# Patient Record
Sex: Female | Born: 1955 | Race: Black or African American | Hispanic: No | Smoking: Former smoker
Health system: Southern US, Community
[De-identification: ages and names within clinical notes are randomized; demographics above are authoritative.]

## PROBLEM LIST (undated history)

## (undated) ENCOUNTER — Emergency Department (HOSPITAL_COMMUNITY): Disposition: A | Discharge status: Home / Self Care | Payer: Medicare PPO

## (undated) DIAGNOSIS — K219 Gastro-esophageal reflux disease without esophagitis: Secondary | ICD-10-CM

## (undated) DIAGNOSIS — N189 Chronic kidney disease, unspecified: Secondary | ICD-10-CM

## (undated) DIAGNOSIS — N3010 Interstitial cystitis (chronic) without hematuria: Secondary | ICD-10-CM

## (undated) DIAGNOSIS — K3189 Other diseases of stomach and duodenum: Secondary | ICD-10-CM

## (undated) DIAGNOSIS — M7918 Myalgia, other site: Secondary | ICD-10-CM

## (undated) DIAGNOSIS — E669 Obesity, unspecified: Secondary | ICD-10-CM

## (undated) DIAGNOSIS — M722 Plantar fascial fibromatosis: Secondary | ICD-10-CM

## (undated) DIAGNOSIS — D509 Iron deficiency anemia, unspecified: Secondary | ICD-10-CM

## (undated) DIAGNOSIS — I2510 Atherosclerotic heart disease of native coronary artery without angina pectoris: Secondary | ICD-10-CM

## (undated) DIAGNOSIS — G5600 Carpal tunnel syndrome, unspecified upper limb: Secondary | ICD-10-CM

## (undated) DIAGNOSIS — M545 Low back pain, unspecified: Secondary | ICD-10-CM

## (undated) DIAGNOSIS — R109 Unspecified abdominal pain: Secondary | ICD-10-CM

## (undated) DIAGNOSIS — I10 Essential (primary) hypertension: Secondary | ICD-10-CM

## (undated) DIAGNOSIS — B3781 Candidal esophagitis: Secondary | ICD-10-CM

## (undated) DIAGNOSIS — E538 Deficiency of other specified B group vitamins: Secondary | ICD-10-CM

## (undated) DIAGNOSIS — B1920 Unspecified viral hepatitis C without hepatic coma: Secondary | ICD-10-CM

## (undated) DIAGNOSIS — H409 Unspecified glaucoma: Secondary | ICD-10-CM

## (undated) DIAGNOSIS — G8929 Other chronic pain: Secondary | ICD-10-CM

## (undated) DIAGNOSIS — K602 Anal fissure, unspecified: Secondary | ICD-10-CM

## (undated) DIAGNOSIS — E119 Type 2 diabetes mellitus without complications: Secondary | ICD-10-CM

## (undated) DIAGNOSIS — J45909 Unspecified asthma, uncomplicated: Secondary | ICD-10-CM

## (undated) DIAGNOSIS — E1169 Type 2 diabetes mellitus with other specified complication: Secondary | ICD-10-CM

## (undated) DIAGNOSIS — K922 Gastrointestinal hemorrhage, unspecified: Secondary | ICD-10-CM

## (undated) DIAGNOSIS — K319 Disease of stomach and duodenum, unspecified: Secondary | ICD-10-CM

## (undated) DIAGNOSIS — N390 Urinary tract infection, site not specified: Secondary | ICD-10-CM

## (undated) DIAGNOSIS — K589 Irritable bowel syndrome without diarrhea: Secondary | ICD-10-CM

## (undated) HISTORY — PX: BREAST SURGERY: SHX581

## (undated) HISTORY — PX: CHOLECYSTECTOMY: SHX55

## (undated) HISTORY — PX: REVISION UROSTOMY CUTANEOUS: SUR1282

## (undated) HISTORY — PX: ESOPHAGOGASTRODUODENOSCOPY: SHX1529

## (undated) HISTORY — PX: BILATERAL CARPAL TUNNEL RELEASE: SHX6508

## (undated) HISTORY — PX: COLONOSCOPY: SHX174

## (undated) HISTORY — PX: ABDOMINAL HYSTERECTOMY: SHX81

---

## 1997-10-27 ENCOUNTER — Encounter: Admission: RE | Admit: 1997-10-27 | Discharge: 1997-10-27 | Payer: Self-pay | Admitting: *Deleted

## 2007-05-02 ENCOUNTER — Emergency Department (HOSPITAL_COMMUNITY): Admission: EM | Admit: 2007-05-02 | Discharge: 2007-05-02 | Payer: Self-pay | Admitting: Certified Registered"

## 2007-05-02 IMAGING — CR DG HUMERUS 2V *R*
2 series · 2 of 2 positions shown · non-contrast
Comparison: none

CLINICAL DATA: Allergic reaction, pain in mid right humerus and swelling. 
RIGHT HUMERUS ? 2 VIEW:

[w humerus ap right *]
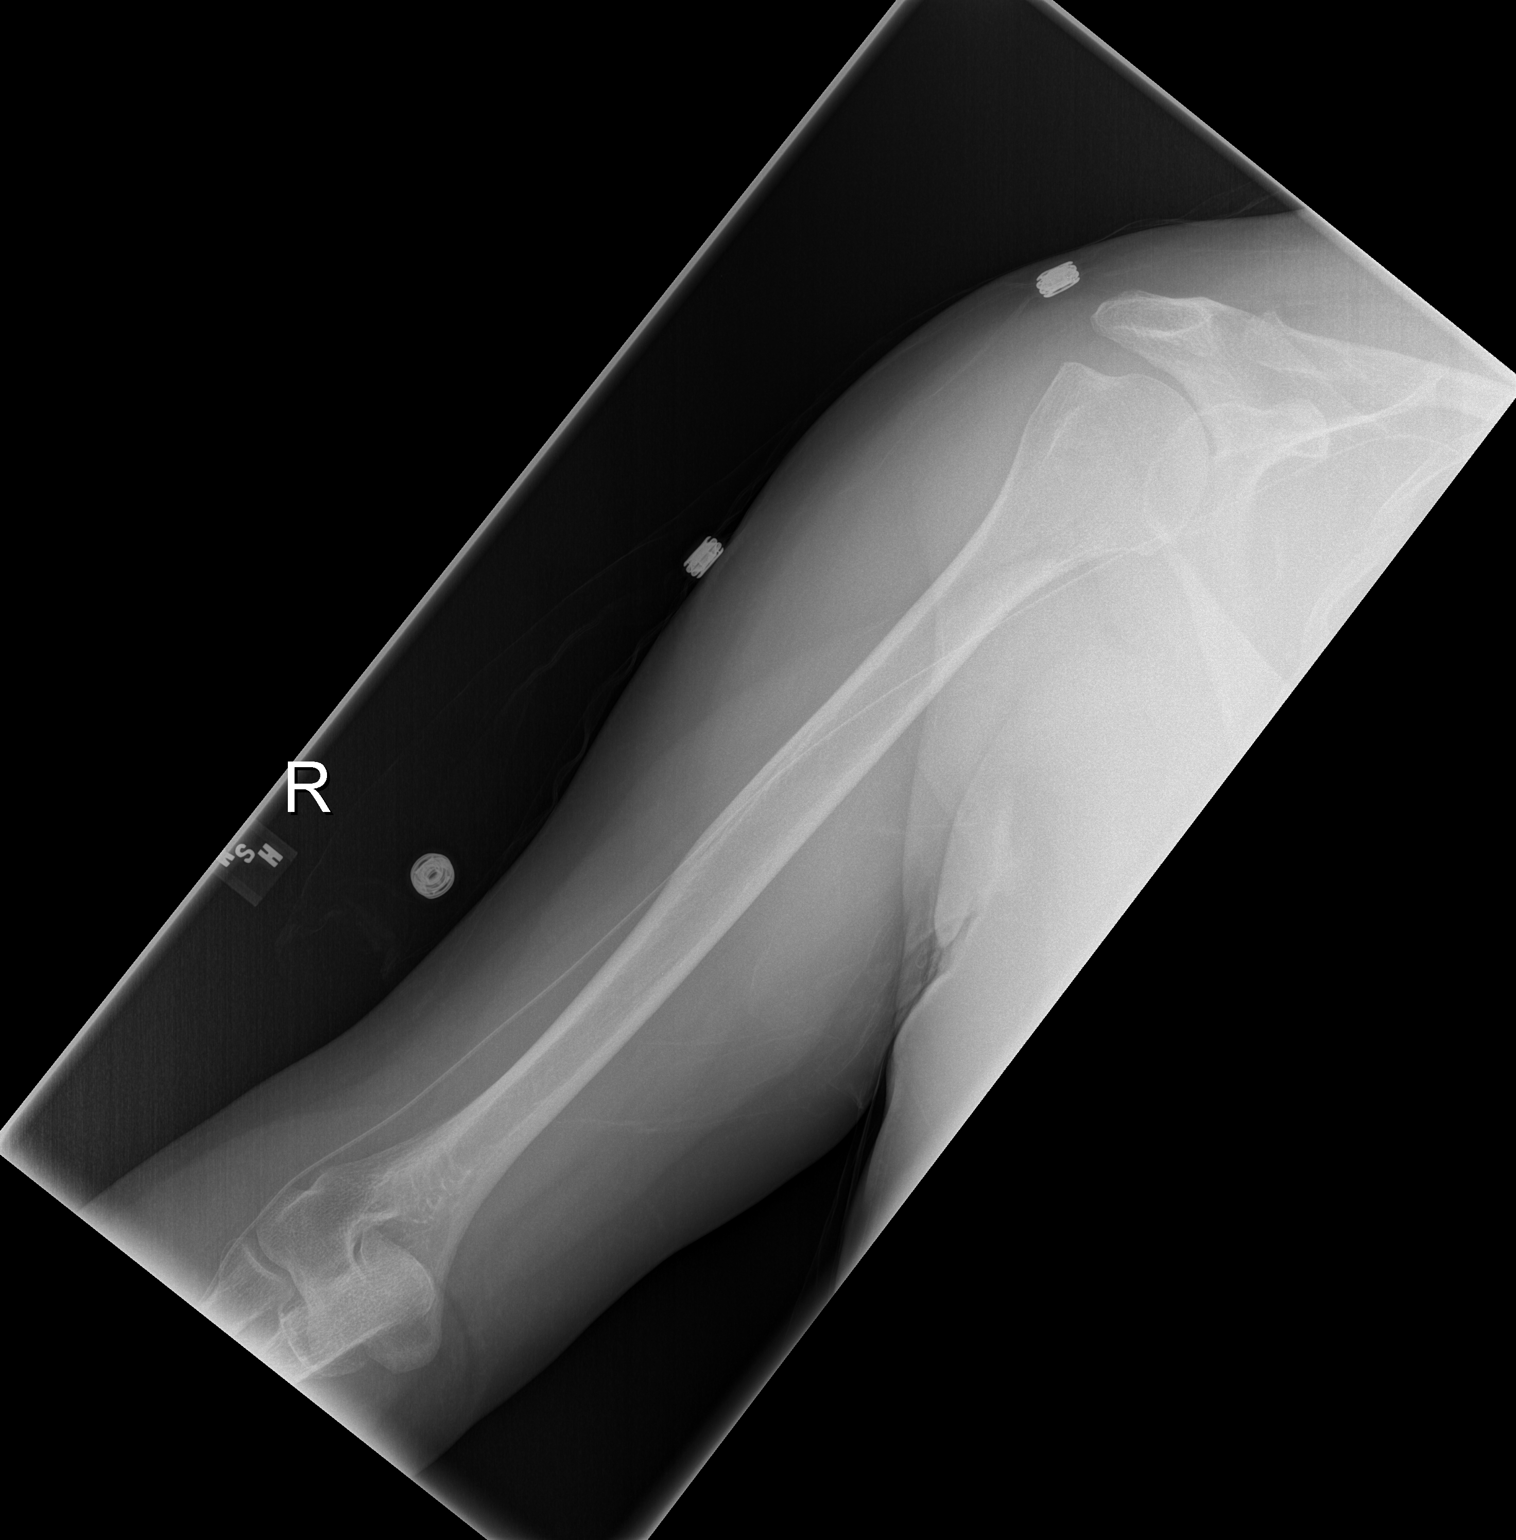

[w humerus lat right *]
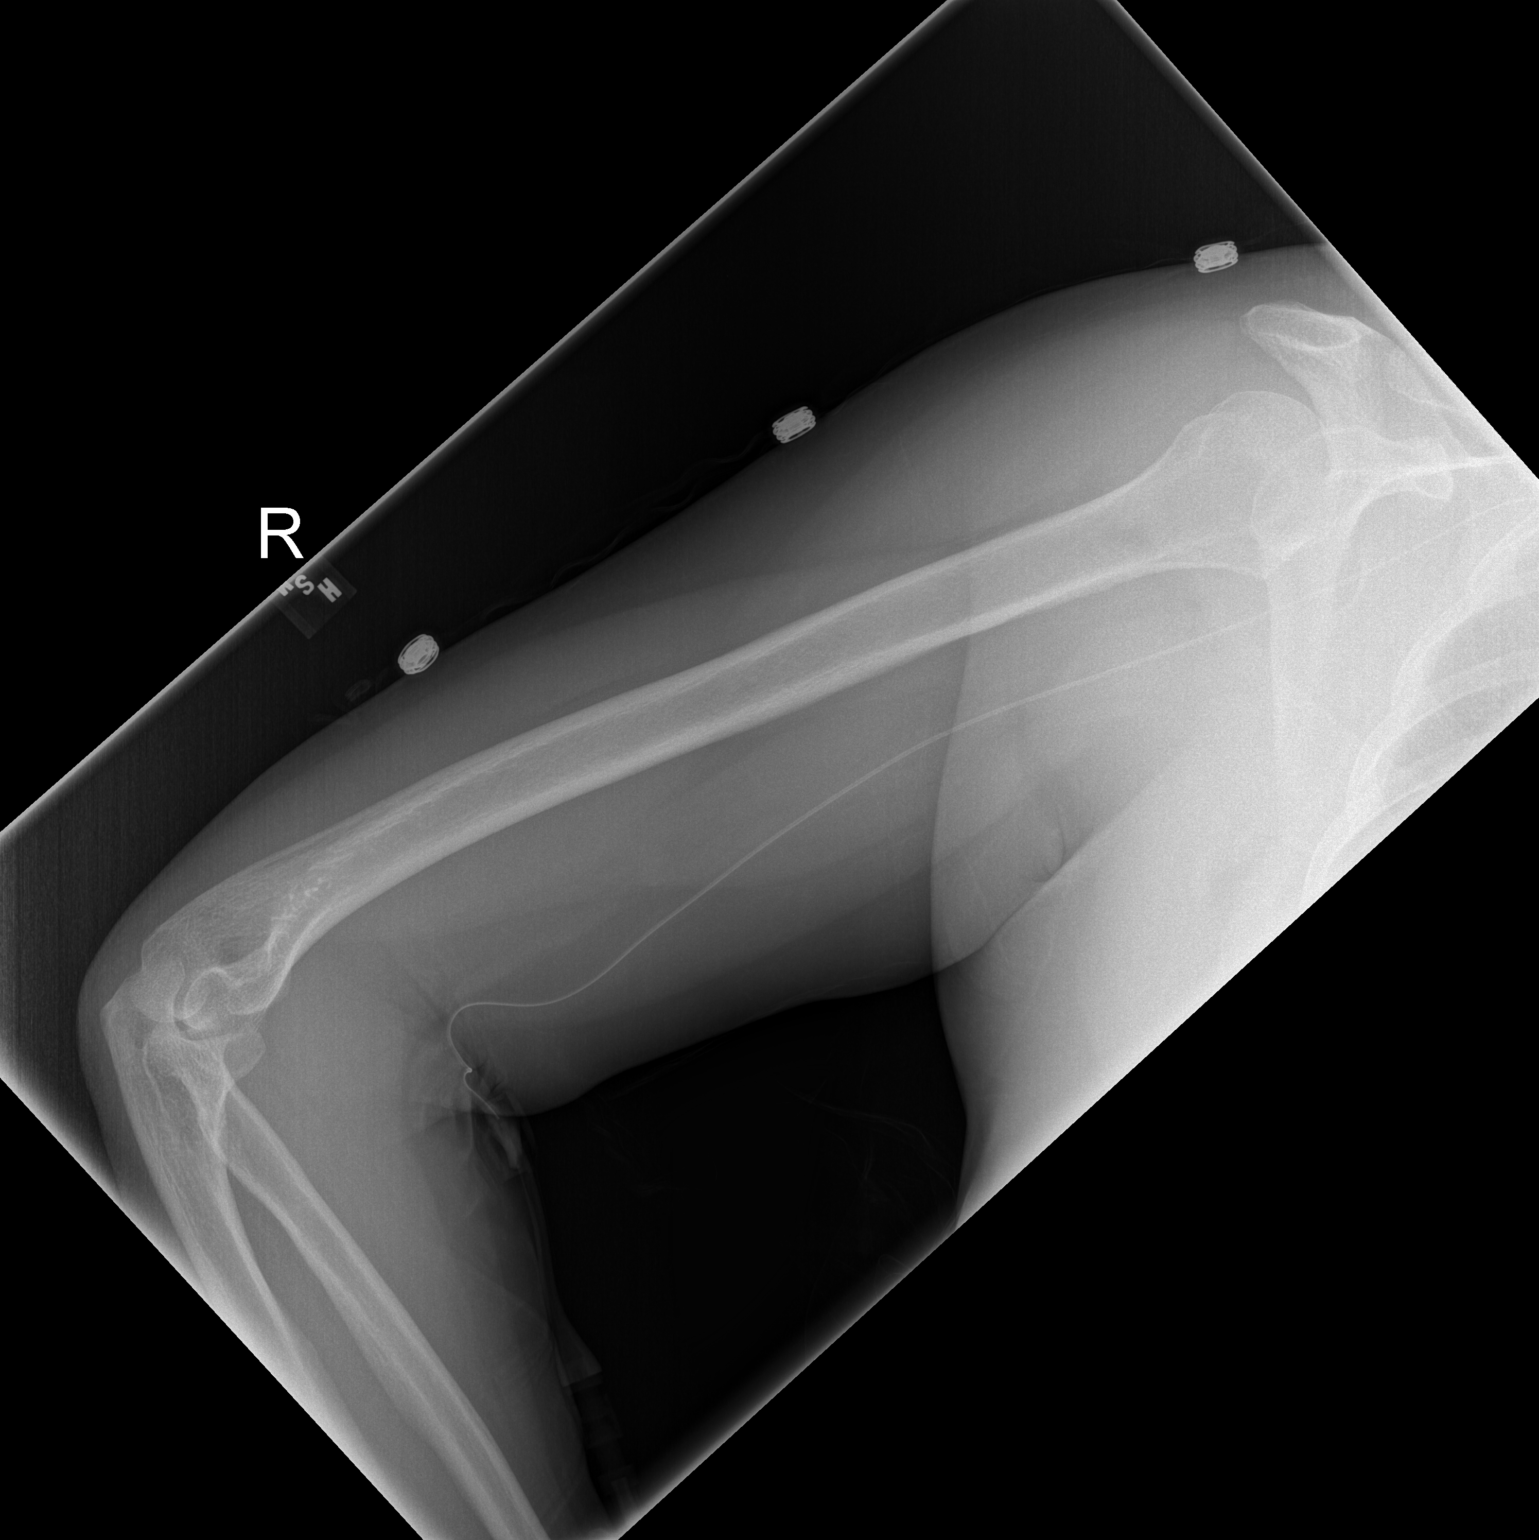

[2 of 2 positions shown; findings below may reference images not displayed]

FINDINGS: Two views of the right humerus show no acute fracture or dislocation.  A right subclavian PICC line is noted.
IMPRESSION: No right humerus acute fracture or subluxation.  Right arm / subclavian PICC line is noted.

## 2008-02-05 IMAGING — CR DG CHEST 2V
2 series · 2 of 2 positions shown · non-contrast
Comparison: There are no prior studies for comparison.

CLINICAL DATA: Allergic reaction, pain.  Shortness of breath 
 CHEST - 2 VIEW ? [DATE]:

[w chest pa]
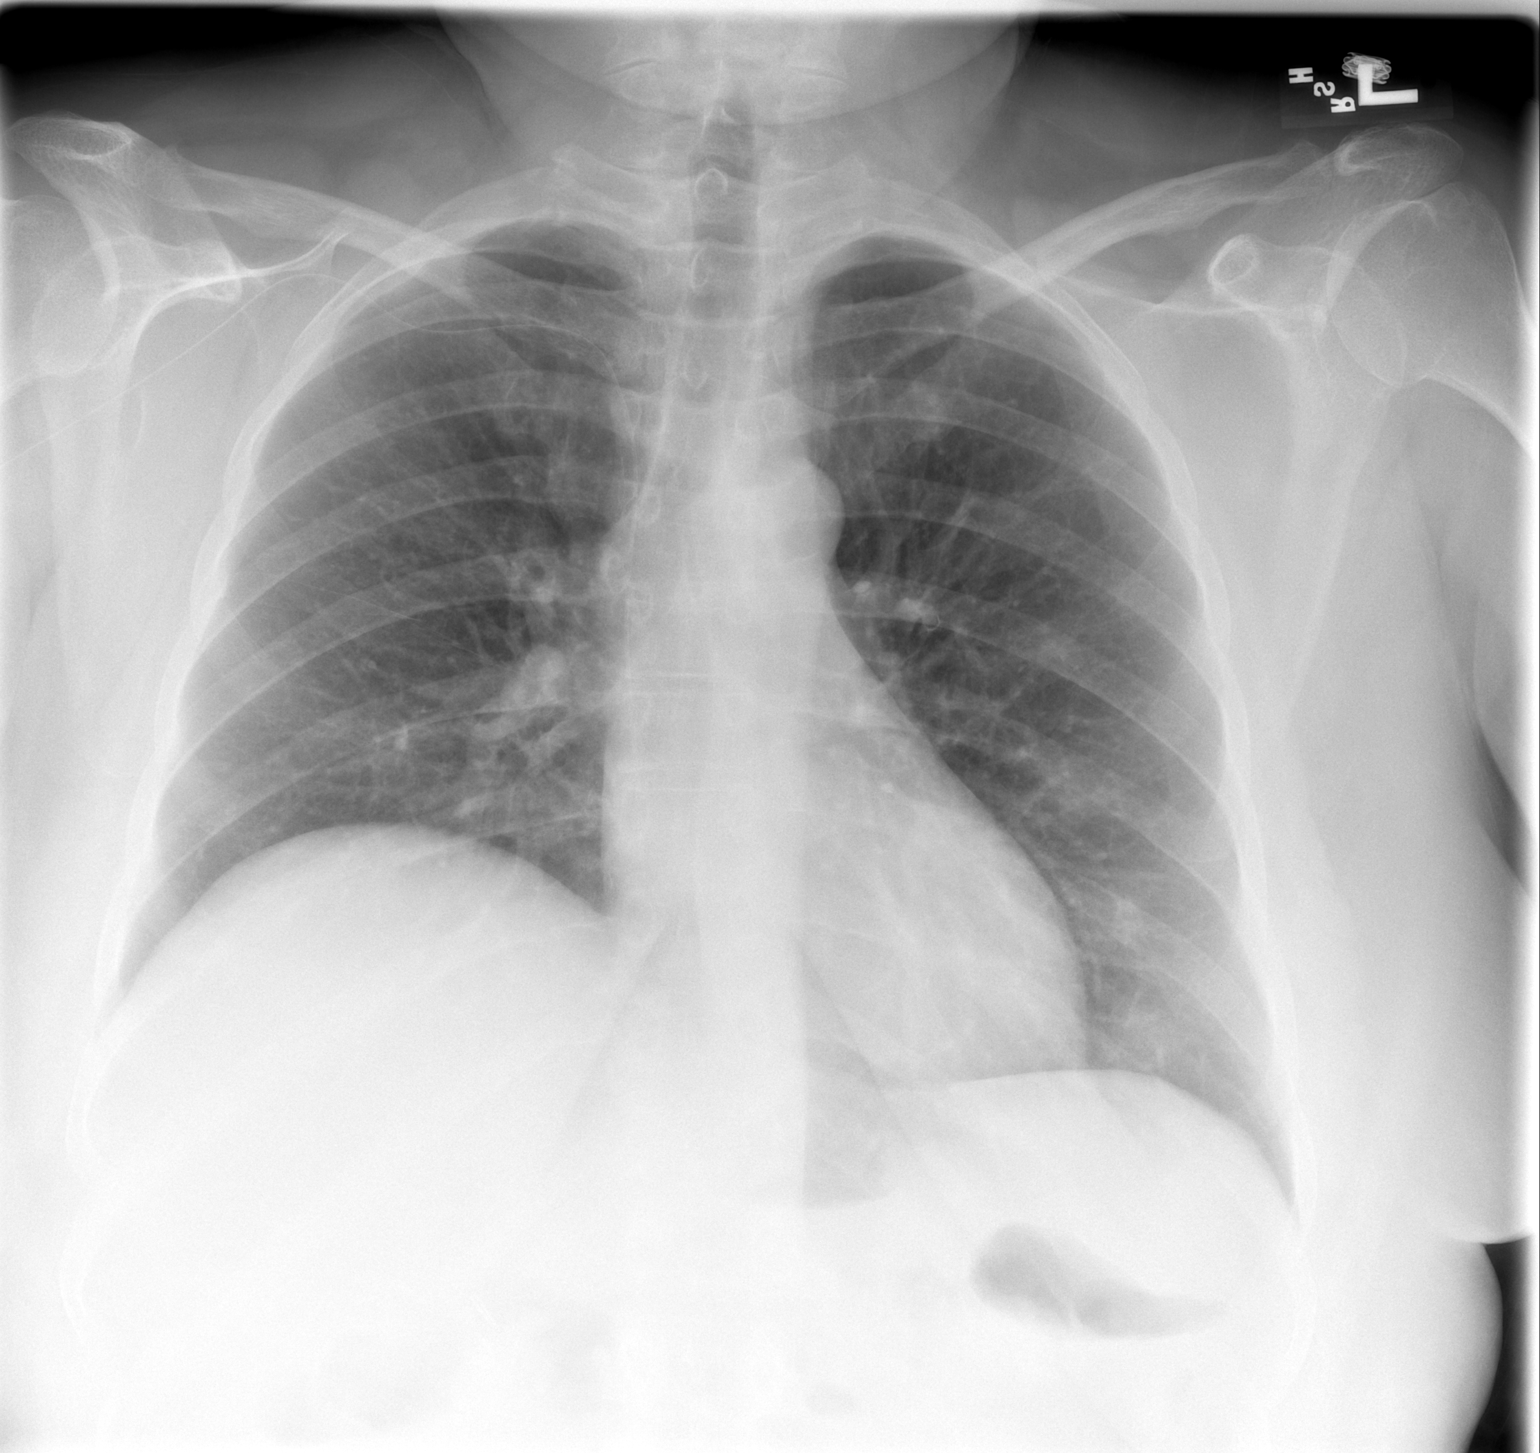

[w chest lat]
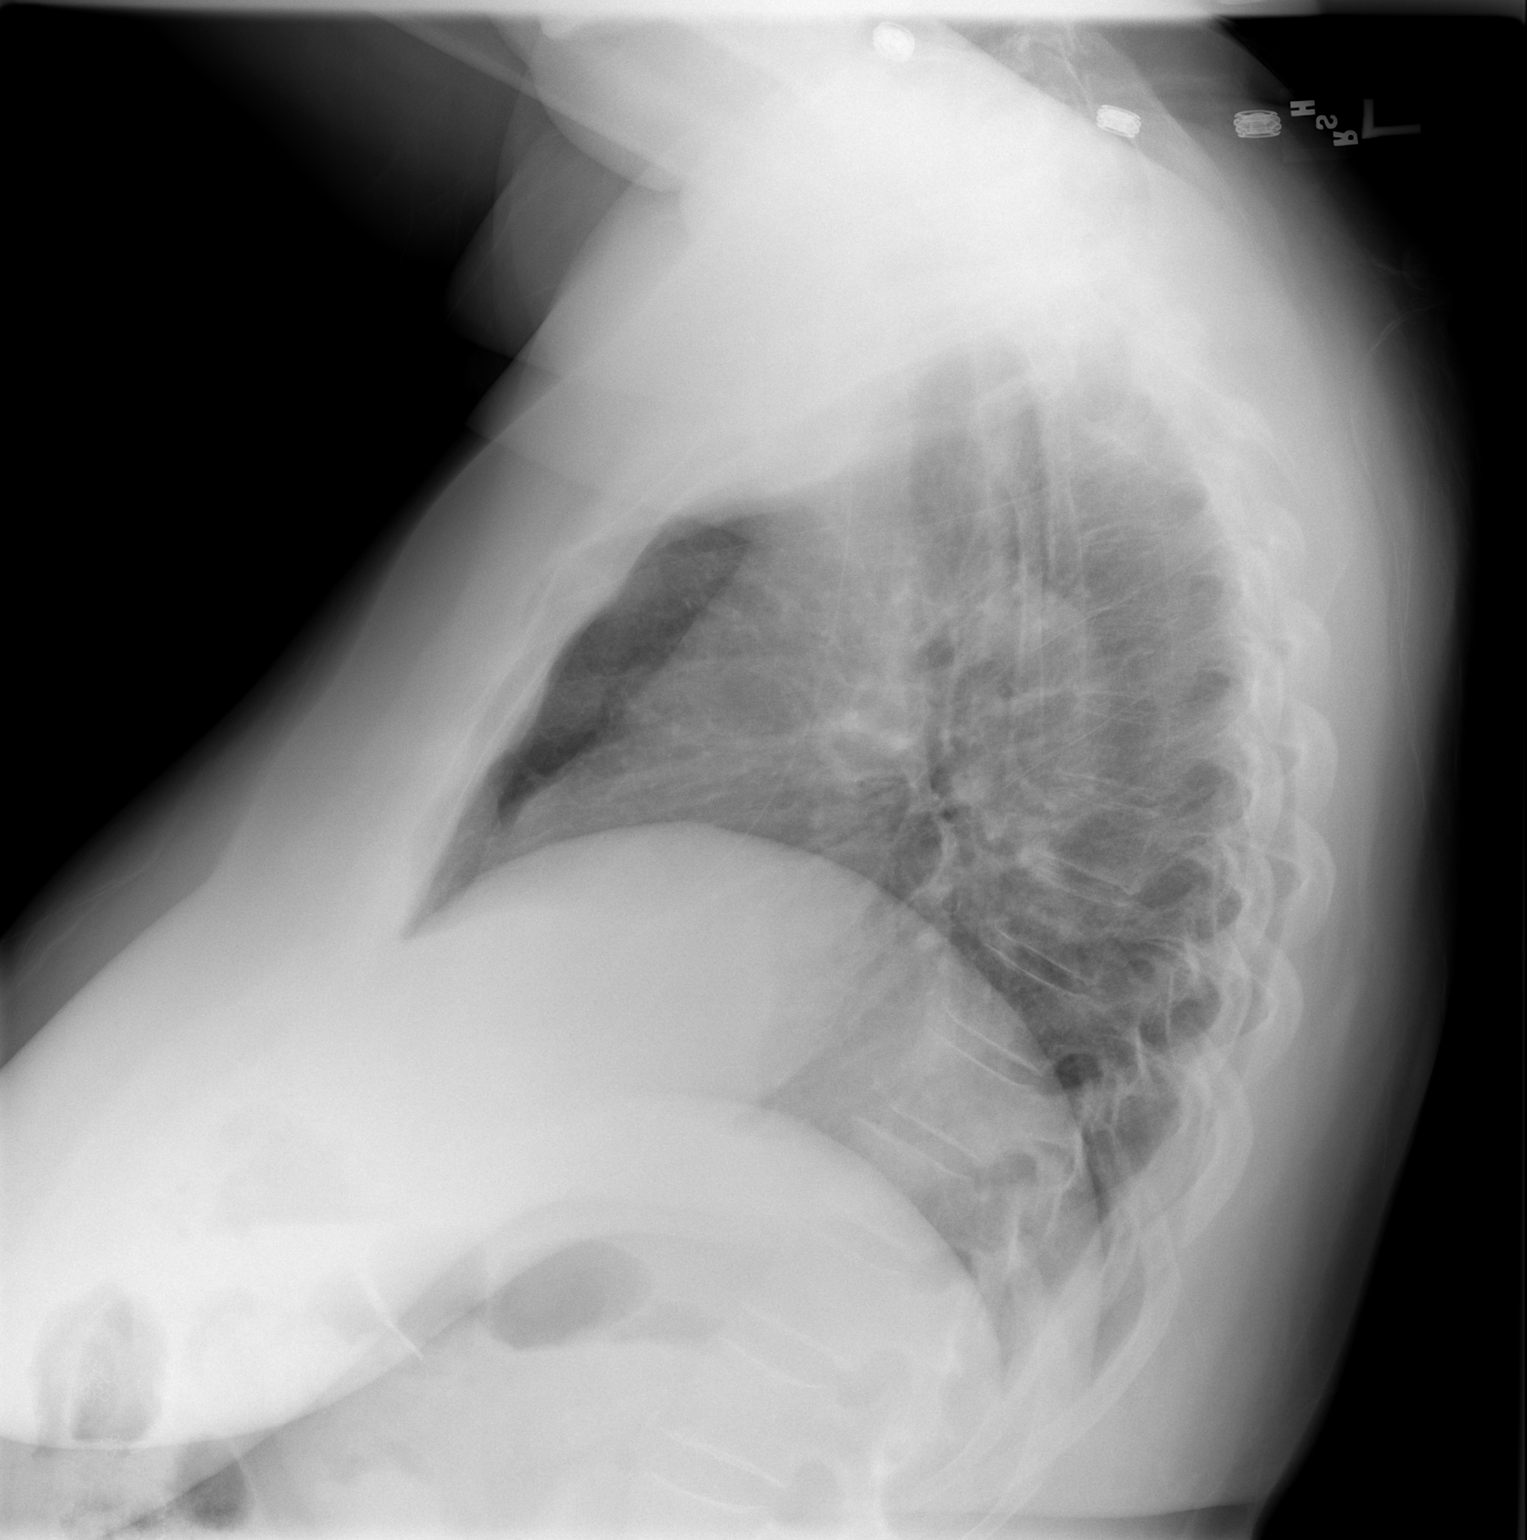

[2 of 2 positions shown; findings below may reference images not displayed]

FINDINGS: The cardiomediastinal silhouette is unremarkable.  There is elevation of the right hemidiaphragm.  There is no active infiltrate or pleural effusion.  The bony structures are unremarkable.
IMPRESSION: No active infiltrate or pleural effusion.  There is elevation of the right hemidiaphragm.

## 2014-12-28 ENCOUNTER — Encounter (HOSPITAL_COMMUNITY): Payer: Self-pay

## 2014-12-28 ENCOUNTER — Emergency Department (HOSPITAL_COMMUNITY)
Admission: EM | Admit: 2014-12-28 | Discharge: 2014-12-28 | Disposition: A | Discharge status: Home / Self Care | Payer: Medicare PPO | Attending: Emergency Medicine | Admitting: Emergency Medicine

## 2014-12-28 ENCOUNTER — Emergency Department (HOSPITAL_COMMUNITY): Payer: Medicare PPO

## 2014-12-28 DIAGNOSIS — Z794 Long term (current) use of insulin: Secondary | ICD-10-CM | POA: Diagnosis not present

## 2014-12-28 DIAGNOSIS — N189 Chronic kidney disease, unspecified: Secondary | ICD-10-CM | POA: Diagnosis not present

## 2014-12-28 DIAGNOSIS — N390 Urinary tract infection, site not specified: Secondary | ICD-10-CM | POA: Insufficient documentation

## 2014-12-28 DIAGNOSIS — K319 Disease of stomach and duodenum, unspecified: Secondary | ICD-10-CM | POA: Insufficient documentation

## 2014-12-28 DIAGNOSIS — Z8669 Personal history of other diseases of the nervous system and sense organs: Secondary | ICD-10-CM | POA: Insufficient documentation

## 2014-12-28 DIAGNOSIS — Z8619 Personal history of other infectious and parasitic diseases: Secondary | ICD-10-CM | POA: Diagnosis not present

## 2014-12-28 DIAGNOSIS — Z7982 Long term (current) use of aspirin: Secondary | ICD-10-CM | POA: Diagnosis not present

## 2014-12-28 DIAGNOSIS — G8929 Other chronic pain: Secondary | ICD-10-CM | POA: Insufficient documentation

## 2014-12-28 DIAGNOSIS — Z9049 Acquired absence of other specified parts of digestive tract: Secondary | ICD-10-CM | POA: Diagnosis not present

## 2014-12-28 DIAGNOSIS — Z90710 Acquired absence of both cervix and uterus: Secondary | ICD-10-CM | POA: Diagnosis not present

## 2014-12-28 DIAGNOSIS — J45909 Unspecified asthma, uncomplicated: Secondary | ICD-10-CM | POA: Diagnosis not present

## 2014-12-28 DIAGNOSIS — Z8739 Personal history of other diseases of the musculoskeletal system and connective tissue: Secondary | ICD-10-CM | POA: Insufficient documentation

## 2014-12-28 DIAGNOSIS — E669 Obesity, unspecified: Secondary | ICD-10-CM | POA: Diagnosis not present

## 2014-12-28 DIAGNOSIS — Z87891 Personal history of nicotine dependence: Secondary | ICD-10-CM | POA: Diagnosis not present

## 2014-12-28 DIAGNOSIS — Z862 Personal history of diseases of the blood and blood-forming organs and certain disorders involving the immune mechanism: Secondary | ICD-10-CM | POA: Insufficient documentation

## 2014-12-28 DIAGNOSIS — E1169 Type 2 diabetes mellitus with other specified complication: Secondary | ICD-10-CM | POA: Insufficient documentation

## 2014-12-28 DIAGNOSIS — Z79899 Other long term (current) drug therapy: Secondary | ICD-10-CM | POA: Insufficient documentation

## 2014-12-28 DIAGNOSIS — Z7951 Long term (current) use of inhaled steroids: Secondary | ICD-10-CM | POA: Diagnosis not present

## 2014-12-28 DIAGNOSIS — K5712 Diverticulitis of small intestine without perforation or abscess without bleeding: Secondary | ICD-10-CM | POA: Diagnosis not present

## 2014-12-28 DIAGNOSIS — I129 Hypertensive chronic kidney disease with stage 1 through stage 4 chronic kidney disease, or unspecified chronic kidney disease: Secondary | ICD-10-CM | POA: Diagnosis not present

## 2014-12-28 DIAGNOSIS — R109 Unspecified abdominal pain: Secondary | ICD-10-CM | POA: Diagnosis present

## 2014-12-28 DIAGNOSIS — E538 Deficiency of other specified B group vitamins: Secondary | ICD-10-CM | POA: Insufficient documentation

## 2014-12-28 DIAGNOSIS — K219 Gastro-esophageal reflux disease without esophagitis: Secondary | ICD-10-CM | POA: Insufficient documentation

## 2014-12-28 DIAGNOSIS — I2510 Atherosclerotic heart disease of native coronary artery without angina pectoris: Secondary | ICD-10-CM | POA: Diagnosis not present

## 2014-12-28 HISTORY — DX: Anal fissure, unspecified: K60.2

## 2014-12-28 HISTORY — DX: Unspecified viral hepatitis C without hepatic coma: B19.20

## 2014-12-28 HISTORY — DX: Gastrointestinal hemorrhage, unspecified: K92.2

## 2014-12-28 HISTORY — DX: Other diseases of stomach and duodenum: K31.89

## 2014-12-28 HISTORY — DX: Chronic kidney disease, unspecified: N18.9

## 2014-12-28 HISTORY — DX: Other chronic pain: G89.29

## 2014-12-28 HISTORY — DX: Type 2 diabetes mellitus without complications: E11.9

## 2014-12-28 HISTORY — DX: Urinary tract infection, site not specified: N39.0

## 2014-12-28 HISTORY — DX: Iron deficiency anemia, unspecified: D50.9

## 2014-12-28 HISTORY — DX: Unspecified glaucoma: H40.9

## 2014-12-28 HISTORY — DX: Deficiency of other specified B group vitamins: E53.8

## 2014-12-28 HISTORY — DX: Type 2 diabetes mellitus with other specified complication: E11.69

## 2014-12-28 HISTORY — DX: Disease of stomach and duodenum, unspecified: K31.9

## 2014-12-28 HISTORY — DX: Irritable bowel syndrome, unspecified: K58.9

## 2014-12-28 HISTORY — DX: Carpal tunnel syndrome, unspecified upper limb: G56.00

## 2014-12-28 HISTORY — DX: Unspecified asthma, uncomplicated: J45.909

## 2014-12-28 HISTORY — DX: Gastro-esophageal reflux disease without esophagitis: K21.9

## 2014-12-28 HISTORY — DX: Plantar fascial fibromatosis: M72.2

## 2014-12-28 HISTORY — DX: Myalgia, other site: M79.18

## 2014-12-28 HISTORY — DX: Essential (primary) hypertension: I10

## 2014-12-28 HISTORY — DX: Low back pain: M54.5

## 2014-12-28 HISTORY — DX: Unspecified abdominal pain: R10.9

## 2014-12-28 HISTORY — DX: Interstitial cystitis (chronic) without hematuria: N30.10

## 2014-12-28 HISTORY — DX: Obesity, unspecified: E66.9

## 2014-12-28 HISTORY — DX: Candidal esophagitis: B37.81

## 2014-12-28 HISTORY — DX: Low back pain, unspecified: M54.50

## 2014-12-28 HISTORY — DX: Atherosclerotic heart disease of native coronary artery without angina pectoris: I25.10

## 2014-12-28 LAB — URINALYSIS, ROUTINE W REFLEX MICROSCOPIC
BILIRUBIN URINE: NEGATIVE
Glucose, UA: NEGATIVE mg/dL
KETONES UR: NEGATIVE mg/dL
NITRITE: NEGATIVE
Protein, ur: 30 mg/dL — AB
Specific Gravity, Urine: 1.014 (ref 1.005–1.030)
UROBILINOGEN UA: 0.2 mg/dL (ref 0.0–1.0)
pH: 6.5 (ref 5.0–8.0)

## 2014-12-28 LAB — CBC
HCT: 37.9 % (ref 36.0–46.0)
HEMOGLOBIN: 12.5 g/dL (ref 12.0–15.0)
MCH: 32.3 pg (ref 26.0–34.0)
MCHC: 33 g/dL (ref 30.0–36.0)
MCV: 97.9 fL (ref 78.0–100.0)
PLATELETS: 406 10*3/uL — AB (ref 150–400)
RBC: 3.87 MIL/uL (ref 3.87–5.11)
RDW: 13.3 % (ref 11.5–15.5)
WBC: 10.2 10*3/uL (ref 4.0–10.5)

## 2014-12-28 LAB — COMPREHENSIVE METABOLIC PANEL
ALK PHOS: 67 U/L (ref 38–126)
ALT: 20 U/L (ref 14–54)
ANION GAP: 9 (ref 5–15)
AST: 22 U/L (ref 15–41)
Albumin: 4 g/dL (ref 3.5–5.0)
BUN: 13 mg/dL (ref 6–20)
CALCIUM: 9.4 mg/dL (ref 8.9–10.3)
CHLORIDE: 105 mmol/L (ref 101–111)
CO2: 27 mmol/L (ref 22–32)
Creatinine, Ser: 1.18 mg/dL — ABNORMAL HIGH (ref 0.44–1.00)
GFR, EST AFRICAN AMERICAN: 57 mL/min — AB (ref >60)
GFR, EST NON AFRICAN AMERICAN: 49 mL/min — AB (ref >60)
Glucose, Bld: 156 mg/dL — ABNORMAL HIGH (ref 65–99)
Potassium: 3.8 mmol/L (ref 3.5–5.1)
SODIUM: 141 mmol/L (ref 135–145)
Total Bilirubin: 0.5 mg/dL (ref 0.3–1.2)
Total Protein: 7.9 g/dL (ref 6.5–8.1)

## 2014-12-28 LAB — URINE MICROSCOPIC-ADD ON

## 2014-12-28 LAB — LIPASE, BLOOD: LIPASE: 28 U/L (ref 22–51)

## 2014-12-28 IMAGING — CT CT ABD-PELV W/O CM
2 of 3 series · 16 of 44 positions shown, 18 images · non-contrast
Comparison: None

CLINICAL DATA: RIGHT flank pain radiating to RIGHT abdomen with
nausea for 2 days, interstitial cystitis post cystectomy with
urinary diversion, diabetes mellitus, irritable bowel syndrome,
hypertension, hepatitis-C, asthma, former smoker

EXAM:
CT ABDOMEN AND PELVIS WITHOUT CONTRAST
TECHNIQUE: Multidetector CT imaging of the abdomen and pelvis was performed
following the standard protocol without IV contrast. Sagittal and
coronal MPR images reconstructed from axial data set. Oral contrast
not administered.

[Series 4: lung · axial · 0.78mm/px · z∈[+1256,+1380]mm · 13 of 30 slices shown, 15 images]
[im 3/30  soft-tissue]
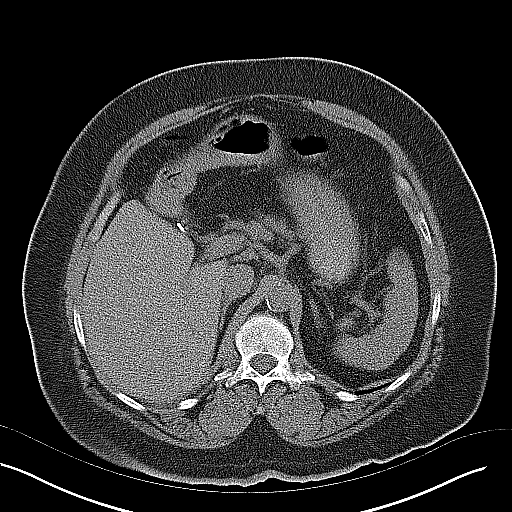
[im 3/30  bone]
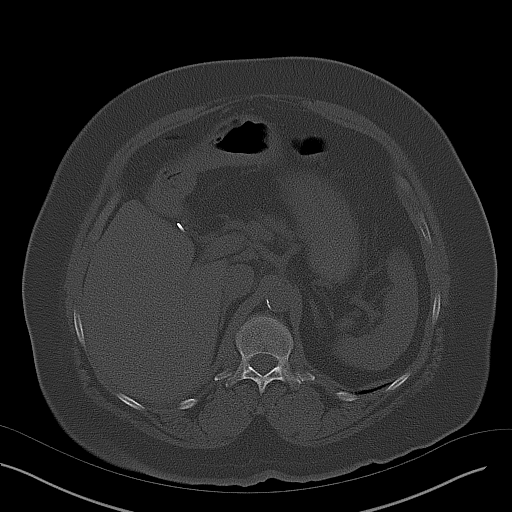
[im 5/30  soft-tissue]
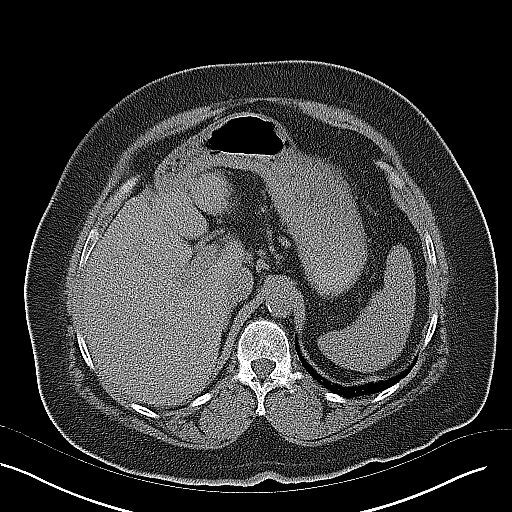
[im 7/30  soft-tissue]
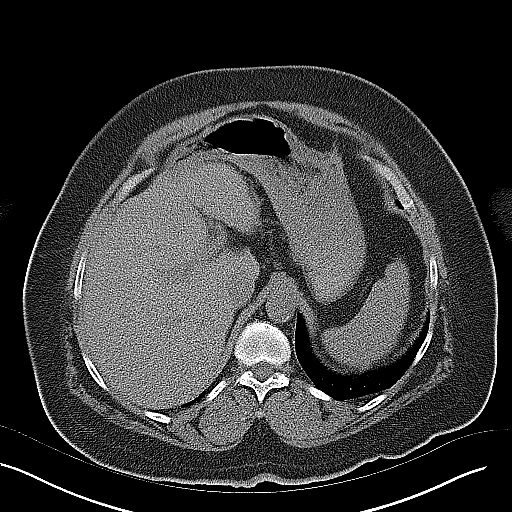
[im 9/30  soft-tissue]
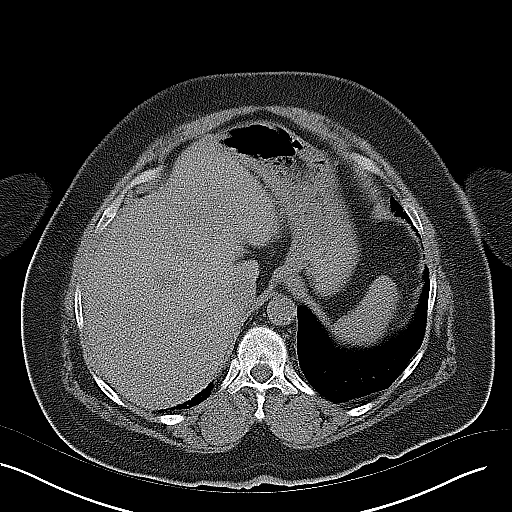
[im 11/30  soft-tissue]
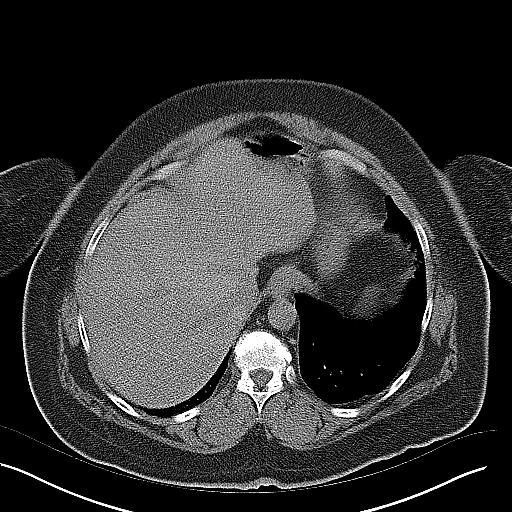
[im 13/30  soft-tissue]
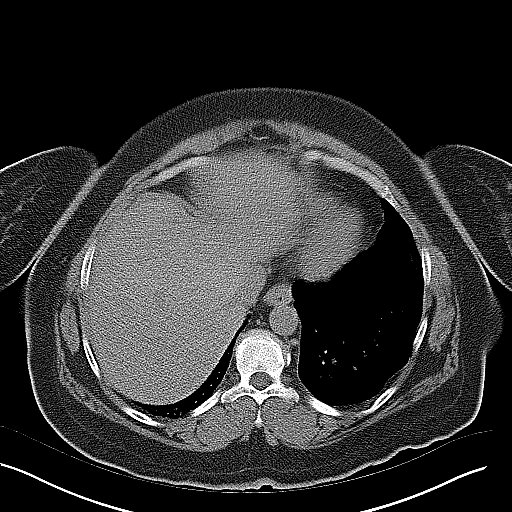
[im 16/30  soft-tissue]
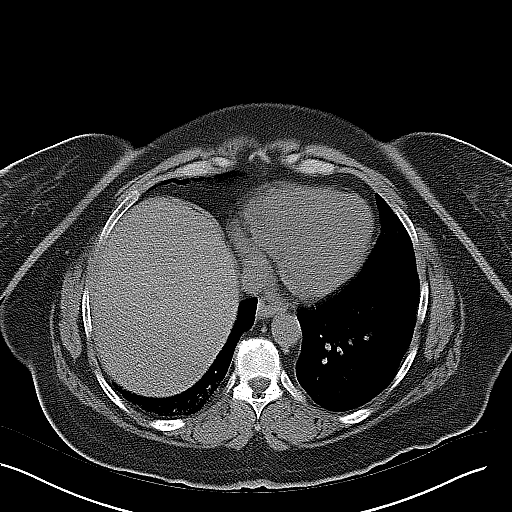
[im 18/30  soft-tissue]
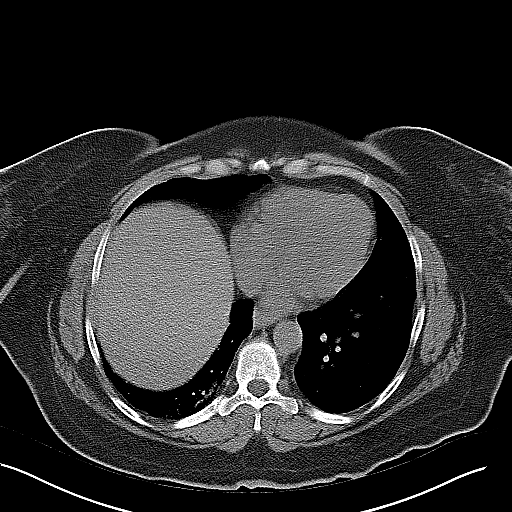
[im 20/30  soft-tissue]
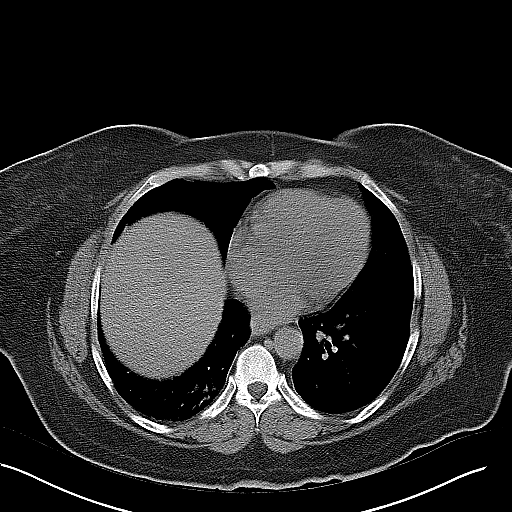
[im 20/30  bone]
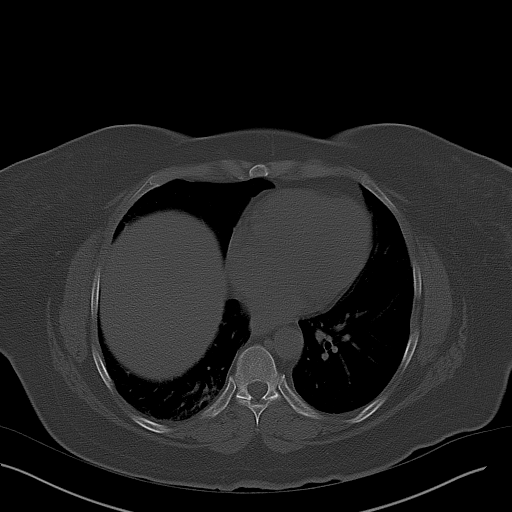
[im 22/30  soft-tissue]
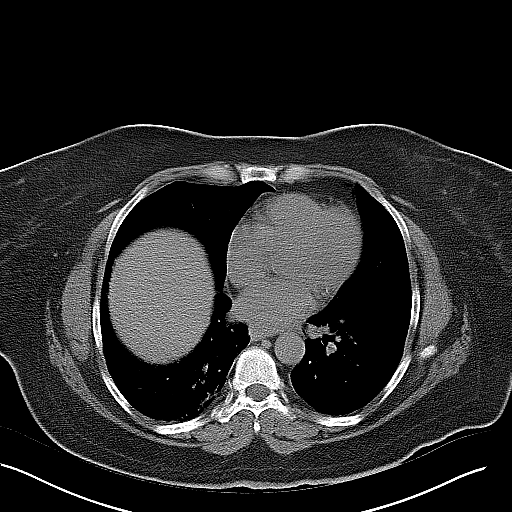
[im 24/30  soft-tissue]
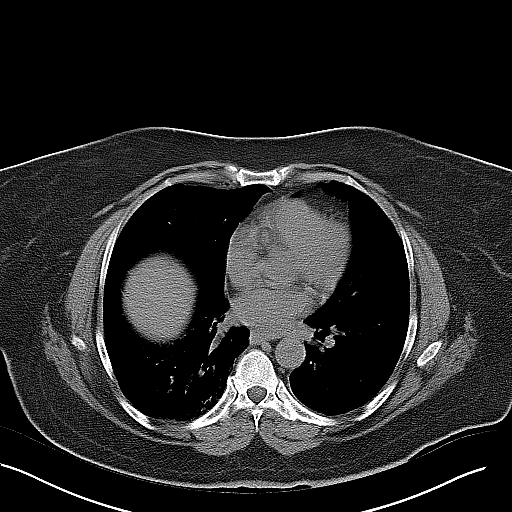
[im 26/30  soft-tissue]
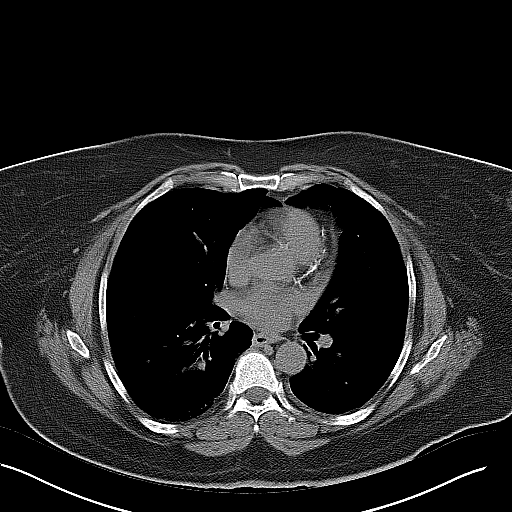
[im 28/30  soft-tissue]
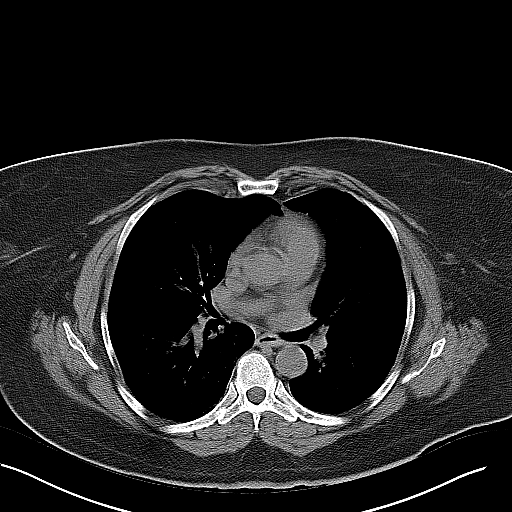

[Series 5: coronal · coronal · 0.78mm/px · 3 of 119 slices shown]
[im 40/119  soft-tissue]
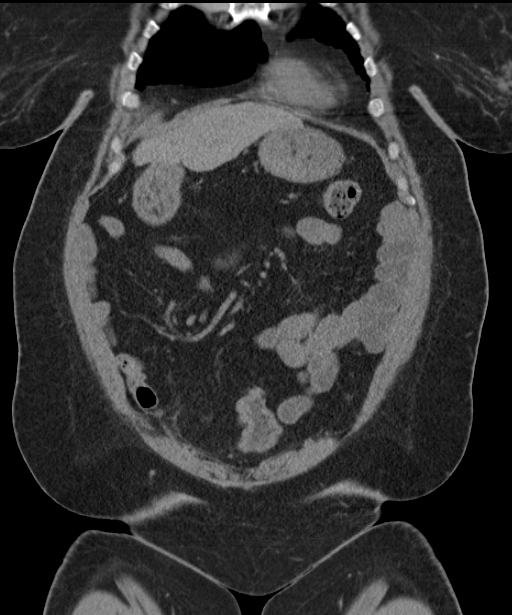
[im 53/119  soft-tissue]
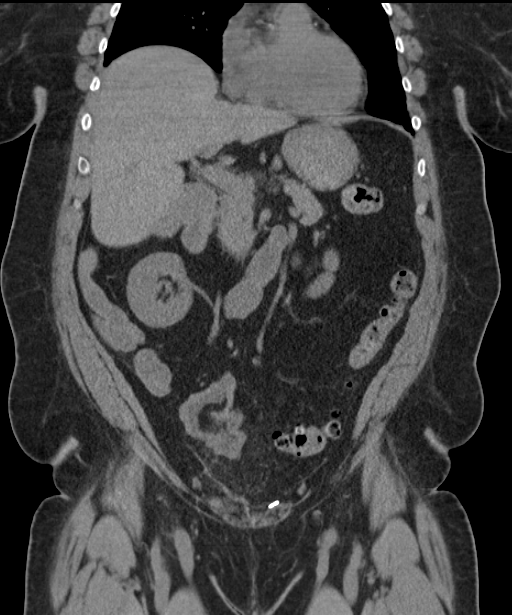
[im 66/119  soft-tissue]
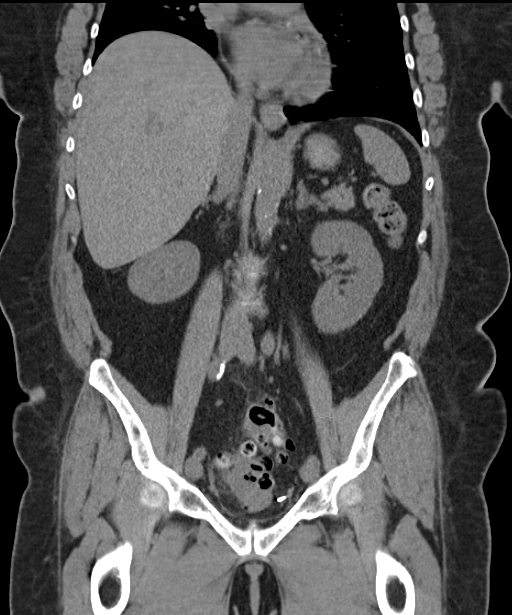

[16 of 44 positions shown; findings below may reference images not displayed]

FINDINGS: Subsegmental atelectasis dependently RIGHT lower lobe.

Within limits of a nonenhanced exam, liver, spleen, pancreas,
kidneys, and adrenal glands normal appearance.

No urinary tract calcification, hydronephrosis or ureteral
dilatation.

Gallbladder surgically absent.

Prior cystectomy with urinary diversion in pelvis, ostomy RIGHT
lower quadrant.

Calcifications are seen at the wall of the urostomy.

Stomach and small bowel loops grossly unremarkable.

Diverticulosis of descending and sigmoid colon with sigmoid wall
thickening and pericolic inflammatory changes consistent with distal
sigmoid diverticulitis.

No gross evidence of perforation or abscess.

Unable to exclude underlying colonic abnormalities in the setting of
acute diverticulitis with colonic wall thickening.

No mass, adenopathy, free air or ascites.

LEFT inguinal hernia containing fat.

Bones demineralized.

Scattered atherosclerotic calcifications.

Probable discogenic sclerosis at the superior anterior aspects of
the T10 and T11 vertebral bodies.
IMPRESSION: Acute sigmoid diverticulitis without evidence of perforation or
abscess.

Followup colonoscopy recommended following resolution of the
patient's acute inflammatory process to exclude underlying
abnormalities/tumor at the sigmoid colon.

Post cystectomy with urinary diversion/ostomy in RIGHT lower
quadrant.

LEFT inguinal hernia containing fat.

## 2014-12-28 MED ORDER — FENTANYL CITRATE (PF) 100 MCG/2ML IJ SOLN
100.0000 ug | Freq: Once | INTRAMUSCULAR | Status: AC
  Administered 2014-12-28: 100 ug via INTRAVENOUS
  Filled 2014-12-28: qty 2

## 2014-12-28 MED ORDER — METRONIDAZOLE 500 MG PO TABS: 500.0000 mg | ORAL_TABLET | Freq: Two times a day (BID) | ORAL | Status: DC

## 2014-12-28 MED ORDER — CEPHALEXIN 500 MG PO CAPS: 1000.0000 mg | ORAL_CAPSULE | Freq: Three times a day (TID) | ORAL | Status: DC

## 2014-12-28 MED ORDER — PIPERACILLIN-TAZOBACTAM 3.375 G IVPB 30 MIN
3.3750 g | Freq: Once | INTRAVENOUS | Status: AC
  Administered 2014-12-28: 3.375 g via INTRAVENOUS
  Filled 2014-12-28: qty 50

## 2014-12-28 MED ORDER — DIPHENHYDRAMINE HCL 25 MG PO CAPS
25.0000 mg | ORAL_CAPSULE | Freq: Once | ORAL | Status: AC
  Administered 2014-12-28: 25 mg via ORAL
  Filled 2014-12-28: qty 1

## 2014-12-28 MED ORDER — METRONIDAZOLE 500 MG PO TABS
500.0000 mg | ORAL_TABLET | Freq: Once | ORAL | Status: AC
  Administered 2014-12-28: 500 mg via ORAL
  Filled 2014-12-28: qty 1

## 2014-12-28 MED ORDER — OXYCODONE-ACETAMINOPHEN 5-325 MG PO TABS
2.0000 | ORAL_TABLET | Freq: Once | ORAL | Status: AC
  Administered 2014-12-28: 2 via ORAL
  Filled 2014-12-28: qty 2

## 2014-12-28 MED ORDER — OXYCODONE-ACETAMINOPHEN 5-325 MG PO TABS: 1.0000 | ORAL_TABLET | Freq: Four times a day (QID) | ORAL | Status: DC | PRN

## 2014-12-28 MED ORDER — SODIUM CHLORIDE 0.9 % IV BOLUS (SEPSIS)
1000.0000 mL | Freq: Once | INTRAVENOUS | Status: AC
Start: 2014-12-28 — End: 2014-12-28
  Administered 2014-12-28: 1000 mL via INTRAVENOUS

## 2014-12-28 MED ORDER — FENTANYL CITRATE (PF) 100 MCG/2ML IJ SOLN
50.0000 ug | Freq: Once | INTRAMUSCULAR | Status: AC
  Administered 2014-12-28: 50 ug via INTRAVENOUS
  Filled 2014-12-28: qty 2

## 2014-12-28 NOTE — ED Notes (Signed)
Pt reports right flank pain. Pt caths self through urostomy stoma. She reports PMH of interstitial cystitis.

## 2014-12-28 NOTE — Discharge Instructions (Signed)
Take keflex and flagyl as prescribed for 2 weeks.   Stay hydrated.   Take percocet for severe pain. You can take benadryl if you have itchiness.   See your doctor this week.   Return to ER if you have fever, vomiting, uncontrolled pain.

## 2014-12-28 NOTE — ED Provider Notes (Signed)
CSN: [id number]     Arrival date & time 12/28/14  1407 History   First MD Initiated Contact with Patient 12/28/14 1905     Chief Complaint  Patient presents with  . Flank Pain  . Abdominal Pain  . Nausea     (Consider location/radiation/quality/duration/timing/severity/associated sxs/prior Treatment) The history is provided by the patient.  Azlee Magie Ciampa is a 59 y.o. female history diabetes, CK D, hypertension, chronic UTI with a urostomy here presenting with abdominal pain. She has right flank pain radiated to the right lower quadrant for the last 2 days. States that the pain is constant. Denies any vomiting and feeling nauseated. Denies any constipation or diarrhea. Denies any fevers. She usually self cath through the urostomy stoma. States that the urine has been relatively clear and she doesn't notice any blood in urine.     Past Medical History  Diagnosis Date  . Diabetes mellitus without complication   . IBS (irritable bowel syndrome)   . Interstitial cystitis   . Myofascial pain syndrome   . Carpal tunnel syndrome   . GI bleed   . Coronary artery disease   . B12 deficiency   . CKD (chronic kidney disease)   . Hypertension   . Iron deficiency anemia   . Glaucoma   . Hepatitis C   . Anal fissure   . Candidal esophagitis   . Obesity (BMI 35.0-39.9 without comorbidity)   . Diabetic gastropathy   . Plantar fasciitis   . GERD (gastroesophageal reflux disease)   . Asthma   . Chronic UTI   . Chronic abdominal pain   . Episodic low back pain    Past Surgical History  Procedure Laterality Date  . Revision urostomy cutaneous    . Abdominal hysterectomy    . Cholecystectomy    . Bilateral carpal tunnel release    . Breast surgery     History reviewed. No pertinent family history. Social History  Substance Use Topics  . Smoking status: Former Games developer  . Smokeless tobacco: None  . Alcohol Use: No   OB History    No data available     Review of Systems   Gastrointestinal: Positive for abdominal pain.  Genitourinary: Positive for flank pain.  All other systems reviewed and are negative.     Allergies  Celebrex; Ciprofloxacin; Cymbalta; Dilaudid; Diuretic; Glucophage; Lyrica; Oxycodone; Morphine and related; Sulfa antibiotics; and Vicodin  Home Medications   Prior to Admission medications   Medication Sig Start Date End Date Taking? Authorizing Provider  albuterol (PROVENTIL HFA;VENTOLIN HFA) 108 (90 BASE) MCG/ACT inhaler Inhale 2 puffs into the lungs every 4 (four) hours as needed for wheezing or shortness of breath.   Yes Historical Provider, MD  amLODipine (NORVASC) 5 MG tablet Take 5 mg by mouth daily.   Yes Historical Provider, MD  aspirin EC 81 MG tablet Take 81 mg by mouth daily.   Yes Historical Provider, MD  budesonide-formoterol (SYMBICORT) 80-4.5 MCG/ACT inhaler Inhale 2 puffs into the lungs 2 (two) times daily.   Yes Historical Provider, MD  cholecalciferol (VITAMIN D) 1000 UNITS tablet Take 1,000 Units by mouth daily.   Yes Historical Provider, MD  colestipol (COLESTID) 1 G tablet Take 1 g by mouth daily.    Yes Historical Provider, MD  cyanocobalamin 1000 MCG tablet Take 1,000 mcg by mouth daily.    Yes Historical Provider, MD  FLUoxetine (PROZAC) 20 MG capsule Take 40 mg by mouth daily.   Yes Historical Provider, MD  fluticasone (FLONASE) 50 MCG/ACT nasal spray Place 2 sprays into both nostrils daily as needed for allergies or rhinitis.   Yes Historical Provider, MD  gabapentin (NEURONTIN) 300 MG capsule Take 600 mg by mouth 2 (two) times daily.   Yes Historical Provider, MD  glucagon (GLUCAGON EMERGENCY) 1 MG injection Inject 1 mg into the vein once as needed (severe insulin reaction).   Yes Historical Provider, MD  hydrocortisone (ANUSOL-HC) 2.5 % rectal cream Place 1 application rectally 3 (three) times daily as needed for hemorrhoids.    Yes Historical Provider, MD  insulin regular human CONCENTRATED (HUMULIN R) 500  UNIT/ML injection Inject 20-28 Units into the skin 3 (three) times daily. 26-28 marks (120 U) breakfast, 24-26 marks (110 U) lunch, 20 mark (90 U) dinner.   Yes Historical Provider, MD  isosorbide mononitrate (IMDUR) 60 MG 24 hr tablet Take 60 mg by mouth daily.   Yes Historical Provider, MD  lidocaine (XYLOCAINE) 5 % ointment Apply 1 application topically 4 (four) times daily as needed for moderate pain.   Yes Historical Provider, MD  LIRAGLUTIDE [place] Inject 1.8 mg into the skin daily.   Yes Historical Provider, MD  lisinopril (PRINIVIL,ZESTRIL) 2.5 MG tablet Take 2.5 mg by mouth daily.   Yes Historical Provider, MD  loperamide (IMODIUM A-D) 2 MG tablet Take 2 mg by mouth 4 (four) times daily as needed for diarrhea or loose stools.   Yes Historical Provider, MD  magnesium oxide (MAG-OX) 400 MG tablet Take 400 mg by mouth 2 (two) times daily.   Yes Historical Provider, MD  metFORMIN (GLUCOPHAGE-XR) 500 MG 24 hr tablet Take 1,000 mg by mouth daily with breakfast.   Yes Historical Provider, MD  metoprolol succinate (TOPROL-XL) 100 MG 24 hr tablet Take 100 mg by mouth daily. Take with or immediately following a meal.   Yes Historical Provider, MD  Multiple Vitamin (MULTIVITAMIN WITH MINERALS) TABS tablet Take 1 tablet by mouth daily.   Yes Historical Provider, MD  nitroGLYCERIN (NITROSTAT) 0.4 MG SL tablet Place 0.4 mg under the tongue every 5 (five) minutes as needed for chest pain.   Yes Historical Provider, MD  Nitroglycerin (RECTIV) 0.4 % OINT Place 1 application rectally 2 (two) times daily as needed (for chest pain.).    Yes Historical Provider, MD  pantoprazole (PROTONIX) 40 MG tablet Take 40 mg by mouth daily.   Yes Historical Provider, MD  pramipexole (MIRAPEX) 0.125 MG tablet Take 0.125 mg by mouth at bedtime. 2-3 hours before bed.   Yes Historical Provider, MD  pravastatin (PRAVACHOL) 20 MG tablet Take 20 mg by mouth at bedtime.   Yes Historical Provider, MD  promethazine (PHENERGAN) 25 MG  tablet Take 25 mg by mouth every 6 (six) hours as needed for nausea or vomiting.   Yes Historical Provider, MD  ranitidine (ZANTAC) 300 MG tablet Take 300 mg by mouth at bedtime.   Yes Historical Provider, MD  temazepam (RESTORIL) 7.5 MG capsule Take 7.5 mg by mouth at bedtime as needed for sleep.   Yes Historical Provider, MD  triamcinolone cream (KENALOG) 0.5 % Apply 1 application topically 2 (two) times daily as needed (for skin irritation).    Yes Historical Provider, MD   BP 156/79 mmHg  Pulse 81  Temp(Src) 98.6 F (37 C) (Oral)  Resp 16  SpO2 98% Physical Exam  Constitutional: She is oriented to person, place, and time.  Uncomfortable, chronically ill   HENT:  Head: Normocephalic.  Mouth/Throat: Oropharynx is clear and moist.  Eyes: Conjunctivae are normal. Pupils are equal, round, and reactive to light.  Neck: Normal range of motion. Neck supple.  Cardiovascular: Normal rate, regular rhythm and normal heart sounds.   Pulmonary/Chest: Effort normal and breath sounds normal. No respiratory distress. She has no wheezes. She has no rales.  Abdominal:  Obese, urostomy stoma with no signs of cellulitis. + RLQ tenderness. + R CVAT.   Musculoskeletal: Normal range of motion. She exhibits no edema or tenderness.  Neurological: She is alert and oriented to person, place, and time. No cranial nerve deficit. Coordination normal.  Skin: Skin is warm.  Psychiatric: She has a normal mood and affect. Her behavior is normal. Judgment and thought content normal.  Nursing note and vitals reviewed.   ED Course  Procedures (including critical care time) Labs Review Labs Reviewed  URINALYSIS, ROUTINE W REFLEX MICROSCOPIC (NOT AT [facility]) - Abnormal; Notable for the following:    APPearance TURBID (*)    Hgb urine dipstick MODERATE (*)    Protein, ur 30 (*)    Leukocytes, UA TRACE (*)    All other components within normal limits  COMPREHENSIVE METABOLIC PANEL - Abnormal; Notable for the  following:    Glucose, Bld 156 (*)    Creatinine, Ser 1.18 (*)    GFR calc non Af Amer 49 (*)    GFR calc Af Amer 57 (*)    All other components within normal limits  CBC - Abnormal; Notable for the following:    Platelets 406 (*)    All other components within normal limits  URINE MICROSCOPIC-ADD ON - Abnormal; Notable for the following:    Squamous Epithelial / LPF FEW (*)    Bacteria, UA MANY (*)    All other components within normal limits  URINE CULTURE  LIPASE, BLOOD    Imaging Review Ct Abdomen Pelvis Wo Contrast  12/28/2014   CLINICAL DATA:  RIGHT flank pain radiating to RIGHT abdomen with nausea for 2 days, interstitial cystitis post cystectomy with urinary diversion, diabetes mellitus, irritable bowel syndrome, hypertension, hepatitis-C, asthma, former smoker  EXAM: CT ABDOMEN AND PELVIS WITHOUT CONTRAST  TECHNIQUE: Multidetector CT imaging of the abdomen and pelvis was performed following the standard protocol without IV contrast. Sagittal and coronal MPR images reconstructed from axial data set. Oral contrast not administered.  COMPARISON:  None  FINDINGS: Subsegmental atelectasis dependently RIGHT lower lobe.  Within limits of a nonenhanced exam, liver, spleen, pancreas, kidneys, and adrenal glands normal appearance.  No urinary tract calcification, hydronephrosis or ureteral dilatation.  Gallbladder surgically absent.  Prior cystectomy with urinary diversion in pelvis, ostomy RIGHT lower quadrant.  Calcifications are seen at the wall of the urostomy.  Stomach and small bowel loops grossly unremarkable.  Diverticulosis of descending and sigmoid colon with sigmoid wall thickening and pericolic inflammatory changes consistent with distal sigmoid diverticulitis.  No gross evidence of perforation or abscess.  Unable to exclude underlying colonic abnormalities in the setting of acute diverticulitis with colonic wall thickening.  No mass, adenopathy, free air or ascites.  LEFT inguinal  hernia containing fat.  Bones demineralized.  Scattered atherosclerotic calcifications.  Probable discogenic sclerosis at the superior anterior aspects of the T10 and T11 vertebral bodies.  IMPRESSION: Acute sigmoid diverticulitis without evidence of perforation or abscess.  Followup colonoscopy recommended following resolution of the patient's acute inflammatory process to exclude underlying abnormalities/tumor at the sigmoid colon.  Post cystectomy with urinary diversion/ostomy in RIGHT lower quadrant.  LEFT inguinal hernia containing fat.  Electronically Signed   By: Ulyses Southward M.D.   On: 12/28/2014 20:45   I have personally reviewed and evaluated these images and lab results as part of my medical decision-making.   EKG Interpretation None      MDM   Final diagnoses:  None   Clotile Willy Vorce is a 59 y.o. female here with R flank pain, RLQ pain. Consider pyelo vs stone vs appy. Will get labs, UA, Ct ab/pel.  10:10 PM UA + UTI. WBC nl. Vitals stable. Cr 1.2, baseline. CT showed diverticulitis with no perforation. Patient allergic to cipro. Will give zosyn, flagyl in the ED and dc home with keflex, flagyl. Pain controlled in the ED. Has multiple allergies so will dc home with percocet.     Richardean Canal, MD 12/28/14 [phone]

## 2014-12-28 NOTE — ED Notes (Signed)
Pt alert, oriented and ambulatory upon. DC she was advised o take the full course of abx and follow up with PCP.

## 2014-12-28 NOTE — ED Notes (Signed)
MD Yao at bedside 

## 2014-12-28 NOTE — ED Notes (Signed)
Pt requesting percocet. She reports her only reaction is itching and is requesting to have benadryl with percocet. She denies shortness of breath, tongue or throat swelling with previous administered percocet. MD Silverio Lay made aware and verbal order placed.

## 2014-12-28 NOTE — ED Notes (Signed)
Pt c/o R flank pain radiating into R abdomen and nausea x 2 days.  Pain score 10/10.  Pt has not taken anything for symptoms.  Denies diarrhea.  Hx of urostomy.

## 2015-01-01 LAB — URINE CULTURE

## 2015-01-02 ENCOUNTER — Telehealth (HOSPITAL_COMMUNITY): Payer: Self-pay

## 2015-01-02 NOTE — Progress Notes (Signed)
ED Antimicrobial Stewardship Positive Culture Follow Up   Laurie [name] is an 59 y.o. female who presented to [facility] on 12/28/2014 with a chief complaint of  Chief Complaint  Patient presents with  . Flank Pain  . Abdominal Pain  . Nausea    Recent Results (from the past 720 hour(s))  Urine culture     Status: None   Collection Time: 12/28/14  8:13 PM  Result Value Ref Range Status   Specimen Description URINE, CATHETERIZED  Final   Special Requests NONE  Final   Culture   Final    >=100,000 COLONIES/mL SERRATIA MARCESCENS Performed at [facility]    Report Status 01/01/2015 FINAL  Final   Organism ID, Bacteria SERRATIA MARCESCENS  Final      Susceptibility   Serratia marcescens - MIC*    CEFAZOLIN >=64 RESISTANT Resistant     CEFTRIAXONE <=1 SENSITIVE Sensitive     CIPROFLOXACIN 0.5 SENSITIVE Sensitive     GENTAMICIN <=1 SENSITIVE Sensitive     NITROFURANTOIN 256 RESISTANT Resistant     TRIMETH/SULFA <=20 SENSITIVE Sensitive     * >=100,000 COLONIES/mL SERRATIA MARCESCENS     Treated with cephalexin, metronidazole, organism resistant to prescribed antimicrobial  Patient discharged originally without antimicrobial agent and treatment is now indicated  New antibiotic prescription: Bactrim DS BID x 1 week with recommendations for rash treatment  ED Provider: Vassie Moselle 01/02/2015, 9:13 AM Infectious Diseases Pharmacist Phone# [phone]

## 2015-01-02 NOTE — Telephone Encounter (Incomplete)
Post ED Visit - Positive Culture Follow-up: Successful Patient Follow-Up  Culture assessed and recommendations reviewed by:  Celedonio Miyamoto, Pharm.D., BCPS-AQ ID  Georgina Pillion, Pharm.D., BCPS  [place], [place].D., BCPS, AAHIVP  Estella Husk, Pharm.D., BCPS, AAHIVP  [place], [address].D.  Cassie Roseanne [place], [address].DJules Husbands Pharm D  Positive urine culture   Patient discharged without antimicrobial prescription and treatment is now indicated  Organism is resistant to prescribed ED discharge antimicrobial  Patient with positive blood cultures  Changes discussed with ED provider: Haynes Dage New antibiotic prescription stop ceph/metronidazole and start  Bactrim DS bid x 1 week. Use benadryl 25 mg every 6 hours or HC Cream 1% as needed.  Attempting to contact pt   Ashley Jacobs 01/02/2015, 10:09 AM

## 2015-06-24 ENCOUNTER — Emergency Department (HOSPITAL_COMMUNITY): Payer: Medicare Other

## 2015-06-24 ENCOUNTER — Inpatient Hospital Stay (HOSPITAL_COMMUNITY)
Admission: EM | Admit: 2015-06-24 | Discharge: 2015-07-01 | DRG: 602 | Disposition: A | Discharge status: Home / Self Care | Payer: Medicare Other | Attending: Internal Medicine | Admitting: Internal Medicine

## 2015-06-24 ENCOUNTER — Encounter (HOSPITAL_COMMUNITY): Payer: Self-pay | Admitting: *Deleted

## 2015-06-24 DIAGNOSIS — J45909 Unspecified asthma, uncomplicated: Secondary | ICD-10-CM | POA: Diagnosis present

## 2015-06-24 DIAGNOSIS — E1169 Type 2 diabetes mellitus with other specified complication: Secondary | ICD-10-CM | POA: Diagnosis present

## 2015-06-24 DIAGNOSIS — N170 Acute kidney failure with tubular necrosis: Secondary | ICD-10-CM | POA: Diagnosis not present

## 2015-06-24 DIAGNOSIS — I2510 Atherosclerotic heart disease of native coronary artery without angina pectoris: Secondary | ICD-10-CM | POA: Diagnosis present

## 2015-06-24 DIAGNOSIS — K610 Anal abscess: Secondary | ICD-10-CM | POA: Diagnosis present

## 2015-06-24 DIAGNOSIS — Z87891 Personal history of nicotine dependence: Secondary | ICD-10-CM | POA: Diagnosis not present

## 2015-06-24 DIAGNOSIS — IMO0001 Reserved for inherently not codable concepts without codable children: Secondary | ICD-10-CM

## 2015-06-24 DIAGNOSIS — K319 Disease of stomach and duodenum, unspecified: Secondary | ICD-10-CM | POA: Diagnosis present

## 2015-06-24 DIAGNOSIS — E119 Type 2 diabetes mellitus without complications: Secondary | ICD-10-CM | POA: Diagnosis present

## 2015-06-24 DIAGNOSIS — N182 Chronic kidney disease, stage 2 (mild): Secondary | ICD-10-CM | POA: Diagnosis present

## 2015-06-24 DIAGNOSIS — E1122 Type 2 diabetes mellitus with diabetic chronic kidney disease: Secondary | ICD-10-CM | POA: Diagnosis present

## 2015-06-24 DIAGNOSIS — I10 Essential (primary) hypertension: Secondary | ICD-10-CM | POA: Diagnosis not present

## 2015-06-24 DIAGNOSIS — N189 Chronic kidney disease, unspecified: Secondary | ICD-10-CM | POA: Diagnosis present

## 2015-06-24 DIAGNOSIS — I129 Hypertensive chronic kidney disease with stage 1 through stage 4 chronic kidney disease, or unspecified chronic kidney disease: Secondary | ICD-10-CM | POA: Diagnosis present

## 2015-06-24 DIAGNOSIS — E1121 Type 2 diabetes mellitus with diabetic nephropathy: Secondary | ICD-10-CM | POA: Diagnosis present

## 2015-06-24 DIAGNOSIS — B3781 Candidal esophagitis: Secondary | ICD-10-CM | POA: Diagnosis present

## 2015-06-24 DIAGNOSIS — N179 Acute kidney failure, unspecified: Secondary | ICD-10-CM

## 2015-06-24 DIAGNOSIS — Z936 Other artificial openings of urinary tract status: Secondary | ICD-10-CM | POA: Diagnosis not present

## 2015-06-24 DIAGNOSIS — B9620 Unspecified Escherichia coli [E. coli] as the cause of diseases classified elsewhere: Secondary | ICD-10-CM | POA: Diagnosis present

## 2015-06-24 DIAGNOSIS — B1920 Unspecified viral hepatitis C without hepatic coma: Secondary | ICD-10-CM | POA: Diagnosis present

## 2015-06-24 DIAGNOSIS — J4520 Mild intermittent asthma, uncomplicated: Secondary | ICD-10-CM

## 2015-06-24 DIAGNOSIS — E669 Obesity, unspecified: Secondary | ICD-10-CM | POA: Diagnosis present

## 2015-06-24 DIAGNOSIS — L0231 Cutaneous abscess of buttock: Secondary | ICD-10-CM | POA: Diagnosis present

## 2015-06-24 DIAGNOSIS — R05 Cough: Secondary | ICD-10-CM

## 2015-06-24 DIAGNOSIS — Z6835 Body mass index (BMI) 35.0-35.9, adult: Secondary | ICD-10-CM

## 2015-06-24 DIAGNOSIS — L03317 Cellulitis of buttock: Secondary | ICD-10-CM | POA: Diagnosis present

## 2015-06-24 DIAGNOSIS — R52 Pain, unspecified: Secondary | ICD-10-CM

## 2015-06-24 DIAGNOSIS — M791 Myalgia: Secondary | ICD-10-CM | POA: Diagnosis present

## 2015-06-24 DIAGNOSIS — Z794 Long term (current) use of insulin: Secondary | ICD-10-CM

## 2015-06-24 DIAGNOSIS — R059 Cough, unspecified: Secondary | ICD-10-CM

## 2015-06-24 DIAGNOSIS — N390 Urinary tract infection, site not specified: Secondary | ICD-10-CM | POA: Diagnosis present

## 2015-06-24 DIAGNOSIS — N990 Postprocedural (acute) (chronic) kidney failure: Secondary | ICD-10-CM | POA: Diagnosis not present

## 2015-06-24 DIAGNOSIS — K589 Irritable bowel syndrome without diarrhea: Secondary | ICD-10-CM | POA: Diagnosis present

## 2015-06-24 LAB — CBC WITH DIFFERENTIAL/PLATELET
Basophils Absolute: 0 10*3/uL (ref 0.0–0.1)
Basophils Relative: 0 %
EOS ABS: 0.4 10*3/uL (ref 0.0–0.7)
EOS PCT: 3 %
HCT: 35.5 % — ABNORMAL LOW (ref 36.0–46.0)
Hemoglobin: 12.4 g/dL (ref 12.0–15.0)
LYMPHS ABS: 3.1 10*3/uL (ref 0.7–4.0)
Lymphocytes Relative: 18 %
MCH: 32 pg (ref 26.0–34.0)
MCHC: 34.9 g/dL (ref 30.0–36.0)
MCV: 91.5 fL (ref 78.0–100.0)
MONO ABS: 1.5 10*3/uL — AB (ref 0.1–1.0)
MONOS PCT: 9 %
Neutro Abs: 11.8 10*3/uL — ABNORMAL HIGH (ref 1.7–7.7)
Neutrophils Relative %: 70 %
PLATELETS: 412 10*3/uL — AB (ref 150–400)
RBC: 3.88 MIL/uL (ref 3.87–5.11)
RDW: 13.5 % (ref 11.5–15.5)
WBC: 16.8 10*3/uL — ABNORMAL HIGH (ref 4.0–10.5)

## 2015-06-24 LAB — COMPREHENSIVE METABOLIC PANEL
ALK PHOS: 75 U/L (ref 38–126)
ALT: 18 U/L (ref 14–54)
ANION GAP: 11 (ref 5–15)
AST: 19 U/L (ref 15–41)
Albumin: 4 g/dL (ref 3.5–5.0)
BUN: 17 mg/dL (ref 6–20)
CALCIUM: 9.6 mg/dL (ref 8.9–10.3)
CHLORIDE: 103 mmol/L (ref 101–111)
CO2: 27 mmol/L (ref 22–32)
Creatinine, Ser: 1.14 mg/dL — ABNORMAL HIGH (ref 0.44–1.00)
GFR calc non Af Amer: 51 mL/min — ABNORMAL LOW (ref >60)
GFR, EST AFRICAN AMERICAN: 59 mL/min — AB (ref >60)
Glucose, Bld: 92 mg/dL (ref 65–99)
Potassium: 3.7 mmol/L (ref 3.5–5.1)
SODIUM: 141 mmol/L (ref 135–145)
Total Bilirubin: 0.7 mg/dL (ref 0.3–1.2)
Total Protein: 8.1 g/dL (ref 6.5–8.1)

## 2015-06-24 LAB — I-STAT CG4 LACTIC ACID, ED: Lactic Acid, Venous: 0.84 mmol/L (ref 0.5–2.0)

## 2015-06-24 IMAGING — CT CT PELVIS W/ CM
2 of 3 series · 15 of 46 positions shown, 17 images · IV contrast (omnipaque)
Comparison: None.

CLINICAL DATA: Weeping portal at the left lower buttocks, near the
rectum. Pain and swelling. Initial encounter.

EXAM:
CT PELVIS WITH CONTRAST
TECHNIQUE: Multidetector CT imaging of the pelvis was performed using the
standard protocol following the bolus administration of intravenous
contrast.
CONTRAST:  100mL OMNIPAQUE IOHEXOL 300 MG/ML  SOLN

[Series 2: coronal images · coronal · 0.56mm/px · 3 of 107 slices shown]
[im 36/107  soft-tissue]
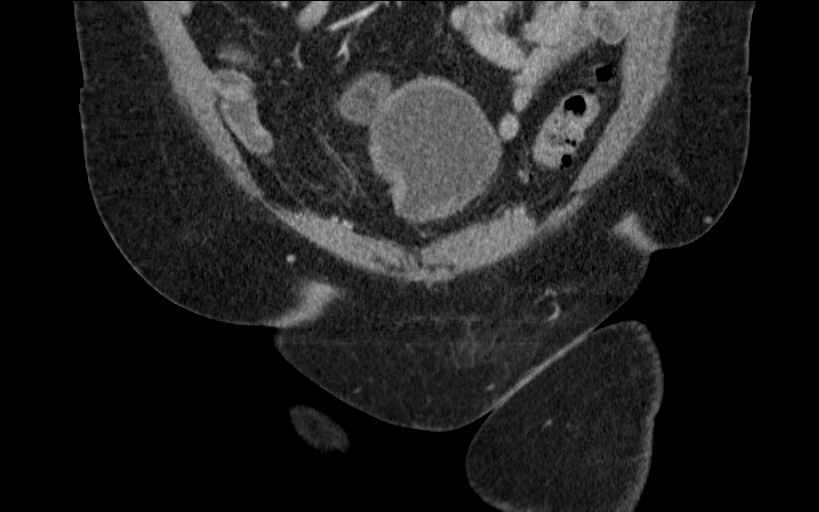
[im 48/107  soft-tissue]
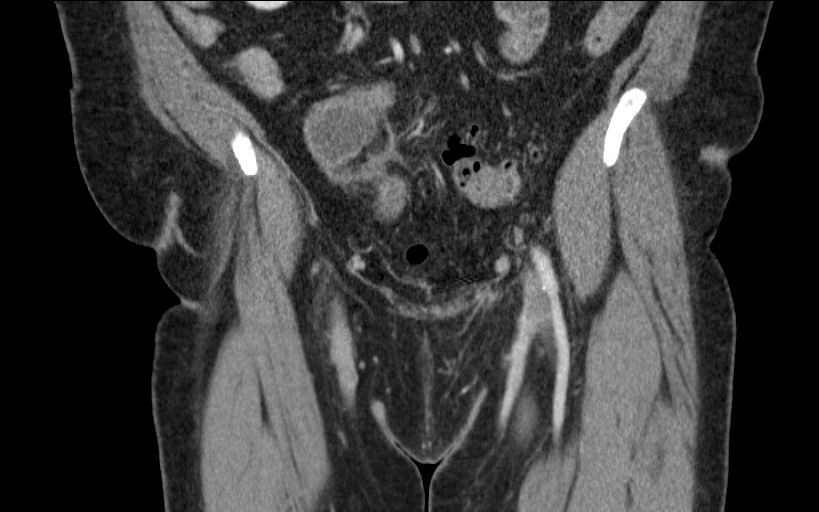
[im 59/107  soft-tissue]
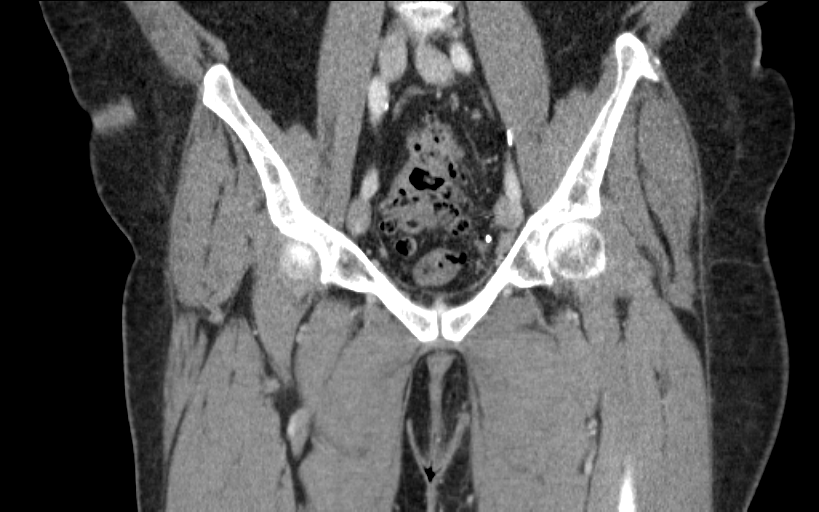

[Series 5: pelvis with · axial · 0.74mm/px · z∈[+784,+1034]mm · 12 of 58 slices shown, 14 images]
[im 4/58  soft-tissue]
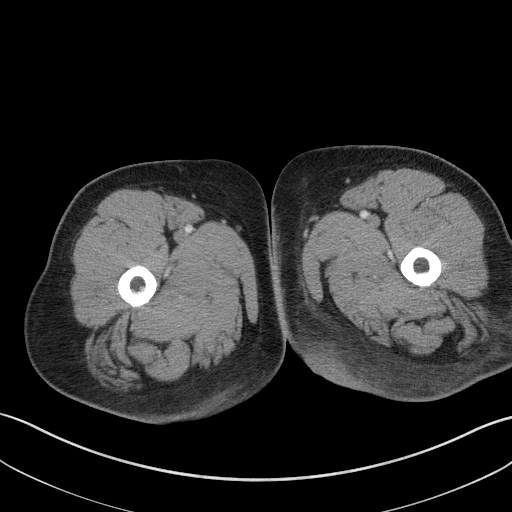
[im 4/58  bone]
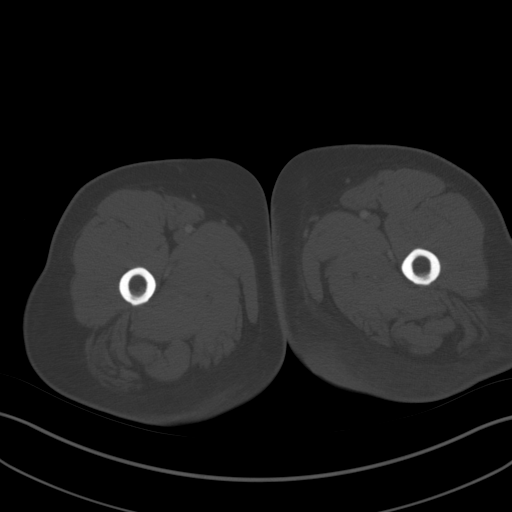
[im 8/58  soft-tissue]
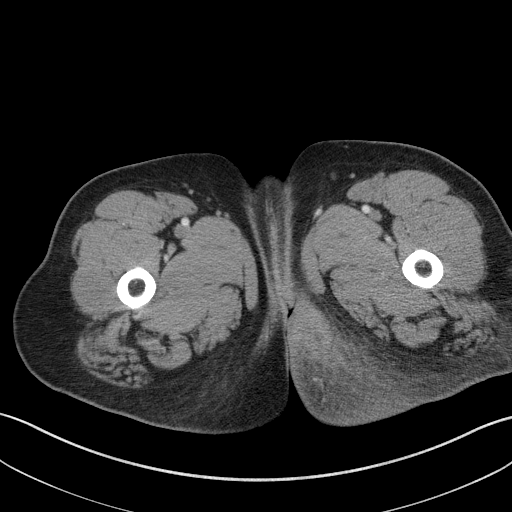
[im 13/58  soft-tissue]
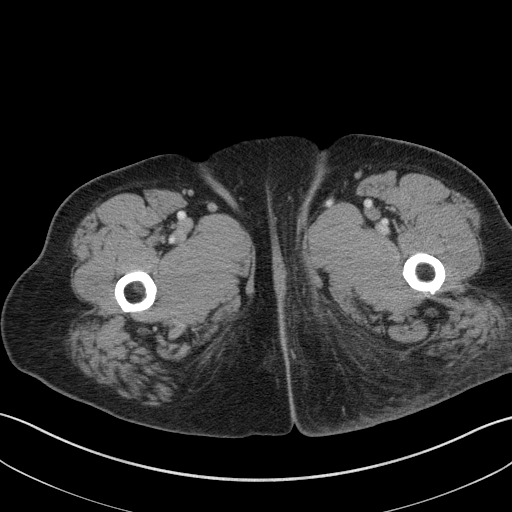
[im 17/58  soft-tissue]
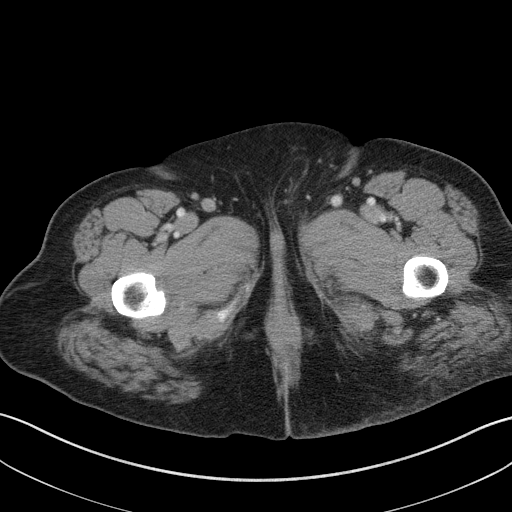
[im 23/58  soft-tissue]
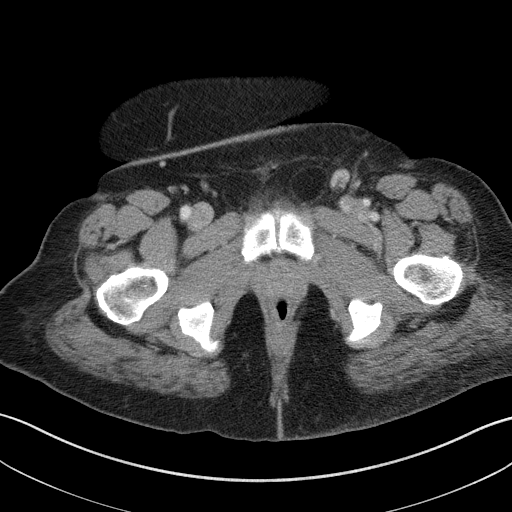
[im 26/58  soft-tissue]
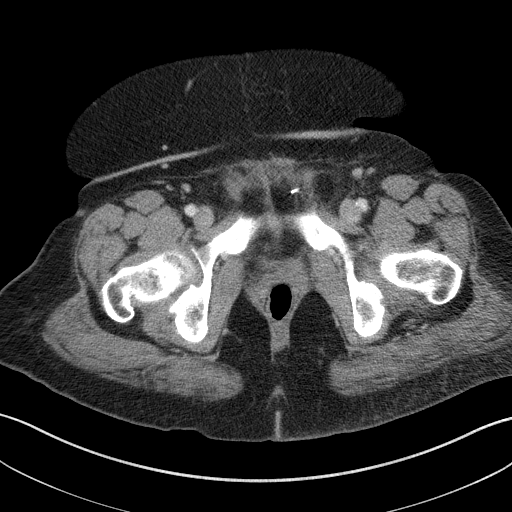
[im 32/58  soft-tissue]
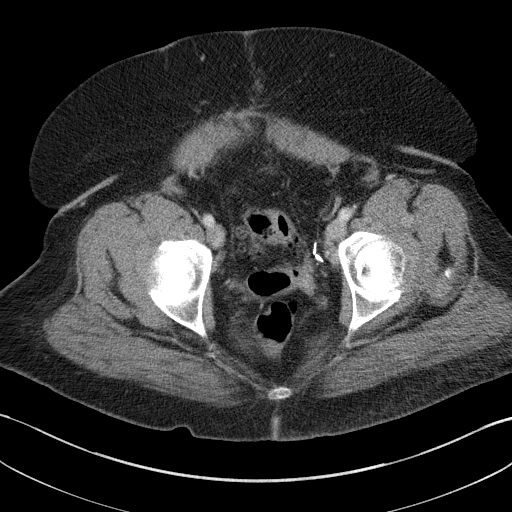
[im 35/58  soft-tissue]
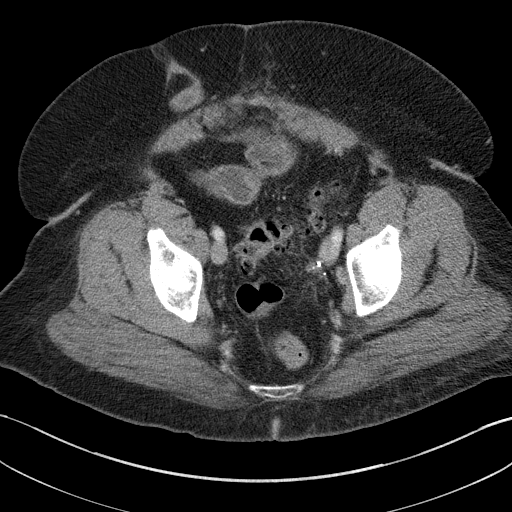
[im 41/58  soft-tissue]
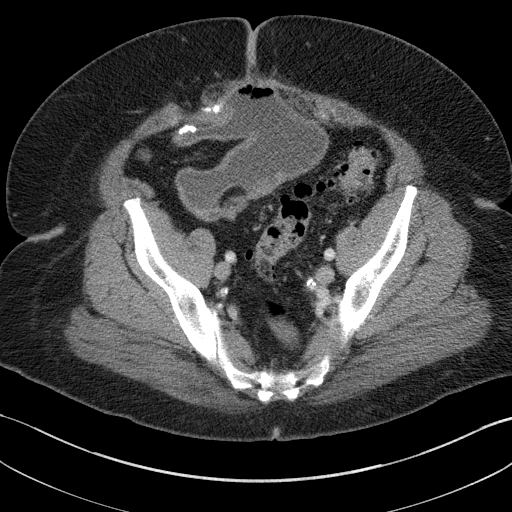
[im 41/58  bone]
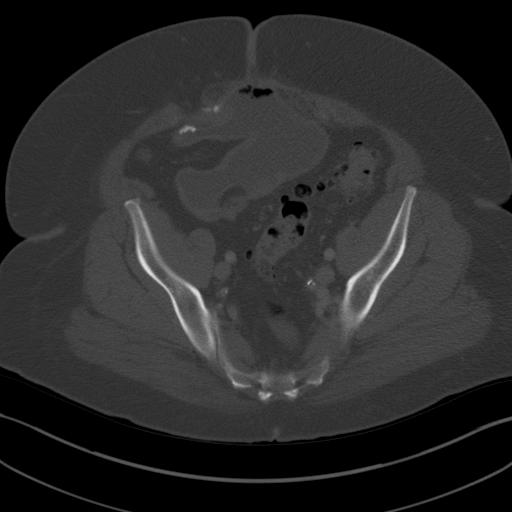
[im 45/58  soft-tissue]
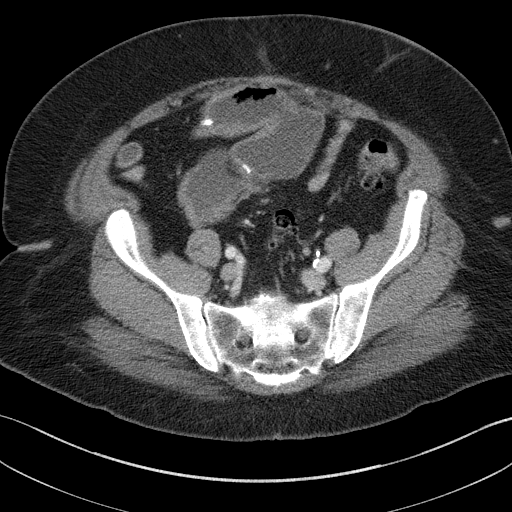
[im 50/58  soft-tissue]
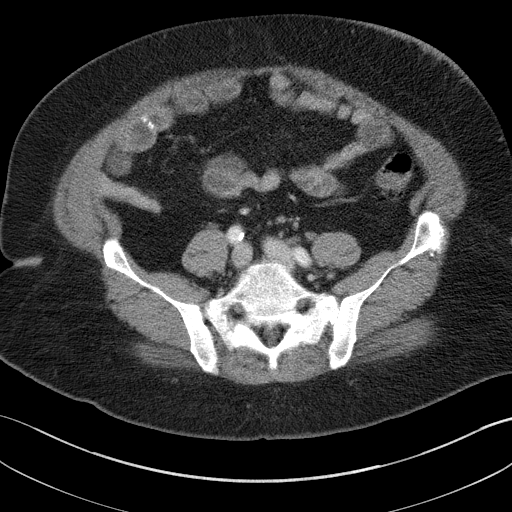
[im 54/58  soft-tissue]
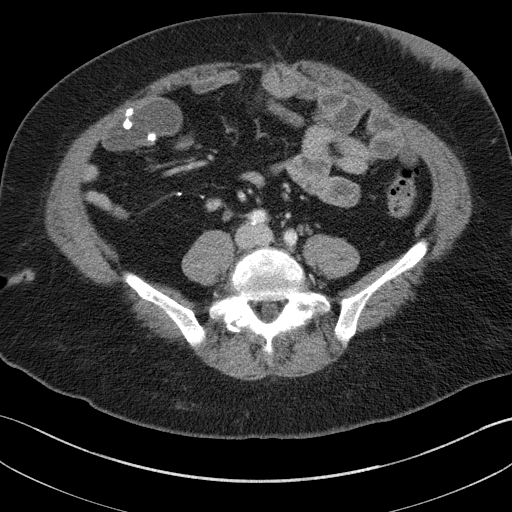

[15 of 46 positions shown; findings below may reference images not displayed]

FINDINGS: Focal soft tissue inflammation is noted along the left buttock,
tracking along the proximal left thigh. There is suggestion of an
associated small area of fluid measuring 2.3 x 1.4 cm, though no
well defined abscess is seen. This may simply reflect more focal
soft tissue edema.

The patient's urostomy is grossly unremarkable in appearance.
Visualized small and large bowel loops are grossly unremarkable,
aside from diverticulosis along the sigmoid colon. No acute osseous
abnormalities are identified. Scattered vascular calcifications are
seen.
IMPRESSION: 1. Focal soft tissue inflammation along the left buttock, tracking
along the proximal left thigh. Suggestion of associated small area
of fluid measuring 2.3 x 1.4 cm, though no well defined abscess is
seen. This may simply reflect more focal soft tissue edema.
2. Diverticulosis along the sigmoid colon.
3. Scattered vascular calcifications seen.

## 2015-06-24 MED ORDER — PRAVASTATIN SODIUM 20 MG PO TABS
20.0000 mg | ORAL_TABLET | Freq: Every day | ORAL | Status: DC
  Administered 2015-06-25 – 2015-06-30 (×7): 20 mg via ORAL
  Filled 2015-06-24 (×8): qty 1

## 2015-06-24 MED ORDER — ASPIRIN EC 81 MG PO TBEC
81.0000 mg | DELAYED_RELEASE_TABLET | Freq: Every day | ORAL | Status: DC
  Administered 2015-06-25 – 2015-07-01 (×7): 81 mg via ORAL
  Filled 2015-06-24 (×7): qty 1

## 2015-06-24 MED ORDER — FLUOXETINE HCL 20 MG PO CAPS
40.0000 mg | ORAL_CAPSULE | Freq: Every day | ORAL | Status: DC
  Administered 2015-06-25 – 2015-07-01 (×7): 40 mg via ORAL
  Filled 2015-06-24 (×7): qty 2

## 2015-06-24 MED ORDER — PIPERACILLIN-TAZOBACTAM 3.375 G IVPB
3.3750 g | Freq: Three times a day (TID) | INTRAVENOUS | Status: DC
  Administered 2015-06-25 – 2015-06-27 (×10): 3.375 g via INTRAVENOUS
  Filled 2015-06-24 (×13): qty 50

## 2015-06-24 MED ORDER — ONDANSETRON HCL 4 MG/2ML IJ SOLN
4.0000 mg | Freq: Four times a day (QID) | INTRAMUSCULAR | Status: DC | PRN
  Administered 2015-06-30: 4 mg via INTRAVENOUS
  Filled 2015-06-24: qty 2

## 2015-06-24 MED ORDER — DIPHENHYDRAMINE HCL 50 MG/ML IJ SOLN
12.5000 mg | Freq: Once | INTRAMUSCULAR | Status: AC
  Administered 2015-06-24: 12.5 mg via INTRAVENOUS
  Filled 2015-06-24: qty 1

## 2015-06-24 MED ORDER — MOMETASONE FURO-FORMOTEROL FUM 100-5 MCG/ACT IN AERO
2.0000 | INHALATION_SPRAY | Freq: Two times a day (BID) | RESPIRATORY_TRACT | Status: DC
  Administered 2015-06-25 – 2015-07-01 (×11): 2 via RESPIRATORY_TRACT
  Filled 2015-06-24 (×3): qty 8.8

## 2015-06-24 MED ORDER — FENTANYL CITRATE (PF) 100 MCG/2ML IJ SOLN
50.0000 ug | Freq: Once | INTRAMUSCULAR | Status: AC
  Administered 2015-06-24: 50 ug via INTRAVENOUS
  Filled 2015-06-24: qty 2

## 2015-06-24 MED ORDER — LISINOPRIL 2.5 MG PO TABS
2.5000 mg | ORAL_TABLET | Freq: Every day | ORAL | Status: DC
  Administered 2015-06-25: 2.5 mg via ORAL
  Filled 2015-06-24 (×2): qty 1

## 2015-06-24 MED ORDER — CEFAZOLIN SODIUM 1-5 GM-% IV SOLN
1.0000 g | Freq: Once | INTRAVENOUS | Status: AC
  Administered 2015-06-24: 1 g via INTRAVENOUS
  Filled 2015-06-24: qty 50

## 2015-06-24 MED ORDER — IOHEXOL 300 MG/ML  SOLN
100.0000 mL | Freq: Once | INTRAMUSCULAR | Status: AC | PRN
  Administered 2015-06-24: 100 mL via INTRAVENOUS

## 2015-06-24 MED ORDER — INSULIN ASPART 100 UNIT/ML ~~LOC~~ SOLN
0.0000 [IU] | Freq: Three times a day (TID) | SUBCUTANEOUS | Status: DC
  Administered 2015-06-25: 3 [IU] via SUBCUTANEOUS

## 2015-06-24 MED ORDER — ONDANSETRON HCL 4 MG PO TABS: 4.0000 mg | ORAL_TABLET | Freq: Four times a day (QID) | ORAL | Status: DC | PRN

## 2015-06-24 MED ORDER — ALBUTEROL SULFATE HFA 108 (90 BASE) MCG/ACT IN AERS: 2.0000 | INHALATION_SPRAY | RESPIRATORY_TRACT | Status: DC | PRN

## 2015-06-24 MED ORDER — INSULIN GLARGINE 100 UNIT/ML ~~LOC~~ SOLN
10.0000 [IU] | Freq: Every day | SUBCUTANEOUS | Status: DC
  Administered 2015-06-25: 10 [IU] via SUBCUTANEOUS
  Filled 2015-06-24 (×2): qty 0.1

## 2015-06-24 MED ORDER — ISOSORBIDE MONONITRATE ER 60 MG PO TB24
60.0000 mg | ORAL_TABLET | Freq: Every day | ORAL | Status: DC
  Administered 2015-06-25 – 2015-07-01 (×7): 60 mg via ORAL
  Filled 2015-06-24 (×7): qty 1

## 2015-06-24 MED ORDER — METOPROLOL SUCCINATE ER 100 MG PO TB24
100.0000 mg | ORAL_TABLET | Freq: Every day | ORAL | Status: DC
  Administered 2015-06-25 – 2015-07-01 (×7): 100 mg via ORAL
  Filled 2015-06-24 (×7): qty 1

## 2015-06-24 MED ORDER — ENOXAPARIN SODIUM 30 MG/0.3ML ~~LOC~~ SOLN
30.0000 mg | Freq: Every day | SUBCUTANEOUS | Status: DC
  Administered 2015-06-25: 30 mg via SUBCUTANEOUS
  Filled 2015-06-24 (×2): qty 0.3

## 2015-06-24 MED ORDER — ACETAMINOPHEN 325 MG PO TABS: 650.0000 mg | ORAL_TABLET | Freq: Four times a day (QID) | ORAL | Status: DC | PRN

## 2015-06-24 MED ORDER — AMLODIPINE BESYLATE 10 MG PO TABS
10.0000 mg | ORAL_TABLET | Freq: Every day | ORAL | Status: DC
  Administered 2015-06-25 – 2015-07-01 (×7): 10 mg via ORAL
  Filled 2015-06-24 (×7): qty 1

## 2015-06-24 MED ORDER — INSULIN ASPART 100 UNIT/ML ~~LOC~~ SOLN
0.0000 [IU] | Freq: Every day | SUBCUTANEOUS | Status: DC
  Administered 2015-06-25: 4 [IU] via SUBCUTANEOUS
  Administered 2015-06-27: 3 [IU] via SUBCUTANEOUS
  Administered 2015-06-29: 2 [IU] via SUBCUTANEOUS

## 2015-06-24 MED ORDER — NYSTATIN 100000 UNIT/ML MT SUSP
5.0000 mL | Freq: Four times a day (QID) | OROMUCOSAL | Status: DC
  Administered 2015-06-25 – 2015-07-01 (×22): 500000 [IU] via ORAL
  Filled 2015-06-24 (×29): qty 5

## 2015-06-24 MED ORDER — GABAPENTIN 300 MG PO CAPS
600.0000 mg | ORAL_CAPSULE | Freq: Two times a day (BID) | ORAL | Status: DC
  Administered 2015-06-25 – 2015-06-28 (×9): 600 mg via ORAL
  Filled 2015-06-24 (×11): qty 2

## 2015-06-24 MED ORDER — PANTOPRAZOLE SODIUM 40 MG PO TBEC
40.0000 mg | DELAYED_RELEASE_TABLET | Freq: Every day | ORAL | Status: DC
  Administered 2015-06-25 – 2015-07-01 (×7): 40 mg via ORAL
  Filled 2015-06-24 (×8): qty 1

## 2015-06-24 MED ORDER — SODIUM CHLORIDE 0.9 % IV SOLN
Freq: Once | INTRAVENOUS | Status: AC
  Administered 2015-06-24: 22:00:00 via INTRAVENOUS

## 2015-06-24 MED ORDER — TRAZODONE HCL 50 MG PO TABS
50.0000 mg | ORAL_TABLET | Freq: Every day | ORAL | Status: DC
  Administered 2015-06-25 – 2015-06-30 (×7): 50 mg via ORAL
  Filled 2015-06-24 (×9): qty 1

## 2015-06-24 MED ORDER — ACETAMINOPHEN 650 MG RE SUPP: 650.0000 mg | Freq: Four times a day (QID) | RECTAL | Status: DC | PRN

## 2015-06-24 MED ORDER — TEMAZEPAM 7.5 MG PO CAPS: 7.5000 mg | ORAL_CAPSULE | Freq: Every evening | ORAL | Status: DC | PRN

## 2015-06-24 NOTE — ED Notes (Signed)
Weeping boil type area on left lower buttocks near rectum, swollen and painful

## 2015-06-24 NOTE — ED Provider Notes (Signed)
CSN: [id number]     Arrival date & time 06/24/15  1705 History   First MD Initiated Contact with Patient 06/24/15 1734     Chief Complaint  Patient presents with  . Abscess     (Consider location/radiation/quality/duration/timing/severity/associated sxs/prior Treatment) The history is provided by the patient and medical records. No language interpreter was used.     Laurie [name] is a 60 y.o. female  with a hx of Insulin-dependent diabetes, interstitial cystitis, coronary artery disease, chronic kidney disease, hypertension, hepatitis C, GERD, low back pain presents to the Emergency Department complaining of gradual, persistent, progressively worsening abscess to the left buttock area.  Patient reports that it began 3 days ago as a "sore spot."  She reports using warm compresses however it did not come to a head or began draining. Patient denies history of recurrent abscesses. She reports that she was seen by an urgent care earlier today who recommended she come here to the emergency department as she would potentially need to see surgery.  She reports associated nausea but no measured fevers at home. Denies vomiting, diarrhea, abdominal pain.  No alleviating factors.     Past Medical History  Diagnosis Date  . Diabetes mellitus without complication (HCC)   . IBS (irritable bowel syndrome)   . Interstitial cystitis   . Myofascial pain syndrome   . Carpal tunnel syndrome   . GI bleed   . Coronary artery disease   . B12 deficiency   . CKD (chronic kidney disease)   . Hypertension   . Iron deficiency anemia   . Glaucoma   . Hepatitis C   . Anal fissure   . Candidal esophagitis (HCC)   . Obesity (BMI 35.0-39.9 without comorbidity) (HCC)   . Diabetic gastropathy (HCC)   . Plantar fasciitis   . GERD (gastroesophageal reflux disease)   . Asthma   . Chronic UTI   . Chronic abdominal pain   . Episodic low back pain    Past Surgical History  Procedure Laterality Date  .  Revision urostomy cutaneous    . Abdominal hysterectomy    . Cholecystectomy    . Bilateral carpal tunnel release    . Breast surgery    . Esophagogastroduodenoscopy    . Colonoscopy     Family History  Problem Relation Age of Onset  . CAD Mother   . Cancer Father   . Stroke Brother    Social History  Substance Use Topics  . Smoking status: Former Games developer  . Smokeless tobacco: None  . Alcohol Use: No   OB History    No data available     Review of Systems  Constitutional: Negative for fever, diaphoresis, appetite change, fatigue and unexpected weight change.  HENT: Negative for mouth sores.   Eyes: Negative for visual disturbance.  Respiratory: Negative for cough, chest tightness, shortness of breath and wheezing.   Cardiovascular: Negative for chest pain.  Gastrointestinal: Negative for nausea, vomiting, abdominal pain, diarrhea and constipation.  Endocrine: Negative for polydipsia, polyphagia and polyuria.  Genitourinary: Negative for dysuria, urgency, frequency and hematuria.  Musculoskeletal: Negative for back pain and neck stiffness.  Skin: Positive for wound. Negative for rash.  Allergic/Immunologic: Negative for immunocompromised state.  Neurological: Negative for syncope, light-headedness and headaches.  Hematological: Does not bruise/bleed easily.  Psychiatric/Behavioral: Negative for sleep disturbance. The patient is not nervous/anxious.       Allergies  Celebrex; Ciprofloxacin; Cymbalta; Dilaudid; Diuretic; Fentanyl and related; Glucophage; Lyrica; Oxycodone; Morphine and  related; Sulfa antibiotics; and Vicodin  Home Medications   Prior to Admission medications   Medication Sig Start Date End Date Taking? Authorizing Provider  albuterol (PROVENTIL HFA;VENTOLIN HFA) 108 (90 BASE) MCG/ACT inhaler Inhale 2 puffs into the lungs every 4 (four) hours as needed for wheezing or shortness of breath.   Yes Historical Provider, MD  amLODipine (NORVASC) 5 MG tablet  Take 10 mg by mouth daily.    Yes Historical Provider, MD  aspirin EC 81 MG tablet Take 81 mg by mouth daily.   Yes Historical Provider, MD  budesonide-formoterol (SYMBICORT) 80-4.5 MCG/ACT inhaler Inhale 2 puffs into the lungs 2 (two) times daily.   Yes Historical Provider, MD  cholecalciferol (VITAMIN D) 1000 UNITS tablet Take 1,000 Units by mouth daily.   Yes Historical Provider, MD  colestipol (COLESTID) 1 G tablet Take 1 g by mouth daily.    Yes Historical Provider, MD  cyanocobalamin 1000 MCG tablet Take 1,000 mcg by mouth daily.    Yes Historical Provider, MD  FLUoxetine (PROZAC) 20 MG capsule Take 40 mg by mouth daily.   Yes Historical Provider, MD  gabapentin (NEURONTIN) 300 MG capsule Take 600 mg by mouth 2 (two) times daily.   Yes Historical Provider, MD  hydrocortisone (ANUSOL-HC) 2.5 % rectal cream Place 1 application rectally 3 (three) times daily as needed for hemorrhoids.    Yes Historical Provider, MD  insulin regular human CONCENTRATED (HUMULIN R) 500 UNIT/ML injection Inject 8-15 Units into the skin 3 (three) times daily. Take 10 units at breakfast, 15 units at lunch and 8 units at dinner.   Yes Historical Provider, MD  isosorbide mononitrate (IMDUR) 60 MG 24 hr tablet Take 60 mg by mouth daily.   Yes Historical Provider, MD  LIRAGLUTIDE [place] Inject 1.8 mg into the skin daily.   Yes Historical Provider, MD  lisinopril (PRINIVIL,ZESTRIL) 2.5 MG tablet Take 2.5 mg by mouth daily.   Yes Historical Provider, MD  loperamide (IMODIUM A-D) 2 MG tablet Take 2 mg by mouth 4 (four) times daily as needed for diarrhea or loose stools.   Yes Historical Provider, MD  magnesium oxide (MAG-OX) 400 MG tablet Take 400 mg by mouth 2 (two) times daily.   Yes Historical Provider, MD  metFORMIN (GLUCOPHAGE-XR) 500 MG 24 hr tablet Take 1,000 mg by mouth daily with breakfast.   Yes Historical Provider, MD  metoprolol succinate (TOPROL-XL) 100 MG 24 hr tablet Take 100 mg by mouth daily. Take with or  immediately following a meal.   Yes Historical Provider, MD  Multiple Vitamin (MULTIVITAMIN WITH MINERALS) TABS tablet Take 1 tablet by mouth daily.   Yes Historical Provider, MD  nystatin (MYCOSTATIN) 100000 UNIT/ML suspension Take 5 mLs by mouth 4 (four) times daily.   Yes Historical Provider, MD  pantoprazole (PROTONIX) 40 MG tablet Take 40 mg by mouth daily.   Yes Historical Provider, MD  pravastatin (PRAVACHOL) 20 MG tablet Take 20 mg by mouth at bedtime.   Yes Historical Provider, MD  promethazine (PHENERGAN) 25 MG tablet Take 25 mg by mouth every 6 (six) hours as needed for nausea or vomiting.   Yes Historical Provider, MD  ranitidine (ZANTAC) 300 MG tablet Take 300 mg by mouth at bedtime.   Yes Historical Provider, MD  temazepam (RESTORIL) 7.5 MG capsule Take 7.5 mg by mouth at bedtime as needed for sleep.   Yes Historical Provider, MD  traZODone (DESYREL) 50 MG tablet Take 50 mg by mouth at bedtime.   Yes  Historical Provider, MD  triamcinolone cream (KENALOG) 0.5 % Apply 1 application topically 2 (two) times daily as needed (for skin irritation).    Yes Historical Provider, MD  cephALEXin (KEFLEX) 500 MG capsule Take 2 capsules (1,000 mg total) by mouth 3 (three) times daily. Patient not taking: Reported on 06/24/2015 12/28/14   Richardean Canalavid H Yao, MD  fluticasone [facility](FLONASE) 50 MCG/ACT nasal spray Place 2 sprays into both nostrils daily as needed for allergies or rhinitis.    Historical Provider, MD  glucagon (GLUCAGON EMERGENCY) 1 MG injection Inject 1 mg into the vein once as needed (severe insulin reaction).    Historical Provider, MD  lidocaine (XYLOCAINE) 5 % ointment Apply 1 application topically 4 (four) times daily as needed for moderate pain.    Historical Provider, MD  metroNIDAZOLE (FLAGYL) 500 MG tablet Take 1 tablet (500 mg total) by mouth 2 (two) times daily. Patient not taking: Reported on 06/24/2015 12/28/14   Richardean Canalavid H Yao, MD  nitroGLYCERIN (NITROSTAT) 0.4 MG SL tablet Place 0.4 mg  under the tongue every 5 (five) minutes as needed for chest pain.    Historical Provider, MD  Nitroglycerin (RECTIV) 0.4 % OINT Place 1 application rectally 2 (two) times daily as needed (for chest pain.).     Historical Provider, MD  oxyCODONE-acetaminophen (PERCOCET) 5-325 MG per tablet Take 1-2 tablets by mouth every 6 (six) hours as needed. Patient not taking: Reported on 06/24/2015 12/28/14   Richardean Canalavid H Yao, MD   BP 106/92 mmHg  Pulse 76  Temp(Src) 98.4 F (36.9 C) (Oral)  Resp 18  SpO2 97% Physical Exam  Constitutional: She appears well-developed and well-nourished. No distress.  Awake, alert, nontoxic appearance  HENT:  Head: Normocephalic and atraumatic.  Mouth/Throat: Oropharynx is clear and moist. No oropharyngeal exudate.  Eyes: Conjunctivae are normal. No scleral icterus.  Neck: Normal range of motion. Neck supple.  Cardiovascular: Normal rate, regular rhythm and intact distal pulses.   Pulmonary/Chest: Effort normal and breath sounds normal. No respiratory distress. She has no wheezes.  Equal chest expansion  Abdominal: Soft. Bowel sounds are normal. She exhibits no mass. There is no tenderness. There is no rebound and no guarding.  Genitourinary:     Large area of erythema and swelling and induration to the left buttock with flexion area approximately 4cm in diameter.  Significant tenderness extends to the perineum and left labia majora with swelling, tenderness and erythema No significant tenderness perirectally.  Musculoskeletal: Normal range of motion. She exhibits no edema.  Neurological: She is alert.  Speech is clear and goal oriented Moves extremities without ataxia  Skin: Skin is warm and dry. She is not diaphoretic.  Psychiatric: She has a normal mood and affect.  Nursing note and vitals reviewed.   ED Course  Procedures (including critical care time) Labs Review Labs Reviewed  CBC WITH DIFFERENTIAL/PLATELET - Abnormal; Notable for the following:    WBC  16.8 (*)    HCT 35.5 (*)    Platelets 412 (*)    Neutro Abs 11.8 (*)    Monocytes Absolute 1.5 (*)    All other components within normal limits  COMPREHENSIVE METABOLIC PANEL - Abnormal; Notable for the following:    Creatinine, Ser 1.14 (*)    GFR calc non Af Amer 51 (*)    GFR calc Af Amer 59 (*)    All other components within normal limits  URINE CULTURE  URINALYSIS, ROUTINE W REFLEX MICROSCOPIC (NOT [facility])  I-STAT CG4 LACTIC ACID,  ED    Imaging Review Ct Pelvis W Contrast  06/24/2015  CLINICAL DATA:  Weeping portal at the left lower buttocks, near the rectum. Pain and swelling. Initial encounter. EXAM: CT PELVIS WITH CONTRAST TECHNIQUE: Multidetector CT imaging of the pelvis was performed using the standard protocol following the bolus administration of intravenous contrast. CONTRAST:  OMNIPAQUE IOHEXOL 300 MG/ML  SOLN COMPARISON:  None. FINDINGS: Focal soft tissue inflammation is noted along the left buttock, tracking along the proximal left thigh. There is suggestion of an associated small area of fluid measuring 2.3 x 1.4 cm, though no well defined abscess is seen. This may simply reflect more focal soft tissue edema. The patient's urostomy is grossly unremarkable in appearance. Visualized small and large bowel loops are grossly unremarkable, aside from diverticulosis along the sigmoid colon. No acute osseous abnormalities are identified. Scattered vascular calcifications are seen. IMPRESSION: 1. Focal soft tissue inflammation along the left buttock, tracking along the proximal left thigh. Suggestion of associated small area of fluid measuring 2.3 x 1.4 cm, though no well defined abscess is seen. This may simply reflect more focal soft tissue edema. 2. Diverticulosis along the sigmoid colon. 3. Scattered vascular calcifications seen. Electronically Signed   By: Roanna Raider M.D.   On: 06/24/2015 21:30   I have personally reviewed and evaluated these images and lab results as part  of my medical decision-making.   MDM   Final diagnoses:  Cellulitis of left buttock  IDDM (insulin dependent diabetes mellitus) (HCC)    Laurie [name] presents with large abscess to the left buttocks extending into the perineum and left labia majora. Patient is an insulin-dependent diabetic. Afebrile without tachycardia or hypotension here however concern for abscess that tracks deeper into her pelvis. Will obtain CT scan and labs.  10:33 PM Leukocytosis noted. Patient remains afebrile without hypotension or tachycardia. CT scan shows large area of cellulitis without defined abscess.  No evidence of perianal/perirectal abscess.  As patient is an insulin-dependent diabetic she will need IV antibiotics for aggressive cellulitis and admission.  The patient was discussed with and seen by Dr. Deretha Emory who agrees with the treatment plan.  Discussed with Dr. Felipa Furnace who will admit.     Dahlia Client Presleigh Feldstein, PA-C 06/24/15 [phone]

## 2015-06-24 NOTE — ED Provider Notes (Signed)
Medical screening examination/treatment/procedure(s) were conducted as a shared visit with non-physician practitioner(s) and myself.  I personally evaluated the patient during the encounter.   EKG Interpretation None       Results for orders placed or performed during the hospital encounter of 06/24/15  CBC with Differential  Result Value Ref Range   WBC 16.8 (H) 4.0 - 10.5 K/uL   RBC 3.88 3.87 - 5.11 MIL/uL   Hemoglobin 12.4 12.0 - 15.0 g/dL   HCT 16.135.5 (L) 09.636.0 - 04.546.0 %   MCV 91.5 78.0 - 100.0 fL   MCH 32.0 26.0 - 34.0 pg   MCHC 34.9 30.0 - 36.0 g/dL   RDW 40.913.5 81.111.5 - 91.415.5 %   Platelets 412 (H) 150 - 400 K/uL   Neutrophils Relative % 70 %   Neutro Abs 11.8 (H) 1.7 - 7.7 K/uL   Lymphocytes Relative 18 %   Lymphs Abs 3.1 0.7 - 4.0 K/uL   Monocytes Relative 9 %   Monocytes Absolute 1.5 (H) 0.1 - 1.0 K/uL   Eosinophils Relative 3 %   Eosinophils Absolute 0.4 0.0 - 0.7 K/uL   Basophils Relative 0 %   Basophils Absolute 0.0 0.0 - 0.1 K/uL  Comprehensive metabolic panel  Result Value Ref Range   Sodium 141 135 - 145 mmol/L   Potassium 3.7 3.5 - 5.1 mmol/L   Chloride 103 101 - 111 mmol/L   CO2 27 22 - 32 mmol/L   Glucose, Bld 92 65 - 99 mg/dL   BUN 17 6 - 20 mg/dL   Creatinine, Ser 7.821.14 (H) 0.44 - 1.00 mg/dL   Calcium 9.6 8.9 - 95.610.3 mg/dL   Total Protein 8.1 6.5 - 8.1 g/dL   Albumin 4.0 3.5 - 5.0 g/dL   AST 19 15 - 41 U/L   ALT 18 14 - 54 U/L   Alkaline Phosphatase 75 38 - 126 U/L   Total Bilirubin 0.7 0.3 - 1.2 mg/dL   GFR calc non Af Amer 51 (L) >60 mL/min   GFR calc Af Amer 59 (L) >60 mL/min   Anion gap 11 5 - 15  I-Stat CG4 Lactic Acid, ED  Result Value Ref Range   Lactic Acid, Venous 0.84 0.5 - 2.0 mmol/L   Ct Pelvis W Contrast  06/24/2015  CLINICAL DATA:  Weeping portal at the left lower buttocks, near the rectum. Pain and swelling. Initial encounter. EXAM: CT PELVIS WITH CONTRAST TECHNIQUE: Multidetector CT imaging of the pelvis was performed using the standard  protocol following the bolus administration of intravenous contrast. CONTRAST:  100mL OMNIPAQUE IOHEXOL 300 MG/ML  SOLN COMPARISON:  None. FINDINGS: Focal soft tissue inflammation is noted along the left buttock, tracking along the proximal left thigh. There is suggestion of an associated small area of fluid measuring 2.3 x 1.4 cm, though no well defined abscess is seen. This may simply reflect more focal soft tissue edema. The patient's urostomy is grossly unremarkable in appearance. Visualized small and large bowel loops are grossly unremarkable, aside from diverticulosis along the sigmoid colon. No acute osseous abnormalities are identified. Scattered vascular calcifications are seen. IMPRESSION: 1. Focal soft tissue inflammation along the left buttock, tracking along the proximal left thigh. Suggestion of associated small area of fluid measuring 2.3 x 1.4 cm, though no well defined abscess is seen. This may simply reflect more focal soft tissue edema. 2. Diverticulosis along the sigmoid colon. 3. Scattered vascular calcifications seen. Electronically Signed   By: Roanna RaiderJeffery  Chang M.D.   On:  06/24/2015 21:30    Patient with a boil to the left lower buttocks area. Near the rectum. CT scan shows no evidence of any deep space infection. On examination indurated area measuring about 5 cm. With some ulceration and some small of several pus stools to the surface. With a little bit of drainage. No evidence of any distinct fluctuance. CT scan raise question of a small area of fluid measuring about 2 x 1.5 cm but no well defined abscess at this time. Nothing to I&D. We'll treat her with antibiotics blood sugar control blood sugars currently well controlled. And may develop into an abscess if so then it can be I&D. No evidence of any perianal perirectal abscess at this time.  Vanetta Mulders, MD 06/24/15 2235

## 2015-06-24 NOTE — H&P (Signed)
PCP:  Vanita Ingles Referring provider El Castillo PA   Chief Complaint:   Buttock pain  HPI: Laurie [name] is a 60 y.o. female   has a past medical history of Diabetes mellitus without complication (HCC); IBS (irritable bowel syndrome); Interstitial cystitis; Myofascial pain syndrome; Carpal tunnel syndrome; GI bleed; Coronary artery disease; B12 deficiency; CKD (chronic kidney disease); Hypertension; Iron deficiency anemia; Glaucoma; Hepatitis C; Anal fissure; Candidal esophagitis (HCC); Obesity (BMI 35.0-39.9 without comorbidity) (HCC); Diabetic gastropathy (HCC); Plantar fasciitis; GERD (gastroesophageal reflux disease); Asthma; Chronic UTI; Chronic abdominal pain; and Episodic low back pain.   Presented with left buttock swelling to primary care provider at [facility] she started to have pain and swelling and drainage from a boil of worsening of her past 4 days and was seen  by GI doctor she was prescribed Flagyl 500 mg twice a day but there was no improvement. Otherwise she reports mild   nausea no vomiting. At the office a diagnosed of recurrent UTI in January she had Serratia marcescens UTI with multiple resistance but sensitive to ceftriaxone resistant to cefazolin also sensitive to Cipro and Zosyn Patient endorses also some frequent loose BM 3 times a week which is chronic,. Sh is sp urostomy due to hx of chronic intertitial cystitis, noted that urine in the bag was foul smelling more than usual.   IN ER:  White blood cell count 16.8 hemoglobin 12.4. Lactic acid 0.84 patient afebrile heart rate 86 CT scan showing focal soft tissue inflammation alone left buttock tracking along proximal left thigh is suggestion of associated small area of fluid measuring 2.31.4 cm though note is well-defined abscess noted  Regarding pertinent past history: Patient receives majority of her care to count 14th of March she have undergone screening colonoscopy was some polyp removal. Endoscopy on  the same date showed [place]  patient has history of diabetes on Humulin U-500 concentrated  Hospitalist was called for admission for left buttock cellulitis and possible perianal abcess  Review of Systems:    Pertinent positives include:  Some nausea dyspnea on exertion, change in color of urine, foul smell  Constitutional:  No weight loss, night sweats, fatigue, weight loss  HEENT:  No headaches, Difficulty swallowing,Tooth/dental problems,Sore throat,  No sneezing, itching, ear ache, nasal congestion, post nasal drip,  Cardio-vascular:  No chest pain, Orthopnea, PND, anasarca, dizziness, palpitations.no Bilateral lower extremity swelling  GI:  No heartburn, indigestion, abdominal pain, nausea, vomiting, diarrhea, change in bowel habits, loss of appetite, melena, blood in stool, hematemesis Resp:  no shortness of breath at rest. No No excess mucus, no productive cough, No non-productive cough, No coughing up of blood.No change in color of mucus.No wheezing. Skin:  no rash or lesions. No jaundice GU:  no dysuria,  no urgency or frequency. No straining to urinate.  No flank pain.  Musculoskeletal:  No joint pain or no joint swelling. No decreased range of motion. No back pain.  Psych:  No change in mood or affect. No depression or anxiety. No memory loss.  Neuro: no localizing neurological complaints, no tingling, no weakness, no double vision, no gait abnormality, no slurred speech, no confusion  Otherwise ROS are negative except for above, 10 systems were reviewed  Past Medical History: Past Medical History  Diagnosis Date  . Diabetes mellitus without complication (HCC)   . IBS (irritable bowel syndrome)   . Interstitial cystitis   . Myofascial pain syndrome   . Carpal tunnel syndrome   .  GI bleed   . Coronary artery disease   . B12 deficiency   . CKD (chronic kidney disease)   . Hypertension   . Iron deficiency anemia   . Glaucoma   . Hepatitis C   . Anal fissure   .  Candidal esophagitis (HCC)   . Obesity (BMI 35.0-39.9 without comorbidity) (HCC)   . Diabetic gastropathy (HCC)   . Plantar fasciitis   . GERD (gastroesophageal reflux disease)   . Asthma   . Chronic UTI   . Chronic abdominal pain   . Episodic low back pain    Past Surgical History  Procedure Laterality Date  . Revision urostomy cutaneous    . Abdominal hysterectomy    . Cholecystectomy    . Bilateral carpal tunnel release    . Breast surgery    . Esophagogastroduodenoscopy    . Colonoscopy       Medications: Prior to Admission medications   Medication Sig Start Date End Date Taking? Authorizing Provider  albuterol (PROVENTIL HFA;VENTOLIN HFA) 108 (90 BASE) MCG/ACT inhaler Inhale 2 puffs into the lungs every 4 (four) hours as needed for wheezing or shortness of breath.   Yes Historical Provider, MD  amLODipine (NORVASC) 5 MG tablet Take 10 mg by mouth daily.    Yes Historical Provider, MD  aspirin EC 81 MG tablet Take 81 mg by mouth daily.   Yes Historical Provider, MD  budesonide-formoterol (SYMBICORT) 80-4.5 MCG/ACT inhaler Inhale 2 puffs into the lungs 2 (two) times daily.   Yes Historical Provider, MD  cholecalciferol (VITAMIN D) 1000 UNITS tablet Take 1,000 Units by mouth daily.   Yes Historical Provider, MD  colestipol (COLESTID) 1 G tablet Take 1 g by mouth daily.    Yes Historical Provider, MD  cyanocobalamin 1000 MCG tablet Take 1,000 mcg by mouth daily.    Yes Historical Provider, MD  FLUoxetine (PROZAC) 20 MG capsule Take 40 mg by mouth daily.   Yes Historical Provider, MD  gabapentin (NEURONTIN) 300 MG capsule Take 600 mg by mouth 2 (two) times daily.   Yes Historical Provider, MD  hydrocortisone (ANUSOL-HC) 2.5 % rectal cream Place 1 application rectally 3 (three) times daily as needed for hemorrhoids.    Yes Historical Provider, MD  insulin regular human CONCENTRATED (HUMULIN R) 500 UNIT/ML injection Inject 8-15 Units into the skin 3 (three) times daily. Take 10 units  at breakfast, 15 units at lunch and 8 units at dinner.   Yes Historical Provider, MD  isosorbide mononitrate (IMDUR) 60 MG 24 hr tablet Take 60 mg by mouth daily.   Yes Historical Provider, MD  LIRAGLUTIDE [place] Inject 1.8 mg into the skin daily.   Yes Historical Provider, MD  lisinopril (PRINIVIL,ZESTRIL) 2.5 MG tablet Take 2.5 mg by mouth daily.   Yes Historical Provider, MD  loperamide (IMODIUM A-D) 2 MG tablet Take 2 mg by mouth 4 (four) times daily as needed for diarrhea or loose stools.   Yes Historical Provider, MD  magnesium oxide (MAG-OX) 400 MG tablet Take 400 mg by mouth 2 (two) times daily.   Yes Historical Provider, MD  metFORMIN (GLUCOPHAGE-XR) 500 MG 24 hr tablet Take 1,000 mg by mouth daily with breakfast.   Yes Historical Provider, MD  metoprolol succinate (TOPROL-XL) 100 MG 24 hr tablet Take 100 mg by mouth daily. Take with or immediately following a meal.   Yes Historical Provider, MD  Multiple Vitamin (MULTIVITAMIN WITH MINERALS) TABS tablet Take 1 tablet by mouth daily.   Yes  Historical Provider, MD  pantoprazole (PROTONIX) 40 MG tablet Take 40 mg by mouth daily.   Yes Historical Provider, MD  pravastatin (PRAVACHOL) 20 MG tablet Take 20 mg by mouth at bedtime.   Yes Historical Provider, MD  promethazine (PHENERGAN) 25 MG tablet Take 25 mg by mouth every 6 (six) hours as needed for nausea or vomiting.   Yes Historical Provider, MD  ranitidine (ZANTAC) 300 MG tablet Take 300 mg by mouth at bedtime.   Yes Historical Provider, MD  temazepam (RESTORIL) 7.5 MG capsule Take 7.5 mg by mouth at bedtime as needed for sleep.   Yes Historical Provider, MD  traZODone (DESYREL) 50 MG tablet Take 50 mg by mouth at bedtime.   Yes Historical Provider, MD  triamcinolone cream (KENALOG) 0.5 % Apply 1 application topically 2 (two) times daily as needed (for skin irritation).    Yes Historical Provider, MD  cephALEXin (KEFLEX) 500 MG capsule Take 2 capsules (1,000 mg total) by mouth 3 (three) times  daily. Patient not taking: Reported on 06/24/2015 12/28/14   Richardean Canalavid H Yao, MD  fluticasone [facility](FLONASE) 50 MCG/ACT nasal spray Place 2 sprays into both nostrils daily as needed for allergies or rhinitis.    Historical Provider, MD  glucagon (GLUCAGON EMERGENCY) 1 MG injection Inject 1 mg into the vein once as needed (severe insulin reaction).    Historical Provider, MD  lidocaine (XYLOCAINE) 5 % ointment Apply 1 application topically 4 (four) times daily as needed for moderate pain.    Historical Provider, MD  metroNIDAZOLE (FLAGYL) 500 MG tablet Take 1 tablet (500 mg total) by mouth 2 (two) times daily. Patient not taking: Reported on 06/24/2015 12/28/14   Richardean Canalavid H Yao, MD  nitroGLYCERIN (NITROSTAT) 0.4 MG SL tablet Place 0.4 mg under the tongue every 5 (five) minutes as needed for chest pain.    Historical Provider, MD  Nitroglycerin (RECTIV) 0.4 % OINT Place 1 application rectally 2 (two) times daily as needed (for chest pain.).     Historical Provider, MD  oxyCODONE-acetaminophen (PERCOCET) 5-325 MG per tablet Take 1-2 tablets by mouth every 6 (six) hours as needed. Patient not taking: Reported on 06/24/2015 12/28/14   Richardean Canalavid H Yao, MD    Allergies:   Allergies  Allergen Reactions  . Celebrex [Celecoxib] Itching  . Ciprofloxacin Other (See Comments)    No history of rash or SOB. Had kidney problems when she took cipro but was not told she had interstitial nephritis.  Marland Kitchen. Cymbalta [Duloxetine Hcl]     GI upset / constipation.   . Dilaudid [Hydromorphone Hcl] Itching  . Diuretic [Buchu-[place]sil[place]-Hydran] Other (See Comments)    Dehydration. Elevated creatini  . Fentanyl And Related Itching  . Glucophage [Metformin Hcl] Other (See Comments)    Stopped due to increase creat  . Lyrica [Pregabalin] Other (See Comments)    CNS disorder. Headache  . Oxycodone Itching  . Morphine And Related Rash  . Sulfa Antibiotics Rash  . Vicodin [Hydrocodone-Acetaminophen] Rash    Social  History:  Ambulatory   independently  Lives at home   With family     reports that she has quit smoking. She does not have any smokeless tobacco history on file. She reports that she does not drink alcohol or use illicit drugs.     Family History: family history includes CAD in her mother; Cancer in her father; Stroke in her brother.    Physical Exam: Patient Vitals for the past 24 hrs:  BP Temp Temp  src Pulse Resp SpO2  06/24/15 2159 125/66 mmHg 98.4 F (36.9 C) Oral 86 18 96 %  06/24/15 1948 125/69 mmHg - - 72 18 95 %  06/24/15 1718 131/79 mmHg 98.1 F (36.7 C) Oral 91 18 96 %    1. General:  in No Acute distress 2. Psychological: Alert and   Oriented 3. Head/ENT:     Dry Mucous Membranes                          Head Non traumatic, neck supple                          Normal  Dentition 4. SKIN:  decreased Skin turgor,  Skin clean Dry  area of induration and drainage of left buttock perianally    5. Heart: Regular rate and rhythm no Murmur, Rub or gallop 6. Lungs:  Clear to auscultation bilaterally, no wheezes or crackles  distant 7. Abdomen: Soft, non-tender, Non distended obese  8. Lower extremities: no clubbing, cyanosis, or edema 9. Neurologically Grossly intact, moving all 4 extremities equally 10. MSK: Normal range of motion  body mass index is unknown because there is no height or weight on file.   Labs on Admission:   Results for orders placed or performed during the hospital encounter of 06/24/15 (from the past 24 hour(s))  CBC with Differential     Status: Abnormal   Collection Time: 06/24/15  6:39 PM  Result Value Ref Range   WBC 16.8 (H) 4.0 - 10.5 K/uL   RBC 3.88 3.87 - 5.11 MIL/uL   Hemoglobin 12.4 12.0 - 15.0 g/dL   HCT 16.1 (L) 09.6 - 04.5 %   MCV 91.5 78.0 - 100.0 fL   MCH 32.0 26.0 - 34.0 pg   MCHC 34.9 30.0 - 36.0 g/dL   RDW 40.9 81.1 - 91.4 %   Platelets 412 (H) 150 - 400 K/uL   Neutrophils Relative % 70 %   Neutro Abs 11.8 (H) 1.7 - 7.7  K/uL   Lymphocytes Relative 18 %   Lymphs Abs 3.1 0.7 - 4.0 K/uL   Monocytes Relative 9 %   Monocytes Absolute 1.5 (H) 0.1 - 1.0 K/uL   Eosinophils Relative 3 %   Eosinophils Absolute 0.4 0.0 - 0.7 K/uL   Basophils Relative 0 %   Basophils Absolute 0.0 0.0 - 0.1 K/uL  Comprehensive metabolic panel     Status: Abnormal   Collection Time: 06/24/15  6:39 PM  Result Value Ref Range   Sodium 141 135 - 145 mmol/L   Potassium 3.7 3.5 - 5.1 mmol/L   Chloride 103 101 - 111 mmol/L   CO2 27 22 - 32 mmol/L   Glucose, Bld 92 65 - 99 mg/dL   BUN 17 6 - 20 mg/dL   Creatinine, Ser 7.82 (H) 0.44 - 1.00 mg/dL   Calcium 9.6 8.9 - 95.6 mg/dL   Total Protein 8.1 6.5 - 8.1 g/dL   Albumin 4.0 3.5 - 5.0 g/dL   AST 19 15 - 41 U/L   ALT 18 14 - 54 U/L   Alkaline Phosphatase 75 38 - 126 U/L   Total Bilirubin 0.7 0.3 - 1.2 mg/dL   GFR calc non Af Amer 51 (L) >60 mL/min   GFR calc Af Amer 59 (L) >60 mL/min   Anion gap 11 5 - 15  I-Stat CG4 Lactic Acid, ED  Status: None   Collection Time: 06/24/15  6:48 PM  Result Value Ref Range   Lactic Acid, Venous 0.84 0.5 - 2.0 mmol/L    UA   evidence of UTI lab work from [employer]   No results found for: HGBA1C  CrCl cannot be calculated (Unknown ideal weight.).  BNP (last 3 results) No results for input(s): PROBNP in the last 8760 hours.  Other results:  I have pearsonaly reviewed this: ECG REPORT  not obtained  There were no vitals filed for this visit.   Cultures:    Component Value Date/Time   SDES URINE, CATHETERIZED 12/28/2014 2013   SPECREQUEST NONE 12/28/2014 2013   CULT  12/28/2014 2013    >=100,000 COLONIES/mL SERRATIA MARCESCENS Performed at [facility]    REPTSTATUS 01/01/2015 FINAL 12/28/2014 2013     Radiological Exams on Admission: Ct Pelvis W Contrast  06/24/2015  CLINICAL DATA:  Weeping portal at the left lower buttocks, near the rectum. Pain and swelling. Initial encounter. EXAM: CT PELVIS WITH CONTRAST TECHNIQUE:  Multidetector CT imaging of the pelvis was performed using the standard protocol following the bolus administration of intravenous contrast. CONTRAST:  OMNIPAQUE IOHEXOL 300 MG/ML  SOLN COMPARISON:  None. FINDINGS: Focal soft tissue inflammation is noted along the left buttock, tracking along the proximal left thigh. There is suggestion of an associated small area of fluid measuring 2.3 x 1.4 cm, though no well defined abscess is seen. This may simply reflect more focal soft tissue edema. The patient's urostomy is grossly unremarkable in appearance. Visualized small and large bowel loops are grossly unremarkable, aside from diverticulosis along the sigmoid colon. No acute osseous abnormalities are identified. Scattered vascular calcifications are seen. IMPRESSION: 1. Focal soft tissue inflammation along the left buttock, tracking along the proximal left thigh. Suggestion of associated small area of fluid measuring 2.3 x 1.4 cm, though no well defined abscess is seen. This may simply reflect more focal soft tissue edema. 2. Diverticulosis along the sigmoid colon. 3. Scattered vascular calcifications seen. Electronically Signed   By: Roanna Raider M.D.   On: 06/24/2015 21:30    Chart has been reviewed  Family  Not at  Bedside, Boyfriend at bedside only     Assessment/Plan  71 year old diabetic female with cellulitis and perianal early abscess being admitted for IV antibiotics  Present on Admission:  . Cellulitis of buttock and /or Perianal abscess - CT scan indeterminate. There is some drainage of PERRLA material noted. We will need surgical consult to see if her is any need for OR I&D at this point initiate broad spectrum antibiotics blood cultures pending  . 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 CAD (coronary artery disease) - stable continue aspirin, statin, beta blocker  . Essential  hypertension stable continue home medications  . Asthma mild stable continue home medications   . UTI (lower urinary tract infection) - versus conization given ileostomy should be covered by broad spectrum antibiotics will await results of urine culture  . Candidal esophagitis (HCC) continue nystatin swish and swallow   Prophylaxis:   Lovenox   CODE STATUS:  FULL CODE  as per patient    Disposition: To home once workup is complete and patient is stable  Other plan as per orders.  I have spent a total of 55 min on this admission    Hulbert Branscome 06/25/2015, 12:39 AM  [facility]  Pager [phone]   after 2 AM please page floor coverage PA If 7AM-7PM, please contact the day team taking care of the patient  [email or web address]  Password [email or web address]

## 2015-06-25 ENCOUNTER — Encounter (HOSPITAL_COMMUNITY)
Admission: EM | Disposition: A | Discharge status: Home / Self Care | Payer: Self-pay | Source: Home / Self Care | Attending: Internal Medicine

## 2015-06-25 ENCOUNTER — Inpatient Hospital Stay (HOSPITAL_COMMUNITY): Payer: Medicare Other | Admitting: Anesthesiology

## 2015-06-25 ENCOUNTER — Encounter (HOSPITAL_COMMUNITY): Payer: Self-pay | Admitting: Certified Registered Nurse Anesthetist

## 2015-06-25 HISTORY — PX: INCISION AND DRAINAGE ABSCESS: SHX5864

## 2015-06-25 LAB — CBC
HCT: 34.6 % — ABNORMAL LOW (ref 36.0–46.0)
Hemoglobin: 11.4 g/dL — ABNORMAL LOW (ref 12.0–15.0)
MCH: 31.6 pg (ref 26.0–34.0)
MCHC: 32.9 g/dL (ref 30.0–36.0)
MCV: 95.8 fL (ref 78.0–100.0)
PLATELETS: 381 10*3/uL (ref 150–400)
RBC: 3.61 MIL/uL — ABNORMAL LOW (ref 3.87–5.11)
RDW: 13.6 % (ref 11.5–15.5)
WBC: 12.1 10*3/uL — ABNORMAL HIGH (ref 4.0–10.5)

## 2015-06-25 LAB — URINALYSIS, ROUTINE W REFLEX MICROSCOPIC
Bilirubin Urine: NEGATIVE
GLUCOSE, UA: NEGATIVE mg/dL
KETONES UR: NEGATIVE mg/dL
Nitrite: POSITIVE — AB
PROTEIN: NEGATIVE mg/dL
Specific Gravity, Urine: 1.03 (ref 1.005–1.030)
pH: 6.5 (ref 5.0–8.0)

## 2015-06-25 LAB — URINE MICROSCOPIC-ADD ON: SQUAMOUS EPITHELIAL / LPF: NONE SEEN

## 2015-06-25 LAB — COMPREHENSIVE METABOLIC PANEL
ALK PHOS: 60 U/L (ref 38–126)
ALT: 15 U/L (ref 14–54)
AST: 17 U/L (ref 15–41)
Albumin: 3.1 g/dL — ABNORMAL LOW (ref 3.5–5.0)
Anion gap: 9 (ref 5–15)
BUN: 11 mg/dL (ref 6–20)
CALCIUM: 8.5 mg/dL — AB (ref 8.9–10.3)
CO2: 25 mmol/L (ref 22–32)
CREATININE: 0.84 mg/dL (ref 0.44–1.00)
Chloride: 105 mmol/L (ref 101–111)
GFR calc non Af Amer: >60 mL/min (ref >60)
GLUCOSE: 91 mg/dL (ref 65–99)
Potassium: 3.3 mmol/L — ABNORMAL LOW (ref 3.5–5.1)
SODIUM: 139 mmol/L (ref 135–145)
Total Bilirubin: 0.2 mg/dL — ABNORMAL LOW (ref 0.3–1.2)
Total Protein: 6.6 g/dL (ref 6.5–8.1)

## 2015-06-25 LAB — TSH: TSH: 1.6 u[IU]/mL (ref 0.350–4.500)

## 2015-06-25 LAB — GLUCOSE, CAPILLARY
GLUCOSE-CAPILLARY: 241 mg/dL — AB (ref 65–99)
Glucose-Capillary: 99 mg/dL (ref 65–99)
Glucose-Capillary: 92 mg/dL (ref 65–99)

## 2015-06-25 LAB — SURGICAL PCR SCREEN
MRSA, PCR: POSITIVE — AB
Staphylococcus aureus: POSITIVE — AB

## 2015-06-25 LAB — MAGNESIUM: Magnesium: 1.4 mg/dL — ABNORMAL LOW (ref 1.7–2.4)

## 2015-06-25 LAB — PHOSPHORUS: Phosphorus: 3.4 mg/dL (ref 2.5–4.6)

## 2015-06-25 SURGERY — INCISION AND DRAINAGE, ABSCESS
Anesthesia: General | Site: Buttocks | Laterality: Left

## 2015-06-25 MED ORDER — LACTATED RINGERS IV SOLN: INTRAVENOUS | Status: DC

## 2015-06-25 MED ORDER — MUPIROCIN 2 % EX OINT
1.0000 "application " | TOPICAL_OINTMENT | Freq: Two times a day (BID) | CUTANEOUS | Status: AC
  Administered 2015-06-25 – 2015-06-29 (×9): 1 via NASAL
  Filled 2015-06-25: qty 22

## 2015-06-25 MED ORDER — HYDROMORPHONE HCL 1 MG/ML IJ SOLN
0.5000 mg | INTRAMUSCULAR | Status: DC | PRN
  Administered 2015-06-25 – 2015-06-26 (×2): 1 mg via INTRAVENOUS
  Administered 2015-06-26: 0.5 mg via INTRAVENOUS
  Administered 2015-06-27 (×3): 1 mg via INTRAVENOUS
  Administered 2015-06-29: 0.5 mg via INTRAVENOUS
  Administered 2015-06-29: 1 mg via INTRAVENOUS
  Filled 2015-06-25 (×9): qty 1

## 2015-06-25 MED ORDER — DEXAMETHASONE SODIUM PHOSPHATE 10 MG/ML IJ SOLN
INTRAMUSCULAR | Status: DC | PRN
  Administered 2015-06-25: 5 mg via INTRAVENOUS

## 2015-06-25 MED ORDER — ENOXAPARIN SODIUM 40 MG/0.4ML ~~LOC~~ SOLN
40.0000 mg | Freq: Every day | SUBCUTANEOUS | Status: DC
Start: 2015-06-25 — End: 2015-06-27
  Administered 2015-06-25 – 2015-06-26 (×2): 40 mg via SUBCUTANEOUS
  Filled 2015-06-25 (×3): qty 0.4

## 2015-06-25 MED ORDER — LIDOCAINE HCL (CARDIAC) 20 MG/ML IV SOLN
INTRAVENOUS | Status: DC | PRN
  Administered 2015-06-25: 100 mg via INTRAVENOUS

## 2015-06-25 MED ORDER — HYDROMORPHONE HCL 1 MG/ML IJ SOLN
0.2500 mg | INTRAMUSCULAR | Status: DC | PRN
  Administered 2015-06-25 (×2): 0.5 mg via INTRAVENOUS

## 2015-06-25 MED ORDER — HYDROMORPHONE HCL 1 MG/ML IJ SOLN
INTRAMUSCULAR | Status: AC
  Filled 2015-06-25: qty 1

## 2015-06-25 MED ORDER — SODIUM CHLORIDE 0.9 % IV SOLN
1500.0000 mg | Freq: Once | INTRAVENOUS | Status: AC
  Administered 2015-06-25: 1500 mg via INTRAVENOUS
  Filled 2015-06-25: qty 1500

## 2015-06-25 MED ORDER — 0.9 % SODIUM CHLORIDE (POUR BTL) OPTIME
TOPICAL | Status: DC | PRN
  Administered 2015-06-25: 1000 mL

## 2015-06-25 MED ORDER — ALBUTEROL SULFATE HFA 108 (90 BASE) MCG/ACT IN AERS
INHALATION_SPRAY | RESPIRATORY_TRACT | Status: AC
  Filled 2015-06-25: qty 6.7

## 2015-06-25 MED ORDER — FENTANYL CITRATE (PF) 250 MCG/5ML IJ SOLN
INTRAMUSCULAR | Status: AC
  Filled 2015-06-25: qty 5

## 2015-06-25 MED ORDER — BUPIVACAINE-EPINEPHRINE (PF) 0.25% -1:200000 IJ SOLN
INTRAMUSCULAR | Status: AC
  Filled 2015-06-25: qty 30

## 2015-06-25 MED ORDER — SUCCINYLCHOLINE CHLORIDE 20 MG/ML IJ SOLN
INTRAMUSCULAR | Status: DC | PRN
  Administered 2015-06-25: 100 mg via INTRAVENOUS

## 2015-06-25 MED ORDER — ONDANSETRON HCL 4 MG/2ML IJ SOLN
INTRAMUSCULAR | Status: DC | PRN
  Administered 2015-06-25: 4 mg via INTRAVENOUS

## 2015-06-25 MED ORDER — PROPOFOL 10 MG/ML IV BOLUS
INTRAVENOUS | Status: AC
  Filled 2015-06-25: qty 20

## 2015-06-25 MED ORDER — PROMETHAZINE HCL 25 MG/ML IJ SOLN: 6.2500 mg | INTRAMUSCULAR | Status: DC | PRN

## 2015-06-25 MED ORDER — LACTATED RINGERS IV SOLN
INTRAVENOUS | Status: DC | PRN
  Administered 2015-06-25: 11:00:00 via INTRAVENOUS

## 2015-06-25 MED ORDER — FENTANYL CITRATE (PF) 100 MCG/2ML IJ SOLN
INTRAMUSCULAR | Status: DC | PRN
  Administered 2015-06-25: 50 ug via INTRAVENOUS

## 2015-06-25 MED ORDER — MIDAZOLAM HCL 2 MG/2ML IJ SOLN
INTRAMUSCULAR | Status: AC
  Filled 2015-06-25: qty 2

## 2015-06-25 MED ORDER — ALBUTEROL SULFATE HFA 108 (90 BASE) MCG/ACT IN AERS
INHALATION_SPRAY | RESPIRATORY_TRACT | Status: DC | PRN
  Administered 2015-06-25: 5 via RESPIRATORY_TRACT

## 2015-06-25 MED ORDER — DIPHENHYDRAMINE HCL 50 MG/ML IJ SOLN
12.5000 mg | Freq: Four times a day (QID) | INTRAMUSCULAR | Status: DC | PRN
  Administered 2015-06-25 – 2015-06-27 (×3): 12.5 mg via INTRAVENOUS
  Filled 2015-06-25 (×3): qty 1

## 2015-06-25 MED ORDER — ONDANSETRON HCL 4 MG/2ML IJ SOLN
INTRAMUSCULAR | Status: AC
  Filled 2015-06-25: qty 2

## 2015-06-25 MED ORDER — ALBUTEROL SULFATE (2.5 MG/3ML) 0.083% IN NEBU: 2.5000 mg | INHALATION_SOLUTION | RESPIRATORY_TRACT | Status: DC | PRN

## 2015-06-25 MED ORDER — MIDAZOLAM HCL 5 MG/5ML IJ SOLN
INTRAMUSCULAR | Status: DC | PRN
  Administered 2015-06-25: 2 mg via INTRAVENOUS

## 2015-06-25 MED ORDER — PROPOFOL 10 MG/ML IV BOLUS
INTRAVENOUS | Status: DC | PRN
  Administered 2015-06-25: 200 mg via INTRAVENOUS

## 2015-06-25 MED ORDER — CHLORHEXIDINE GLUCONATE CLOTH 2 % EX PADS
6.0000 | MEDICATED_PAD | Freq: Every day | CUTANEOUS | Status: DC
  Administered 2015-06-26 – 2015-06-29 (×4): 6 via TOPICAL

## 2015-06-25 MED ORDER — KETOROLAC TROMETHAMINE 15 MG/ML IJ SOLN
15.0000 mg | Freq: Four times a day (QID) | INTRAMUSCULAR | Status: DC
  Administered 2015-06-25 – 2015-06-26 (×3): 15 mg via INTRAVENOUS
  Filled 2015-06-25 (×7): qty 1

## 2015-06-25 MED ORDER — BUPIVACAINE-EPINEPHRINE 0.25% -1:200000 IJ SOLN
INTRAMUSCULAR | Status: DC | PRN
  Administered 2015-06-25: 10 mL

## 2015-06-25 MED ORDER — LIDOCAINE HCL (CARDIAC) 20 MG/ML IV SOLN
INTRAVENOUS | Status: AC
Start: 2015-06-25 — End: 2015-06-25
  Filled 2015-06-25: qty 5

## 2015-06-25 MED ORDER — SODIUM CHLORIDE 0.9 % IV SOLN
1500.0000 mg | INTRAVENOUS | Status: DC
  Administered 2015-06-25 – 2015-06-26 (×2): 1500 mg via INTRAVENOUS
  Filled 2015-06-25 (×2): qty 1500

## 2015-06-25 SURGICAL SUPPLY — 30 items
BNDG GAUZE ELAST 4 BULKY (GAUZE/BANDAGES/DRESSINGS) IMPLANT
COVER SURGICAL LIGHT HANDLE (MISCELLANEOUS) IMPLANT
DECANTER SPIKE VIAL GLASS SM (MISCELLANEOUS) IMPLANT
DRAPE LAPAROSCOPIC ABDOMINAL (DRAPES) IMPLANT
DRSG PAD ABDOMINAL 8X10 ST (GAUZE/BANDAGES/DRESSINGS) IMPLANT
ELECT REM PT RETURN 9FT ADLT (ELECTROSURGICAL) ×3
ELECTRODE REM PT RTRN 9FT ADLT (ELECTROSURGICAL) ×1 IMPLANT
GAUZE SPONGE 4X4 12PLY STRL (GAUZE/BANDAGES/DRESSINGS) ×3 IMPLANT
GLOVE BIO SURGEON STRL SZ7.5 (GLOVE) ×3 IMPLANT
GLOVE ECLIPSE 7.5 STRL STRAW (GLOVE) ×3 IMPLANT
GOWN STRL REUS W/TWL XL LVL3 (GOWN DISPOSABLE) ×6 IMPLANT
KIT BASIN OR (CUSTOM PROCEDURE TRAY) ×3 IMPLANT
LEGGING LITHOTOMY PAIR STRL (DRAPES) ×3 IMPLANT
NEEDLE HYPO 25X1 1.5 SAFETY (NEEDLE) IMPLANT
NS IRRIG 1000ML POUR BTL (IV SOLUTION) ×3 IMPLANT
PACK GENERAL/GYN (CUSTOM PROCEDURE TRAY) ×3 IMPLANT
PACK LITHOTOMY IV (CUSTOM PROCEDURE TRAY) ×3 IMPLANT
PAD ABD 8X10 STRL (GAUZE/BANDAGES/DRESSINGS) ×3 IMPLANT
SPONGE LAP 18X18 X RAY DECT (DISPOSABLE) IMPLANT
SUT MNCRL AB 4-0 PS2 18 (SUTURE) IMPLANT
SUT SILK 3 0 SH 30 (SUTURE) ×6 IMPLANT
SUT VIC AB 3-0 SH 27 (SUTURE)
SUT VIC AB 3-0 SH 27XBRD (SUTURE) IMPLANT
SWAB COLLECTION DEVICE MRSA (MISCELLANEOUS) IMPLANT
SWAB CULTURE ESWAB REG 1ML (MISCELLANEOUS) IMPLANT
SYR BULB IRRIGATION 50ML (SYRINGE) ×3 IMPLANT
SYR CONTROL 10ML LL (SYRINGE) IMPLANT
TOWEL OR 17X26 10 PK STRL BLUE (TOWEL DISPOSABLE) ×3 IMPLANT
TOWEL OR NON WOVEN STRL DISP B (DISPOSABLE) IMPLANT
YANKAUER SUCT BULB TIP NO VENT (SUCTIONS) ×3 IMPLANT

## 2015-06-25 NOTE — Transfer of Care (Signed)
Immediate Anesthesia Transfer of Care Note  Patient: Laurie [name]  Procedure(s) Performed: Procedure(s): INCISION AND DRAINAGE ABSCESS (Left)  Patient Location: PACU  Anesthesia Type:General  Level of Consciousness:  sedated, patient cooperative and responds to stimulation  Airway & Oxygen Therapy:Patient Spontanous Breathing and Patient connected to face mask oxgen  Post-op Assessment:  Report given to PACU RN and Post -op Vital signs reviewed and stable  Post vital signs:  Reviewed and stable  Last Vitals:  Filed Vitals:   06/25/15 0912 06/25/15 1145  BP: 127/83 137/79  Pulse: 88   Temp: 36.7 C 36.6 C  Resp: 18 28    Complications: No apparent anesthesia complications

## 2015-06-25 NOTE — Anesthesia Procedure Notes (Signed)
Procedure Name: Intubation Date/Time: 06/25/2015 11:06 AM Performed by: Epimenio SarinJARVELA, Cyndy Braver R Pre-anesthesia Checklist: Patient identified, Emergency Drugs available, Suction available, Patient being monitored and Timeout performed Patient Re-evaluated:Patient Re-evaluated prior to inductionOxygen Delivery Method: Circle system utilized Preoxygenation: Pre-oxygenation with 100% oxygen Intubation Type: IV induction, Rapid sequence and Cricoid Pressure applied Laryngoscope Size: Mac and 3 Grade View: Grade I Tube type: Oral Tube size: 7.5 mm Number of attempts: 1 Airway Equipment and Method: Stylet Placement Confirmation: ETT inserted through vocal cords under direct vision,  positive ETCO2 and breath sounds checked- equal and bilateral Secured at: 21 cm Tube secured with: Tape Dental Injury: Teeth and Oropharynx as per pre-operative assessment

## 2015-06-25 NOTE — Progress Notes (Signed)
Patient Demographics  Laurie [name], is a 60 y.o. female, DOB - 10-21-1955, ONG:[id number]  Admit date - 06/24/2015   Admitting Physician Therisa Doyne, MD  Outpatient Primary MD for the patient is No primary care provider on file.  LOS - 1   Chief Complaint  Patient presents with  . Abscess       Admission HPI/Brief narrative: 60 year old female with history of diabetes mellitus, coronary artery disease, hypertension presents with left buttock abscess and cellulitis, seen by surgery, status post IND on 3/18.  Subjective:   Laurie [name] today has, No headache, No chest pain, No abdominal pain - No Nausea, And planes of pain at the buttocks area.  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  Diabetes mellitus - Continue with insulin sliding scale, continue with Lantus  UTI - Continue with IV antibiotics, follow on urine cultures  Coronary artery disease - Denies any chest pain or shortness of breath, continue with home medication including aspirin, statin and beta blockers  Hypertension - Continue with home medication  Asthma - Appears to be stable, continue with home medication  Candidal esophagitis - Continue with nystatin swish and swallow    Code Status: Full  Family Communication: Discussed with patient  Disposition Plan: Home once stable   Procedures  Incision and drainage of left buttock abscess   Consults     Gen. surgery   Medications  Scheduled Meds: . amLODipine  10 mg Oral Daily  . aspirin EC  81 mg Oral Daily  . Chlorhexidine Gluconate Cloth  6 each Topical Q0600  . enoxaparin (LOVENOX) injection  30 mg Subcutaneous QHS  . FLUoxetine  40 mg Oral Daily  . gabapentin  600 mg Oral BID  . HYDROmorphone      . insulin aspart  0-5 Units Subcutaneous QHS  . insulin aspart  0-9 Units Subcutaneous TID WC  . insulin glargine  10 Units Subcutaneous QHS  . isosorbide mononitrate  60 mg Oral Daily  . lisinopril  2.5 mg Oral Daily  . metoprolol succinate  100 mg Oral Daily  . mometasone-formoterol  2 puff Inhalation BID  . mupirocin ointment  1 application Nasal BID  . nystatin  5 mL Oral QID  . pantoprazole  40 mg Oral Daily  . piperacillin-tazobactam (ZOSYN)  IV  3.375 g Intravenous 3 times per day  . pravastatin  20 mg Oral QHS  . traZODone  50 mg Oral QHS  . vancomycin  1,500 mg Intravenous Q24H   Continuous Infusions:  PRN Meds:.acetaminophen **OR** acetaminophen, albuterol, diphenhydrAMINE, HYDROmorphone (DILAUDID) injection, ondansetron **OR** ondansetron (ZOFRAN) IV, temazepam  DVT Prophylaxis  Lovenox   Lab Results  Component Value Date   PLT 381 06/25/2015    Antibiotics    Anti-infectives  Start     Dose/Rate Route Frequency Ordered Stop   06/25/15 2200  vancomycin (VANCOCIN) 1,500 mg in sodium chloride 0.9 % 500 mL IVPB     1,500 mg 250 mL/hr over 120 Minutes Intravenous Every 24 hours 06/25/15 0026     06/25/15 0030  vancomycin (VANCOCIN) 1,500 mg in sodium chloride 0.9 % 500 mL IVPB     1,500 mg 250 mL/hr over 120 Minutes Intravenous  Once 06/25/15 0025 06/25/15 0258   06/24/15 2345  piperacillin-tazobactam (ZOSYN) IVPB 3.375 g     3.375 g 12.5 mL/hr over 240 Minutes Intravenous 3 times per day 06/24/15 2332     06/24/15 2200  ceFAZolin (ANCEF) IVPB 1 g/50 mL premix     1 g 100 mL/hr over 30 Minutes Intravenous  Once 06/24/15 2151 06/24/15 2250           Objective:   Filed Vitals:   06/25/15 1215 06/25/15 1220 06/25/15 1223 06/25/15 1245  BP: 134/65 133/79 139/75 127/74  Pulse: 80 82 79 81  Temp: 97.8 F (36.6 C)   98.3 F (36.8 C)  TempSrc:      Resp: Height:      Weight:      SpO2: 91% 96% 97% 98%    Wt Readings from Last 3 Encounters:  06/25/15 94.666 kg (208 lb 11.2 oz)     Intake/Output Summary (Last 24 hours) at 06/25/15 1336 Last data filed at 06/25/15 1200  Gross per 24 hour  Intake    850 ml  Output   2160 ml  Net  -1310 ml     Physical Exam  Awake Alert, Oriented X 3, No new F.N deficits, Normal affect [place].AT,PERRAL Supple Neck,No JVD, No cervical lymphadenopathy appriciated.  Symmetrical Chest wall movement, Good air movement bilaterally, CTAB RRR,No Gallops,Rubs or new Murmurs, No Parasternal Heave +ve B.Sounds, Abd Soft, No tenderness, No organomegaly appriciated, No rebound - guarding or rigidity. No Cyanosis, Clubbing or edema, No new Rash or bruise  , patient had area of left buttock induration and erythema (exam was done prior  To surgery)   Data Review   Micro Results Recent Results (from the past 240 hour(s))  Surgical pcr screen     Status: Abnormal   Collection Time: 06/25/15  9:10 AM  Result Value Ref Range Status   MRSA, PCR POSITIVE (A) NEGATIVE Final    Comment: RESULT CALLED TO, READ BACK BY AND VERIFIED WITH: P RAMIREZ AT 1100 ON 03.18.2017 BY NBROOKS    Staphylococcus aureus POSITIVE (A) NEGATIVE Final    Comment:        The Xpert SA Assay (FDA approved for NASAL specimens in patients over 19 years of age), is one component of a comprehensive surveillance program.  Test performance has been validated by [facility] for patients greater than or equal to 1 year old. It is not intended to diagnose infection nor to guide or monitor treatment.     Radiology Reports Ct Pelvis W Contrast  06/24/2015  CLINICAL DATA:  Weeping portal at the left lower buttocks,  near the rectum. Pain and swelling. Initial encounter. EXAM: CT PELVIS WITH CONTRAST TECHNIQUE: Multidetector CT imaging of the pelvis was performed using the standard protocol following the bolus administration of intravenous contrast. CONTRAST:  OMNIPAQUE IOHEXOL 300 MG/ML  SOLN COMPARISON:  None. FINDINGS: Focal soft tissue inflammation is noted along the left buttock, tracking along the proximal left thigh. There is suggestion of an  associated small area of fluid measuring 2.3 x 1.4 cm, though no well defined abscess is seen. This may simply reflect more focal soft tissue edema. The patient's urostomy is grossly unremarkable in appearance. Visualized small and large bowel loops are grossly unremarkable, aside from diverticulosis along the sigmoid colon. No acute osseous abnormalities are identified. Scattered vascular calcifications are seen. IMPRESSION: 1. Focal soft tissue inflammation along the left buttock, tracking along the proximal left thigh. Suggestion of associated small area of fluid measuring 2.3 x 1.4 cm, though no well defined abscess is seen. This may simply reflect more focal soft tissue edema. 2. Diverticulosis along the sigmoid colon. 3. Scattered vascular calcifications seen. Electronically Signed   By: Roanna RaiderJeffery  Chang M.D.   On: 06/24/2015 21:30     CBC  Recent Labs Lab 06/24/15 1839 06/25/15 0506  WBC 16.8* 12.1*  HGB 12.4 11.4*  HCT 35.5* 34.6*  PLT 412* 381  MCV 91.5 95.8  MCH 32.0 31.6  MCHC 34.9 32.9  RDW 13.5 13.6  LYMPHSABS 3.1  --   MONOABS 1.5*  --   EOSABS 0.4  --   BASOSABS 0.0  --     Chemistries   Recent Labs Lab 06/24/15 1839 06/25/15 0506  NA 141 139  K 3.7 3.3*  CL 103 105  CO2 27 25  GLUCOSE 92 91  BUN 17 11  CREATININE 1.14* 0.84  CALCIUM 9.6 8.5*  MG  --  1.4*  AST 19 17  ALT 18 15  ALKPHOS 75 60  BILITOT 0.7 0.2*    ------------------------------------------------------------------------------------------------------------------ estimated creatinine clearance is 79.5 mL/min (by C-G formula based on Cr of 0.84). ------------------------------------------------------------------------------------------------------------------ No results for input(s): HGBA1C in the last 72 hours. ------------------------------------------------------------------------------------------------------------------ No results for input(s): CHOL, HDL, LDLCALC, TRIG, CHOLHDL, LDLDIRECT in the last 72 hours. ------------------------------------------------------------------------------------------------------------------  Recent Labs  06/25/15 0506  TSH 1.600   ------------------------------------------------------------------------------------------------------------------ No results for input(s): VITAMINB12, FOLATE, FERRITIN, TIBC, IRON, RETICCTPCT in the last 72 hours.  Coagulation profile No results for input(s): INR, PROTIME in the last 168 hours.  No results for input(s): DDIMER in the last 72 hours.  Cardiac Enzymes No results for input(s): CKMB, TROPONINI, MYOGLOBIN in the last 168 hours.  Invalid input(s): CK ------------------------------------------------------------------------------------------------------------------ Invalid input(s): POCBNP     Time Spent in minutes   30 minutes   Topaz Raglin M.D on 06/25/2015 at 1:36 PM  Between 7am to 7pm - Pager - [phone]  After 7pm go to [email or web address] - password [email or web address]  [facility]   Office  [phone]

## 2015-06-25 NOTE — Op Note (Addendum)
06/24/2015 - 06/25/2015  11:27 AM  PATIENT:  Laurie [name]  60 y.o. female  No care team member to display  PRE-OPERATIVE DIAGNOSIS:  left buttock abscess  POST-OPERATIVE DIAGNOSIS:  left buttock abscess  PROCEDURE:  INCISION AND DRAINAGE ABSCESS  SURGEON:  Surgeon(s): Romie LeveeAlicia Kipling Graser, MD  ASSISTANT: none   ANESTHESIA:   local and general  EBL:  Total I/O In: -  Out: 1500 [Urine:1500]  DRAINS: none   SPECIMEN:  Source of Specimen:  L buttock abscess  DISPOSITION OF SPECIMEN:  Microbiology  COUNTS:  YES  PLAN OF CARE: patient admitted  PATIENT DISPOSITION:  PACU - hemodynamically stable.  INDICATION: is a 60 year old female who presents to the emergency department with a left-sided gluteal abscess.   OR FINDINGS: Large infected carbuncle of the left perineum.  DESCRIPTION: the patient was identified in the preoperative holding area and taken to the OR where they were laid supine on the operating room table.  General anesthesia was induced without difficulty. SCDs were also noted to be in place prior to the initiation of anesthesia.  The patient was placed in lithotomy position.  The patient was then prepped and draped in the usual sterile fashion.   A surgical timeout was performed indicating the correct patient, procedure, positioning and need for preoperative antibiotics.    An incision was made around the carbuncle using a Bovie electrocautery. The entire area of induration was removed.  The specimen was sent to micro-biology for tissue culture. Hemostasis was achieved using Bovie electrocautery and a 3-0 silk suture for a bleeding vessel on the medial side of the wound.  A field block was performed using Marcaine with epinephrine. The area was then packed and dressed appropriately. The patient tolerated the procedure well and was sent to the post anesthesia care unit stable condition. All counts are correct operative staff.

## 2015-06-25 NOTE — Anesthesia Postprocedure Evaluation (Signed)
Anesthesia Post Note  Patient: Laurie [name]  Procedure(s) Performed: Procedure(s) (LRB): INCISION AND DRAINAGE ABSCESS (Left)  Patient location during evaluation: PACU Anesthesia Type: General Level of consciousness: awake and alert Pain management: pain level controlled Vital Signs Assessment: post-procedure vital signs reviewed and stable Respiratory status: spontaneous breathing, nonlabored ventilation, respiratory function stable and patient connected to nasal cannula oxygen Cardiovascular status: blood pressure returned to baseline and stable Postop Assessment: no signs of nausea or vomiting Anesthetic complications: no    Last Vitals:  Filed Vitals:   06/25/15 1200 06/25/15 1210  BP: 138/88 139/74  Pulse: 85 85  Temp:    Resp: 21 25    Last Pain:  Filed Vitals:   06/25/15 1212  PainSc: 9                  Shelton SilvasKevin D Kit Mollett

## 2015-06-25 NOTE — Consult Note (Signed)
Reason for Consult: Buttock abscess  Referring Physician: Adelfa Koh [name] Laurie is an 60 y.o. female.   HPI: I was asked to see this patient due to a left buttock abscess and cellulitis. She reports that she  Developed a tender lump on her left buttock about 4 days prior to admission.This got gradually larger and then 2 days ago began draining. The drainage increased yesterday  And she presented to the emergency department.  She denies fever or chills or constitutional symptoms. No similar problems in this area.  Past Medical History  Diagnosis Date  . Diabetes mellitus without complication ([place])   . IBS (irritable bowel syndrome)   . Interstitial cystitis   . Myofascial pain syndrome   . Carpal tunnel syndrome   . GI bleed   . Coronary artery disease   . B12 deficiency   . CKD (chronic kidney disease)   . Hypertension   . Iron deficiency anemia   . Glaucoma   . Hepatitis C   . Anal fissure   . Candidal esophagitis ([place])   . Obesity (BMI 35.0-39.9 without comorbidity) ([place])   . Diabetic gastropathy ([place])   . Plantar fasciitis   . GERD (gastroesophageal reflux disease)   . Asthma   . Chronic UTI   . Chronic abdominal pain   . Episodic low back pain     Past Surgical History  Procedure Laterality Date  . Revision urostomy cutaneous    . Abdominal hysterectomy    . Cholecystectomy    . Bilateral carpal tunnel release    . Breast surgery    . Esophagogastroduodenoscopy    . Colonoscopy      Family History  Problem Relation Age of Onset  . CAD Mother   . Cancer Father   . Stroke Brother     Social History:  reports that she has quit smoking. She does not have any smokeless tobacco history on file. She reports that she does not drink alcohol or use illicit drugs.  Allergies:  Allergies  Allergen Reactions  . Celebrex [Celecoxib] Itching  . Ciprofloxacin Other (See Comments)    No history of rash or SOB. Had kidney problems when she took cipro but was not  told she had interstitial nephritis.  Marland Kitchen Cymbalta [Duloxetine Hcl]     GI upset / constipation.   . Dilaudid [Hydromorphone Hcl] Itching  . Diuretic [B[place]-Hydran] Other (See Comments)    Dehydration. Elevated creatini  . Fentanyl And Related Itching  . Glucophage [Metformin Hcl] Other (See Comments)    Stopped due to increase creat  . Lyrica [Pregabalin] Other (See Comments)    CNS disorder. Headache  . Oxycodone Itching  . Morphine And Related Rash  . Sulfa Antibiotics Rash  . Vicodin [Hydrocodone-Acetaminophen] Rash    Current Facility-Administered Medications  Medication Dose Route Frequency Provider Last Rate Last Dose  . acetaminophen (TYLENOL) tablet 650 mg  650 mg Oral Q6H PRN Toy Baker, MD       Or  . acetaminophen (TYLENOL) suppository 650 mg  650 mg Rectal Q6H PRN Toy Baker, MD      . albuterol (PROVENTIL) (2.5 MG/3ML) 0.083% nebulizer solution 2.5 mg  2.5 mg Nebulization Q4H PRN Toy Baker, MD      . amLODipine (NORVASC) tablet 10 mg  10 mg Oral Daily Toy Baker, MD      . aspirin EC tablet 81 mg  81 mg Oral Daily Toy Baker, MD      .  enoxaparin (LOVENOX) injection 30 mg  30 mg Subcutaneous QHS Toy Baker, MD   30 mg at 06/25/15 0041  . FLUoxetine (PROZAC) capsule 40 mg  40 mg Oral Daily Toy Baker, MD      . gabapentin (NEURONTIN) capsule 600 mg  600 mg Oral BID Toy Baker, MD   600 mg at 06/25/15 0041  . insulin aspart (novoLOG) injection 0-5 Units  0-5 Units Subcutaneous QHS Toy Baker, MD   0 Units at 06/25/15 0015  . insulin aspart (novoLOG) injection 0-9 Units  0-9 Units Subcutaneous TID WC Anastassia Doutova, MD      . insulin glargine (LANTUS) injection 10 Units  10 Units Subcutaneous QHS Toy Baker, MD   10 Units at 06/25/15 0021  . isosorbide mononitrate (IMDUR) 24 hr tablet 60 mg  60 mg Oral Daily Toy Baker, MD      . lisinopril (PRINIVIL,ZESTRIL) tablet  2.5 mg  2.5 mg Oral Daily Toy Baker, MD      . metoprolol succinate (TOPROL-XL) 24 hr tablet 100 mg  100 mg Oral Daily Toy Baker, MD      . mometasone-formoterol (DULERA) 100-5 MCG/ACT inhaler 2 puff  2 puff Inhalation BID Toy Baker, MD   2 puff at 06/25/15 0020  . nystatin (MYCOSTATIN) 100000 UNIT/ML suspension 500,000 Units  5 mL Oral QID Toy Baker, MD   500,000 Units at 06/25/15 0041  . ondansetron (ZOFRAN) tablet 4 mg  4 mg Oral Q6H PRN Toy Baker, MD       Or  . ondansetron (ZOFRAN) injection 4 mg  4 mg Intravenous Q6H PRN Toy Baker, MD      . pantoprazole (PROTONIX) EC tablet 40 mg  40 mg Oral Daily Toy Baker, MD      . piperacillin-tazobactam (ZOSYN) IVPB 3.375 g  3.375 g Intravenous 3 times per day Toy Baker, MD   3.375 g at 06/25/15 2831  . pravastatin (PRAVACHOL) tablet 20 mg  20 mg Oral QHS Toy Baker, MD   20 mg at 06/25/15 0041  . temazepam (RESTORIL) capsule 7.5 mg  7.5 mg Oral QHS PRN Toy Baker, MD      . traZODone (DESYREL) tablet 50 mg  50 mg Oral QHS Toy Baker, MD   50 mg at 06/25/15 0041  . vancomycin (VANCOCIN) 1,500 mg in sodium chloride 0.9 % 500 mL IVPB  1,500 mg Intravenous Q24H Toy Baker, MD         Results for orders placed or performed during the hospital encounter of 06/24/15 (from the past 48 hour(s))  CBC with Differential     Status: Abnormal   Collection Time: 06/24/15  6:39 PM  Result Value Ref Range   WBC 16.8 (H) 4.0 - 10.5 K/uL   RBC 3.88 3.87 - 5.11 MIL/uL   Hemoglobin 12.4 12.0 - 15.0 g/dL   HCT 35.5 (L) 36.0 - 46.0 %   MCV 91.5 78.0 - 100.0 fL   MCH 32.0 26.0 - 34.0 pg   MCHC 34.9 30.0 - 36.0 g/dL   RDW 13.5 11.5 - 15.5 %   Platelets 412 (H) 150 - 400 K/uL   Neutrophils Relative % 70 %   Neutro Abs 11.8 (H) 1.7 - 7.7 K/uL   Lymphocytes Relative 18 %   Lymphs Abs 3.1 0.7 - 4.0 K/uL   Monocytes Relative 9 %   Monocytes Absolute 1.5 (H) 0.1 -  1.0 K/uL   Eosinophils Relative 3 %   Eosinophils Absolute 0.4 0.0 - 0.7  K/uL   Basophils Relative 0 %   Basophils Absolute 0.0 0.0 - 0.1 K/uL  Comprehensive metabolic panel     Status: Abnormal   Collection Time: 06/24/15  6:39 PM  Result Value Ref Range   Sodium 141 135 - 145 mmol/L   Potassium 3.7 3.5 - 5.1 mmol/L   Chloride 103 101 - 111 mmol/L   CO2 27 22 - 32 mmol/L   Glucose, Bld 92 65 - 99 mg/dL   BUN 17 6 - 20 mg/dL   Creatinine, Ser 1.14 (H) 0.44 - 1.00 mg/dL   Calcium 9.6 8.9 - 10.3 mg/dL   Total Protein 8.1 6.5 - 8.1 g/dL   Albumin 4.0 3.5 - 5.0 g/dL   AST 19 15 - 41 U/L   ALT 18 14 - 54 U/L   Alkaline Phosphatase 75 38 - 126 U/L   Total Bilirubin 0.7 0.3 - 1.2 mg/dL   GFR calc non Af Amer 51 (L) >60 mL/min   GFR calc Af Amer 59 (L) >60 mL/min    Comment: (NOTE) The eGFR has been calculated using the CKD EPI equation. This calculation has not been validated in all clinical situations. eGFR's persistently <60 mL/min signify possible Chronic Kidney Disease.    Anion gap 11 5 - 15  I-Stat CG4 Lactic Acid, ED     Status: None   Collection Time: 06/24/15  6:48 PM  Result Value Ref Range   Lactic Acid, Venous 0.84 0.5 - 2.0 mmol/L  Urinalysis, Routine w reflex microscopic (not at [facility])     Status: Abnormal   Collection Time: 06/25/15 12:20 AM  Result Value Ref Range   Color, Urine YELLOW YELLOW   APPearance CLOUDY (A) CLEAR   Specific Gravity, Urine 1.030 1.005 - 1.030   pH 6.5 5.0 - 8.0   Glucose, UA NEGATIVE NEGATIVE mg/dL   Hgb urine dipstick TRACE (A) NEGATIVE   Bilirubin Urine NEGATIVE NEGATIVE   Ketones, ur NEGATIVE NEGATIVE mg/dL   Protein, ur NEGATIVE NEGATIVE mg/dL   Nitrite POSITIVE (A) NEGATIVE   Leukocytes, UA SMALL (A) NEGATIVE  Urine microscopic-add on     Status: Abnormal   Collection Time: 06/25/15 12:20 AM  Result Value Ref Range   Squamous Epithelial / LPF NONE SEEN NONE SEEN   WBC, UA 6-30 0 - 5 WBC/hpf   RBC / HPF 0-5 0 - 5 RBC/hpf    Bacteria, UA FEW (A) NONE SEEN  Magnesium     Status: Abnormal   Collection Time: 06/25/15  5:06 AM  Result Value Ref Range   Magnesium 1.4 (L) 1.7 - 2.4 mg/dL  Phosphorus     Status: None   Collection Time: 06/25/15  5:06 AM  Result Value Ref Range   Phosphorus 3.4 2.5 - 4.6 mg/dL  TSH     Status: None   Collection Time: 06/25/15  5:06 AM  Result Value Ref Range   TSH 1.600 0.350 - 4.500 uIU/mL  Comprehensive metabolic panel     Status: Abnormal   Collection Time: 06/25/15  5:06 AM  Result Value Ref Range   Sodium 139 135 - 145 mmol/L   Potassium 3.3 (L) 3.5 - 5.1 mmol/L   Chloride 105 101 - 111 mmol/L   CO2 25 22 - 32 mmol/L   Glucose, Bld 91 65 - 99 mg/dL   BUN 11 6 - 20 mg/dL   Creatinine, Ser 0.84 0.44 - 1.00 mg/dL   Calcium 8.5 (L) 8.9 - 10.3 mg/dL  Total Protein 6.6 6.5 - 8.1 g/dL   Albumin 3.1 (L) 3.5 - 5.0 g/dL   AST 17 15 - 41 U/L   ALT 15 14 - 54 U/L   Alkaline Phosphatase 60 38 - 126 U/L   Total Bilirubin 0.2 (L) 0.3 - 1.2 mg/dL   GFR calc non Af Amer >60 >60 mL/min   GFR calc Af Amer >60 >60 mL/min    Comment: (NOTE) The eGFR has been calculated using the CKD EPI equation. This calculation has not been validated in all clinical situations. eGFR's persistently <60 mL/min signify possible Chronic Kidney Disease.    Anion gap 9 5 - 15  CBC     Status: Abnormal   Collection Time: 06/25/15  5:06 AM  Result Value Ref Range   WBC 12.1 (H) 4.0 - 10.5 K/uL   RBC 3.61 (L) 3.87 - 5.11 MIL/uL   Hemoglobin 11.4 (L) 12.0 - 15.0 g/dL   HCT 34.6 (L) 36.0 - 46.0 %   MCV 95.8 78.0 - 100.0 fL   MCH 31.6 26.0 - 34.0 pg   MCHC 32.9 30.0 - 36.0 g/dL   RDW 13.6 11.5 - 15.5 %   Platelets 381 150 - 400 K/uL    Ct Pelvis W Contrast  06/24/2015  CLINICAL DATA:  Weeping portal at the left lower buttocks, near the rectum. Pain and swelling. Initial encounter. EXAM: CT PELVIS WITH CONTRAST TECHNIQUE: Multidetector CT imaging of the pelvis was performed using the standard  protocol following the bolus administration of intravenous contrast. CONTRAST:  153m OMNIPAQUE IOHEXOL 300 MG/ML  SOLN COMPARISON:  None. FINDINGS: Focal soft tissue inflammation is noted along the left buttock, tracking along the proximal left thigh. There is suggestion of an associated small area of fluid measuring 2.3 x 1.4 cm, though no well defined abscess is seen. This may simply reflect more focal soft tissue edema. The patient's urostomy is grossly unremarkable in appearance. Visualized small and large bowel loops are grossly unremarkable, aside from diverticulosis along the sigmoid colon. No acute osseous abnormalities are identified. Scattered vascular calcifications are seen. IMPRESSION: 1. Focal soft tissue inflammation along the left buttock, tracking along the proximal left thigh. Suggestion of associated small area of fluid measuring 2.3 x 1.4 cm, though no well defined abscess is seen. This may simply reflect more focal soft tissue edema. 2. Diverticulosis along the sigmoid colon. 3. Scattered vascular calcifications seen. Electronically Signed   By: JGarald BaldingM.D.   On: 06/24/2015 21:30    Review of Systems  Constitutional: Negative for fever and chills.  Respiratory: Negative.   Cardiovascular: Negative.   Gastrointestinal: Negative for nausea, vomiting and abdominal pain.   Blood pressure 128/67, pulse 86, temperature 98.2 F (36.8 C), temperature source Oral, resp. rate 18, height '5\' 4"'$  (1.626 m), weight 94.666 kg (208 lb 11.2 oz), SpO2 96 %. Physical Exam  General: Alert, obese AA female, in no distress Skin: Warm and dry without rash or infection.  See extremities HEENT: No palpable masses or thyromegaly. Sclera nonicteric. Lungs: Breath sounds clear and equal without increased work of breathing Cardiovascular: Regular rate and rhythm without murmur. No JVD or edema.  Abdomen: Obese. Long healed midline incision.  Urostomy in the right lower quadrant.Nondistended. Very  mild low midline tenderness.. No masses palpable. . No palpable hernias.  Extremities: No edema or joint swelling or deformity. No chronic venous stasis changes. Overlying the left buttock inferiorly  But not in the perirectal space is a large, approximately  5 cm raised tender furuncle with multiple draining sinus tracts. No evidence of necrosis. Mild edema  Surrounding and extending into the posterior proximal thigh. Neurologic: Alert and fully oriented. Gait normal.  Assessment/Plan: Complex abscess/furuncle left buttock. This will require debridement and drainage in the operating room. I discussed this with the patient including the nature of the problem  And surgery and the indications. She is in agreement.  Agree with current antibiotic management.  Fatih Stalvey T 06/25/2015, 7:47 AM

## 2015-06-25 NOTE — Anesthesia Preprocedure Evaluation (Addendum)
Anesthesia Evaluation  Patient identified by MRN, date of birth, ID band Patient awake    Reviewed: Allergy & Precautions, NPO status , Patient's Chart, lab work & pertinent test results, reviewed documented beta blocker date and time   Airway Mallampati: III  TM Distance: >3 FB Neck ROM: Full    Dental  (+) Teeth Intact   Pulmonary asthma , former smoker,    breath sounds clear to auscultation       Cardiovascular hypertension, Pt. on medications and Pt. on home beta blockers + CAD   Rhythm:Regular Rate:Normal     Neuro/Psych  Neuromuscular disease negative psych ROS   GI/Hepatic GERD  Medicated,(+) Hepatitis -, C  Endo/Other  diabetes, Type 2, Oral Hypoglycemic Agents  Renal/GU CRFRenal disease  negative genitourinary   Musculoskeletal   Abdominal   Peds negative pediatric ROS (+)  Hematology   Anesthesia Other Findings - IBS - Interstitial Cystitis - B12 Def.  Reproductive/Obstetrics                            Lab Results  Component Value Date   WBC 12.1* 06/25/2015   HGB 11.4* 06/25/2015   HCT 34.6* 06/25/2015   MCV 95.8 06/25/2015   PLT 381 06/25/2015   Lab Results  Component Value Date   CREATININE 0.84 06/25/2015   BUN 11 06/25/2015   NA 139 06/25/2015   K 3.3* 06/25/2015   CL 105 06/25/2015   CO2 25 06/25/2015   No results found for: INR, PROTIME   Anesthesia Physical Anesthesia Plan  ASA: III  Anesthesia Plan: General   Post-op Pain Management:    Induction: Intravenous, Rapid sequence and Cricoid pressure planned  Airway Management Planned: Oral ETT  Additional Equipment:   Intra-op Plan:   Post-operative Plan: Extubation in OR  Informed Consent: I have reviewed the patients History and Physical, chart, labs and discussed the procedure including the risks, benefits and alternatives for the proposed anesthesia with the patient or authorized  representative who has indicated his/her understanding and acceptance.   Dental advisory given  Plan Discussed with: CRNA  Anesthesia Plan Comments:         Anesthesia Quick Evaluation

## 2015-06-25 NOTE — Progress Notes (Signed)
Pharmacy Antibiotic Note  Laurie Arlana HoveVincent [name] is a 60 y.o. female admitted on 06/24/2015 with Cellulitis, possible perianal abscess and UTI.  Pharmacy has been consulted for Vancomycin and Zosyn  dosing.  Plan: Vancomycin 1500mg  IV every 24 hours.  Goal trough 10-15 mcg/mL. Zosyn 3.375g IV q8h (4 hour infusion).  Height: 5\' 4"  (162.6 cm) Weight: 208 lb 11.2 oz (94.666 kg) IBW/kg (Calculated) : 54.7  Temp (24hrs), Avg:98.2 F (36.8 C), Min:98.1 F (36.7 C), Max:98.4 F (36.9 C)   Recent Labs Lab 06/24/15 1839 06/24/15 1848  WBC 16.8*  --   CREATININE 1.14*  --   LATICACIDVEN  --  0.84    Estimated Creatinine Clearance: 58.6 mL/min (by C-G formula based on Cr of 1.14).    Allergies  Allergen Reactions  . Celebrex [Celecoxib] Itching  . Ciprofloxacin Other (See Comments)    No history of rash or SOB. Had kidney problems when she took cipro but was not told she had interstitial nephritis.  Marland Kitchen. Cymbalta [Duloxetine Hcl]     GI upset / constipation.   . Dilaudid [Hydromorphone Hcl] Itching  . Diuretic [Buchu-Cornsilk-Ch Grass-Hydran] Other (See Comments)    Dehydration. Elevated creatini  . Fentanyl And Related Itching  . Glucophage [Metformin Hcl] Other (See Comments)    Stopped due to increase creat  . Lyrica [Pregabalin] Other (See Comments)    CNS disorder. Headache  . Oxycodone Itching  . Morphine And Related Rash  . Sulfa Antibiotics Rash  . Vicodin [Hydrocodone-Acetaminophen] Rash    Antimicrobials this admission: Vancomycin 3/18 >>  Zosyn 3/18  >>    Microbiology results: Pending  Thank you for allowing pharmacy to be a part of this patient's care.  Aleene DavidsonGrimsley Jr, Lindell Renfrew Crowford 06/25/2015 12:31 AM

## 2015-06-25 NOTE — Plan of Care (Signed)
Problem: Skin Integrity: Goal: Skin integrity will improve Outcome: Progressing Will encourage walking post procedure

## 2015-06-26 ENCOUNTER — Inpatient Hospital Stay (HOSPITAL_COMMUNITY): Payer: Medicare Other

## 2015-06-26 LAB — GLUCOSE, CAPILLARY
GLUCOSE-CAPILLARY: 307 mg/dL — AB (ref 65–99)
Glucose-Capillary: 182 mg/dL — ABNORMAL HIGH (ref 65–99)
Glucose-Capillary: 175 mg/dL — ABNORMAL HIGH (ref 65–99)
Glucose-Capillary: 191 mg/dL — ABNORMAL HIGH (ref 65–99)

## 2015-06-26 LAB — BASIC METABOLIC PANEL
ANION GAP: 7 (ref 5–15)
BUN: 20 mg/dL (ref 6–20)
CHLORIDE: 106 mmol/L (ref 101–111)
CO2: 24 mmol/L (ref 22–32)
Calcium: 8.8 mg/dL — ABNORMAL LOW (ref 8.9–10.3)
Creatinine, Ser: 1.61 mg/dL — ABNORMAL HIGH (ref 0.44–1.00)
GFR calc non Af Amer: 34 mL/min — ABNORMAL LOW (ref >60)
GFR, EST AFRICAN AMERICAN: 39 mL/min — AB (ref >60)
Glucose, Bld: 174 mg/dL — ABNORMAL HIGH (ref 65–99)
Potassium: 4.1 mmol/L (ref 3.5–5.1)
Sodium: 137 mmol/L (ref 135–145)

## 2015-06-26 LAB — CBC
HEMATOCRIT: 33.8 % — AB (ref 36.0–46.0)
HEMOGLOBIN: 11.1 g/dL — AB (ref 12.0–15.0)
MCH: 30.3 pg (ref 26.0–34.0)
MCHC: 32.8 g/dL (ref 30.0–36.0)
MCV: 92.3 fL (ref 78.0–100.0)
Platelets: 421 10*3/uL — ABNORMAL HIGH (ref 150–400)
RBC: 3.66 MIL/uL — ABNORMAL LOW (ref 3.87–5.11)
RDW: 13.2 % (ref 11.5–15.5)
WBC: 14.4 10*3/uL — AB (ref 4.0–10.5)

## 2015-06-26 IMAGING — DX DG ABD PORTABLE 1V
1 series · 2 of 2 positions shown · non-contrast
Comparison: None.

CLINICAL DATA: Abdominal cramping for 1 week

EXAM:
PORTABLE ABDOMEN - 1 VIEW

[Series 2: abdomen kub · 0.14mm/px · 2 of 2 slices shown]
[im 1/2]
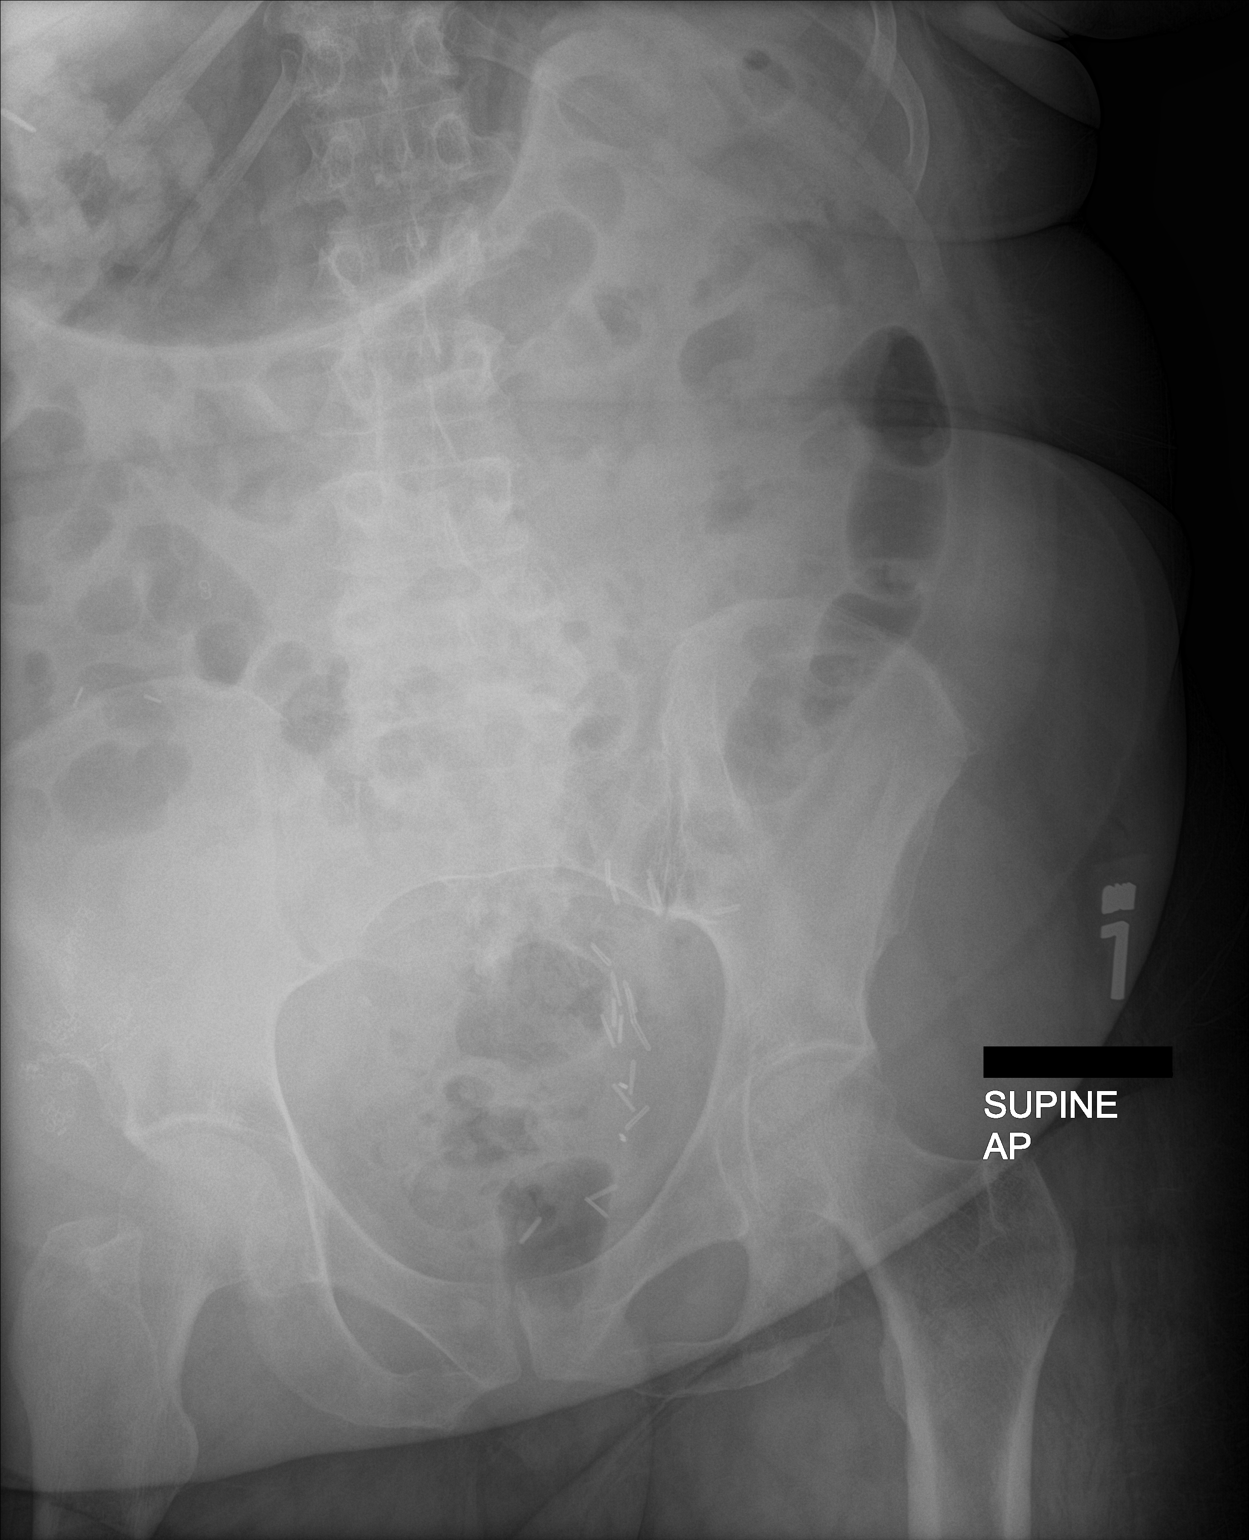
[im 2/2]
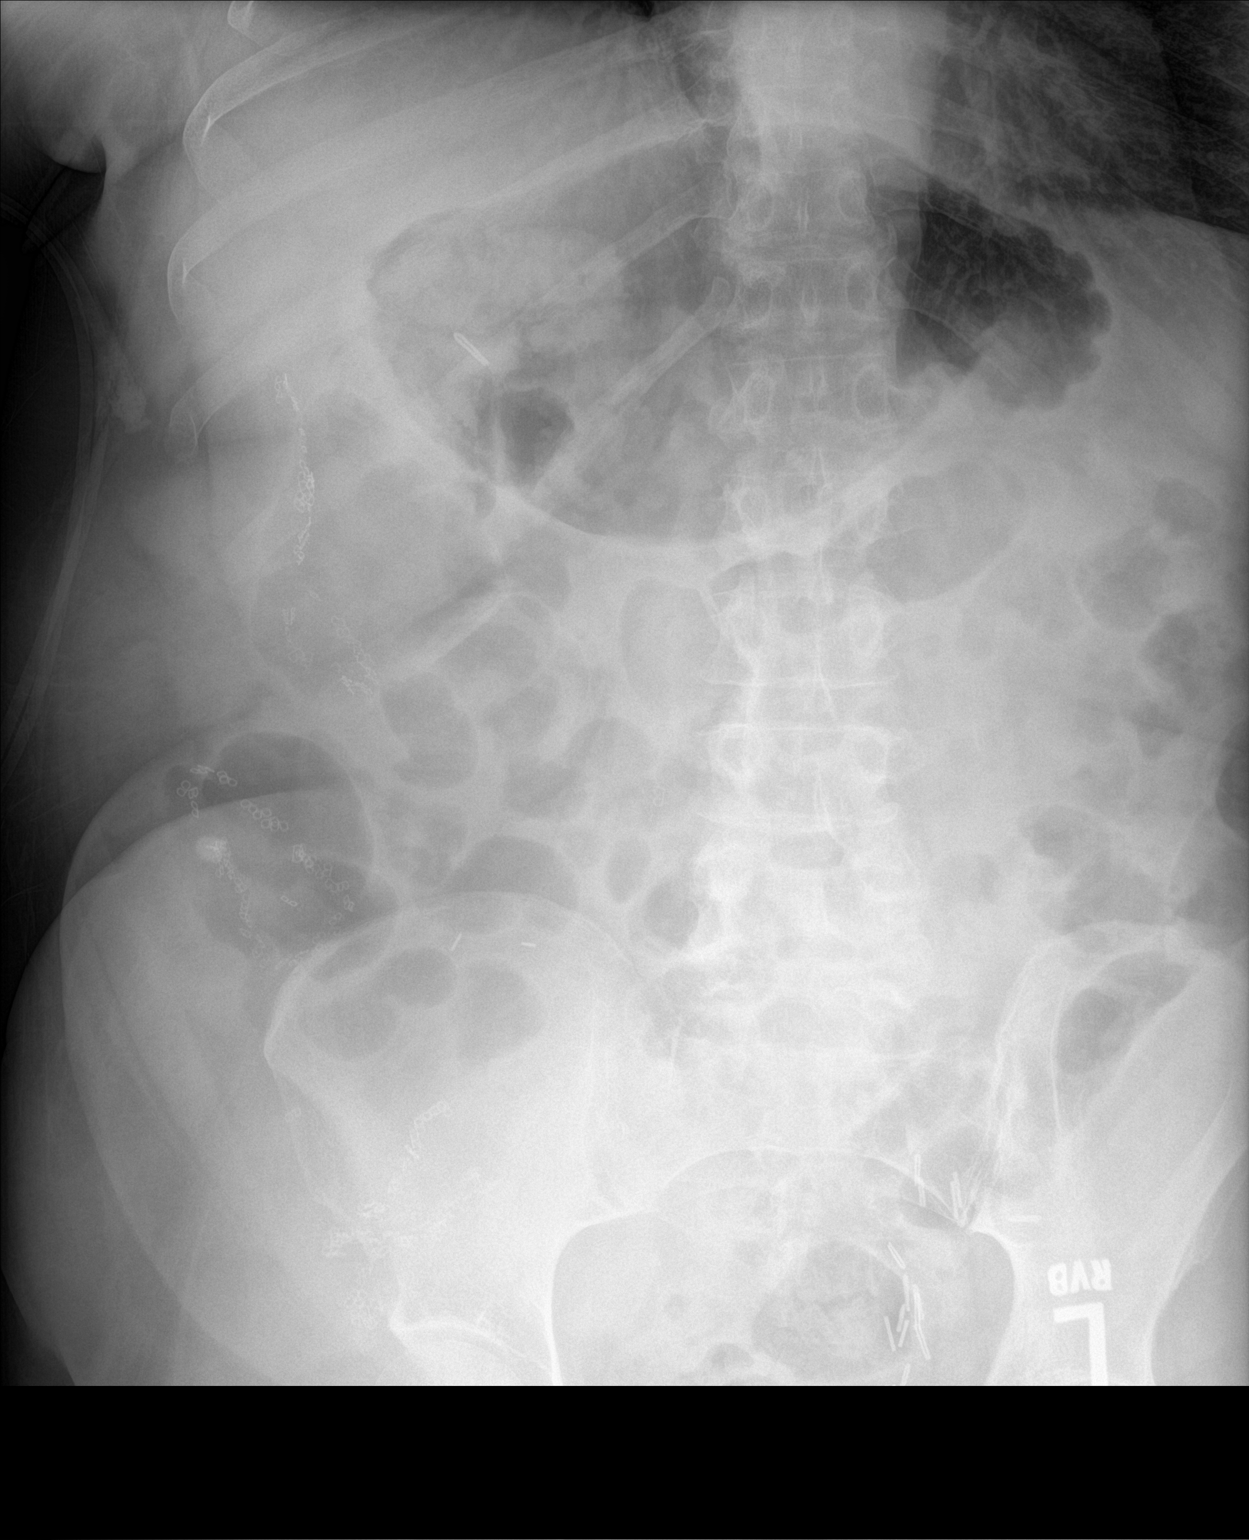

[2 of 2 positions shown; findings below may reference images not displayed]

FINDINGS: Scattered large and small bowel gas is noted. Postsurgical changes
are seen. No abnormal mass or abnormal calcifications are noted. No
free air is seen. No acute bony abnormality is noted.
IMPRESSION: No acute abnormality noted.

## 2015-06-26 MED ORDER — SODIUM CHLORIDE 0.9 % IV SOLN
INTRAVENOUS | Status: DC
Start: 2015-06-26 — End: 2015-07-01
  Administered 2015-06-26 – 2015-06-30 (×3): via INTRAVENOUS
  Administered 2015-07-01: 1000 mL via INTRAVENOUS

## 2015-06-26 MED ORDER — SODIUM CHLORIDE 0.9 % IV SOLN
Freq: Once | INTRAVENOUS | Status: AC
  Administered 2015-06-26: 09:00:00 via INTRAVENOUS

## 2015-06-26 MED ORDER — INSULIN GLARGINE 100 UNIT/ML ~~LOC~~ SOLN
15.0000 [IU] | Freq: Two times a day (BID) | SUBCUTANEOUS | Status: DC
  Filled 2015-06-26 (×2): qty 0.15

## 2015-06-26 MED ORDER — INSULIN GLARGINE 100 UNIT/ML ~~LOC~~ SOLN
15.0000 [IU] | Freq: Two times a day (BID) | SUBCUTANEOUS | Status: DC
  Administered 2015-06-26 (×2): 15 [IU] via SUBCUTANEOUS
  Filled 2015-06-26 (×3): qty 0.15

## 2015-06-26 MED ORDER — INSULIN ASPART 100 UNIT/ML ~~LOC~~ SOLN
0.0000 [IU] | Freq: Three times a day (TID) | SUBCUTANEOUS | Status: DC
  Administered 2015-06-26 (×2): 4 [IU] via SUBCUTANEOUS
  Administered 2015-06-27: 3 [IU] via SUBCUTANEOUS
  Administered 2015-06-27: 4 [IU] via SUBCUTANEOUS
  Administered 2015-06-27 – 2015-06-28 (×2): 7 [IU] via SUBCUTANEOUS

## 2015-06-26 NOTE — Progress Notes (Signed)
Patient Demographics  Laurie [name], is a 60 y.o. female, DOB - Jul 01, 1955, VWU:[id number]  Admit date - 06/24/2015   Admitting Physician Therisa Doyne, MD  Outpatient Primary MD for the patient is No primary care provider on file.  LOS - 2   Chief Complaint  Patient presents with  . Abscess       Admission HPI/Brief narrative: 60 year old female with history of diabetes mellitus, coronary artery disease, hypertension presents with left buttock abscess and cellulitis, seen by surgery, status post I&D on 3/18.  Subjective:   Laurie [name] today has, No headache, No chest pain, No abdominal pain - No Nausea, complanes of mild abdominal pain.  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 - Wound culture Gram stain showing  gram-positive cocci in pairs  Diabetes mellitus - CBG uncontrolled, will increase insulin sliding scale to  resistant scale, and will increase Lantus  Acute renal failure - Patient with significant bump in creatinine today, will discontinue Toradol, lisinopril, will start on IV fluids, recheck in a.m.  UTI - Continue with IV antibiotics,  - Urine culture growing Escherichia coli  Coronary artery disease - Denies any chest pain or shortness of breath, continue with home medication including aspirin, statin and beta blockers  Hypertension - Continue with home medication  Asthma - Appears to be stable, continue with  home medication  Candidal esophagitis - Continue with nystatin swish and swallow    Code Status: Full  Family Communication: Discussed with patient  Disposition Plan: Home once stable   Procedures  Incision and drainage of left buttock abscess   Consults   Gen. surgery   Medications  Scheduled Meds: . amLODipine  10 mg Oral Daily  . aspirin EC  81 mg Oral Daily  . Chlorhexidine Gluconate Cloth  6 each Topical Q0600  . enoxaparin (LOVENOX) injection  40 mg Subcutaneous QHS  . FLUoxetine  40 mg Oral Daily  . gabapentin  600 mg Oral BID  . insulin aspart  0-20 Units Subcutaneous TID WC  . insulin aspart  0-5 Units Subcutaneous QHS  . insulin glargine  15 Units Subcutaneous BID  . isosorbide mononitrate  60 mg Oral Daily  . metoprolol succinate  100 mg Oral Daily  . mometasone-formoterol  2 puff Inhalation BID  . mupirocin ointment  1 application Nasal BID  . nystatin  5 mL Oral QID  . pantoprazole  40 mg Oral Daily  . piperacillin-tazobactam (ZOSYN)  IV  3.375 g Intravenous 3 times per day  . pravastatin  20 mg Oral QHS  . traZODone  50 mg Oral QHS  . vancomycin  1,500 mg Intravenous Q24H   Continuous Infusions: . sodium chloride     PRN Meds:.acetaminophen **OR** acetaminophen, albuterol, diphenhydrAMINE, HYDROmorphone (DILAUDID) injection, ondansetron **OR** ondansetron (  ZOFRAN) IV, temazepam  DVT Prophylaxis  Lovenox   Lab Results  Component Value Date   PLT 421* 06/26/2015    Antibiotics    Anti-infectives    Start     Dose/Rate Route Frequency Ordered Stop   06/25/15 2200  vancomycin (VANCOCIN) 1,500 mg in sodium chloride 0.9 % 500 mL IVPB     1,500 mg 250 mL/hr over 120 Minutes Intravenous Every 24 hours 06/25/15 0026     06/25/15 0030  vancomycin (VANCOCIN) 1,500 mg in sodium chloride 0.9 % 500 mL IVPB     1,500 mg 250 mL/hr over 120 Minutes Intravenous  Once 06/25/15 0025 06/25/15 0258   06/24/15 2345  piperacillin-tazobactam (ZOSYN) IVPB 3.375  g     3.375 g 12.5 mL/hr over 240 Minutes Intravenous 3 times per day 06/24/15 2332     06/24/15 2200  ceFAZolin (ANCEF) IVPB 1 g/50 mL premix     1 g 100 mL/hr over 30 Minutes Intravenous  Once 06/24/15 2151 06/24/15 2250          Objective:   Filed Vitals:   06/26/15 0521 06/26/15 1000 06/26/15 1047 06/26/15 1443  BP: 128/69 106/45 110/64 109/60  Pulse: 75 89  78  Temp: 97.7 F (36.5 C) 97.9 F (36.6 C)  97.7 F (36.5 C)  TempSrc: Oral Oral  Oral  Resp: Height:      Weight:      SpO2: 98% 96%  99%    Wt Readings from Last 3 Encounters:  06/25/15 94.666 kg (208 lb 11.2 oz)     Intake/Output Summary (Last 24 hours) at 06/26/15 1523 Last data filed at 06/26/15 1443  Gross per 24 hour  Intake   1250 ml  Output   1300 ml  Net    -50 ml     Physical Exam  Awake Alert, Oriented X 3, No new F.N deficits, Normal affect [place].AT,PERRAL Supple Neck,No JVD, No cervical lymphadenopathy appriciated.  Symmetrical Chest wall movement, Good air movement bilaterally, CTAB RRR,No Gallops,Rubs or new Murmurs, No Parasternal Heave +ve B.Sounds, Abd Soft, No tenderness, No organomegaly appriciated, No rebound - guarding or rigidity. No Cyanosis, Clubbing or edema, No new Rash or bruise  ,  Data Review   Micro Results Recent Results (from the past 240 hour(s))  Urine culture     Status: None (Preliminary result)   Collection Time: 06/25/15 12:20 AM  Result Value Ref Range Status   Specimen Description URINE, CLEAN CATCH  Final   Special Requests NONE  Final   Culture   Final    >=100,000 COLONIES/mL ESCHERICHIA COLI Performed at [facility]    Report Status PENDING  Incomplete  Surgical pcr screen     Status: Abnormal   Collection Time: 06/25/15  9:10 AM  Result Value Ref Range Status   MRSA, PCR POSITIVE (A) NEGATIVE Final    Comment: RESULT CALLED TO, READ BACK BY AND VERIFIED WITH: P RAMIREZ AT 1100 ON 03.18.2017 BY NBROOKS    Staphylococcus  aureus POSITIVE (A) NEGATIVE Final    Comment:        The Xpert SA Assay (FDA approved for NASAL specimens in patients over 24 years of age), is one component of a comprehensive surveillance program.  Test performance has been validated by [facility] for patients greater than or equal to 7 year old. It is not intended to diagnose infection nor to guide or monitor treatment.   Anaerobic culture  Status: None (Preliminary result)   Collection Time: 06/25/15 11:25 AM  Result Value Ref Range Status   Specimen Description BUTTOCKS LEFT TISSUE  Final   Special Requests NONE  Final   Gram Stain   Final    ABUNDANT WBC PRESENT, PREDOMINANTLY PMN ABUNDANT GRAM POSITIVE COCCI IN PAIRS IN CLUSTERS Performed at [facility]    Culture PENDING  Incomplete   Report Status PENDING  Incomplete  Tissue culture     Status: None (Preliminary result)   Collection Time: 06/25/15 11:25 AM  Result Value Ref Range Status   Specimen Description BUTTOCKS LEFT TISSUE  Final   Special Requests NONE  Final   Gram Stain   Final    ABUNDANT WBC PRESENT, PREDOMINANTLY PMN ABUNDANT GRAM POSITIVE COCCI IN PAIRS IN CLUSTERS Performed at [facility]    Culture PENDING  Incomplete   Report Status PENDING  Incomplete    Radiology Reports Ct Pelvis W Contrast  06/24/2015  CLINICAL DATA:  Weeping portal at the left lower buttocks, near the rectum. Pain and swelling. Initial encounter. EXAM: CT PELVIS WITH CONTRAST TECHNIQUE: Multidetector CT imaging of the pelvis was performed using the standard protocol following the bolus administration of intravenous contrast. CONTRAST:  100mL OMNIPAQUE IOHEXOL 300 MG/ML  SOLN COMPARISON:  None. FINDINGS: Focal soft tissue inflammation is noted along the left buttock, tracking along the proximal left thigh. There is suggestion of an associated small area of fluid measuring 2.3 x 1.4 cm, though no well defined abscess is seen. This may simply reflect more  focal soft tissue edema. The patient's urostomy is grossly unremarkable in appearance. Visualized small and large bowel loops are grossly unremarkable, aside from diverticulosis along the sigmoid colon. No acute osseous abnormalities are identified. Scattered vascular calcifications are seen. IMPRESSION: 1. Focal soft tissue inflammation along the left buttock, tracking along the proximal left thigh. Suggestion of associated small area of fluid measuring 2.3 x 1.4 cm, though no well defined abscess is seen. This may simply reflect more focal soft tissue edema. 2. Diverticulosis along the sigmoid colon. 3. Scattered vascular calcifications seen. Electronically Signed   By: Roanna RaiderJeffery  Chang M.D.   On: 06/24/2015 21:30     CBC  Recent Labs Lab 06/24/15 1839 06/25/15 0506 06/26/15 0535  WBC 16.8* 12.1* 14.4*  HGB 12.4 11.4* 11.1*  HCT 35.5* 34.6* 33.8*  PLT 412* 381 421*  MCV 91.5 95.8 92.3  MCH 32.0 31.6 30.3  MCHC 34.9 32.9 32.8  RDW 13.5 13.6 13.2  LYMPHSABS 3.1  --   --   MONOABS 1.5*  --   --   EOSABS 0.4  --   --   BASOSABS 0.0  --   --     Chemistries   Recent Labs Lab 06/24/15 1839 06/25/15 0506 06/26/15 0535  NA 141 139 137  K 3.7 3.3* 4.1  CL 103 105 106  CO2 27 25 24   GLUCOSE 92 91 174*  BUN 17 11 20   CREATININE 1.14* 0.84 1.61*  CALCIUM 9.6 8.5* 8.8*  MG  --  1.4*  --   AST 19 17  --   ALT 18 15  --   ALKPHOS 75 60  --   BILITOT 0.7 0.2*  --    ------------------------------------------------------------------------------------------------------------------ estimated creatinine clearance is 41.5 mL/min (by C-G formula based on Cr of 1.61). ------------------------------------------------------------------------------------------------------------------ No results for input(s): HGBA1C in the last 72 hours. ------------------------------------------------------------------------------------------------------------------ No results for input(s): CHOL, HDL,  LDLCALC, TRIG, CHOLHDL, LDLDIRECT  in the last 72 hours. ------------------------------------------------------------------------------------------------------------------  Recent Labs  06/25/15 0506  TSH 1.600   ------------------------------------------------------------------------------------------------------------------ No results for input(s): VITAMINB12, FOLATE, FERRITIN, TIBC, IRON, RETICCTPCT in the last 72 hours.  Coagulation profile No results for input(s): INR, PROTIME in the last 168 hours.  No results for input(s): DDIMER in the last 72 hours.  Cardiac Enzymes No results for input(s): CKMB, TROPONINI, MYOGLOBIN in the last 168 hours.  Invalid input(s): CK ------------------------------------------------------------------------------------------------------------------ Invalid input(s): POCBNP     Time Spent in minutes   30 minutes   ELGERGAWY, DAWOOD M.D on 06/26/2015 at 3:23 PM  Between 7am to 7pm - Pager - [phone]  After 7pm go to [email or web address] - password [facility]  [facility]   Office  [phone]

## 2015-06-26 NOTE — Progress Notes (Signed)
Utilization Review Completed.Laurie [name] T3/19/2017  

## 2015-06-26 NOTE — Progress Notes (Signed)
Patient ID: [name] RecordsLavita Vincent [name], female   DOB: 05-26-1955, 60 y.o.   MRN: [id number] 1 Day Post-Op  Subjective: Feels "much better" today. Just sore.  Objective: Vital signs in last 24 hours: Temp:  [97.6 F (36.4 C)-98.4 F (36.9 C)] 97.7 F (36.5 C) (03/19 0521) Pulse Rate:  [75-94] 75 (03/19 0521) Resp:  [15-28] 16 (03/19 0521) BP: (112-141)/(54-88) 128/69 mmHg (03/19 0521) SpO2:  [91 %-99 %] 98 % (03/19 0521) Last BM Date: 06/24/15  Intake/Output from previous day: 03/18 0701 - 03/19 0700 In: 1930 [P.O.:480; I.V.:800; IV Piggyback:650] Out: 2510 [Urine:2500; Blood:10] Intake/Output this shift:    General appearance: alert, cooperative and no distress Incision/Wound: base of wound clean without drainage. Minimal surrounding erythema and induration.  Lab Results:   Recent Labs  06/25/15 0506 06/26/15 0535  WBC 12.1* 14.4*  HGB 11.4* 11.1*  HCT 34.6* 33.8*  PLT 381 421*   BMET  Recent Labs  06/25/15 0506 06/26/15 0535  NA 139 137  K 3.3* 4.1  CL 105 106  CO2 25 24  GLUCOSE 91 174*  BUN 11 20  CREATININE 0.84 1.61*  CALCIUM 8.5* 8.8*     Studies/Results: Ct Pelvis W Contrast  06/24/2015  CLINICAL DATA:  Weeping portal at the left lower buttocks, near the rectum. Pain and swelling. Initial encounter. EXAM: CT PELVIS WITH CONTRAST TECHNIQUE: Multidetector CT imaging of the pelvis was performed using the standard protocol following the bolus administration of intravenous contrast. CONTRAST:  100mL OMNIPAQUE IOHEXOL 300 MG/ML  SOLN COMPARISON:  None. FINDINGS: Focal soft tissue inflammation is noted along the left buttock, tracking along the proximal left thigh. There is suggestion of an associated small area of fluid measuring 2.3 x 1.4 cm, though no well defined abscess is seen. This may simply reflect more focal soft tissue edema. The patient's urostomy is grossly unremarkable in appearance. Visualized small and large bowel loops are grossly unremarkable, aside  from diverticulosis along the sigmoid colon. No acute osseous abnormalities are identified. Scattered vascular calcifications are seen. IMPRESSION: 1. Focal soft tissue inflammation along the left buttock, tracking along the proximal left thigh. Suggestion of associated small area of fluid measuring 2.3 x 1.4 cm, though no well defined abscess is seen. This may simply reflect more focal soft tissue edema. 2. Diverticulosis along the sigmoid colon. 3. Scattered vascular calcifications seen. Electronically Signed   By: Roanna RaiderJeffery  Chang M.D.   On: 06/24/2015 21:30    Anti-infectives: Anti-infectives    Start     Dose/Rate Route Frequency Ordered Stop   06/25/15 2200  vancomycin (VANCOCIN) 1,500 mg in sodium chloride 0.9 % 500 mL IVPB     1,500 mg 250 mL/hr over 120 Minutes Intravenous Every 24 hours 06/25/15 0026     06/25/15 0030  vancomycin (VANCOCIN) 1,500 mg in sodium chloride 0.9 % 500 mL IVPB     1,500 mg 250 mL/hr over 120 Minutes Intravenous  Once 06/25/15 0025 06/25/15 0258   06/24/15 2345  piperacillin-tazobactam (ZOSYN) IVPB 3.375 g     3.375 g 12.5 mL/hr over 240 Minutes Intravenous 3 times per day 06/24/15 2332     06/24/15 2200  ceFAZolin (ANCEF) IVPB 1 g/50 mL premix     1 g 100 mL/hr over 30 Minutes Intravenous  Once 06/24/15 2151 06/24/15 2250      Assessment/Plan: s/p Procedure(s): INCISION AND DRAINAGE ABSCESS Doing well. Continue antibiotics and wound care ordered. Check CBC a.m.   LOS: 2 days    Vira Chaplin T  06/26/2015  

## 2015-06-27 ENCOUNTER — Encounter (HOSPITAL_COMMUNITY): Payer: Self-pay | Admitting: General Surgery

## 2015-06-27 ENCOUNTER — Inpatient Hospital Stay (HOSPITAL_COMMUNITY): Payer: Medicare Other

## 2015-06-27 LAB — CBC
HCT: 34.1 % — ABNORMAL LOW (ref 36.0–46.0)
Hemoglobin: 10.9 g/dL — ABNORMAL LOW (ref 12.0–15.0)
MCH: 31.2 pg (ref 26.0–34.0)
MCHC: 32 g/dL (ref 30.0–36.0)
MCV: 97.7 fL (ref 78.0–100.0)
PLATELETS: 439 10*3/uL — AB (ref 150–400)
RBC: 3.49 MIL/uL — ABNORMAL LOW (ref 3.87–5.11)
RDW: 13.6 % (ref 11.5–15.5)
WBC: 16.4 10*3/uL — ABNORMAL HIGH (ref 4.0–10.5)

## 2015-06-27 LAB — URINE CULTURE: Culture: >=100000

## 2015-06-27 LAB — VANCOMYCIN, RANDOM: Vancomycin Rm: 19 ug/mL

## 2015-06-27 LAB — BASIC METABOLIC PANEL
Anion gap: 10 (ref 5–15)
BUN: 25 mg/dL — AB (ref 6–20)
CHLORIDE: 108 mmol/L (ref 101–111)
CO2: 24 mmol/L (ref 22–32)
CREATININE: 2.35 mg/dL — AB (ref 0.44–1.00)
Calcium: 8.7 mg/dL — ABNORMAL LOW (ref 8.9–10.3)
GFR calc Af Amer: 25 mL/min — ABNORMAL LOW (ref >60)
GFR, EST NON AFRICAN AMERICAN: 21 mL/min — AB (ref >60)
Glucose, Bld: 181 mg/dL — ABNORMAL HIGH (ref 65–99)
Potassium: 3.9 mmol/L (ref 3.5–5.1)
SODIUM: 142 mmol/L (ref 135–145)

## 2015-06-27 LAB — GLUCOSE, CAPILLARY
GLUCOSE-CAPILLARY: 188 mg/dL — AB (ref 65–99)
GLUCOSE-CAPILLARY: 145 mg/dL — AB (ref 65–99)
Glucose-Capillary: 207 mg/dL — ABNORMAL HIGH (ref 65–99)
Glucose-Capillary: 198 mg/dL — ABNORMAL HIGH (ref 65–99)
Glucose-Capillary: 255 mg/dL — ABNORMAL HIGH (ref 65–99)

## 2015-06-27 LAB — HEMOGLOBIN A1C
Hgb A1c MFr Bld: 6.2 % — ABNORMAL HIGH (ref 4.8–5.6)
MEAN PLASMA GLUCOSE: 131 mg/dL

## 2015-06-27 IMAGING — DX DG CHEST 2V
2 series · 2 of 2 positions shown · non-contrast
Comparison: [DATE]

CLINICAL DATA: Patient reports cough, medicated hypertension and
ex-smoker.

EXAM:
CHEST - 2 VIEW

[chest lat]
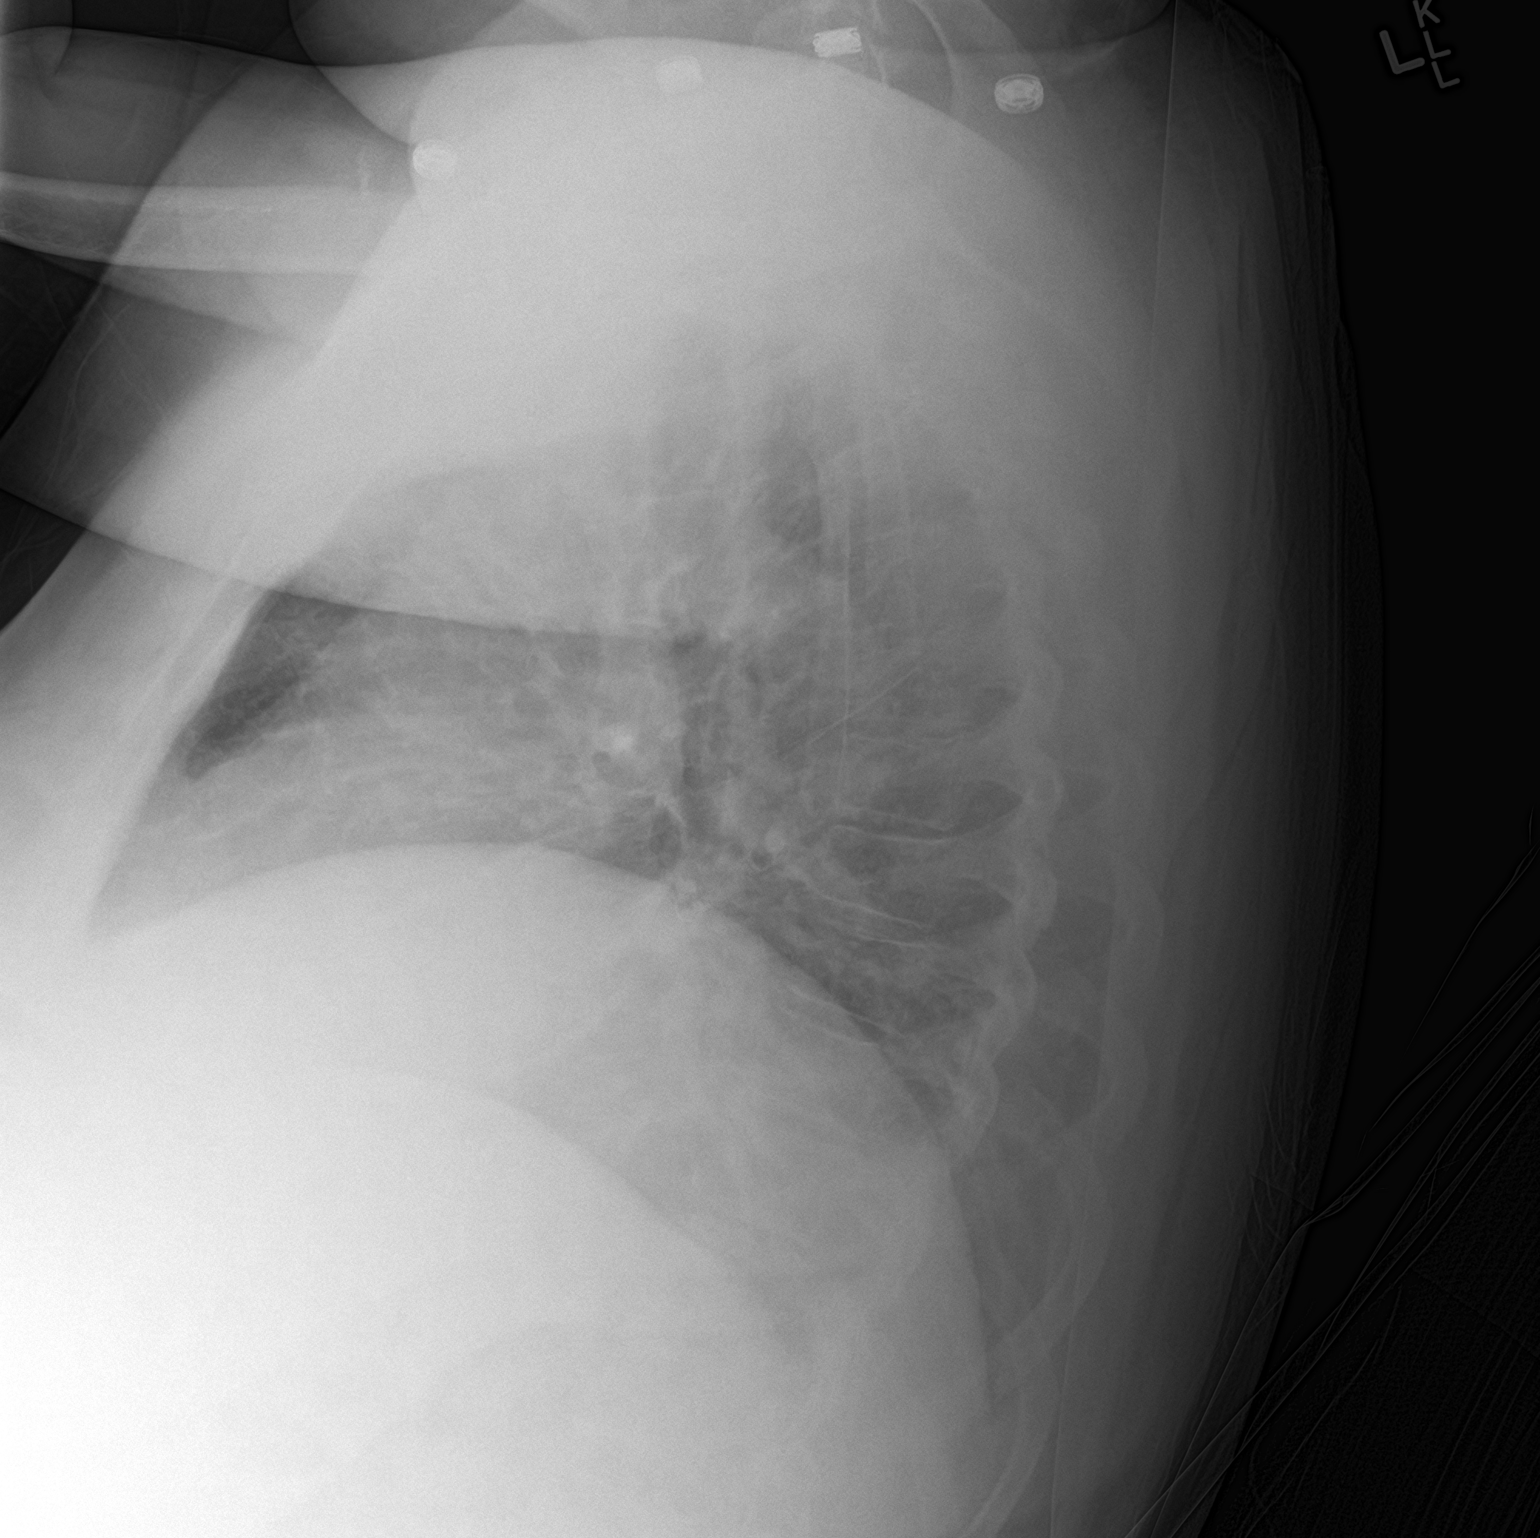

[chest ap]
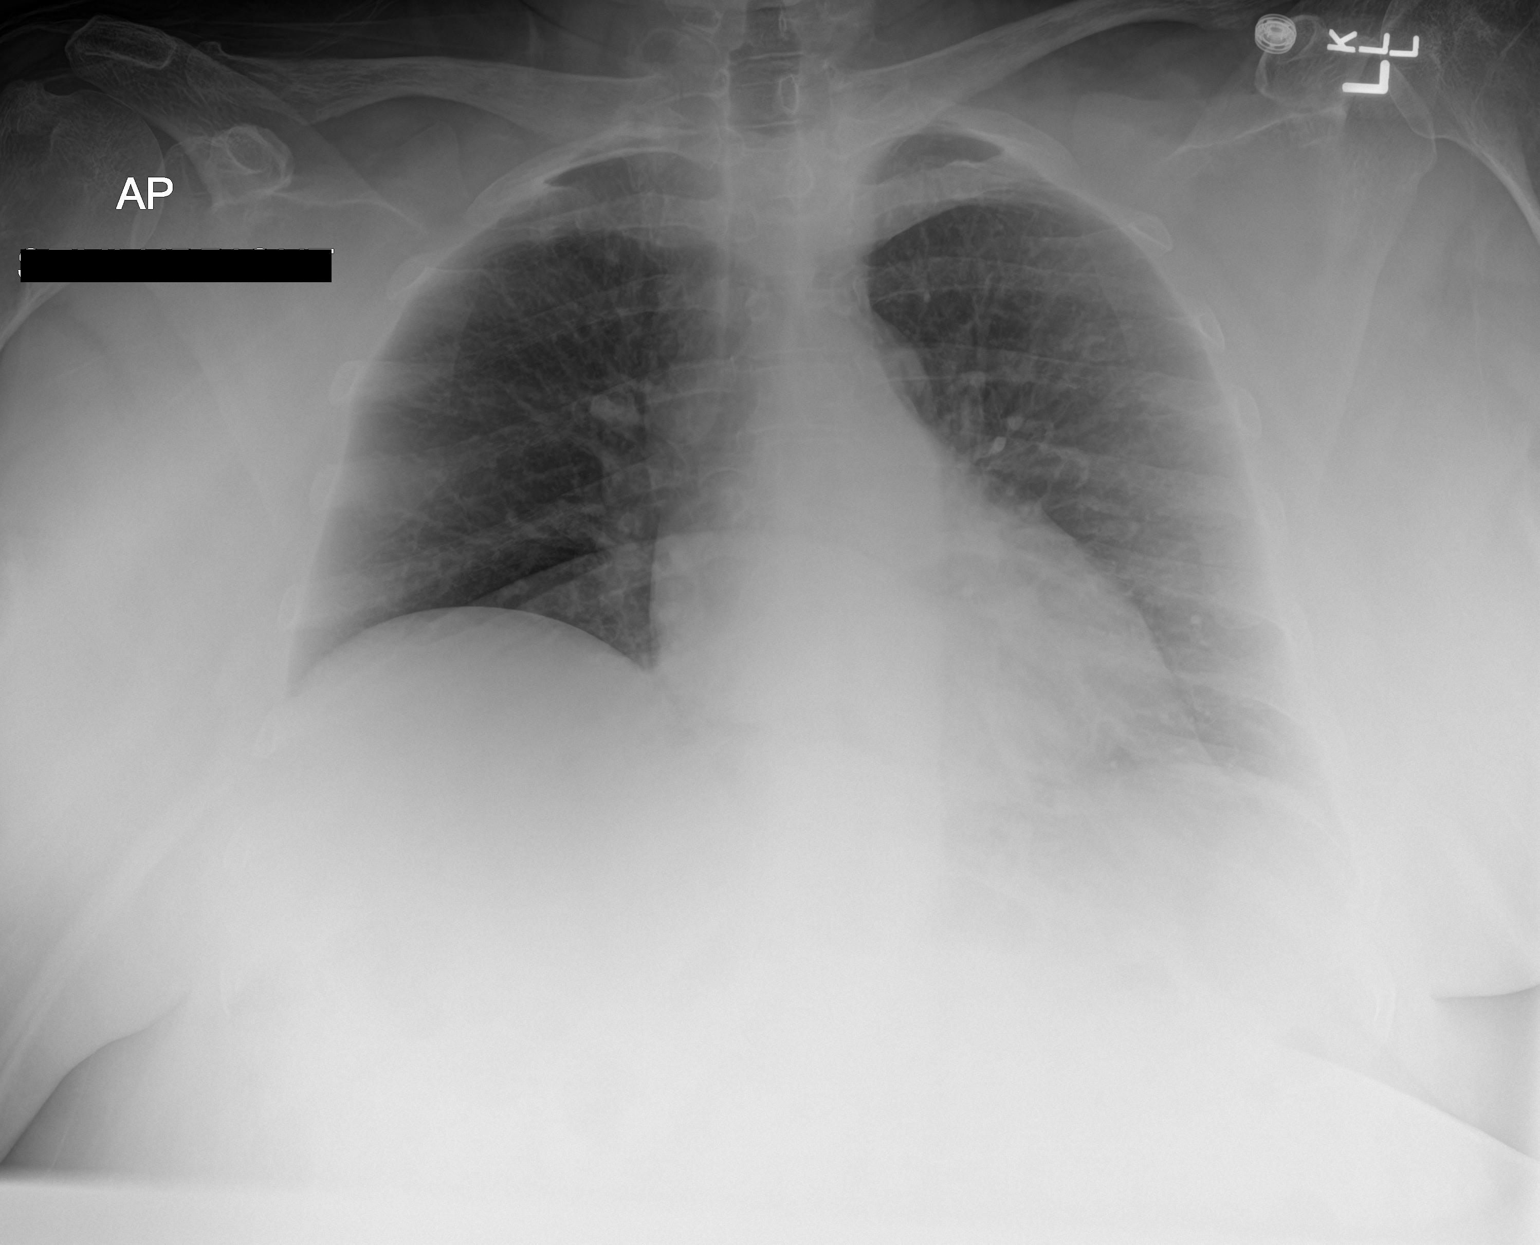

[2 of 2 positions shown; findings below may reference images not displayed]

FINDINGS: Lungs are clear. Heart size and mediastinal contours are within
normal limits.
No effusion.
Visualized skeletal structures are unremarkable.
IMPRESSION: No acute cardiopulmonary disease.

## 2015-06-27 MED ORDER — POLYETHYLENE GLYCOL 3350 17 G PO PACK
34.0000 g | PACK | Freq: Once | ORAL | Status: AC
  Administered 2015-06-27: 34 g via ORAL
  Filled 2015-06-27: qty 2

## 2015-06-27 MED ORDER — HYDROMORPHONE HCL 2 MG PO TABS
1.0000 mg | ORAL_TABLET | ORAL | Status: DC | PRN
  Administered 2015-06-27 – 2015-06-30 (×8): 1 mg via ORAL
  Filled 2015-06-27 (×8): qty 1

## 2015-06-27 MED ORDER — BISACODYL 5 MG PO TBEC
10.0000 mg | DELAYED_RELEASE_TABLET | Freq: Once | ORAL | Status: AC
  Administered 2015-06-27: 10 mg via ORAL
  Filled 2015-06-27: qty 2

## 2015-06-27 MED ORDER — MENTHOL 3 MG MT LOZG: 1.0000 | LOZENGE | OROMUCOSAL | Status: DC | PRN

## 2015-06-27 MED ORDER — BISACODYL 10 MG RE SUPP
10.0000 mg | Freq: Once | RECTAL | Status: AC
  Administered 2015-06-27: 10 mg via RECTAL
  Filled 2015-06-27: qty 1

## 2015-06-27 MED ORDER — ENOXAPARIN SODIUM 30 MG/0.3ML ~~LOC~~ SOLN
30.0000 mg | Freq: Every day | SUBCUTANEOUS | Status: DC
  Administered 2015-06-27 – 2015-06-28 (×2): 30 mg via SUBCUTANEOUS
  Filled 2015-06-27 (×3): qty 0.3

## 2015-06-27 MED ORDER — INSULIN GLARGINE 100 UNIT/ML ~~LOC~~ SOLN
17.0000 [IU] | Freq: Two times a day (BID) | SUBCUTANEOUS | Status: DC
  Administered 2015-06-27 – 2015-07-01 (×9): 17 [IU] via SUBCUTANEOUS
  Filled 2015-06-27 (×9): qty 0.17

## 2015-06-27 MED ORDER — VANCOMYCIN HCL IN DEXTROSE 1-5 GM/200ML-% IV SOLN
1000.0000 mg | INTRAVENOUS | Status: DC
  Administered 2015-06-27 – 2015-06-28 (×2): 1000 mg via INTRAVENOUS
  Filled 2015-06-27 (×2): qty 200

## 2015-06-27 MED ORDER — DIPHENHYDRAMINE HCL 12.5 MG/5ML PO ELIX
12.5000 mg | ORAL_SOLUTION | Freq: Four times a day (QID) | ORAL | Status: DC | PRN
  Administered 2015-06-27 (×2): 12.5 mg via ORAL
  Administered 2015-06-28 (×2): 25 mg via ORAL
  Administered 2015-06-29: 12.5 mg via ORAL
  Administered 2015-06-29 (×2): 25 mg via ORAL
  Administered 2015-06-30 – 2015-07-01 (×4): 12.5 mg via ORAL
  Filled 2015-06-27: qty 10
  Filled 2015-06-27 (×2): qty 5
  Filled 2015-06-27 (×2): qty 10
  Filled 2015-06-27 (×2): qty 5
  Filled 2015-06-27: qty 10
  Filled 2015-06-27 (×2): qty 5
  Filled 2015-06-27: qty 10

## 2015-06-27 NOTE — Progress Notes (Signed)
Dr Brendia SacksElgergaw notified patients IV infiltrated and we are unable to Restart it. Attempts by me 2 CRNAs  And 2 iV team members without success.Instructed to place 2 Heplocks at bedside. -done. And tubing changed.

## 2015-06-27 NOTE — Progress Notes (Signed)
Pharmacy Antibiotic Note  Laurie [name] is a 60 y.o. female admitted on 06/24/2015 with cellulitis with large infected carbuncle of left perineum (s/p I&D 3/18) and UTI.  Pharmacy has been consulted for Vancomycin and Zosyn dosing.  SCr significantly increasing.  Plan: Hold vancomycin due to increasing SCr.  Check vancomycin level tonight 24 hours after last 1500 mg q24h dose.  Pt has received 3 doses of this regimen so far.  Will resume vancomycin following evaluation of level.  VT goal 15-20 mcg/ml for cellulitis with abscess. Continue Zosyn 3.375g IV q8h (4 hour infusion time) for now since CrCl still >20 ml/min. F/u E.coli sensitivites in urine, tissue cx.   Height: 5\' 4"  (162.6 cm) Weight: 208 lb 11.2 oz (94.666 kg) IBW/kg (Calculated) : 54.7  Temp (24hrs), Avg:97.9 F (36.6 C), Min:97.7 F (36.5 C), Max:98.2 F (36.8 C)   Recent Labs Lab 06/24/15 1839 06/24/15 1848 06/25/15 0506 06/26/15 0535 06/27/15 0436  WBC 16.8*  --  12.1* 14.4* 16.4*  CREATININE 1.14*  --  0.84 1.61* 2.35*  LATICACIDVEN  --  0.84  --   --   --     Estimated Creatinine Clearance: 28.1 mL/min (by C-G formula based on Cr of 2.35).    Allergies  Allergen Reactions  . Celebrex [Celecoxib] Itching  . Ciprofloxacin Other (See Comments)    No history of rash or SOB. Had kidney problems when she took cipro but was not told she had interstitial nephritis.  Marland Kitchen. Cymbalta [Duloxetine Hcl]     GI upset / constipation.   . Dilaudid [Hydromorphone Hcl] Itching  . Diuretic [Buchu-Cornsilk-Ch Grass-Hydran] Other (See Comments)    Dehydration. Elevated creatini  . Fentanyl And Related Itching  . Glucophage [Metformin Hcl] Other (See Comments)    Stopped due to increase creat  . Lyrica [Pregabalin] Other (See Comments)    CNS disorder. Headache  . Oxycodone Itching  . Morphine And Related Rash  . Sulfa Antibiotics Rash  . Vicodin [Hydrocodone-Acetaminophen] Rash    Antimicrobials this admission: Vanc  3/18 >> Zosy 3/18 >>  Dose adjustments this admission: 3/20 2100 VT = ____ (~24 hours following last dose, SCr significantly increasing)  Microbiology results: 3/18 UCx: E.coli  3/18 MRSA PCR: positive 3/18 Anaerobic/Tissue Cx: GPC in pairs on gram stain, cx reincubating  Thank you for allowing pharmacy to be a part of this patient's care.  Clance BollRunyon, Sabel Hornbeck 06/27/2015 10:07 AM

## 2015-06-27 NOTE — Care Management Important Message (Signed)
Important Message  Patient Details IM Letter given to [name]/Case Manager to present to Patient Name: Laurie [name] MRN: [id number] Date of Birth: October 17, 1955   Medicare Important Message Given:  Yes    Haskell FlirtJamison, Render Marley 06/27/2015, 12:04 PMImportant Message  Patient Details  Name: Laurie RecordsLavita Vincent Mcelwee MRN: [id number] Date of Birth: October 17, 1955   Medicare Important Message Given:  Yes    Haskell FlirtJamison, Zebastian Carico 06/27/2015, 12:04 PM

## 2015-06-27 NOTE — Progress Notes (Signed)
Patient Demographics  Laurie [name], is a 60 y.o. female, DOB - [birth year], WUJ:[id number]  Admit date - 06/24/2015   Admitting Physician Therisa Doyne, MD  Outpatient Primary MD for the patient is No primary care provider on file.  LOS - 3   Chief Complaint  Patient presents with  . Abscess       Admission HPI/Brief narrative: 60 year old female with history of diabetes mellitus, coronary artery disease, hypertension presents with left buttock abscess and cellulitis, seen by surgery, status post I&D on 3/18, Empirically on IV antibiotics for abscess and UTI, patient with worsening renal function during hospital stay.  Subjective:   Laurie [name] today has, No headache, No chest pain,No Nausea, Still complaines of mild abdominal pain.  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 -  meanwhile continue on broad-spectrum IV antibiotic given patient is diabetic, and worsening leukocytosis , and patient MRSA positive , but for now will hold vancomycin pending vancomycin random results(1 he likely will be elevated given her renal failure). - Wound culture Gram stain showing  gram-positive cocci in pairs, should be covered with vancomycin. - Patient with worsening leukocytosis, but she is a febrile, does not look toxic, check 2 view chest x-ray.  Diabetes mellitus - CBG better controlled after starting on Lantus and on insulin resistant sliding scale, will increase Lantus dose today as well,  Acute renal failure - Patient with significant increase of her creatinine, 0.84 on admission, today is  2.35, unclear if related to contrast-induced nephropathy, will continue with IV fluids, will check random Vanco level.  UTI - Continue with IV Zosyn - Urine culture growing Escherichia coli, pansensitive.  Coronary artery disease - Denies any chest pain or shortness of breath, continue with home medication including aspirin, statin and beta blockers  Hypertension - Continue with home medication  Asthma - Appears to be stable, continue with home medication  Candidal esophagitis - Continue with nystatin swish and swallow    Code Status: Full  Family Communication: Discussed with patient and significant other at bedside  Disposition Plan: Home once stable   Procedures  Incision and drainage of left buttock abscess   Consults   Gen. surgery   Medications  Scheduled Meds: . amLODipine  10 mg Oral Daily  . aspirin EC  81 mg Oral Daily  . bisacodyl  10 mg Oral Once  . bisacodyl  10 mg Rectal Once  . Chlorhexidine Gluconate Cloth  6 each Topical Q0600  . enoxaparin (LOVENOX) injection  30 mg Subcutaneous QHS  . FLUoxetine  40 mg Oral Daily  . gabapentin  600 mg Oral BID  . insulin aspart  0-20 Units Subcutaneous TID WC  . insulin aspart  0-5 Units Subcutaneous QHS  . insulin glargine  17 Units Subcutaneous BID  . isosorbide mononitrate  60 mg Oral Daily  . metoprolol succinate  100 mg Oral Daily  .  mometasone-formoterol  2 puff Inhalation BID  . mupirocin ointment  1 application Nasal BID  . nystatin  5 mL Oral QID  . pantoprazole  40 mg Oral Daily  . piperacillin-tazobactam (ZOSYN)  IV  3.375 g Intravenous 3 times per day  . polyethylene glycol  34 g Oral Once  . pravastatin  20 mg Oral QHS  . traZODone  50 mg Oral QHS   Continuous Infusions: . sodium chloride 75 mL/hr at 06/26/15 2027   PRN Meds:.acetaminophen **OR** acetaminophen, albuterol, diphenhydrAMINE, HYDROmorphone (DILAUDID) injection, HYDROmorphone, menthol-cetylpyridinium, ondansetron **OR**  ondansetron (ZOFRAN) IV, temazepam  DVT Prophylaxis  Lovenox   Lab Results  Component Value Date   PLT 439* 06/27/2015    Antibiotics    Anti-infectives    Start     Dose/Rate Route Frequency Ordered Stop   06/25/15 2200  vancomycin (VANCOCIN) 1,500 mg in sodium chloride 0.9 % 500 mL IVPB  Status:  Discontinued     1,500 mg 250 mL/hr over 120 Minutes Intravenous Every 24 hours 06/25/15 0026 06/27/15 1030   06/25/15 0030  vancomycin (VANCOCIN) 1,500 mg in sodium chloride 0.9 % 500 mL IVPB     1,500 mg 250 mL/hr over 120 Minutes Intravenous  Once 06/25/15 0025 06/25/15 0258   06/24/15 2345  piperacillin-tazobactam (ZOSYN) IVPB 3.375 g     3.375 g 12.5 mL/hr over 240 Minutes Intravenous 3 times per day 06/24/15 2332     06/24/15 2200  ceFAZolin (ANCEF) IVPB 1 g/50 mL premix     1 g 100 mL/hr over 30 Minutes Intravenous  Once 06/24/15 2151 06/24/15 2250          Objective:   Filed Vitals:   06/26/15 1443 06/26/15 2230 06/27/15 0615 06/27/15 0956  BP: 109/60 114/64 127/70   Pulse: 78 75 65 76  Temp: 97.7 F (36.5 C) 98.2 F (36.8 C) 97.7 F (36.5 C)   TempSrc: Oral Oral Oral   Resp: 16 18 18 18   Height:      Weight:      SpO2: 99% 98% 97% 96%    Wt Readings from Last 3 Encounters:  06/25/15 94.666 kg (208 lb 11.2 oz)     Intake/Output Summary (Last 24 hours) at 06/27/15 1424 Last data filed at 06/27/15 1000  Gross per 24 hour  Intake 2807.5 ml  Output    525 ml  Net 2282.5 ml     Physical Exam  Awake Alert, Oriented X 3, No new F.N deficits, Normal affect [place].AT,PERRAL Supple Neck,No JVD, No cervical lymphadenopathy appriciated.  Symmetrical Chest wall movement, Good air movement bilaterally, CTAB RRR,No Gallops,Rubs or new Murmurs, No Parasternal Heave +ve B.Sounds, Abd Soft, No tenderness, No organomegaly appriciated, No rebound - guarding or rigidity. No Cyanosis, Clubbing or edema, No new Rash or bruise  ,  Data Review   Micro Results Recent  Results (from the past 240 hour(s))  Urine culture     Status: None   Collection Time: 06/25/15 12:20 AM  Result Value Ref Range Status   Specimen Description URINE, CLEAN CATCH  Final   Special Requests NONE  Final   Culture   Final    >=100,000 COLONIES/mL ESCHERICHIA COLI Performed [facility]    Report Status 06/27/2015 FINAL  Final   Organism ID, Bacteria ESCHERICHIA COLI  Final      Susceptibility   Escherichia coli - MIC*    AMPICILLIN <=2 SENSITIVE Sensitive     CEFAZOLIN <=4 SENSITIVE Sensitive  CEFTRIAXONE <=1 SENSITIVE Sensitive     CIPROFLOXACIN <=0.25 SENSITIVE Sensitive     GENTAMICIN <=1 SENSITIVE Sensitive     IMIPENEM <=0.25 SENSITIVE Sensitive     NITROFURANTOIN <=16 SENSITIVE Sensitive     TRIMETH/SULFA <=20 SENSITIVE Sensitive     AMPICILLIN/SULBACTAM <=2 SENSITIVE Sensitive     PIP/TAZO <=4 SENSITIVE Sensitive     * >=100,000 COLONIES/mL ESCHERICHIA COLI  Surgical pcr screen     Status: Abnormal   Collection Time: 06/25/15  9:10 AM  Result Value Ref Range Status   MRSA, PCR POSITIVE (A) NEGATIVE Final    Comment: RESULT CALLED TO, READ BACK BY AND VERIFIED WITH: P RAMIREZ AT 1100 ON 03.18.2017 BY NBROOKS    Staphylococcus aureus POSITIVE (A) NEGATIVE Final    Comment:        The Xpert SA Assay (FDA approved for NASAL specimens in patients over 10 years of age), is one component of a comprehensive surveillance program.  Test performance has been validated by [facility] for patients greater than or equal to 42 year old. It is not intended to diagnose infection nor to guide or monitor treatment.   Anaerobic culture     Status: None (Preliminary result)   Collection Time: 06/25/15 11:25 AM  Result Value Ref Range Status   Specimen Description BUTTOCKS LEFT TISSUE  Final   Special Requests NONE  Final   Gram Stain   Final    ABUNDANT WBC PRESENT, PREDOMINANTLY PMN ABUNDANT GRAM POSITIVE COCCI IN PAIRS IN CLUSTERS Performed at  [facility]    Culture PENDING  Incomplete   Report Status PENDING  Incomplete  Tissue culture     Status: None (Preliminary result)   Collection Time: 06/25/15 11:25 AM  Result Value Ref Range Status   Specimen Description BUTTOCKS LEFT TISSUE  Final   Special Requests NONE  Final   Gram Stain   Final    ABUNDANT WBC PRESENT, PREDOMINANTLY PMN ABUNDANT GRAM POSITIVE COCCI IN PAIRS IN CLUSTERS Performed at [facility]    Culture   Final    Culture reincubated for better growth Performed at [facility]    Report Status PENDING  Incomplete    Radiology Reports Ct Pelvis W Contrast  06/24/2015  CLINICAL DATA:  Weeping portal at the left lower buttocks, near the rectum. Pain and swelling. Initial encounter. EXAM: CT PELVIS WITH CONTRAST TECHNIQUE: Multidetector CT imaging of the pelvis was performed using the standard protocol following the bolus administration of intravenous contrast. CONTRAST:  OMNIPAQUE IOHEXOL 300 MG/ML  SOLN COMPARISON:  None. FINDINGS: Focal soft tissue inflammation is noted along the left buttock, tracking along the proximal left thigh. There is suggestion of an associated small area of fluid measuring 2.3 x 1.4 cm, though no well defined abscess is seen. This may simply reflect more focal soft tissue edema. The patient's urostomy is grossly unremarkable in appearance. Visualized small and large bowel loops are grossly unremarkable, aside from diverticulosis along the sigmoid colon. No acute osseous abnormalities are identified. Scattered vascular calcifications are seen. IMPRESSION: 1. Focal soft tissue inflammation along the left buttock, tracking along the proximal left thigh. Suggestion of associated small area of fluid measuring 2.3 x 1.4 cm, though no well defined abscess is seen. This may simply reflect more focal soft tissue edema. 2. Diverticulosis along the sigmoid colon. 3. Scattered vascular calcifications seen. Electronically  Signed   By: Roanna Raider M.D.   On: 06/24/2015 21:30  Dg Abd Portable 1v  06/26/2015  CLINICAL DATA:  Abdominal cramping for 1 week EXAM: PORTABLE ABDOMEN - 1 VIEW COMPARISON:  None. FINDINGS: Scattered large and small bowel gas is noted. Postsurgical changes are seen. No abnormal mass or abnormal calcifications are noted. No free air is seen. No acute bony abnormality is noted. IMPRESSION: No acute abnormality noted. Electronically Signed   By: Alcide Clever M.D.   On: 06/26/2015 18:06     CBC  Recent Labs Lab 06/24/15 1839 06/25/15 0506 06/26/15 0535 06/27/15 0436  WBC 16.8* 12.1* 14.4* 16.4*  HGB 12.4 11.4* 11.1* 10.9*  HCT 35.5* 34.6* 33.8* 34.1*  PLT 412* 381 421* 439*  MCV 91.5 95.8 92.3 97.7  MCH 32.0 31.6 30.3 31.2  MCHC 34.9 32.9 32.8 32.0  RDW 13.5 13.6 13.2 13.6  LYMPHSABS 3.1  --   --   --   MONOABS 1.5*  --   --   --   EOSABS 0.4  --   --   --   BASOSABS 0.0  --   --   --     Chemistries   Recent Labs Lab 06/24/15 1839 06/25/15 0506 06/26/15 0535 06/27/15 0436  NA 141 139 137 142  K 3.7 3.3* 4.1 3.9  CL 103 105 106 108  CO2 GLUCOSE 92 91 174* 181*  BUN 25*  CREATININE 1.14* 0.84 1.61* 2.35*  CALCIUM 9.6 8.5* 8.8* 8.7*  MG  --  1.4*  --   --   AST 19 17  --   --   ALT 18 15  --   --   ALKPHOS 75 60  --   --   BILITOT 0.7 0.2*  --   --    ------------------------------------------------------------------------------------------------------------------ estimated creatinine clearance is 28.1 mL/min (by C-G formula based on Cr of 2.35). ------------------------------------------------------------------------------------------------------------------  Recent Labs  06/25/15 0506  HGBA1C 6.2*   ------------------------------------------------------------------------------------------------------------------ No results for input(s): CHOL, HDL, LDLCALC, TRIG, CHOLHDL, LDLDIRECT in the last 72  hours. ------------------------------------------------------------------------------------------------------------------  Recent Labs  06/25/15 0506  TSH 1.600   ------------------------------------------------------------------------------------------------------------------ No results for input(s): VITAMINB12, FOLATE, FERRITIN, TIBC, IRON, RETICCTPCT in the last 72 hours.  Coagulation profile No results for input(s): INR, PROTIME in the last 168 hours.  No results for input(s): DDIMER in the last 72 hours.  Cardiac Enzymes No results for input(s): CKMB, TROPONINI, MYOGLOBIN in the last 168 hours.  Invalid input(s): CK ------------------------------------------------------------------------------------------------------------------ Invalid input(s): POCBNP     Time Spent in minutes   30 minutes   Zuha Dejonge M.D on 06/27/2015 at 2:24 PM  Between 7am to 7pm - Pager - [phone]  After 7pm go to [email or web address] - password [facility]  [facility]   Office  [phone]

## 2015-06-27 NOTE — Progress Notes (Signed)
PHARMACIST - PHYSICIAN COMMUNICATION  CONCERNING: IV to Oral Route Change Policy  RECOMMENDATION: This patient is receiving diphenhydramine by the intravenous route.  Based on criteria approved by the Pharmacy and Therapeutics Committee, intravenous diphenhydramine is being converted to the equivalent oral dose form(s).   DESCRIPTION: These criteria include:  Diphenhydramine is not prescribed to treat or prevent a severe allergic reaction  Diphenhydramine is not prescribed as premedication prior to receiving blood product, biologic medication, antimicrobial, or chemotherapy agent  The patient has tolerated at least one dose of an oral or enteral medication  The patient has no evidence of active gastrointestinal bleeding or impaired GI absorption (gastrectomy, short bowel, patient on TNA or NPO).  The patient is not undergoing procedural sedation   If you have questions about this conversion, please contact the Pharmacy Department  []   [phone]( [phone] )  [facility] []   [phone]( [phone] )  [facility] []   [phone]( [phone]  [facility] []   [phone]( [phone] )  [facility] [x]   [phone]( [phone] )  [facility]     -Grace IsaacYuhong Winston Sobczyk, PharmD candidate 06/27/2015, 8:16 AM

## 2015-06-27 NOTE — Progress Notes (Signed)
Pharmacy Re: Vancomycin  O: Random Vanc Level = 19 at 1430 (~17hrs after previous dose given)      A:  Patient on Vancomycin for cellulitis with abscess.  Goal trough level immediately prior to next dose =15-20.   Patient appears to be clearing Vancomycin despite increase in Scr.   Since random level ordered by MD already within desired range & short course anticipated, will re-dose Vancomycin tonight and cancel trough level also ordered for tonight.     P: 1.  Continue Vancomycin at 10pm tonight- Empirically decrease Vancomycin 1gm IV q24h due to declining renal function. 2.  F/U renal fxn, cx sensitvities- de-escalate therapy as soon as appropriate.   Junita PushMichelle Elma Shands, PharmD, BCPS Pager: [phone] 06/27/2015@8 :26 PM

## 2015-06-27 NOTE — Progress Notes (Signed)
2 Days Post-Op  Subjective: Complaining of her throat hurting.  Buttocks looks fine.  I would let her shower and switch to oral pain med and antibiotic for it.  She can shower with the open wound and allow soap and water into site.  She also complains of constipation.  Objective: Vital signs in last 24 hours: Temp:  [97.7 F (36.5 C)-98.2 F (36.8 C)] 97.7 F (36.5 C) (03/20 0615) Pulse Rate:  [65-89] 65 (03/20 0615) Resp:  [16-18] 18 (03/20 0615) BP: (106-127)/(45-70) 127/70 mmHg (03/20 0615) SpO2:  [96 %-99 %] 97 % (03/20 0615) Last BM Date: 06/26/15 600 PO  Carb Modified diet Afebrile, VSS Labs show rising creatinine WBC is also up Intake/Output from previous day: 03/19 0701 - 03/20 0700 In: 1887.5 [P.O.:600; I.V.:637.5; IV Piggyback:650] Out: 825 [Urine:825] Intake/Output this shift:    General appearance: alert, cooperative and no distress Skin: Skin color, texture, turgor normal. No rashes or lesions or open site left buttocks clean, still somewhat indurated around it.  No significant cellulitis  Lab Results:   Recent Labs  06/26/15 0535 06/27/15 0436  WBC 14.4* 16.4*  HGB 11.1* 10.9*  HCT 33.8* 34.1*  PLT 421* 439*    BMET  Recent Labs  06/26/15 0535 06/27/15 0436  NA 137 142  K 4.1 3.9  CL 106 108  CO2 24 24  GLUCOSE 174* 181*  BUN 20 25*  CREATININE 1.61* 2.35*  CALCIUM 8.8* 8.7*   PT/INR No results for input(s): LABPROT, INR in the last 72 hours.   Recent Labs Lab 06/24/15 1839 06/25/15 0506  AST 19 17  ALT 18 15  ALKPHOS 75 60  BILITOT 0.7 0.2*  PROT 8.1 6.6  ALBUMIN 4.0 3.1*     Lipase     Component Value Date/Time   LIPASE 28 12/28/2014 1529     Studies/Results: Dg Abd Portable 1v  06/26/2015  CLINICAL DATA:  Abdominal cramping for 1 week EXAM: PORTABLE ABDOMEN - 1 VIEW COMPARISON:  None. FINDINGS: Scattered large and small bowel gas is noted. Postsurgical changes are seen. No abnormal mass or abnormal calcifications are  noted. No free air is seen. No acute bony abnormality is noted. IMPRESSION: No acute abnormality noted. Electronically Signed   By: Alcide CleverMark  Lukens M.D.   On: 06/26/2015 18:06    Medications: . amLODipine  10 mg Oral Daily  . aspirin EC  81 mg Oral Daily  . Chlorhexidine Gluconate Cloth  6 each Topical Q0600  . enoxaparin (LOVENOX) injection  30 mg Subcutaneous QHS  . FLUoxetine  40 mg Oral Daily  . gabapentin  600 mg Oral BID  . insulin aspart  0-20 Units Subcutaneous TID WC  . insulin aspart  0-5 Units Subcutaneous QHS  . insulin glargine  17 Units Subcutaneous BID  . isosorbide mononitrate  60 mg Oral Daily  . metoprolol succinate  100 mg Oral Daily  . mometasone-formoterol  2 puff Inhalation BID  . mupirocin ointment  1 application Nasal BID  . nystatin  5 mL Oral QID  . pantoprazole  40 mg Oral Daily  . piperacillin-tazobactam (ZOSYN)  IV  3.375 g Intravenous 3 times per day  . pravastatin  20 mg Oral QHS  . traZODone  50 mg Oral QHS  . vancomycin  1,500 mg Intravenous Q24H      . sodium chloride 75 mL/hr at 06/26/15 2027   Prior to Admission medications   Medication Sig Start Date End Date Taking? Authorizing Provider  albuterol (PROVENTIL HFA;VENTOLIN HFA) 108 (90 BASE) MCG/ACT inhaler Inhale 2 puffs into the lungs every 4 (four) hours as needed for wheezing or shortness of breath.   Yes Historical Provider, MD  amLODipine (NORVASC) 5 MG tablet Take 10 mg by mouth daily.    Yes Historical Provider, MD  aspirin EC 81 MG tablet Take 81 mg by mouth daily.   Yes Historical Provider, MD  budesonide-formoterol (SYMBICORT) 80-4.5 MCG/ACT inhaler Inhale 2 puffs into the lungs 2 (two) times daily.   Yes Historical Provider, MD  cholecalciferol (VITAMIN D) 1000 UNITS tablet Take 1,000 Units by mouth daily.   Yes Historical Provider, MD  colestipol (COLESTID) 1 G tablet Take 1 g by mouth daily.    Yes Historical Provider, MD  cyanocobalamin 1000 MCG tablet Take 1,000 mcg by mouth daily.     Yes Historical Provider, MD  FLUoxetine (PROZAC) 20 MG capsule Take 40 mg by mouth daily.   Yes Historical Provider, MD  gabapentin (NEURONTIN) 300 MG capsule Take 600 mg by mouth 2 (two) times daily.   Yes Historical Provider, MD  hydrocortisone (ANUSOL-HC) 2.5 % rectal cream Place 1 application rectally 3 (three) times daily as needed for hemorrhoids.    Yes Historical Provider, MD  insulin regular human CONCENTRATED (HUMULIN R) 500 UNIT/ML injection Inject 8-15 Units into the skin 3 (three) times daily. Take 10 units at breakfast, 15 units at lunch and 8 units at dinner.   Yes Historical Provider, MD  isosorbide mononitrate (IMDUR) 60 MG 24 hr tablet Take 60 mg by mouth daily.   Yes Historical Provider, MD  LIRAGLUTIDE [place] Inject 1.8 mg into the skin daily.   Yes Historical Provider, MD  lisinopril (PRINIVIL,ZESTRIL) 2.5 MG tablet Take 2.5 mg by mouth daily.   Yes Historical Provider, MD  loperamide (IMODIUM A-D) 2 MG tablet Take 2 mg by mouth 4 (four) times daily as needed for diarrhea or loose stools.   Yes Historical Provider, MD  magnesium oxide (MAG-OX) 400 MG tablet Take 400 mg by mouth 2 (two) times daily.   Yes Historical Provider, MD  metFORMIN (GLUCOPHAGE-XR) 500 MG 24 hr tablet Take 1,000 mg by mouth daily with breakfast.   Yes Historical Provider, MD  metoprolol succinate (TOPROL-XL) 100 MG 24 hr tablet Take 100 mg by mouth daily. Take with or immediately following a meal.   Yes Historical Provider, MD  Multiple Vitamin (MULTIVITAMIN WITH MINERALS) TABS tablet Take 1 tablet by mouth daily.   Yes Historical Provider, MD  nystatin (MYCOSTATIN) 100000 UNIT/ML suspension Take 5 mLs by mouth 4 (four) times daily.   Yes Historical Provider, MD  pantoprazole (PROTONIX) 40 MG tablet Take 40 mg by mouth daily.   Yes Historical Provider, MD  pravastatin (PRAVACHOL) 20 MG tablet Take 20 mg by mouth at bedtime.   Yes Historical Provider, MD  promethazine (PHENERGAN) 25 MG tablet Take 25 mg by  mouth every 6 (six) hours as needed for nausea or vomiting.   Yes Historical Provider, MD  ranitidine (ZANTAC) 300 MG tablet Take 300 mg by mouth at bedtime.   Yes Historical Provider, MD  temazepam (RESTORIL) 7.5 MG capsule Take 7.5 mg by mouth at bedtime as needed for sleep.   Yes Historical Provider, MD  traZODone (DESYREL) 50 MG tablet Take 50 mg by mouth at bedtime.   Yes Historical Provider, MD  triamcinolone cream (KENALOG) 0.5 % Apply 1 application topically 2 (two) times daily as needed (for skin irritation).    Yes  Historical Provider, MD  cephALEXin (KEFLEX) 500 MG capsule Take 2 capsules (1,000 mg total) by mouth 3 (three) times daily. Patient not taking: Reported on 06/24/2015 12/28/14   Richardean Canal, MD  fluticasone [facility]) 50 MCG/ACT nasal spray Place 2 sprays into both nostrils daily as needed for allergies or rhinitis.    Historical Provider, MD  glucagon (GLUCAGON EMERGENCY) 1 MG injection Inject 1 mg into the vein once as needed (severe insulin reaction).    Historical Provider, MD  lidocaine (XYLOCAINE) 5 % ointment Apply 1 application topically 4 (four) times daily as needed for moderate pain.    Historical Provider, MD  metroNIDAZOLE (FLAGYL) 500 MG tablet Take 1 tablet (500 mg total) by mouth 2 (two) times daily. Patient not taking: Reported on 06/24/2015 12/28/14   Richardean Canal, MD  nitroGLYCERIN (NITROSTAT) 0.4 MG SL tablet Place 0.4 mg under the tongue every 5 (five) minutes as needed for chest pain.    Historical Provider, MD  Nitroglycerin (RECTIV) 0.4 % OINT Place 1 application rectally 2 (two) times daily as needed (for chest pain.).     Historical Provider, MD  oxyCODONE-acetaminophen (PERCOCET) 5-325 MG per tablet Take 1-2 tablets by mouth every 6 (six) hours as needed. Patient not taking: Reported on 06/24/2015 12/28/14   Richardean Canal, MD  Assessment/Plan Left buttocks abscess S/p I&D 06/25/15, Dr. Romie Levee AODM Acute renal failure UTI Hypertension   Asthma Candidal esophagitis Constipation Multiple medical allergies (11 recorded) Body mass index is 35.8 Antibiotics:  Day 4 vancomycin and Zosyn DVT:  Lovenox/SCD   Plan:  From our standpoint she can shower, and allow soap and water into the site.  She can transition to oral pain meds and antibiotics.  I will get her follow up in our clinic.  She can continue wet to dry dressing till it heals.  Defer other issues to Dr. Randol Kern.   LOS: 3 days    Jud Fanguy 06/27/2015

## 2015-06-27 NOTE — Discharge Instructions (Signed)
Perirectal Abscess An abscess is an infected area that contains a collection of pus. A perirectal abscess is an abscess that is near the opening of the anus or around the rectum. A perirectal abscess can cause a lot of pain, especially during bowel movements. CAUSES This condition is almost always caused by an infection that starts in an anal gland. RISK FACTORS This condition is more likely to develop in:  People with diabetes or inflammatory bowel disease.  People whose body defense system (immune system) is weak.  People who have anal sex.  People who have a sexually transmitted disease (STD).  People who have certain kinds of cancers, such as rectal carcinoma, leukemia, or lymphoma. SYMPTOMS The main symptom of this condition is pain. The pain may be a throbbing pain that gets worse during bowel movements. Other symptoms include:  Fever.  Swelling.  Redness.  Bleeding.  Constipation. DIAGNOSIS The condition is diagnosed with a physical exam. If the abscess is not visible, a health care provider may need to place a finger inside the rectum to find the abscess. Sometimes, imaging tests are done to determine the size and location of the abscess. These tests may include:  An ultrasound.  An MRI.  A CT scan. TREATMENT This condition is usually treated with incision and drainage surgery. Incision and drainage surgery involves making an incision over the abscess to drain the pus. Treatment may also involve antibiotic medicine, pain medicine, stool softeners, or laxatives. HOME CARE INSTRUCTIONS  Take medicines only as directed by your health care provider.  If you were prescribed an antibiotic, finish all of it even if you start to feel better.  To relieve pain, try sitting:  In a warm, shallow bath (sitz bath).  On a heating pad with the setting on low.  On an inflatable donut-shaped cushion.  Follow any diet instructions as directed by your health care  provider.  Keep all follow-up visits as directed by your health care provider. This is important. SEEK MEDICAL CARE IF:  Your abscess is bleeding.  You have pain, swelling, or redness that is getting worse.  You are constipated.  You feel ill.  You have muscle aches or chills.  You have a fever.  Your symptoms return after the abscess has healed.   This information is not intended to replace advice given to you by your health care provider. Make sure you discuss any questions you have with your health care provider.   Document Released: 03/23/2000 Document Revised: 12/15/2014 Document Reviewed: 02/03/2014 Elsevier Interactive Patient Education 2016 [employer]  

## 2015-06-28 ENCOUNTER — Inpatient Hospital Stay (HOSPITAL_COMMUNITY): Payer: Medicare Other

## 2015-06-28 LAB — BASIC METABOLIC PANEL
Anion gap: 9 (ref 5–15)
BUN: 28 mg/dL — AB (ref 6–20)
CHLORIDE: 108 mmol/L (ref 101–111)
CO2: 25 mmol/L (ref 22–32)
Calcium: 8.6 mg/dL — ABNORMAL LOW (ref 8.9–10.3)
Creatinine, Ser: 2.62 mg/dL — ABNORMAL HIGH (ref 0.44–1.00)
GFR calc Af Amer: 22 mL/min — ABNORMAL LOW (ref >60)
GFR, EST NON AFRICAN AMERICAN: 19 mL/min — AB (ref >60)
GLUCOSE: 168 mg/dL — AB (ref 65–99)
Potassium: 4.2 mmol/L (ref 3.5–5.1)
SODIUM: 142 mmol/L (ref 135–145)

## 2015-06-28 LAB — GLUCOSE, CAPILLARY
GLUCOSE-CAPILLARY: 118 mg/dL — AB (ref 65–99)
GLUCOSE-CAPILLARY: 216 mg/dL — AB (ref 65–99)
Glucose-Capillary: 161 mg/dL — ABNORMAL HIGH (ref 65–99)
Glucose-Capillary: 200 mg/dL — ABNORMAL HIGH (ref 65–99)

## 2015-06-28 LAB — URINALYSIS, ROUTINE W REFLEX MICROSCOPIC
Bilirubin Urine: NEGATIVE
Glucose, UA: NEGATIVE mg/dL
Hgb urine dipstick: NEGATIVE
KETONES UR: NEGATIVE mg/dL
LEUKOCYTES UA: NEGATIVE
NITRITE: NEGATIVE
PH: 7 (ref 5.0–8.0)
PROTEIN: NEGATIVE mg/dL
Specific Gravity, Urine: 1.012 (ref 1.005–1.030)

## 2015-06-28 LAB — CREATININE, URINE, RANDOM: CREATININE, URINE: 76.99 mg/dL

## 2015-06-28 LAB — CBC
HCT: 34.1 % — ABNORMAL LOW (ref 36.0–46.0)
HEMOGLOBIN: 10.9 g/dL — AB (ref 12.0–15.0)
MCH: 31.5 pg (ref 26.0–34.0)
MCHC: 32 g/dL (ref 30.0–36.0)
MCV: 98.6 fL (ref 78.0–100.0)
PLATELETS: 429 10*3/uL — AB (ref 150–400)
RBC: 3.46 MIL/uL — ABNORMAL LOW (ref 3.87–5.11)
RDW: 13.9 % (ref 11.5–15.5)
WBC: 12.5 10*3/uL — ABNORMAL HIGH (ref 4.0–10.5)

## 2015-06-28 LAB — SODIUM, URINE, RANDOM: SODIUM UR: 52 mmol/L

## 2015-06-28 LAB — VANCOMYCIN, TROUGH: VANCOMYCIN TR: 14 ug/mL (ref 10.0–20.0)

## 2015-06-28 MED ORDER — SODIUM CHLORIDE 0.9 % IV SOLN: 1250.0000 mg | INTRAVENOUS | Status: DC

## 2015-06-28 MED ORDER — CEFTRIAXONE SODIUM 1 G IJ SOLR
1.0000 g | INTRAMUSCULAR | Status: DC
Start: 2015-06-28 — End: 2015-06-30
  Administered 2015-06-28 – 2015-06-29 (×2): 1 g via INTRAVENOUS
  Filled 2015-06-28 (×2): qty 10

## 2015-06-28 MED ORDER — INSULIN ASPART 100 UNIT/ML ~~LOC~~ SOLN
4.0000 [IU] | Freq: Three times a day (TID) | SUBCUTANEOUS | Status: DC
  Administered 2015-06-28 – 2015-07-01 (×7): 4 [IU] via SUBCUTANEOUS

## 2015-06-28 MED ORDER — POLYETHYLENE GLYCOL 3350 17 G PO PACK
17.0000 g | PACK | Freq: Every day | ORAL | Status: DC
  Administered 2015-06-28 – 2015-07-01 (×4): 17 g via ORAL
  Filled 2015-06-28 (×4): qty 1

## 2015-06-28 MED ORDER — SODIUM CHLORIDE 0.9% FLUSH
10.0000 mL | INTRAVENOUS | Status: DC | PRN
  Administered 2015-06-28 – 2015-06-30 (×3): 10 mL
  Filled 2015-06-28 (×3): qty 40

## 2015-06-28 MED ORDER — SENNOSIDES-DOCUSATE SODIUM 8.6-50 MG PO TABS
1.0000 | ORAL_TABLET | Freq: Two times a day (BID) | ORAL | Status: DC
  Administered 2015-06-28 – 2015-07-01 (×6): 1 via ORAL
  Filled 2015-06-28 (×8): qty 1

## 2015-06-28 MED ORDER — INSULIN ASPART 100 UNIT/ML ~~LOC~~ SOLN
0.0000 [IU] | Freq: Three times a day (TID) | SUBCUTANEOUS | Status: DC
  Administered 2015-06-28: 3 [IU] via SUBCUTANEOUS
  Administered 2015-06-29: 5 [IU] via SUBCUTANEOUS
  Administered 2015-06-29 – 2015-06-30 (×2): 3 [IU] via SUBCUTANEOUS
  Administered 2015-06-30: 2 [IU] via SUBCUTANEOUS
  Administered 2015-06-30: 11 [IU] via SUBCUTANEOUS
  Administered 2015-07-01: 3 [IU] via SUBCUTANEOUS

## 2015-06-28 NOTE — Progress Notes (Signed)
Dr. Randol KernElgergawy attempted IV placement in Left external jugular area and right chest area. Unsuccessful. He will check creatinine and determine if PICC can be placed.

## 2015-06-28 NOTE — Progress Notes (Signed)
Peripherally Inserted Central Catheter/Midline Placement  The IV Nurse has discussed with the patient and/or persons authorized to consent for the patient, the purpose of this procedure and the potential benefits and risks involved with this procedure.  The benefits include less needle sticks, lab draws from the catheter and patient may be discharged home with the catheter.  Risks include, but not limited to, infection, bleeding, blood clot (thrombus formation), and puncture of an artery; nerve damage and irregular heat beat.  Alternatives to this procedure were also discussed.  PICC/Midline Placement Documentation        Laurie [name], Laurie [name] Laurie [name] 06/28/2015, 4:54 PM

## 2015-06-28 NOTE — Progress Notes (Signed)
Pharmacy Antibiotic Note  Laurie Arlana HoveVincent [name] is a 60 y.o. female admitted on 06/24/2015 with cellulitis with large infected carbuncle of left perineum (s/p I&D 3/18) and UTI.  Pharmacy has been consulted for Vancomycin and Zosyn dosing.  SCr significantly increasing.  Surgery documents that I&D site looks good.  Plan: With SCr continuing to increase, will check a level prior to next dose to make sure appropriate to continue current dosing of 1g IV q24h.  VT goal 15-20 mcg/ml for cellulitis with abscess. Continue Zosyn 3.375g IV q8h (4 hour infusion time) for now since CrCl still >20 ml/min.   F/u wound culture results showing GPC on gram stain for de-escalation of vancomycin.  Could narrow Zosyn today based on E.coli UTI sensitivities.  Height: 5\' 4"  (162.6 cm) Weight: 208 lb 11.2 oz (94.666 kg) IBW/kg (Calculated) : 54.7  Temp (24hrs), Avg:98.2 F (36.8 C), Min:97.5 F (36.4 C), Max:99.3 F (37.4 C)   Recent Labs Lab 06/24/15 1839 06/24/15 1848 06/25/15 0506 06/26/15 0535 06/27/15 0436 06/27/15 1427 06/28/15 0444  WBC 16.8*  --  12.1* 14.4* 16.4*  --  12.5*  CREATININE 1.14*  --  0.84 1.61* 2.35*  --  2.62*  LATICACIDVEN  --  0.84  --   --   --   --   --   VANCORANDOM  --   --   --   --   --  19  --     Estimated Creatinine Clearance: 25.2 mL/min (by C-G formula based on Cr of 2.62).    Allergies  Allergen Reactions  . Celebrex [Celecoxib] Itching  . Ciprofloxacin Other (See Comments)    No history of rash or SOB. Had kidney problems when she took cipro but was not told she had interstitial nephritis.  Marland Kitchen. Cymbalta [Duloxetine Hcl]     GI upset / constipation.   . Dilaudid [Hydromorphone Hcl] Itching  . Diuretic [Buchu-[place]sil[place]-Ch Grass-Hydran] Other (See Comments)    Dehydration. Elevated creatini  . Fentanyl And Related Itching  . Glucophage [Metformin Hcl] Other (See Comments)    Stopped due to increase creat  . Lyrica [Pregabalin] Other (See Comments)    CNS  disorder. Headache  . Oxycodone Itching  . Morphine And Related Rash  . Sulfa Antibiotics Rash  . Vicodin [Hydrocodone-Acetaminophen] Rash    Antimicrobials this admission: Vanc 3/18 >> Zosyn 3/18 >>  Dose adjustments this admission: 3/20 RVL@1427 = 19 (following 1500 mg q24h x 3 doses, level collected ~17 hours following last dose, SCr significantly increasing), Vanc resumed at 1g IV q24h 3/21 2100 VT = ____ (SCr continues to increase)  Microbiology results: 3/18 UCx: E.coli (pan-sensitive) 3/18 MRSA PCR: positive 3/18 Anaerobic/Tissue Cx: GPC in pairs/clusters on gram stain, cx reincubating. No anaerobes isolated.  Thank you for allowing pharmacy to be a part of this patient's care.  Clance BollRunyon, Riah Kehoe 06/28/2015 10:18 AM

## 2015-06-28 NOTE — Progress Notes (Signed)
Inpatient Diabetes Program Recommendations  AACE/ADA: New Consensus Statement on Inpatient Glycemic Control (2015)  Target Ranges:  Prepandial:   less than 140 mg/dL      Peak postprandial:   less than 180 mg/dL (1-2 hours)      Critically ill patients:  140 - 180 mg/dL   Results for [name] RecordsBASS, Dagny VINCENT (MRN [id number]) as of 06/28/2015 09:41  Ref. Range 06/27/2015 08:04 06/27/2015 12:16 06/27/2015 16:37 06/27/2015 22:09  Glucose-Capillary Latest Ref Range: 65-99 mg/dL 409145 (H) 811207 (H) 914198 (H) 255 (H)   Results for [name] RecordsBASS, Kirsty VINCENT (MRN [id number]) as of 06/28/2015 09:41  Ref. Range 06/28/2015 07:42  Glucose-Capillary Latest Ref Range: 65-99 mg/dL 086118 (H)   Results for [name] RecordsBASS, Areana VINCENT (MRN [id number]) as of 06/28/2015 09:41  Ref. Range 06/25/2015 05:06  Hemoglobin A1C Latest Ref Range: 4.8-5.6 % 6.2 (H)    Admit Cellulitis/Abscess of Buttocks.   History: DM, CKD  Home DM Meds: Metformin 1000 mg daily       Victoza 1.8 mg daily        U-500 insulin- 10 units Bfast/ 15 units Lunch/ 8 units Dinner  Current Insulin Orders: Lantus 17 units bid      Novolog Resistant Correction Scale/ SSI (0-20 units) TID AC + HS      -Glucose well controlled at home as evidenced by A1c of 6.2%.  -Having some elevated postprandial glucose readings here in hospital.  -Eating 100% of meals per documentation.     MD- Please consider the following in-hospital insulin adjustments while patient's U-500 insulin on hold:  1. Leave Lantus at current dose- Fasting glucose this AM 118 mg/dl  2. Decrease Novolog Correction Scale/ SSI to Moderate scale (0-15 units) TID AC + HS (Currently ordered as Resistant scale 0-20 units)  3. Start Novolog Meal Coverage- Novolog 4 units tidwc  4. Patient can resume home dose of U-500 insulin at time of discharge     --Will follow patient during hospitalization--  Ambrose FinlandJeannine Johnston Mariapaula Krist RN, MSN, CDE Diabetes Coordinator Inpatient Glycemic Control  Team Team Pager: [phone] (8a-5p)

## 2015-06-28 NOTE — Progress Notes (Signed)
3 Days Post-Op  Subjective: She is doing well, I examined the site well this AM and it looks fine.  This is not the source of the elevated WBC.    Objective: Vital signs in last 24 hours: Temp:  [97.5 F (36.4 C)-99.3 F (37.4 C)] 97.5 F (36.4 C) (03/21 0625) Pulse Rate:  [76-108] 78 (03/21 0625) Resp:  [18-20] 18 (03/21 0625) BP: (116-138)/(69-78) 130/76 mmHg (03/21 0625) SpO2:  [96 %-97 %] 97 % (03/21 0625) Last BM Date: 06/27/15 1080 PO  Carb mod diet Afebrile, VSS WBC is better, creatinine is up some CXR is negative yesterday  Intake/Output from previous day: 03/20 0701 - 03/21 0700 In: 1880 [P.O.:1080; I.V.:750; IV Piggyback:50] Out: 1400 [Urine:1400] Intake/Output this shift:    General appearance: alert, cooperative and no distress Skin: Skin color, texture, turgor normal. No rashes or lesions or cellulitis resolving, the open area is clean with minimal exudate.    Lab Results:   Recent Labs  06/27/15 0436 06/28/15 0444  WBC 16.4* 12.5*  HGB 10.9* 10.9*  HCT 34.1* 34.1*  PLT 439* 429*    BMET  Recent Labs  06/27/15 0436 06/28/15 0444  NA 142 142  K 3.9 4.2  CL 108 108  CO2 24 25  GLUCOSE 181* 168*  BUN 25* 28*  CREATININE 2.35* 2.62*  CALCIUM 8.7* 8.6*   PT/INR No results for input(s): LABPROT, INR in the last 72 hours.   Recent Labs Lab 06/24/15 1839 06/25/15 0506  AST 19 17  ALT 18 15  ALKPHOS 75 60  BILITOT 0.7 0.2*  PROT 8.1 6.6  ALBUMIN 4.0 3.1*     Lipase     Component Value Date/Time   LIPASE 28 12/28/2014 1529     Studies/Results: Dg Chest 2 View  06/27/2015  CLINICAL DATA:  Patient reports cough, medicated hypertension and ex-smoker. EXAM: CHEST - 2 VIEW COMPARISON:  05/02/2007 FINDINGS: Lungs are clear. Heart size and mediastinal contours are within normal limits. No effusion. Visualized skeletal structures are unremarkable. IMPRESSION: No acute cardiopulmonary disease. Electronically Signed   By: Corlis Leak  Hassell M.D.   On:  06/27/2015 16:05   Dg Abd Portable 1v  06/26/2015  CLINICAL DATA:  Abdominal cramping for 1 week EXAM: PORTABLE ABDOMEN - 1 VIEW COMPARISON:  None. FINDINGS: Scattered large and small bowel gas is noted. Postsurgical changes are seen. No abnormal mass or abnormal calcifications are noted. No free air is seen. No acute bony abnormality is noted. IMPRESSION: No acute abnormality noted. Electronically Signed   By: Alcide CleverMark  Lukens M.D.   On: 06/26/2015 18:06    Medications: . amLODipine  10 mg Oral Daily  . aspirin EC  81 mg Oral Daily  . Chlorhexidine Gluconate Cloth  6 each Topical Q0600  . enoxaparin (LOVENOX) injection  30 mg Subcutaneous QHS  . FLUoxetine  40 mg Oral Daily  . gabapentin  600 mg Oral BID  . insulin aspart  0-20 Units Subcutaneous TID WC  . insulin aspart  0-5 Units Subcutaneous QHS  . insulin glargine  17 Units Subcutaneous BID  . isosorbide mononitrate  60 mg Oral Daily  . metoprolol succinate  100 mg Oral Daily  . mometasone-formoterol  2 puff Inhalation BID  . mupirocin ointment  1 application Nasal BID  . nystatin  5 mL Oral QID  . pantoprazole  40 mg Oral Daily  . piperacillin-tazobactam (ZOSYN)  IV  3.375 g Intravenous 3 times per day  . pravastatin  20 mg  Oral QHS  . traZODone  50 mg Oral QHS  . vancomycin  1,000 mg Intravenous Q24H    Assessment/Plan Left buttocks abscess S/p I&D 06/25/15, Dr. Romie Levee AODM Acute renal failure UTI Hypertension  Asthma Candidal esophagitis Constipation Multiple medical allergies (11 recorded) Body mass index is 35.8 Antibiotics: Day 5 vancomycin and Zosyn DVT: Lovenox/SCD   Plan:  I would continue the wet to dry dressings, clean in shower BID.  We will see her back in the clinic in 2 weeks.  Follow up instruction, and appointment are in the AVS.    LOS: 4 days    Jake Fuhrmann 06/28/2015

## 2015-06-28 NOTE — Progress Notes (Signed)
Patient Demographics  Laurie [name], is a 60 y.o. female, DOB - 04-02-1956, ZOX:[id number]  Admit date - 06/24/2015   Admitting Physician Therisa Doyne, MD  Outpatient Primary MD for the patient is No primary care provider on file.  LOS - 4   Chief Complaint  Patient presents with  . Abscess       Admission HPI/Brief narrative: 60 year old female with history of diabetes mellitus, coronary artery disease, hypertension presents with left buttock abscess and cellulitis, seen by surgery, status post I&D on 3/18, Empirically on IV antibiotics for abscess and UTI, patient with worsening renal function during hospital stay, Thought to be secondary to contrast nephropathy versus ATN.  Subjective:   Laurie [name] today has, No headache, No chest pain,No Nausea, Still complaines of mild abdominal pain.  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 -  Agent started on IV vancomycin and Zosyn on admission and she is diabetic , and MRSA Green is positive  - Wound culture Gram stain showing  gram-positive cocci in pairs, should be covered with vancomycin. - Leukocytosis trending down  Acute renal failure - Patient with significant increase of her creatinine, was 0.84 on admission, today is 2.6, unclear if related to contrast-induced nephropathy, possible ATN given urine sodium of 56 secondary to infection,  will continue with IV fluids. - We'll obtain renal ultrasound hydronephrosis (patient has Laurie [name] that was built for interstitial cystitis in 1990 at [facility])  Diabetes mellitus - CBG better  controlled after starting on Lantus, will change sliding scale from resistant to moderate, will start on NovoLog 4 units 3 times a day before meals. - Patient can be resumed on her home insulin U5 100 on discharge.  UTI - Urine culture growing Escherichia coli, pansensitive., Change IV Zosyn to Rocephin.  Coronary artery disease - Denies any chest pain or shortness of breath, continue with home medication including aspirin, statin and beta blockers  Hypertension - Continue with home medication  Asthma - Appears to be stable, continue with home medication  Candidal esophagitis - Continue with nystatin swish and swallow  Complains of abdominal pain - KUB with no acute finding, patient reports this problem is chronic, recurrent - Post constipation, continue with laxatives   Code Status: Full  Family Communication: Discussed with patient and significant other at bedside  Disposition Plan: Home once stable, and renal function started to improve   Procedures  Incision and drainage of left buttock abscess PICC line 3/21  Consults   Gen. surgery   Medications  Scheduled Meds: . amLODipine  10 mg Oral Daily  . aspirin EC  81 mg Oral Daily  . cefTRIAXone (ROCEPHIN)  IV  1 g Intravenous Q24H  . Chlorhexidine Gluconate Cloth  6 each Topical Q0600  . enoxaparin (LOVENOX) injection  30 mg Subcutaneous QHS  . FLUoxetine  40 mg Oral Daily  . gabapentin  600 mg Oral  BID  . insulin aspart  0-20 Units Subcutaneous TID WC  . insulin aspart  0-5 Units Subcutaneous QHS  . insulin glargine  17 Units Subcutaneous BID  . isosorbide mononitrate  60 mg Oral Daily  . metoprolol succinate  100 mg Oral Daily  . mometasone-formoterol  2 puff Inhalation BID  . mupirocin ointment  1 application Nasal BID  . nystatin  5 mL Oral QID  . pantoprazole  40 mg Oral Daily  . pravastatin  20 mg Oral QHS  . traZODone  50 mg Oral QHS  . vancomycin  1,000 mg Intravenous Q24H   Continuous  Infusions: . sodium chloride 100 mL/hr (06/27/15 1432)   PRN Meds:.acetaminophen **OR** acetaminophen, albuterol, diphenhydrAMINE, HYDROmorphone (DILAUDID) injection, HYDROmorphone, menthol-cetylpyridinium, ondansetron **OR** ondansetron (ZOFRAN) IV, temazepam  DVT Prophylaxis  Lovenox   Lab Results  Component Value Date   PLT 429* 06/28/2015    Antibiotics    Anti-infectives    Start     Dose/Rate Route Frequency Ordered Stop   06/28/15 1645  cefTRIAXone (ROCEPHIN) 1 g in dextrose 5 % 50 mL IVPB     1 g 100 mL/hr over 30 Minutes Intravenous Every 24 hours 06/28/15 1643     06/27/15 2200  vancomycin (VANCOCIN) IVPB 1000 mg/200 mL premix     1,000 mg 200 mL/hr over 60 Minutes Intravenous Every 24 hours 06/27/15 2015     06/25/15 2200  vancomycin (VANCOCIN) 1,500 mg in sodium chloride 0.9 % 500 mL IVPB  Status:  Discontinued     1,500 mg 250 mL/hr over 120 Minutes Intravenous Every 24 hours 06/25/15 0026 06/27/15 1030   06/25/15 0030  vancomycin (VANCOCIN) 1,500 mg in sodium chloride 0.9 % 500 mL IVPB     1,500 mg 250 mL/hr over 120 Minutes Intravenous  Once 06/25/15 0025 06/25/15 0258   06/24/15 2345  piperacillin-tazobactam (ZOSYN) IVPB 3.375 g  Status:  Discontinued     3.375 g 12.5 mL/hr over 240 Minutes Intravenous 3 times per day 06/24/15 2332 06/28/15 1643   06/24/15 2200  ceFAZolin (ANCEF) IVPB 1 g/50 mL premix     1 g 100 mL/hr over 30 Minutes Intravenous  Once 06/24/15 2151 06/24/15 2250          Objective:   Filed Vitals:   06/28/15 0625 06/28/15 0910 06/28/15 0917 06/28/15 1400  BP: 130/76  136/80 126/74  Pulse: 78  66 74  Temp: 97.5 F (36.4 C)   97.9 F (36.6 C)  TempSrc: Oral   Oral  Resp: 18   18  Height:      Weight:      SpO2: 97% 100%  100%    Wt Readings from Last 3 Encounters:  06/25/15 94.666 kg (208 lb 11.2 oz)     Intake/Output Summary (Last 24 hours) at 06/28/15 1645 Last data filed at 06/28/15 1400  Gross per 24 hour  Intake     840 ml  Output    900 ml  Net    -60 ml     Physical Exam  Awake Alert, Oriented X 3, No new F.N deficits, Normal affect [place].AT,PERRAL Supple Neck,No JVD, No cervical lymphadenopathy appriciated.  Symmetrical Chest wall movement, Good air movement bilaterally, CTAB RRR,No Gallops,Rubs or new Murmurs, No Parasternal Heave +ve B.Sounds, Abd Soft, No tenderness, No organomegaly appriciated, No rebound - guarding or rigidity. No Cyanosis, Clubbing or edema, No new Rash or bruise  ,  Data Review   Micro Results Recent Results (from  the past 240 hour(s))  Urine culture     Status: None   Collection Time: 06/25/15 12:20 AM  Result Value Ref Range Status   Specimen Description URINE, CLEAN CATCH  Final   Special Requests NONE  Final   Culture   Final    >=100,000 COLONIES/mL ESCHERICHIA COLI Performed at [facility]    Report Status 06/27/2015 FINAL  Final   Organism ID, Bacteria ESCHERICHIA COLI  Final      Susceptibility   Escherichia coli - MIC*    AMPICILLIN <=2 SENSITIVE Sensitive     CEFAZOLIN <=4 SENSITIVE Sensitive     CEFTRIAXONE <=1 SENSITIVE Sensitive     CIPROFLOXACIN <=0.25 SENSITIVE Sensitive     GENTAMICIN <=1 SENSITIVE Sensitive     IMIPENEM <=0.25 SENSITIVE Sensitive     NITROFURANTOIN <=16 SENSITIVE Sensitive     TRIMETH/SULFA <=20 SENSITIVE Sensitive     AMPICILLIN/SULBACTAM <=2 SENSITIVE Sensitive     PIP/TAZO <=4 SENSITIVE Sensitive     * >=100,000 COLONIES/mL ESCHERICHIA COLI  Surgical pcr screen     Status: Abnormal   Collection Time: 06/25/15  9:10 AM  Result Value Ref Range Status   MRSA, PCR POSITIVE (A) NEGATIVE Final    Comment: RESULT CALLED TO, READ BACK BY AND VERIFIED WITH: P RAMIREZ AT 1100 ON 03.18.2017 BY NBROOKS    Staphylococcus aureus POSITIVE (A) NEGATIVE Final    Comment:        The Xpert SA Assay (FDA approved for NASAL specimens in patients over 51 years of age), is one component of a comprehensive  surveillance program.  Test performance has been validated by [facility] for patients greater than or equal to 61 year old. It is not intended to diagnose infection nor to guide or monitor treatment.   Anaerobic culture     Status: None (Preliminary result)   Collection Time: 06/25/15 11:25 AM  Result Value Ref Range Status   Specimen Description BUTTOCKS LEFT TISSUE  Final   Special Requests NONE  Final   Gram Stain   Final    ABUNDANT WBC PRESENT, PREDOMINANTLY PMN ABUNDANT GRAM POSITIVE COCCI IN PAIRS IN CLUSTERS Performed at [facility]    Culture   Final    NO ANAEROBES ISOLATED; CULTURE IN PROGRESS FOR 5 DAYS Performed at [facility]    Report Status PENDING  Incomplete  Tissue culture     Status: None (Preliminary result)   Collection Time: 06/25/15 11:25 AM  Result Value Ref Range Status   Specimen Description BUTTOCKS LEFT TISSUE  Final   Special Requests NONE  Final   Gram Stain   Final    ABUNDANT WBC PRESENT, PREDOMINANTLY PMN ABUNDANT GRAM POSITIVE COCCI IN PAIRS IN CLUSTERS Performed at [facility]    Culture   Final    Culture reincubated for better growth Performed at [facility]    Report Status PENDING  Incomplete    Radiology Reports Dg Chest 2 View  06/27/2015  CLINICAL DATA:  Patient reports cough, medicated hypertension and ex-smoker. EXAM: CHEST - 2 VIEW COMPARISON:  05/02/2007 FINDINGS: Lungs are clear. Heart size and mediastinal contours are within normal limits. No effusion. Visualized skeletal structures are unremarkable. IMPRESSION: No acute cardiopulmonary disease. Electronically Signed   By: Corlis Leak M.D.   On: 06/27/2015 16:05   Ct Pelvis W Contrast  06/24/2015  CLINICAL DATA:  Weeping portal at the left lower buttocks, near the rectum. Pain and swelling. Initial  encounter. EXAM: CT PELVIS WITH CONTRAST TECHNIQUE: Multidetector CT imaging of the pelvis was performed using the standard protocol  following the bolus administration of intravenous contrast. CONTRAST:  100mL OMNIPAQUE IOHEXOL 300 MG/ML  SOLN COMPARISON:  None. FINDINGS: Focal soft tissue inflammation is noted along the left buttock, tracking along the proximal left thigh. There is suggestion of an associated small area of fluid measuring 2.3 x 1.4 cm, though no well defined abscess is seen. This may simply reflect more focal soft tissue edema. The patient's urostomy is grossly unremarkable in appearance. Visualized small and large bowel loops are grossly unremarkable, aside from diverticulosis along the sigmoid colon. No acute osseous abnormalities are identified. Scattered vascular calcifications are seen. IMPRESSION: 1. Focal soft tissue inflammation along the left buttock, tracking along the proximal left thigh. Suggestion of associated small area of fluid measuring 2.3 x 1.4 cm, though no well defined abscess is seen. This may simply reflect more focal soft tissue edema. 2. Diverticulosis along the sigmoid colon. 3. Scattered vascular calcifications seen. Electronically Signed   By: Roanna RaiderJeffery  Chang M.D.   On: 06/24/2015 21:30   Dg Abd Portable 1v  06/26/2015  CLINICAL DATA:  Abdominal cramping for 1 week EXAM: PORTABLE ABDOMEN - 1 VIEW COMPARISON:  None. FINDINGS: Scattered large and small bowel gas is noted. Postsurgical changes are seen. No abnormal mass or abnormal calcifications are noted. No free air is seen. No acute bony abnormality is noted. IMPRESSION: No acute abnormality noted. Electronically Signed   By: Alcide CleverMark  Lukens M.D.   On: 06/26/2015 18:06     CBC  Recent Labs Lab 06/24/15 1839 06/25/15 0506 06/26/15 0535 06/27/15 0436 06/28/15 0444  WBC 16.8* 12.1* 14.4* 16.4* 12.5*  HGB 12.4 11.4* 11.1* 10.9* 10.9*  HCT 35.5* 34.6* 33.8* 34.1* 34.1*  PLT 412* 381 421* 439* 429*  MCV 91.5 95.8 92.3 97.7 98.6  MCH 32.0 31.6 30.3 31.2 31.5  MCHC 34.9 32.9 32.8 32.0 32.0  RDW 13.5 13.6 13.2 13.6 13.9  LYMPHSABS 3.1  --    --   --   --   MONOABS 1.5*  --   --   --   --   EOSABS 0.4  --   --   --   --   BASOSABS 0.0  --   --   --   --     Chemistries   Recent Labs Lab 06/24/15 1839 06/25/15 0506 06/26/15 0535 06/27/15 0436 06/28/15 0444  NA 141 139 137 142 142  K 3.7 3.3* 4.1 3.9 4.2  CL 103 105 106 108 108  CO2 27 25 24 24 25   GLUCOSE 92 91 174* 181* 168*  BUN 17 11 20  25* 28*  CREATININE 1.14* 0.84 1.61* 2.35* 2.62*  CALCIUM 9.6 8.5* 8.8* 8.7* 8.6*  MG  --  1.4*  --   --   --   AST 19 17  --   --   --   ALT 18 15  --   --   --   ALKPHOS 75 60  --   --   --   BILITOT 0.7 0.2*  --   --   --    ------------------------------------------------------------------------------------------------------------------ estimated creatinine clearance is 25.2 mL/min (by C-G formula based on Cr of 2.62). ------------------------------------------------------------------------------------------------------------------ No results for input(s): HGBA1C in the last 72 hours. ------------------------------------------------------------------------------------------------------------------ No results for input(s): CHOL, HDL, LDLCALC, TRIG, CHOLHDL, LDLDIRECT in the last 72 hours. ------------------------------------------------------------------------------------------------------------------ No results for input(s): TSH, T4TOTAL, T3FREE, THYROIDAB in  the last 72 hours.  Invalid input(s): FREET3 ------------------------------------------------------------------------------------------------------------------ No results for input(s): VITAMINB12, FOLATE, FERRITIN, TIBC, IRON, RETICCTPCT in the last 72 hours.  Coagulation profile No results for input(s): INR, PROTIME in the last 168 hours.  No results for input(s): DDIMER in the last 72 hours.  Cardiac Enzymes No results for input(s): CKMB, TROPONINI, MYOGLOBIN in the last 168 hours.  Invalid input(s):  CK ------------------------------------------------------------------------------------------------------------------ Invalid input(s): POCBNP     Time Spent in minutes   30 minutes   Kacy Conely M.D on 06/28/2015 at 4:45 PM  Between 7am to 7pm - Pager - [phone]  After 7pm go to [email or web address] - password [facility]  [facility]   Office  [phone]

## 2015-06-29 DIAGNOSIS — L03317 Cellulitis of buttock: Secondary | ICD-10-CM

## 2015-06-29 DIAGNOSIS — K610 Anal abscess: Secondary | ICD-10-CM

## 2015-06-29 DIAGNOSIS — E1122 Type 2 diabetes mellitus with diabetic chronic kidney disease: Secondary | ICD-10-CM

## 2015-06-29 DIAGNOSIS — Z794 Long term (current) use of insulin: Secondary | ICD-10-CM

## 2015-06-29 DIAGNOSIS — N390 Urinary tract infection, site not specified: Secondary | ICD-10-CM

## 2015-06-29 DIAGNOSIS — N182 Chronic kidney disease, stage 2 (mild): Secondary | ICD-10-CM

## 2015-06-29 LAB — CBC
HCT: 33.3 % — ABNORMAL LOW (ref 36.0–46.0)
Hemoglobin: 10.8 g/dL — ABNORMAL LOW (ref 12.0–15.0)
MCH: 31.5 pg (ref 26.0–34.0)
MCHC: 32.4 g/dL (ref 30.0–36.0)
MCV: 97.1 fL (ref 78.0–100.0)
PLATELETS: 423 10*3/uL — AB (ref 150–400)
RBC: 3.43 MIL/uL — ABNORMAL LOW (ref 3.87–5.11)
RDW: 13.8 % (ref 11.5–15.5)
WBC: 11 10*3/uL — AB (ref 4.0–10.5)

## 2015-06-29 LAB — GLUCOSE, CAPILLARY
GLUCOSE-CAPILLARY: 212 mg/dL — AB (ref 65–99)
Glucose-Capillary: 116 mg/dL — ABNORMAL HIGH (ref 65–99)
Glucose-Capillary: 195 mg/dL — ABNORMAL HIGH (ref 65–99)
Glucose-Capillary: 201 mg/dL — ABNORMAL HIGH (ref 65–99)

## 2015-06-29 LAB — BASIC METABOLIC PANEL
Anion gap: 7 (ref 5–15)
BUN: 26 mg/dL — AB (ref 6–20)
CHLORIDE: 109 mmol/L (ref 101–111)
CO2: 24 mmol/L (ref 22–32)
Calcium: 8.9 mg/dL (ref 8.9–10.3)
Creatinine, Ser: 2.14 mg/dL — ABNORMAL HIGH (ref 0.44–1.00)
GFR calc non Af Amer: 24 mL/min — ABNORMAL LOW (ref >60)
GFR, EST AFRICAN AMERICAN: 28 mL/min — AB (ref >60)
GLUCOSE: 154 mg/dL — AB (ref 65–99)
POTASSIUM: 4.2 mmol/L (ref 3.5–5.1)
Sodium: 140 mmol/L (ref 135–145)

## 2015-06-29 MED ORDER — GABAPENTIN 300 MG PO CAPS
300.0000 mg | ORAL_CAPSULE | Freq: Two times a day (BID) | ORAL | Status: DC
  Administered 2015-06-29 – 2015-07-01 (×5): 300 mg via ORAL
  Filled 2015-06-29 (×6): qty 1

## 2015-06-29 MED ORDER — ENOXAPARIN SODIUM 40 MG/0.4ML ~~LOC~~ SOLN
40.0000 mg | Freq: Every day | SUBCUTANEOUS | Status: DC
  Administered 2015-06-29 – 2015-06-30 (×2): 40 mg via SUBCUTANEOUS
  Filled 2015-06-29 (×3): qty 0.4

## 2015-06-29 NOTE — Progress Notes (Signed)
Pharmacy Antibiotic Note  Laurie [name] is a [age] y.o. female admitted on 06/24/2015 with cellulitis with large infected carbuncle of left perineum (s/p I&D 3/18) and UTI .  Pharmacy has been consulted for vancomycin dosing.  Vancomycin level 3/21 PM 14 (after 1 dose of 1gm 24hr after 1500mg  iv q24hr dosing.  SCr has increased 3/20 and 3/21, however vancomycin trough is below desired goal (15-20).  With dose and renal function changes, vancomycin level of 14 is not a steady state level.  Will increase the dose and slightly shorten dosing interval for next dose, but expect that more levels will be needed to monitor therapy.  Plan: Change vancomycin dosing to 1250mg  iv q24hr Will recheck level as needed.  Height: 5\' 4"  (162.6 cm) Weight: 208 lb 11.2 oz (94.666 kg) IBW/kg (Calculated) : 54.7  Temp (24hrs), Avg:97.7 F (36.5 C), Min:97.5 F (36.4 C), Max:97.9 F (36.6 C)   Recent Labs Lab 06/24/15 1839 06/24/15 1848 06/25/15 0506 06/26/15 0535 06/27/15 0436 06/27/15 1427 06/28/15 0444 06/28/15 2150  WBC 16.8*  --  12.1* 14.4* 16.4*  --  12.5*  --   CREATININE 1.14*  --  0.84 1.61* 2.35*  --  2.62*  --   LATICACIDVEN  --  0.84  --   --   --   --   --   --   VANCOTROUGH  --   --   --   --   --   --   --  14  VANCORANDOM  --   --   --   --   --  19  --   --     Estimated Creatinine Clearance: 25.2 mL/min (by C-G formula based on Cr of 2.62).    Allergies  Allergen Reactions  . Celebrex [Celecoxib] Itching  . Ciprofloxacin Other (See Comments)    No history of rash or SOB. Had kidney problems when she took cipro but was not told she had interstitial nephritis.  Marland Kitchen. Cymbalta [Duloxetine Hcl]     GI upset / constipation.   . Dilaudid [Hydromorphone Hcl] Itching  . Diuretic [Buchu-[place]sil[place]-Hydran] Other (See Comments)    Dehydration. Elevated creatini  . Fentanyl And Related Itching  . Glucophage [Metformin Hcl] Other (See Comments)    Stopped due to increase creat  .  Lyrica [Pregabalin] Other (See Comments)    CNS disorder. Headache  . Oxycodone Itching  . Morphine And Related Rash  . Sulfa Antibiotics Rash  . Vicodin [Hydrocodone-Acetaminophen] Rash    Antimicrobials this admission: Vanc 3/18 >> Zosyn 3/18 >>  Dose adjustments this admission: Vancomycin level 3/21 PM 14 (after 1 dose of 1gm 24hr after 1500mg  iv q24hr dosing.  Change vancomycin to 1250mg  iv q24hr  Microbiology results: 3/18 UCx: E.coli (pan-sensitive) 3/18 MRSA PCR: positive 3/18 Anaerobic/Tissue Cx: GPC in pairs/clusters on gram stain, cx reincubating. No anaerobes isolated.  Thank you for allowing pharmacy to be a part of this patient's care.  Aleene DavidsonGrimsley Jr, Sota Hetz Crowford 06/29/2015 12:30 AM

## 2015-06-29 NOTE — Care Management Note (Signed)
Case Management Note  Patient Details  Name: Laurie [name] MRN: [id number] Date of Birth: 04-10-1955  Subjective/Objective:    Admitted with Cellulitis of buttock                 Action/Plan: Discharge planning, spoke with patient at beside. [facility]. Patient states her brother lives with her and [facility], she is requesting the same nurse if possible. Patient also states her husband will be teachable caregiver to assist with wound care/dressing changes.    Expected Discharge Date:                  Expected Discharge Plan:  Home w Home Health Services  In-House Referral:  NA  Discharge planning Services  CM Consult  Post Acute Care Choice:  Home Health Choice offered to:  Patient  DME Arranged:  N/A DME Agency:  NA  HH Arranged:  RN HH Agency:  [facility]  Status of Service:  Completed, signed off  Medicare Important Message Given:  Yes Date Medicare IM Given:    Medicare IM give by:    Date Additional Medicare IM Given:    Additional Medicare Important Message give by:     If discussed at Long Length of Stay Meetings, dates discussed:    Additional Comments:  Alexis Goodelleele, Jovanni Rash K, RN 06/29/2015, 10:23 AM [phone]

## 2015-06-29 NOTE — Progress Notes (Signed)
Patient Demographics  Laurie [name], is a 60 y.o. female, DOB - 08-Sep-1955, UJW:[id number]  Admit date - 06/24/2015   Admitting Physician Therisa DoyneAnastassia Doutova, MD  Outpatient Primary MD for the patient is No primary care provider on file.  LOS - 5   Chief Complaint  Patient presents with  . Abscess       Admission HPI/Brief narrative: 60 year old female with history of diabetes mellitus, coronary artery disease, hypertension presents with left buttock abscess and cellulitis, seen by surgery, status post I&D on 3/18, Empirically on IV antibiotics for abscess and UTI, patient with worsening renal function during hospital stay, Thought to be secondary to contrast nephropathy versus ATN.  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 -  Treated with IV vanc-- d/c (spoke with surgery PA- wound clean) - Wound culture Gram stain showing  gram-positive cocci in pairs: monitor - Leukocytosis trending down  Acute renal failure - Patient with significant increase of her creatinine, was 0.84 on admission, peaked at 2.6, unclear if related to contrast-induced nephropathy, possible ATN given urine sodium of 56 secondary to infection,  will continue with IV fluids. - renal ultrasound negative for hydronephrosis (patient has [employer]le[employer] P[facility]) -repeat BMP in AM  Diabetes mellitus - CBG better controlled after starting on Lantus, will change sliding scale from resistant to moderate, will start on NovoLog 4  units 3 times a day before meals. - Patient can be resumed on her home insulin U5 100 on discharge.  UTI - Urine culture growing Escherichia coli, pansensitive., Change IV Zosyn to Rocephin- day #6  Coronary artery disease - Denies any chest pain or shortness of breath, continue with home medication including aspirin, statin and beta blockers  Hypertension - Continue with home medication  Asthma -albuterol neb  Candidal esophagitis - Continue with nystatin swish and swallow  Complains of abdominal pain - KUB with no acute finding, patient reports this problem is chronic, recurrent - Post constipation, continue with laxatives   Code Status: Full  Family Communication: Discussed with patient and significant other at bedside  Disposition Plan: Home once stable, and renal function started to improve   Procedures  Incision and drainage of left buttock abscess PICC line 3/21  Consults   Gen. surgery   Medications  Scheduled Meds: . amLODipine  10 mg Oral Daily  . aspirin EC  81 mg Oral Daily  . cefTRIAXone (ROCEPHIN)  IV  1 g Intravenous Q24H  . Chlorhexidine Gluconate Cloth  6 each Topical Q0600  . enoxaparin (LOVENOX) injection  30 mg Subcutaneous QHS  . FLUoxetine  40 mg Oral Daily  . gabapentin  300 mg Oral BID  . insulin aspart  0-15 Units Subcutaneous TID WC  . insulin aspart  0-5 Units Subcutaneous QHS  . insulin  aspart  4 Units Subcutaneous TID WC  . insulin glargine  17 Units Subcutaneous BID  . isosorbide mononitrate  60 mg Oral Daily  . metoprolol succinate  100 mg Oral Daily  . mometasone-formoterol  2 puff Inhalation BID  . mupirocin ointment  1 application Nasal BID  . nystatin  5 mL Oral QID  . pantoprazole  40 mg Oral Daily  . polyethylene glycol  17 g Oral Daily  . pravastatin  20 mg Oral QHS  . senna-docusate  1 tablet Oral BID  . traZODone  50 mg Oral QHS   Continuous Infusions: . sodium chloride 100 mL/hr at 06/29/15 0507   PRN  Meds:.acetaminophen **OR** acetaminophen, albuterol, diphenhydrAMINE, HYDROmorphone (DILAUDID) injection, HYDROmorphone, menthol-cetylpyridinium, ondansetron **OR** ondansetron (ZOFRAN) IV, sodium chloride flush, temazepam  DVT Prophylaxis  Lovenox   Lab Results  Component Value Date   PLT 423* 06/29/2015    Antibiotics    Anti-infectives    Start     Dose/Rate Route Frequency Ordered Stop   06/29/15 2000  vancomycin (VANCOCIN) 1,250 mg in sodium chloride 0.9 % 250 mL IVPB  Status:  Discontinued     1,250 mg 166.7 mL/hr over 90 Minutes Intravenous Every 24 hours 06/28/15 2322 06/29/15 1031   06/28/15 1700  cefTRIAXone (ROCEPHIN) 1 g in dextrose 5 % 50 mL IVPB     1 g 100 mL/hr over 30 Minutes Intravenous Every 24 hours 06/28/15 1643     06/27/15 2200  vancomycin (VANCOCIN) IVPB 1000 mg/200 mL premix  Status:  Discontinued     1,000 mg 200 mL/hr over 60 Minutes Intravenous Every 24 hours 06/27/15 2015 06/28/15 2322   06/25/15 2200  vancomycin (VANCOCIN) 1,500 mg in sodium chloride 0.9 % 500 mL IVPB  Status:  Discontinued     1,500 mg 250 mL/hr over 120 Minutes Intravenous Every 24 hours 06/25/15 0026 06/27/15 1030   06/25/15 0030  vancomycin (VANCOCIN) 1,500 mg in sodium chloride 0.9 % 500 mL IVPB     1,500 mg 250 mL/hr over 120 Minutes Intravenous  Once 06/25/15 0025 06/25/15 0258   06/24/15 2345  piperacillin-tazobactam (ZOSYN) IVPB 3.375 g  Status:  Discontinued     3.375 g 12.5 mL/hr over 240 Minutes Intravenous 3 times per day 06/24/15 2332 06/28/15 1643   06/24/15 2200  ceFAZolin (ANCEF) IVPB 1 g/50 mL premix     1 g 100 mL/hr over 30 Minutes Intravenous  Once 06/24/15 2151 06/24/15 2250          Objective:   Filed Vitals:   06/28/15 1838 06/28/15 2217 06/29/15 0625 06/29/15 0640  BP:  169/73 123/69   Pulse: 75 75 66   Temp:  97.8 F (36.6 C) 97.8 F (36.6 C)   TempSrc:  Oral Oral   Resp: Height:      Weight:      SpO2: 100% 100% 100% 98%    Wt  Readings from Last 3 Encounters:  06/25/15 94.666 kg (208 lb 11.2 oz)     Intake/Output Summary (Last 24 hours) at 06/29/15 1032 Last data filed at 06/29/15 0626  Gross per 24 hour  Intake    460 ml  Output   2600 ml  Net  -2140 ml     Physical Exam  Awake Alert, Oriented X 3, No new F.N deficits, Normal affect [place].AT,PERRAL Supple Neck,No JVD, No cervical lymphadenopathy appriciated.  Symmetrical Chest wall movement, Good air movement bilaterally, CTAB RRR,No Gallops,Rubs or new  Murmurs, No Parasternal Heave +ve B.Sounds, Abd Soft, No tenderness, No organomegaly appriciated, No rebound - guarding or rigidity. No Cyanosis, Clubbing or edema, No new Rash or bruise  ,  Data Review   Micro Results Recent Results (from the past 240 hour(s))  Urine culture     Status: None   Collection Time: 06/25/15 12:20 AM  Result Value Ref Range Status   Specimen Description URINE, CLEAN CATCH  Final   Special Requests NONE  Final   Culture   Final    >=100,000 COLONIES/mL ESCHERICHIA COLI Performed at [facility]    Report Status 06/27/2015 FINAL  Final   Organism ID, Bacteria ESCHERICHIA COLI  Final      Susceptibility   Escherichia coli - MIC*    AMPICILLIN <=2 SENSITIVE Sensitive     CEFAZOLIN <=4 SENSITIVE Sensitive     CEFTRIAXONE <=1 SENSITIVE Sensitive     CIPROFLOXACIN <=0.25 SENSITIVE Sensitive     GENTAMICIN <=1 SENSITIVE Sensitive     IMIPENEM <=0.25 SENSITIVE Sensitive     NITROFURANTOIN <=16 SENSITIVE Sensitive     TRIMETH/SULFA <=20 SENSITIVE Sensitive     AMPICILLIN/SULBACTAM <=2 SENSITIVE Sensitive     PIP/TAZO <=4 SENSITIVE Sensitive     * >=100,000 COLONIES/mL ESCHERICHIA COLI  Surgical pcr screen     Status: Abnormal   Collection Time: 06/25/15  9:10 AM  Result Value Ref Range Status   MRSA, PCR POSITIVE (A) NEGATIVE Final    Comment: RESULT CALLED TO, READ BACK BY AND VERIFIED WITH: P RAMIREZ AT 1100 ON 03.18.2017 BY NBROOKS    Staphylococcus  aureus POSITIVE (A) NEGATIVE Final    Comment:        The Xpert SA Assay (FDA approved for NASAL specimens in patients over 22 years of age), is one component of a comprehensive surveillance program.  Test performance has been validated by [facility] for patients greater than or equal to 30 year old. It is not intended to diagnose infection nor to guide or monitor treatment.   Anaerobic culture     Status: None (Preliminary result)   Collection Time: 06/25/15 11:25 AM  Result Value Ref Range Status   Specimen Description BUTTOCKS LEFT TISSUE  Final   Special Requests NONE  Final   Gram Stain   Final    ABUNDANT WBC PRESENT, PREDOMINANTLY PMN ABUNDANT GRAM POSITIVE COCCI IN PAIRS IN CLUSTERS Performed at [facility]    Culture   Final    NO ANAEROBES ISOLATED; CULTURE IN PROGRESS FOR 5 DAYS Performed at [facility]    Report Status PENDING  Incomplete  Tissue culture     Status: None (Preliminary result)   Collection Time: 06/25/15 11:25 AM  Result Value Ref Range Status   Specimen Description BUTTOCKS LEFT TISSUE  Final   Special Requests NONE  Final   Gram Stain   Final    ABUNDANT WBC PRESENT, PREDOMINANTLY PMN ABUNDANT GRAM POSITIVE COCCI IN PAIRS IN CLUSTERS Performed at [facility]    Culture   Final    Culture reincubated for better growth Performed at [facility]    Report Status PENDING  Incomplete    Radiology Reports Dg Chest 2 View  06/27/2015  CLINICAL DATA:  Patient reports cough, medicated hypertension and ex-smoker. EXAM: CHEST - 2 VIEW COMPARISON:  05/02/2007 FINDINGS: Lungs are clear. Heart size and mediastinal contours are within normal limits. No effusion. Visualized skeletal structures are unremarkable. IMPRESSION: No acute cardiopulmonary  disease. Electronically Signed   By: Corlis Leak M.D.   On: 06/27/2015 16:05   Ct Pelvis W Contrast  06/24/2015  CLINICAL DATA:  Weeping portal at the left lower buttocks,  near the rectum. Pain and swelling. Initial encounter. EXAM: CT PELVIS WITH CONTRAST TECHNIQUE: Multidetector CT imaging of the pelvis was performed using the standard protocol following the bolus administration of intravenous contrast. CONTRAST:  OMNIPAQUE IOHEXOL 300 MG/ML  SOLN COMPARISON:  None. FINDINGS: Focal soft tissue inflammation is noted along the left buttock, tracking along the proximal left thigh. There is suggestion of an associated small area of fluid measuring 2.3 x 1.4 cm, though no well defined abscess is seen. This may simply reflect more focal soft tissue edema. The patient's urostomy is grossly unremarkable in appearance. Visualized small and large bowel loops are grossly unremarkable, aside from diverticulosis along the sigmoid colon. No acute osseous abnormalities are identified. Scattered vascular calcifications are seen. IMPRESSION: 1. Focal soft tissue inflammation along the left buttock, tracking along the proximal left thigh. Suggestion of associated small area of fluid measuring 2.3 x 1.4 cm, though no well defined abscess is seen. This may simply reflect more focal soft tissue edema. 2. Diverticulosis along the sigmoid colon. 3. Scattered vascular calcifications seen. Electronically Signed   By: Roanna Raider M.D.   On: 06/24/2015 21:30   US Renal  06/28/2015  CLINICAL DATA:  Acute renal failure. Elevated creatinine. History of interstitial cystitis in the bladder and status post cystectomy. EXAM: RENAL / URINARY TRACT ULTRASOUND COMPLETE COMPARISON:  CT scan 12/28/2014 FINDINGS: Right Kidney: Length: 10.2 cm. Normal renal cortical thickness and echogenicity without focal lesions or hydronephrosis. Left Kidney: Length: 11.5 cm. Normal renal cortical thickness and echogenicity without focal lesions or hydronephrosis. Bladder: Surgically absent. IMPRESSION: Normal sonographic appearance of both kidneys.  No hydronephrosis. Status post cystectomy. Electronically Signed   By: Rudie Meyer M.D.   On: 06/28/2015 20:49   Dg Abd Portable 1v  06/26/2015  CLINICAL DATA:  Abdominal cramping for 1 week EXAM: PORTABLE ABDOMEN - 1 VIEW COMPARISON:  None. FINDINGS: Scattered large and small bowel gas is noted. Postsurgical changes are seen. No abnormal mass or abnormal calcifications are noted. No free air is seen. No acute bony abnormality is noted. IMPRESSION: No acute abnormality noted. Electronically Signed   By: Alcide Clever M.D.   On: 06/26/2015 18:06     CBC  Recent Labs Lab 06/24/15 1839 06/25/15 0506 06/26/15 0535 06/27/15 0436 06/28/15 0444 06/29/15 0518  WBC 16.8* 12.1* 14.4* 16.4* 12.5* 11.0*  HGB 12.4 11.4* 11.1* 10.9* 10.9* 10.8*  HCT 35.5* 34.6* 33.8* 34.1* 34.1* 33.3*  PLT 412* 381 421* 439* 429* 423*  MCV 91.5 95.8 92.3 97.7 98.6 97.1  MCH 32.0 31.6 30.3 31.2 31.5 31.5  MCHC 34.9 32.9 32.8 32.0 32.0 32.4  RDW 13.5 13.6 13.2 13.6 13.9 13.8  LYMPHSABS 3.1  --   --   --   --   --   MONOABS 1.5*  --   --   --   --   --   EOSABS 0.4  --   --   --   --   --   BASOSABS 0.0  --   --   --   --   --     Chemistries   Recent Labs Lab 06/24/15 1839 06/25/15 0506 06/26/15 0535 06/27/15 0436 06/28/15 0444 06/29/15 0518  NA 141 139 137 142 142 140  K 3.7  3.3* 4.1 3.9 4.2 4.2  CL 103 105 106 108 108 109  CO2 GLUCOSE 92 91 174* 181* 168* 154*  BUN 25* 28* 26*  CREATININE 1.14* 0.84 1.61* 2.35* 2.62* 2.14*  CALCIUM 9.6 8.5* 8.8* 8.7* 8.6* 8.9  MG  --  1.4*  --   --   --   --   AST 19 17  --   --   --   --   ALT 18 15  --   --   --   --   ALKPHOS 75 60  --   --   --   --   BILITOT 0.7 0.2*  --   --   --   --    ------------------------------------------------------------------------------------------------------------------ estimated creatinine clearance is 30.8 mL/min (by C-G formula based on Cr of  2.14). ------------------------------------------------------------------------------------------------------------------ No results for input(s): HGBA1C in the last 72 hours. ------------------------------------------------------------------------------------------------------------------ No results for input(s): CHOL, HDL, LDLCALC, TRIG, CHOLHDL, LDLDIRECT in the last 72 hours. ------------------------------------------------------------------------------------------------------------------ No results for input(s): TSH, T4TOTAL, T3FREE, THYROIDAB in the last 72 hours.  Invalid input(s): FREET3 ------------------------------------------------------------------------------------------------------------------ No results for input(s): VITAMINB12, FOLATE, FERRITIN, TIBC, IRON, RETICCTPCT in the last 72 hours.  Coagulation profile No results for input(s): INR, PROTIME in the last 168 hours.  No results for input(s): DDIMER in the last 72 hours.  Cardiac Enzymes No results for input(s): CKMB, TROPONINI, MYOGLOBIN in the last 168 hours.  Invalid input(s): CK ------------------------------------------------------------------------------------------------------------------ Invalid input(s): POCBNP     Time Spent in minutes   30 minutes   Annaleese Guier U Jashayla Glatfelter DO on 06/29/2015 at 10:32 AM  [phone]  After 7pm go to [email or web address] - password [email or web address]  [facility]   Office  [phone]

## 2015-06-30 DIAGNOSIS — B3781 Candidal esophagitis: Secondary | ICD-10-CM

## 2015-06-30 DIAGNOSIS — I10 Essential (primary) hypertension: Secondary | ICD-10-CM

## 2015-06-30 LAB — BASIC METABOLIC PANEL
Anion gap: 7 (ref 5–15)
BUN: 21 mg/dL — AB (ref 6–20)
CALCIUM: 9.2 mg/dL (ref 8.9–10.3)
CO2: 25 mmol/L (ref 22–32)
Chloride: 108 mmol/L (ref 101–111)
Creatinine, Ser: 1.94 mg/dL — ABNORMAL HIGH (ref 0.44–1.00)
GFR calc Af Amer: 31 mL/min — ABNORMAL LOW (ref >60)
GFR, EST NON AFRICAN AMERICAN: 27 mL/min — AB (ref >60)
GLUCOSE: 254 mg/dL — AB (ref 65–99)
POTASSIUM: 3.8 mmol/L (ref 3.5–5.1)
Sodium: 140 mmol/L (ref 135–145)

## 2015-06-30 LAB — TISSUE CULTURE

## 2015-06-30 LAB — GLUCOSE, CAPILLARY
GLUCOSE-CAPILLARY: 313 mg/dL — AB (ref 65–99)
GLUCOSE-CAPILLARY: 169 mg/dL — AB (ref 65–99)
GLUCOSE-CAPILLARY: 124 mg/dL — AB (ref 65–99)
Glucose-Capillary: 183 mg/dL — ABNORMAL HIGH (ref 65–99)
Glucose-Capillary: 320 mg/dL — ABNORMAL HIGH (ref 65–99)
Glucose-Capillary: 188 mg/dL — ABNORMAL HIGH (ref 65–99)

## 2015-06-30 LAB — CBC
HCT: 32.3 % — ABNORMAL LOW (ref 36.0–46.0)
Hemoglobin: 11 g/dL — ABNORMAL LOW (ref 12.0–15.0)
MCH: 31.3 pg (ref 26.0–34.0)
MCHC: 34.1 g/dL (ref 30.0–36.0)
MCV: 92 fL (ref 78.0–100.0)
PLATELETS: 422 10*3/uL — AB (ref 150–400)
RBC: 3.51 MIL/uL — ABNORMAL LOW (ref 3.87–5.11)
RDW: 13.6 % (ref 11.5–15.5)
WBC: 13.5 10*3/uL — ABNORMAL HIGH (ref 4.0–10.5)

## 2015-06-30 MED ORDER — OXYCODONE HCL 5 MG PO TABS
5.0000 mg | ORAL_TABLET | Freq: Four times a day (QID) | ORAL | Status: DC | PRN
  Administered 2015-06-30 – 2015-07-01 (×3): 5 mg via ORAL
  Filled 2015-06-30 (×3): qty 1

## 2015-06-30 MED ORDER — LACTULOSE 10 GM/15ML PO SOLN
20.0000 g | Freq: Two times a day (BID) | ORAL | Status: DC | PRN
  Filled 2015-06-30: qty 30

## 2015-06-30 MED ORDER — LEVOFLOXACIN 750 MG PO TABS
750.0000 mg | ORAL_TABLET | ORAL | Status: DC
  Administered 2015-06-30: 750 mg via ORAL
  Filled 2015-06-30: qty 1

## 2015-06-30 NOTE — Progress Notes (Signed)
Patient Demographics  Laurie [name], is a 60 y.o. female, DOB - 04-May-1955, QIO:[id number]  Admit date - 06/24/2015   Admitting Physician Therisa Doyne, MD  Outpatient Primary MD for the patient is No primary care provider on file.  LOS - 6   Chief Complaint  Patient presents with  . Abscess       Admission HPI/Brief narrative: 60 year old female with history of diabetes mellitus, coronary artery disease, hypertension presents with left buttock abscess and cellulitis, seen by surgery, status post I&D on 3/18, Empirically on IV antibiotics for abscess and UTI, patient with worsening renal function during hospital stay, Thought to be secondary to contrast nephropathy versus ATN.  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 -  Treated with IV vanc-- d/c (spoke with surgery PA- wound clean) - Wound culture Gram stain showing  gram-positive cocci in pairs: MRSA -change to PO levaquin for cellulitis and UTI  Acute renal failure - Patient with significant increase of her creatinine, was 0.84 on admission, peaked at 2.6, unclear if related to contrast-induced nephropathy, possible ATN given urine sodium of 56 secondary to infection,  will continue with IV fluids. - renal ultrasound negative for hydronephrosis (patient has [name] that was built for interstitial cystitis in 1990 at [facility]) -repeat BMP in AM  Diabetes mellitus - CBG better controlled after starting on Lantus, will change sliding scale from resistant to moderate, will  start on NovoLog 4 units 3 times a day before meals. - Patient can be resumed on her home insulin U5 100 on discharge.  UTI - Urine culture growing Escherichia coli, pansensitive., Change IV Zosyn to Rocephin- day #6 and now levaquin  Coronary artery disease - Denies any chest pain or shortness of breath, continue with home medication including aspirin, statin and beta blockers  Hypertension - Continue with home medication  Asthma -albuterol neb  Candidal esophagitis - Continue with nystatin swish and swallow  Complains of abdominal pain - KUB with no acute finding, patient reports this problem is chronic, recurrent - Post constipation, continue with laxatives   Code Status: Full  Family Communication: Discussed with patient and significant other at bedside  Disposition Plan: Home once stable, and renal function started to improve   Procedures  Incision and drainage of left buttock abscess PICC line 3/21  Consults   Gen. surgery   Medications  Scheduled Meds: . amLODipine  10 mg Oral Daily  . aspirin EC  81 mg Oral Daily  . enoxaparin (LOVENOX) injection  40 mg Subcutaneous QHS  . FLUoxetine  40 mg Oral Daily  . gabapentin  300 mg Oral BID  . insulin aspart  0-15 Units Subcutaneous TID WC  . insulin aspart  0-5 Units Subcutaneous QHS  . insulin aspart  4 Units Subcutaneous TID WC  . insulin glargine  17  Units Subcutaneous BID  . isosorbide mononitrate  60 mg Oral Daily  . levofloxacin  750 mg Oral Q48H  . metoprolol succinate  100 mg Oral Daily  . mometasone-formoterol  2 puff Inhalation BID  . nystatin  5 mL Oral QID  . pantoprazole  40 mg Oral Daily  . polyethylene glycol  17 g Oral Daily  . pravastatin  20 mg Oral QHS  . senna-docusate  1 tablet Oral BID  . traZODone  50 mg Oral QHS   Continuous Infusions: . sodium chloride 100 mL/hr at 06/29/15 0507   PRN Meds:.acetaminophen **OR** acetaminophen, albuterol, diphenhydrAMINE, lactulose,  menthol-cetylpyridinium, ondansetron **OR** ondansetron (ZOFRAN) IV, oxyCODONE, sodium chloride flush, temazepam  DVT Prophylaxis  Lovenox   Lab Results  Component Value Date   PLT 422* 06/30/2015    Antibiotics    Anti-infectives    Start     Dose/Rate Route Frequency Ordered Stop   06/30/15 1200  levofloxacin (LEVAQUIN) tablet 750 mg     750 mg Oral Every 48 hours 06/30/15 0956     06/29/15 2000  vancomycin (VANCOCIN) 1,250 mg in sodium chloride 0.9 % 250 mL IVPB  Status:  Discontinued     1,250 mg 166.7 mL/hr over 90 Minutes Intravenous Every 24 hours 06/28/15 2322 06/29/15 1031   06/28/15 1700  cefTRIAXone (ROCEPHIN) 1 g in dextrose 5 % 50 mL IVPB  Status:  Discontinued     1 g 100 mL/hr over 30 Minutes Intravenous Every 24 hours 06/28/15 1643 06/30/15 0956   06/27/15 2200  vancomycin (VANCOCIN) IVPB 1000 mg/200 mL premix  Status:  Discontinued     1,000 mg 200 mL/hr over 60 Minutes Intravenous Every 24 hours 06/27/15 2015 06/28/15 2322   06/25/15 2200  vancomycin (VANCOCIN) 1,500 mg in sodium chloride 0.9 % 500 mL IVPB  Status:  Discontinued     1,500 mg 250 mL/hr over 120 Minutes Intravenous Every 24 hours 06/25/15 0026 06/27/15 1030   06/25/15 0030  vancomycin (VANCOCIN) 1,500 mg in sodium chloride 0.9 % 500 mL IVPB     1,500 mg 250 mL/hr over 120 Minutes Intravenous  Once 06/25/15 0025 06/25/15 0258   06/24/15 2345  piperacillin-tazobactam (ZOSYN) IVPB 3.375 g  Status:  Discontinued     3.375 g 12.5 mL/hr over 240 Minutes Intravenous 3 times per day 06/24/15 2332 06/28/15 1643   06/24/15 2200  ceFAZolin (ANCEF) IVPB 1 g/50 mL premix     1 g 100 mL/hr over 30 Minutes Intravenous  Once 06/24/15 2151 06/24/15 2250          Objective:   Filed Vitals:   06/29/15 2102 06/29/15 2109 06/30/15 0550 06/30/15 1113  BP:  163/76 147/67 138/72  Pulse:  76 80 78  Temp:  97.8 F (36.6 C) 98.6 F (37 C)   TempSrc:  Oral Oral   Resp:  18 18   Height:      Weight:        SpO2: 100% 100% 100%     Wt Readings from Last 3 Encounters:  06/25/15 94.666 kg (208 lb 11.2 oz)     Intake/Output Summary (Last 24 hours) at 06/30/15 1158 Last data filed at 06/30/15 0954  Gross per 24 hour  Intake   1690 ml  Output   3575 ml  Net  -1885 ml     Physical Exam  Awake Alert, Oriented X 3, No new F.N deficits, Normal affect Symmetrical Chest wall movement, Good air movement bilaterally, CTAB RRR,No  Gallops,Rubs or new Murmurs, No Parasternal Heave +ve B.Sounds, Abd Soft, No tenderness, No organomegaly appriciated, No rebound - guarding or rigidity.   Data Review   Micro Results Recent Results (from the past 240 hour(s))  Urine culture     Status: None   Collection Time: 06/25/15 12:20 AM  Result Value Ref Range Status   Specimen Description URINE, CLEAN CATCH  Final   Special Requests NONE  Final   Culture   Final    >=100,000 COLONIES/mL ESCHERICHIA COLI Performed at [facility]    Report Status 06/27/2015 FINAL  Final   Organism ID, Bacteria ESCHERICHIA COLI  Final      Susceptibility   Escherichia coli - MIC*    AMPICILLIN <=2 SENSITIVE Sensitive     CEFAZOLIN <=4 SENSITIVE Sensitive     CEFTRIAXONE <=1 SENSITIVE Sensitive     CIPROFLOXACIN <=0.25 SENSITIVE Sensitive     GENTAMICIN <=1 SENSITIVE Sensitive     IMIPENEM <=0.25 SENSITIVE Sensitive     NITROFURANTOIN <=16 SENSITIVE Sensitive     TRIMETH/SULFA <=20 SENSITIVE Sensitive     AMPICILLIN/SULBACTAM <=2 SENSITIVE Sensitive     PIP/TAZO <=4 SENSITIVE Sensitive     * >=100,000 COLONIES/mL ESCHERICHIA COLI  Surgical pcr screen     Status: Abnormal   Collection Time: 06/25/15  9:10 AM  Result Value Ref Range Status   MRSA, PCR POSITIVE (A) NEGATIVE Final    Comment: RESULT CALLED TO, READ BACK BY AND VERIFIED WITH: P RAMIREZ AT 1100 ON 03.18.2017 BY NBROOKS    Staphylococcus aureus POSITIVE (A) NEGATIVE Final    Comment:        The Xpert SA Assay (FDA approved for NASAL  specimens in patients over 3 years of age), is one component of a comprehensive surveillance program.  Test performance has been validated by [facility] for patients greater than or equal to 68 year old. It is not intended to diagnose infection nor to guide or monitor treatment.   Anaerobic culture     Status: None (Preliminary result)   Collection Time: 06/25/15 11:25 AM  Result Value Ref Range Status   Specimen Description BUTTOCKS LEFT TISSUE  Final   Special Requests NONE  Final   Gram Stain   Final    ABUNDANT WBC PRESENT, PREDOMINANTLY PMN ABUNDANT GRAM POSITIVE COCCI IN PAIRS IN CLUSTERS Performed at [facility]    Culture   Final    NO ANAEROBES ISOLATED; CULTURE IN PROGRESS FOR 5 DAYS Performed at [facility]    Report Status PENDING  Incomplete  Tissue culture     Status: None   Collection Time: 06/25/15 11:25 AM  Result Value Ref Range Status   Specimen Description BUTTOCKS LEFT TISSUE  Final   Special Requests NONE  Final   Gram Stain   Final    ABUNDANT WBC PRESENT, PREDOMINANTLY PMN ABUNDANT GRAM POSITIVE COCCI IN PAIRS IN CLUSTERS Performed at [facility]    Culture   Final    ABUNDANT METHICILLIN RESISTANT STAPHYLOCOCCUS AUREUS Note: RIFAMPIN AND GENTAMICIN SHOULD NOT BE USED AS SINGLE DRUGS FOR TREATMENT OF STAPH INFECTIONS. This organism DOES NOT demonstrate inducible Clindamycin resistance in vitro. CRITICAL RESULT CALLED TO, READ BACK BY AND VERIFIED WITH: PAULA A. AT  9:10AM ON 06/30/2015 HAJAM Performed at [facility]    Report Status 06/30/2015 FINAL  Final   Organism ID, Bacteria METHICILLIN RESISTANT STAPHYLOCOCCUS AUREUS  Final      Susceptibility  Methicillin resistant staphylococcus aureus - MIC*    CLINDAMYCIN <=0.25 SENSITIVE Sensitive     ERYTHROMYCIN >=8 RESISTANT Resistant     GENTAMICIN <=0.5 SENSITIVE Sensitive     LEVOFLOXACIN 0.25 SENSITIVE Sensitive     OXACILLIN >=4 RESISTANT Resistant       RIFAMPIN <=0.5 SENSITIVE Sensitive     TRIMETH/SULFA <=10 SENSITIVE Sensitive     VANCOMYCIN <=0.5 SENSITIVE Sensitive     TETRACYCLINE <=1 SENSITIVE Sensitive     * ABUNDANT METHICILLIN RESISTANT STAPHYLOCOCCUS AUREUS    Radiology Reports Dg Chest 2 View  06/27/2015  CLINICAL DATA:  Patient reports cough, medicated hypertension and ex-smoker. EXAM: CHEST - 2 VIEW COMPARISON:  05/02/2007 FINDINGS: Lungs are clear. Heart size and mediastinal contours are within normal limits. No effusion. Visualized skeletal structures are unremarkable. IMPRESSION: No acute cardiopulmonary disease. Electronically Signed   By: Corlis Leak M.D.   On: 06/27/2015 16:05   Ct Pelvis W Contrast  06/24/2015  CLINICAL DATA:  Weeping portal at the left lower buttocks, near the rectum. Pain and swelling. Initial encounter. EXAM: CT PELVIS WITH CONTRAST TECHNIQUE: Multidetector CT imaging of the pelvis was performed using the standard protocol following the bolus administration of intravenous contrast. CONTRAST:  OMNIPAQUE IOHEXOL 300 MG/ML  SOLN COMPARISON:  None. FINDINGS: Focal soft tissue inflammation is noted along the left buttock, tracking along the proximal left thigh. There is suggestion of an associated small area of fluid measuring 2.3 x 1.4 cm, though no well defined abscess is seen. This may simply reflect more focal soft tissue edema. The patient's urostomy is grossly unremarkable in appearance. Visualized small and large bowel loops are grossly unremarkable, aside from diverticulosis along the sigmoid colon. No acute osseous abnormalities are identified. Scattered vascular calcifications are seen. IMPRESSION: 1. Focal soft tissue inflammation along the left buttock, tracking along the proximal left thigh. Suggestion of associated small area of fluid measuring 2.3 x 1.4 cm, though no well defined abscess is seen. This may simply reflect more focal soft tissue edema. 2. Diverticulosis along the sigmoid colon. 3.  Scattered vascular calcifications seen. Electronically Signed   By: Roanna Raider M.D.   On: 06/24/2015 21:30   US Renal  06/28/2015  CLINICAL DATA:  Acute renal failure. Elevated creatinine. History of interstitial cystitis in the bladder and status post cystectomy. EXAM: RENAL / URINARY TRACT ULTRASOUND COMPLETE COMPARISON:  CT scan 12/28/2014 FINDINGS: Right Kidney: Length: 10.2 cm. Normal renal cortical thickness and echogenicity without focal lesions or hydronephrosis. Left Kidney: Length: 11.5 cm. Normal renal cortical thickness and echogenicity without focal lesions or hydronephrosis. Bladder: Surgically absent. IMPRESSION: Normal sonographic appearance of both kidneys.  No hydronephrosis. Status post cystectomy. Electronically Signed   By: Rudie Meyer M.D.   On: 06/28/2015 20:49   Dg Abd Portable 1v  06/26/2015  CLINICAL DATA:  Abdominal cramping for 1 week EXAM: PORTABLE ABDOMEN - 1 VIEW COMPARISON:  None. FINDINGS: Scattered large and small bowel gas is noted. Postsurgical changes are seen. No abnormal mass or abnormal calcifications are noted. No free air is seen. No acute bony abnormality is noted. IMPRESSION: No acute abnormality noted. Electronically Signed   By: Alcide Clever M.D.   On: 06/26/2015 18:06     CBC  Recent Labs Lab 06/24/15 1839  06/26/15 0535 06/27/15 0436 06/28/15 0444 06/29/15 0518 06/30/15 0425  WBC 16.8*  < > 14.4* 16.4* 12.5* 11.0* 13.5*  HGB 12.4  < > 11.1* 10.9* 10.9* 10.8* 11.0*  HCT 35.5*  < >  33.8* 34.1* 34.1* 33.3* 32.3*  PLT 412*  < > 421* 439* 429* 423* 422*  MCV 91.5  < > 92.3 97.7 98.6 97.1 92.0  MCH 32.0  < > 30.3 31.2 31.5 31.5 31.3  MCHC 34.9  < > 32.8 32.0 32.0 32.4 34.1  RDW 13.5  < > 13.2 13.6 13.9 13.8 13.6  LYMPHSABS 3.1  --   --   --   --   --   --   MONOABS 1.5*  --   --   --   --   --   --   EOSABS 0.4  --   --   --   --   --   --   BASOSABS 0.0  --   --   --   --   --   --   < > = values in this interval not  displayed.  Chemistries   Recent Labs Lab 06/24/15 1839 06/25/15 0506 06/26/15 0535 06/27/15 0436 06/28/15 0444 06/29/15 0518 06/30/15 0425  NA 141 139 137 142 142 140 140  K 3.7 3.3* 4.1 3.9 4.2 4.2 3.8  CL 103 105 106 108 108 109 108  CO2 27 25 24 24 25 24 25   GLUCOSE 92 91 174* 181* 168* 154* 254*  BUN 17 11 20  25* 28* 26* 21*  CREATININE 1.14* 0.84 1.61* 2.35* 2.62* 2.14* 1.94*  CALCIUM 9.6 8.5* 8.8* 8.7* 8.6* 8.9 9.2  MG  --  1.4*  --   --   --   --   --   AST 19 17  --   --   --   --   --   ALT 18 15  --   --   --   --   --   ALKPHOS 75 60  --   --   --   --   --   BILITOT 0.7 0.2*  --   --   --   --   --    ------------------------------------------------------------------------------------------------------------------ estimated creatinine clearance is 34 mL/min (by C-G formula based on Cr of 1.94). ------------------------------------------------------------------------------------------------------------------ No results for input(s): HGBA1C in the last 72 hours. ------------------------------------------------------------------------------------------------------------------ No results for input(s): CHOL, HDL, LDLCALC, TRIG, CHOLHDL, LDLDIRECT in the last 72 hours. ------------------------------------------------------------------------------------------------------------------ No results for input(s): TSH, T4TOTAL, T3FREE, THYROIDAB in the last 72 hours.  Invalid input(s): FREET3 ------------------------------------------------------------------------------------------------------------------ No results for input(s): VITAMINB12, FOLATE, FERRITIN, TIBC, IRON, RETICCTPCT in the last 72 hours.  Coagulation profile No results for input(s): INR, PROTIME in the last 168 hours.  No results for input(s): DDIMER in the last 72 hours.  Cardiac Enzymes No results for input(s): CKMB, TROPONINI, MYOGLOBIN in the last 168 hours.  Invalid input(s):  CK ------------------------------------------------------------------------------------------------------------------ Invalid input(s): POCBNP     Time Spent in minutes   25 minutes   Tyshon Fanning U Jamyah Folk DO on 06/30/2015 at 11:58 AM  [phone]  After 7pm go to [email or web address] - password [email or web address]  [facility]   Office  [phone]

## 2015-06-30 NOTE — Progress Notes (Signed)
CRITICAL VALUE ALERT  Critical value received:  Wound culture MRSA positive-                       [facility]  Date of notification:  06/30/15  Time of notification:  0914  Critical value read back:Yes.    Nurse who received alert: Alda BertholdPaula Alera Quevedo, RN  MD notified (1st page):  Dr. Benjamine MolaVann  Time of first page:  [phone]  MD notified (2nd page):  Time of second page:  Responding MD:  Dr. Benjamine MolaVann  Time MD responded: 1155- Md made rounds to unit.

## 2015-07-01 DIAGNOSIS — N179 Acute kidney failure, unspecified: Secondary | ICD-10-CM

## 2015-07-01 LAB — ANAEROBIC CULTURE

## 2015-07-01 LAB — BASIC METABOLIC PANEL
Anion gap: 9 (ref 5–15)
BUN: 19 mg/dL (ref 6–20)
CALCIUM: 9 mg/dL (ref 8.9–10.3)
CHLORIDE: 109 mmol/L (ref 101–111)
CO2: 25 mmol/L (ref 22–32)
CREATININE: 1.84 mg/dL — AB (ref 0.44–1.00)
GFR calc Af Amer: 33 mL/min — ABNORMAL LOW (ref >60)
GFR calc non Af Amer: 28 mL/min — ABNORMAL LOW (ref >60)
GLUCOSE: 150 mg/dL — AB (ref 65–99)
Potassium: 3.4 mmol/L — ABNORMAL LOW (ref 3.5–5.1)
Sodium: 143 mmol/L (ref 135–145)

## 2015-07-01 LAB — GLUCOSE, CAPILLARY
Glucose-Capillary: 107 mg/dL — ABNORMAL HIGH (ref 65–99)
Glucose-Capillary: 194 mg/dL — ABNORMAL HIGH (ref 65–99)

## 2015-07-01 LAB — MAGNESIUM: Magnesium: 1.5 mg/dL — ABNORMAL LOW (ref 1.7–2.4)

## 2015-07-01 MED ORDER — SENNOSIDES-DOCUSATE SODIUM 8.6-50 MG PO TABS: 1.0000 | ORAL_TABLET | Freq: Two times a day (BID) | ORAL | Status: DC

## 2015-07-01 MED ORDER — OXYCODONE HCL 5 MG PO TABS: 5.0000 mg | ORAL_TABLET | Freq: Four times a day (QID) | ORAL | Status: DC | PRN

## 2015-07-01 MED ORDER — FLUCONAZOLE 100 MG PO TABS: 100.0000 mg | ORAL_TABLET | Freq: Every day | ORAL | Status: DC

## 2015-07-01 MED ORDER — POTASSIUM CHLORIDE CRYS ER 20 MEQ PO TBCR
40.0000 meq | EXTENDED_RELEASE_TABLET | Freq: Once | ORAL | Status: AC
  Administered 2015-07-01: 40 meq via ORAL
  Filled 2015-07-01: qty 2

## 2015-07-01 MED ORDER — LEVOFLOXACIN 750 MG PO TABS: 750.0000 mg | ORAL_TABLET | ORAL | Status: DC

## 2015-07-01 MED ORDER — POLYETHYLENE GLYCOL 3350 17 G PO PACK: 17.0000 g | PACK | Freq: Every day | ORAL | Status: DC

## 2015-07-01 NOTE — Progress Notes (Signed)
Assessment unchanged.  Scripts were given per MD order.  All questions pertaining to D/C we answered.  Pt was D/C'd via ambulation and accompanied by RN.

## 2015-07-01 NOTE — Discharge Summary (Addendum)
Physician Discharge Summary  Laurie [name] IHK:[id number] DOB: 1955/09/05 DOA: 06/24/2015  PCP: Margit Hanks, MD  Admit date: 06/24/2015 Discharge date: 07/01/2015  Time spent: 35 minutes  Recommendations for Outpatient Follow-up:  1. BMP 1-2 weeks Cr follow up 2. See below 3. 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 female   has a past medical history of Diabetes mellitus without complication (HCC); IBS (irritable bowel syndrome); Interstitial cystitis; Myofascial pain syndrome; Carpal tunnel syndrome; GI bleed; Coronary artery disease; B12 deficiency; CKD (chronic kidney disease); Hypertension; Iron deficiency anemia; Glaucoma; Hepatitis C; Anal fissure; Candidal esophagitis (HCC); Obesity (BMI 35.0-39.9 without comorbidity) (HCC); Diabetic gastropathy (HCC); Plantar fasciitis; GERD (gastroesophageal reflux disease); Asthma; Chronic UTI; Chronic abdominal pain; and Episodic low back pain.   Presented with left buttock swelling to primary care provider at [facility] she started to have pain and swelling and drainage from a boil of worsening of her past 4 days and was seen by GI doctor she was prescribed Flagyl 500 mg twice a day but there was no improvement. Otherwise she reports mild nausea no vomiting. At the office a diagnosed of recurrent UTI in January she had Serratia marcescens UTI with multiple resistance but sensitive to ceftriaxone resistant to cefazolin also sensitive to  Cipro and Zosyn Patient endorses also some frequent loose BM 3 times a week which is chronic,. Sh is sp urostomy due to hx of chronic intertitial cystitis, noted that urine in the bag was foul smelling more than usual.   IN ER: White blood cell count 16.8 hemoglobin 12.4. Lactic acid 0.84 patient afebrile heart rate 86 CT scan showing focal soft tissue inflammation alone left buttock tracking along proximal left thigh is suggestion of associated small area of fluid measuring 2.31.4 cm though note is well-defined abscess noted  Regarding pertinent past history: Patient receives majority of her care to count 14th of March she have undergone screening colonoscopy was some polyp removal. Endoscopy on the same date showed [place] patient has history of diabetes on Humulin U-500 concentrated  Hospitalist was called for admission for left buttock cellulitis and possible perianal abcess   Hospital Course:  Left Buttock abscess and cellulitis - Surgical consult is appreciated, status post I&D 3/18. - Treated with IV vanc-- d/c (spoke with surgery PA- wound clean) - Wound culture Gram stain showing gram-positive cocci in pairs: MRSA -change to PO levaquin for cellulitis and UTI after discussion with pharmacy  -c/o yeast- add few days of diflucan and reassessment by PCP  Acute renal failure - Patient with significant increase of her creatinine, was 0.84 on admission, peaked at 2.6, unclear if related to contrast-induced nephropathy, possible ATN given urine sodium of 56 secondary to infection - renal ultrasound negative for hydronephrosis (patient has [name] that was built for interstitial cystitis in [facility] at [facility]) -trending down -outpatient BMP to assess for resolution  Diabetes mellitus - CBG better controlled after starting on Lantus,  will change sliding scale from resistant to moderate, will start on NovoLog 4 units 3 times a day before meals. - Patient can be resumed on her home insulin  U5 100 on discharge.  UTI - Urine culture growing Escherichia coli, pansensitive., Change IV Zosyn to Rocephin and then levaquin  Coronary artery disease - Denies any chest pain or shortness of breath, continue with home medication including aspirin, statin and beta blockers  Hypertension - Continue with home medication  Asthma -albuterol neb  Candidal esophagitis - Continue with nystatin swish and swallow  Complains of abdominal pain - KUB with no acute finding, patient reports this problem is chronic, recurrent - Post constipation, continue with laxatives   Procedures:  I/D  Consultations:  surgery  Discharge Exam: Filed Vitals:   06/30/15 2149 07/01/15 0612  BP: 142/68 137/70  Pulse: 78 65  Temp: 97.8 F (36.6 C) 97.7 F (36.5 C)  Resp: 18 18    General: awake, NAD- had large BM earlier-- eating and drinking well   Discharge Instructions   Discharge Instructions    Diet - low sodium heart healthy    Complete by:  As directed      Diet Carb Modified    Complete by:  As directed      Discharge instructions    Complete by:  As directed   BMP 1-2 weeks with PCP re Cr Drink plenty of water     Discharge instructions    Complete by:  As directed   Keep area under breasts dry     Increase activity slowly    Complete by:  As directed           Discharge Medication List as of 07/01/2015 12:08 PM    START taking these medications   Details  levofloxacin (LEVAQUIN) 750 MG tablet Take 1 tablet (750 mg total) by mouth every other day., Starting 07/01/2015, Until Discontinued, Normal    oxyCODONE (OXY IR/ROXICODONE) 5 MG immediate release tablet Take 1 tablet (5 mg total) by mouth every 6 (six) hours as needed for severe pain., Starting 07/01/2015, Until Discontinued, Print    polyethylene glycol (MIRALAX / GLYCOLAX) packet Take 17 g by mouth daily., Starting 07/01/2015, Until Discontinued, OTC    senna-docusate (SENOKOT-S) 8.6-50 MG tablet Take 1 tablet by  mouth 2 (two) times daily., Starting 07/01/2015, Until Discontinued, OTC      CONTINUE these medications which have CHANGED   Details  fluconazole (DIFLUCAN) 100 MG tablet Take 1 tablet (100 mg total) by mouth daily., Starting 07/01/2015, Until Discontinued, Print      CONTINUE these medications which have NOT CHANGED   Details  albuterol (PROVENTIL HFA;VENTOLIN HFA) 108 (90 BASE) MCG/ACT inhaler Inhale 2 puffs into the lungs every 4 (four) hours as needed for wheezing or shortness of breath., Until Discontinued, Historical Med    amLODipine (NORVASC) 5 MG tablet Take 10 mg by mouth daily. , Until Discontinued, Historical Med    aspirin EC 81 MG tablet Take 81 mg by mouth daily., Until Discontinued, Historical Med    budesonide-formoterol (SYMBICORT) 80-4.5 MCG/ACT inhaler Inhale 2 puffs into the lungs 2 (two) times daily., Until Discontinued, Historical Med    cholecalciferol (VITAMIN D) 1000 UNITS tablet Take 1,000 Units by mouth daily., Until Discontinued, Historical Med    colestipol (COLESTID) 1 G tablet Take 1 g by mouth daily. , Until Discontinued, Historical Med    cyanocobalamin 1000 MCG tablet Take 1,000 mcg by mouth daily. , Until Discontinued,  Historical Med    FLUoxetine (PROZAC) 20 MG capsule Take 40 mg by mouth daily., Until Discontinued, Historical Med    gabapentin (NEURONTIN) 300 MG capsule Take 600 mg by mouth 2 (two) times daily., Until Discontinued, Historical Med    hydrocortisone (ANUSOL-HC) 2.5 % rectal cream Place 1 application rectally 3 (three) times daily as needed for hemorrhoids. , Until Discontinued, Historical Med    insulin regular human CONCENTRATED (HUMULIN R) 500 UNIT/ML injection Inject 8-15 Units into the skin 3 (three) times daily. Take 10 units at breakfast, 15 units at lunch and 8 units at dinner., Until Discontinued, Historical Med    isosorbide mononitrate (IMDUR) 60 MG 24 hr tablet Take 60 mg by mouth daily., Until Discontinued, Historical Med     LIRAGLUTIDE [place] Inject 1.8 mg into the skin daily., Until Discontinued, Historical Med    magnesium oxide (MAG-OX) 400 MG tablet Take 400 mg by mouth 2 (two) times daily., Until Discontinued, Historical Med    metoprolol succinate (TOPROL-XL) 100 MG 24 hr tablet Take 100 mg by mouth daily. Take with or immediately following a meal., Until Discontinued, Historical Med    Multiple Vitamin (MULTIVITAMIN WITH MINERALS) TABS tablet Take 1 tablet by mouth daily., Until Discontinued, Historical Med    nystatin (MYCOSTATIN) 100000 UNIT/ML suspension Take 5 mLs by mouth 4 (four) times daily., Until Discontinued, Historical Med    pantoprazole (PROTONIX) 40 MG tablet Take 40 mg by mouth daily., Until Discontinued, Historical Med    pravastatin (PRAVACHOL) 20 MG tablet Take 20 mg by mouth at bedtime., Until Discontinued, Historical Med    promethazine (PHENERGAN) 25 MG tablet Take 25 mg by mouth every 6 (six) hours as needed for nausea or vomiting., Until Discontinued, Historical Med    ranitidine (ZANTAC) 300 MG tablet Take 300 mg by mouth at bedtime., Until Discontinued, Historical Med    temazepam (RESTORIL) 7.5 MG capsule Take 7.5 mg by mouth at bedtime as needed for sleep., Until Discontinued, Historical Med    traZODone (DESYREL) 50 MG tablet Take 50 mg by mouth at bedtime., Until Discontinued, Historical Med    triamcinolone cream (KENALOG) 0.5 % Apply 1 application topically 2 (two) times daily as needed (for skin irritation). , Until Discontinued, Historical Med    fluticasone (FLONASE) 50 MCG/ACT nasal spray Place 2 sprays into both nostrils daily as needed for allergies or rhinitis., Until Discontinued, Historical Med    glucagon (GLUCAGON EMERGENCY) 1 MG injection Inject 1 mg into the vein once as needed (severe insulin reaction)., Until Discontinued, Historical Med    lidocaine (XYLOCAINE) 5 % ointment Apply 1 application topically 4 (four) times daily as needed for moderate pain.,  Until Discontinued, Historical Med    nitroGLYCERIN (NITROSTAT) 0.4 MG SL tablet Place 0.4 mg under the tongue every 5 (five) minutes as needed for chest pain., Until Discontinued, Historical Med    Nitroglycerin (RECTIV) 0.4 % OINT Place 1 application rectally 2 (two) times daily as needed (for chest pain.). , Until Discontinued, Historical Med      STOP taking these medications     lisinopril (PRINIVIL,ZESTRIL) 2.5 MG tablet      loperamide (IMODIUM A-D) 2 MG tablet      metFORMIN (GLUCOPHAGE-XR) 500 MG 24 hr tablet      cephALEXin (KEFLEX) 500 MG capsule      metroNIDAZOLE (FLAGYL) 500 MG tablet      oxyCODONE-acetaminophen (PERCOCET) 5-325 MG per tablet        Allergies  Allergen  Reactions  . Celebrex [Celecoxib] Itching  . Ciprofloxacin Other (See Comments)    No history of rash or SOB. Had kidney problems when she took cipro but was not told she had interstitial nephritis.  Marland Kitchen Cymbalta [Duloxetine Hcl]     GI upset / constipation.   . Dilaudid [Hydromorphone Hcl] Itching  . Diuretic [[place]-Hydran] Other (See Comments)    Dehydration. Elevated creatini  . Fentanyl And Related Itching  . Glucophage [Metformin Hcl] Other (See Comments)    Stopped due to increase creat  . Lyrica [Pregabalin] Other (See Comments)    CNS disorder. Headache  . Oxycodone Itching  . Morphine And Related Rash  . Sulfa Antibiotics Rash  . Vicodin [Hydrocodone-Acetaminophen] Rash   Follow-up Information    Follow up with [facility] On 07/13/2015.   Specialty:  General Surgery   Why:  You have an appointment at 11:30 AM, be at the office 30 minutes early for check in.   Contact information:   [address] [place] [address] [phone]       Follow up with [facility].   Why:  nurse for wound care/dressing changes   Contact information:   [address] [place] [address] [phone]       Follow up with  Margit Hanks, MD In 1 week.   Specialty:  Internal Medicine   Contact information:   [address] [place] [address] [phone]        The results of significant diagnostics from this hospitalization (including imaging, microbiology, ancillary and laboratory) are listed below for reference.    Significant Diagnostic Studies: Dg Chest 2 View  06/27/2015  CLINICAL DATA:  Patient reports cough, medicated hypertension and ex-smoker. EXAM: CHEST - 2 VIEW COMPARISON:  05/02/2007 FINDINGS: Lungs are clear. Heart size and mediastinal contours are within normal limits. No effusion. Visualized skeletal structures are unremarkable. IMPRESSION: No acute cardiopulmonary disease. Electronically Signed   By: Corlis Leak M.D.   On: 06/27/2015 16:05   Ct Pelvis W Contrast  06/24/2015  CLINICAL DATA:  Weeping portal at the left lower buttocks, near the rectum. Pain and swelling. Initial encounter. EXAM: CT PELVIS WITH CONTRAST TECHNIQUE: Multidetector CT imaging of the pelvis was performed using the standard protocol following the bolus administration of intravenous contrast. CONTRAST:  OMNIPAQUE IOHEXOL 300 MG/ML  SOLN COMPARISON:  None. FINDINGS: Focal soft tissue inflammation is noted along the left buttock, tracking along the proximal left thigh. There is suggestion of an associated small area of fluid measuring 2.3 x 1.4 cm, though no well defined abscess is seen. This may simply reflect more focal soft tissue edema. The patient's urostomy is grossly unremarkable in appearance. Visualized small and large bowel loops are grossly unremarkable, aside from diverticulosis along the sigmoid colon. No acute osseous abnormalities are identified. Scattered vascular calcifications are seen. IMPRESSION: 1. Focal soft tissue inflammation along the left buttock, tracking along the proximal left thigh. Suggestion of associated small area of fluid measuring 2.3 x 1.4 cm, though no well defined abscess is seen.  This may simply reflect more focal soft tissue edema. 2. Diverticulosis along the sigmoid colon. 3. Scattered vascular calcifications seen. Electronically Signed   By: Roanna Raider M.D.   On: 06/24/2015 21:30   US Renal  06/28/2015  CLINICAL DATA:  Acute renal failure. Elevated creatinine. History of interstitial cystitis in the bladder and status post cystectomy. EXAM: RENAL / URINARY TRACT ULTRASOUND COMPLETE  COMPARISON:  CT scan 12/28/2014 FINDINGS: Right Kidney: Length: 10.2 cm. Normal renal cortical thickness and echogenicity without focal lesions or hydronephrosis. Left Kidney: Length: 11.5 cm. Normal renal cortical thickness and echogenicity without focal lesions or hydronephrosis. Bladder: Surgically absent. IMPRESSION: Normal sonographic appearance of both kidneys.  No hydronephrosis. Status post cystectomy. Electronically Signed   By: Rudie Meyer M.D.   On: 06/28/2015 20:49   Dg Abd Portable 1v  06/26/2015  CLINICAL DATA:  Abdominal cramping for 1 week EXAM: PORTABLE ABDOMEN - 1 VIEW COMPARISON:  None. FINDINGS: Scattered large and small bowel gas is noted. Postsurgical changes are seen. No abnormal mass or abnormal calcifications are noted. No free air is seen. No acute bony abnormality is noted. IMPRESSION: No acute abnormality noted. Electronically Signed   By: Alcide Clever M.D.   On: 06/26/2015 18:06    Microbiology: Recent Results (from the past 240 hour(s))  Urine culture     Status: None   Collection Time: 06/25/15 12:20 AM  Result Value Ref Range Status   Specimen Description URINE, CLEAN CATCH  Final   Special Requests NONE  Final   Culture   Final    >=100,000 COLONIES/mL ESCHERICHIA COLI Performed at [facility]    Report Status 06/27/2015 FINAL  Final   Organism ID, Bacteria ESCHERICHIA COLI  Final      Susceptibility   Escherichia coli - MIC*    AMPICILLIN <=2 SENSITIVE Sensitive     CEFAZOLIN <=4 SENSITIVE Sensitive     CEFTRIAXONE <=1 SENSITIVE Sensitive      CIPROFLOXACIN <=0.25 SENSITIVE Sensitive     GENTAMICIN <=1 SENSITIVE Sensitive     IMIPENEM <=0.25 SENSITIVE Sensitive     NITROFURANTOIN <=16 SENSITIVE Sensitive     TRIMETH/SULFA <=20 SENSITIVE Sensitive     AMPICILLIN/SULBACTAM <=2 SENSITIVE Sensitive     PIP/TAZO <=4 SENSITIVE Sensitive     * >=100,000 COLONIES/mL ESCHERICHIA COLI  Surgical pcr screen     Status: Abnormal   Collection Time: 06/25/15  9:10 AM  Result Value Ref Range Status   MRSA, PCR POSITIVE (A) NEGATIVE Final    Comment: RESULT CALLED TO, READ BACK BY AND VERIFIED WITH: P RAMIREZ AT 1100 ON 03.18.2017 BY NBROOKS    Staphylococcus aureus POSITIVE (A) NEGATIVE Final    Comment:        The Xpert SA Assay (FDA approved for NASAL specimens in patients over 58 years of age), is one component of a comprehensive surveillance program.  Test performance has been validated by [facility] for patients greater than or equal to 78 year old. It is not intended to diagnose infection nor to guide or monitor treatment.   Anaerobic culture     Status: None   Collection Time: 06/25/15 11:25 AM  Result Value Ref Range Status   Specimen Description BUTTOCKS LEFT TISSUE  Final   Special Requests NONE  Final   Gram Stain   Final    ABUNDANT WBC PRESENT, PREDOMINANTLY PMN ABUNDANT GRAM POSITIVE COCCI IN PAIRS IN CLUSTERS Performed at [facility]    Culture   Final    NO ANAEROBES ISOLATED Performed at [facility]    Report Status 07/01/2015 FINAL  Final  Tissue culture     Status: None   Collection Time: 06/25/15 11:25 AM  Result Value Ref Range Status   Specimen Description BUTTOCKS LEFT TISSUE  Final   Special Requests NONE  Final   Gram Stain   Final  ABUNDANT WBC PRESENT, PREDOMINANTLY PMN ABUNDANT GRAM POSITIVE COCCI IN PAIRS IN CLUSTERS [facility]formed at [facility]    Culture   Final    ABUNDANT METHICILLIN RESISTANT STAPHYLOCOCCUS AUREUS Note: RIFAMPIN AND GENTAMICIN SHOULD  NOT BE USED AS SINGLE DRUGS FOR TREATMENT OF STAPH INFECTIONS. This organism DOES NOT demonstrate inducible Clindamycin resistance in vitro. CRITICAL RESULT CALLED TO, READ BACK BY AND VERIFIED WITH: PAULA A. AT  9:10AM ON 06/30/2015 HAJAM Performed at [facility]    Report Status 06/30/2015 FINAL  Final   Organism ID, Bacteria METHICILLIN RESISTANT STAPHYLOCOCCUS AUREUS  Final      Susceptibility   Methicillin resistant staphylococcus aureus - MIC*    CLINDAMYCIN <=0.25 SENSITIVE Sensitive     ERYTHROMYCIN >=8 RESISTANT Resistant     GENTAMICIN <=0.5 SENSITIVE Sensitive     LEVOFLOXACIN 0.25 SENSITIVE Sensitive     OXACILLIN >=4 RESISTANT Resistant     RIFAMPIN <=0.5 SENSITIVE Sensitive     TRIMETH/SULFA <=10 SENSITIVE Sensitive     VANCOMYCIN <=0.5 SENSITIVE Sensitive     TETRACYCLINE <=1 SENSITIVE Sensitive     * ABUNDANT METHICILLIN RESISTANT STAPHYLOCOCCUS AUREUS     Labs: Basic Metabolic Panel:  Recent Labs Lab 06/25/15 0506  06/27/15 0436 06/28/15 0444 06/29/15 0518 06/30/15 0425 07/01/15 0352  NA 139  < > 142 142 140 140 143  K 3.3*  < > 3.9 4.2 4.2 3.8 3.4*  CL 105  < > 108 108 109 108 109  CO2 25  < > 24 25 24 25 25   GLUCOSE 91  < > 181* 168* 154* 254* 150*  BUN 11  < > 25* 28* 26* 21* 19  CREATININE 0.84  < > 2.35* 2.62* 2.14* 1.94* 1.84*  CALCIUM 8.5*  < > 8.7* 8.6* 8.9 9.2 9.0  MG 1.4*  --   --   --   --   --  1.5*  PHOS 3.4  --   --   --   --   --   --   < > = values in this interval not displayed. Liver Function Tests:  Recent Labs Lab 06/24/15 1839 06/25/15 0506  AST 19 17  ALT 18 15  ALKPHOS 75 60  BILITOT 0.7 0.2*  PROT 8.1 6.6  ALBUMIN 4.0 3.1*   No results for input(s): LIPASE, AMYLASE in the last 168 hours. No results for input(s): AMMONIA in the last 168 hours. CBC:  Recent Labs Lab 06/24/15 1839  06/26/15 0535 06/27/15 0436 06/28/15 0444 06/29/15 0518 06/30/15 0425  WBC 16.8*  < > 14.4* 16.4* 12.5* 11.0* 13.5*   NEUTROABS 11.8*  --   --   --   --   --   --   HGB 12.4  < > 11.1* 10.9* 10.9* 10.8* 11.0*  HCT 35.5*  < > 33.8* 34.1* 34.1* 33.3* 32.3*  MCV 91.5  < > 92.3 97.7 98.6 97.1 92.0  PLT 412*  < > 421* 439* 429* 423* 422*  < > = values in this interval not displayed. Cardiac Enzymes: No results for input(s): CKTOTAL, CKMB, CKMBINDEX, TROPONINI in the last 168 hours. BNP: BNP (last 3 results) No results for input(s): BNP in the last 8760 hours.  ProBNP (last 3 results) No results for input(s): PROBNP in the last 8760 hours.  CBG:  Recent Labs Lab 06/30/15 1606 06/30/15 1747 06/30/15 2146 07/01/15 0810 07/01/15 1150  GLUCAP 169* 124* 188* 107* 194*       Signed:  JESSICA U VANN  DO.  [facility] 07/01/2015, 3:14 PM

## 2015-07-01 NOTE — Progress Notes (Signed)
Patient being discharged home. Discharge instructions, prescriptions,  and diabetic diet reviewed with patient, pt verbalized understanding.  Educational handouts provided.    Terrez Ander Rn

## 2015-07-01 NOTE — Care Management Important Message (Signed)
Important Message  Patient Details IM Letter given to [name]/Case Manager to present to Patient Name: Laurie [name] MRN: [id number] Date of Birth: September 10, 1955   Medicare Important Message Given:  Yes    Haskell FlirtJamison, Suman Trivedi 07/01/2015, 12:01 PMImportant Message  Patient Details  Name: Laurie [name] MRN: [id number] Date of Birth: September 10, 1955   Medicare Important Message Given:  Yes    Haskell FlirtJamison, Novelle Addair 07/01/2015, 12:01 PM

## 2016-04-02 IMAGING — US US RENAL
1 series · 14 of 19 positions shown · non-contrast
Comparison: CT scan [DATE]

CLINICAL DATA: Acute renal failure. Elevated creatinine. History of
interstitial cystitis in the bladder and status post cystectomy.

EXAM:
RENAL / URINARY TRACT ULTRASOUND COMPLETE

[Series 1: us renal · 0.26mm/px · 14 of 19 slices shown]
[im 1/19]
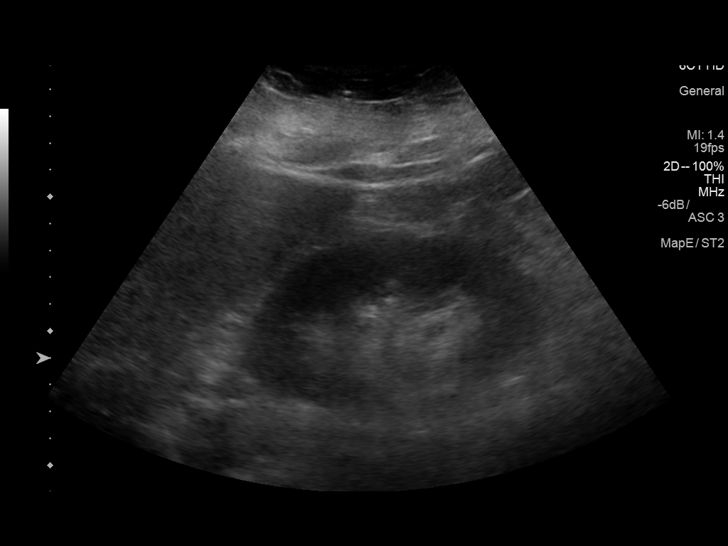
[im 3/19]
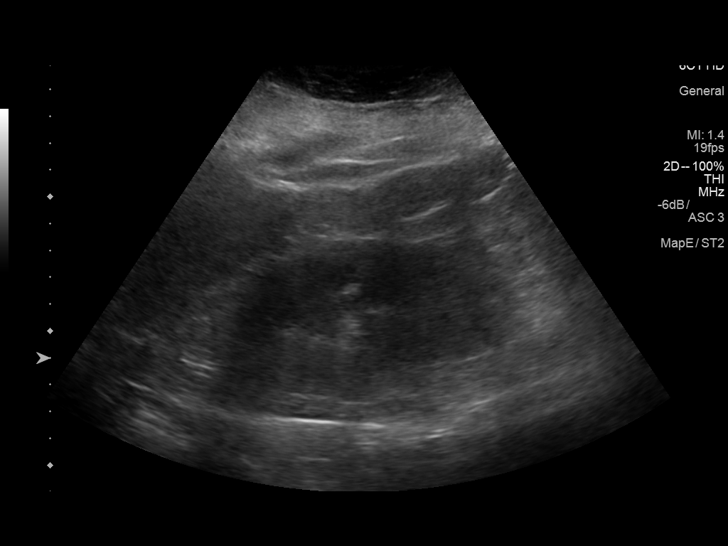
[im 4/19]
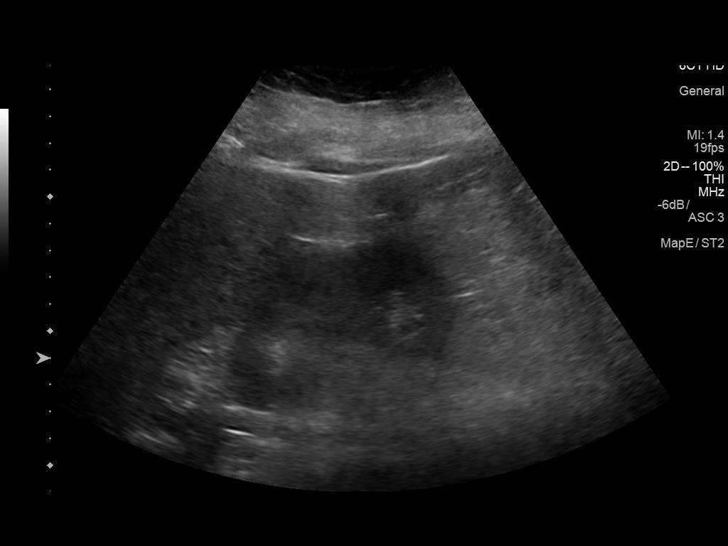
[im 5/19]
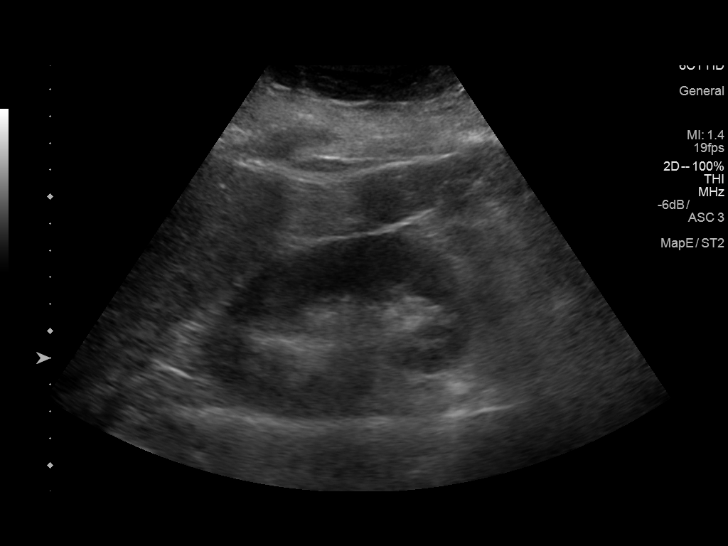
[im 7/19]
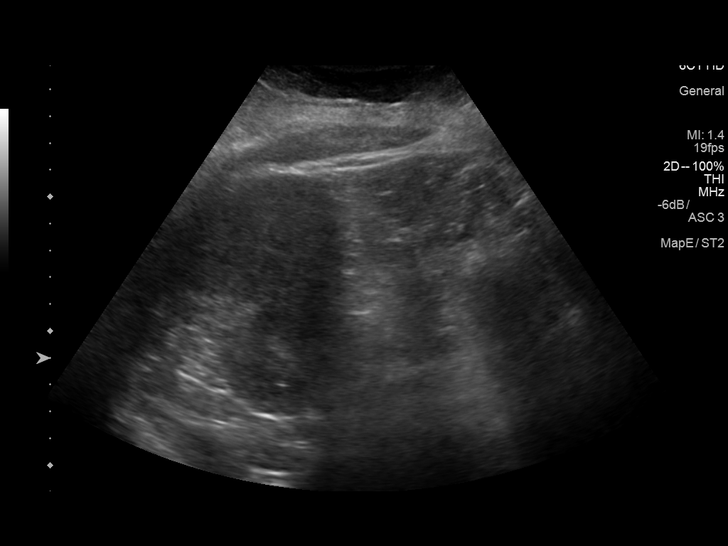
[im 8/19]
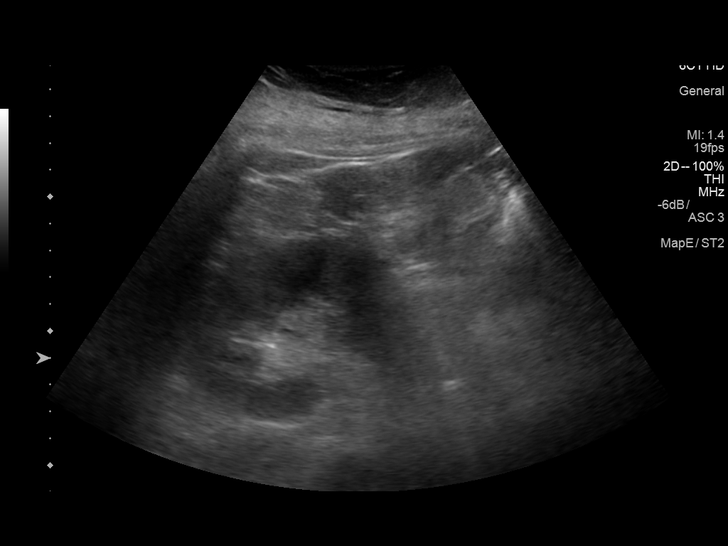
[im 9/19]
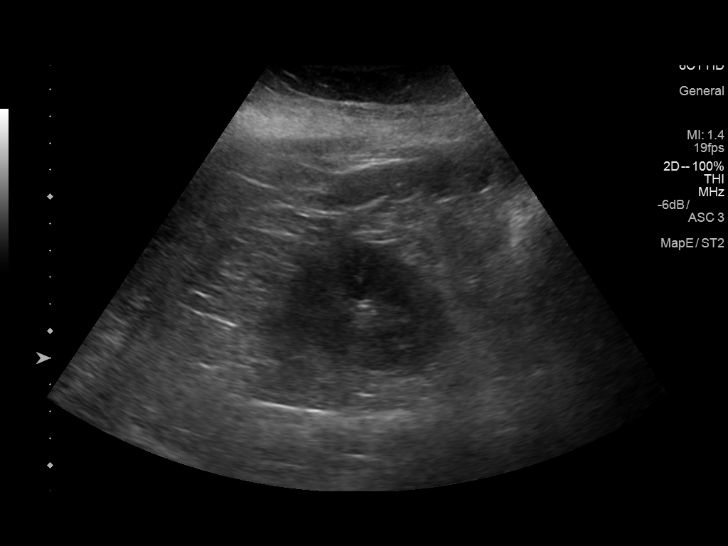
[im 11/19]
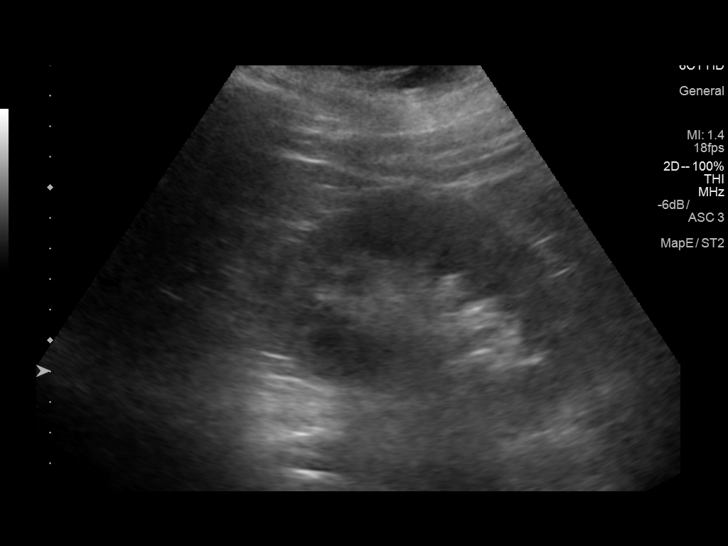
[im 12/19]
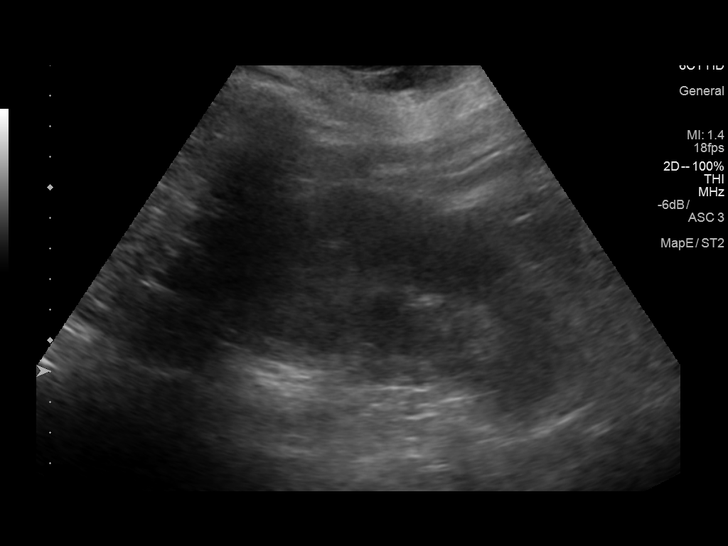
[im 13/19]
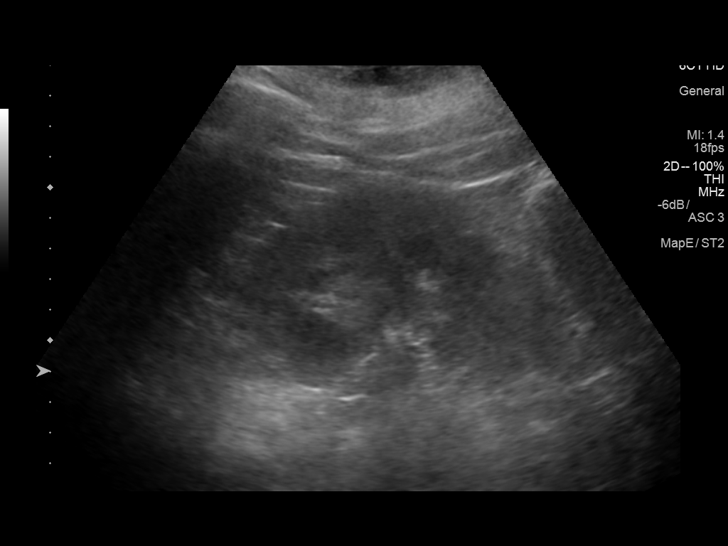
[im 15/19]
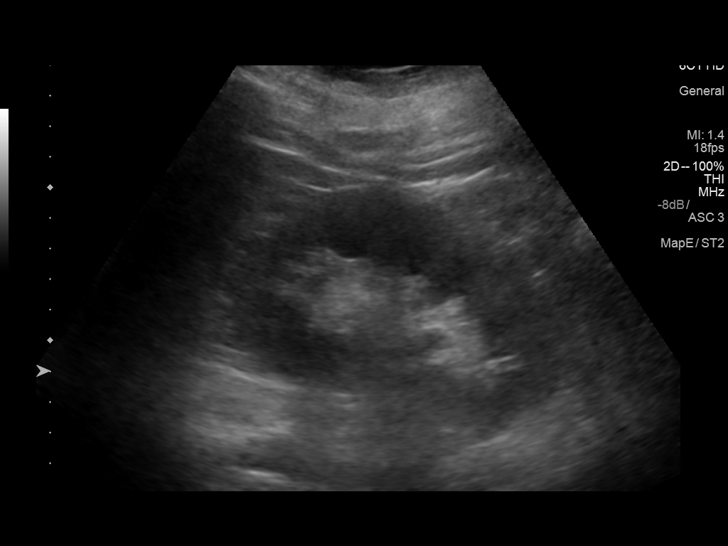
[im 16/19]
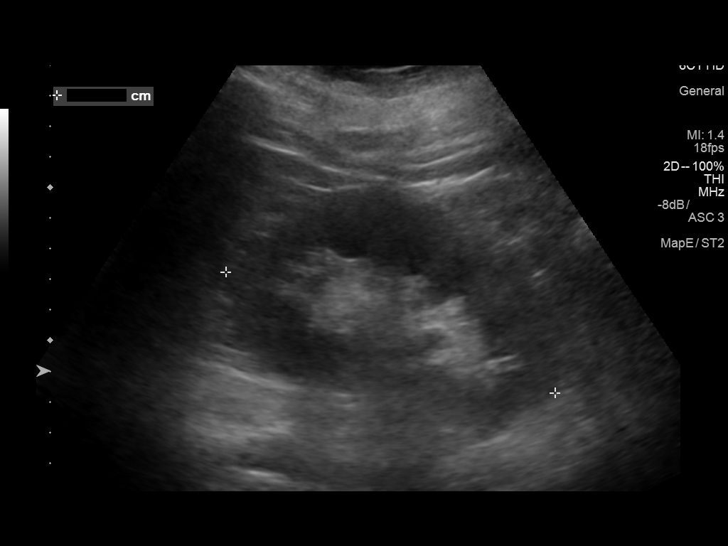
[im 17/19]
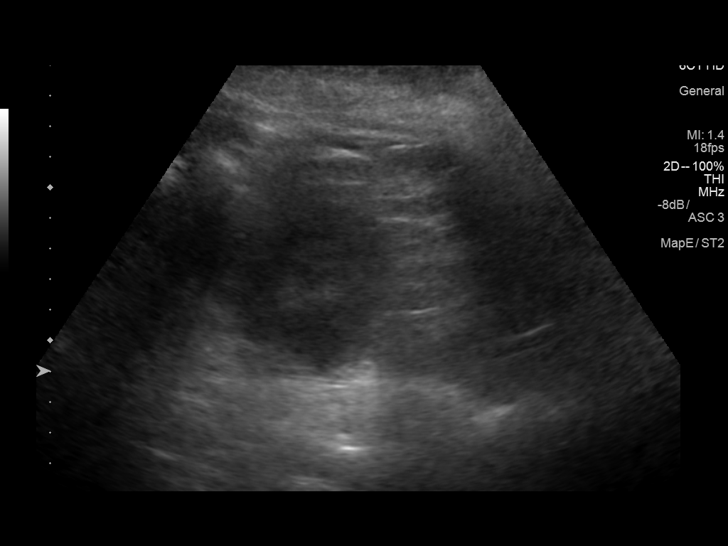
[im 19/19]
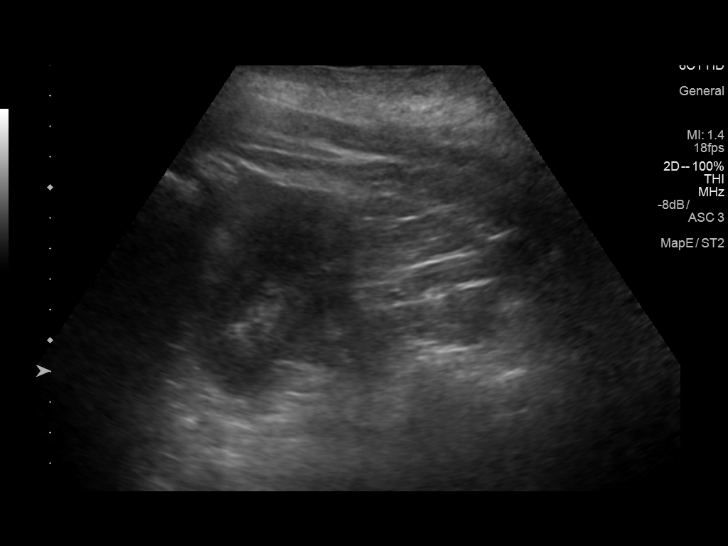

[14 of 19 positions shown; findings below may reference images not displayed]

FINDINGS: Right Kidney:

Length: 10.2 cm. Normal renal cortical thickness and echogenicity
without focal lesions or hydronephrosis.

Left Kidney:

Length: 11.5 cm. Normal renal cortical thickness and echogenicity
without focal lesions or hydronephrosis.

Bladder:

Surgically absent.
IMPRESSION: Normal sonographic appearance of both kidneys.  No hydronephrosis.

Status post cystectomy.

## 2016-08-22 ENCOUNTER — Emergency Department (HOSPITAL_COMMUNITY): Payer: Medicare Other | Admitting: Anesthesiology

## 2016-08-22 ENCOUNTER — Emergency Department (HOSPITAL_COMMUNITY)
Admission: EM | Admit: 2016-08-22 | Discharge: 2016-08-22 | Disposition: A | Discharge status: Home / Self Care | Payer: Medicare Other | Attending: Emergency Medicine | Admitting: Emergency Medicine

## 2016-08-22 ENCOUNTER — Encounter (HOSPITAL_COMMUNITY)
Admission: EM | Disposition: A | Discharge status: Home / Self Care | Payer: Self-pay | Source: Home / Self Care | Attending: Emergency Medicine

## 2016-08-22 ENCOUNTER — Encounter (HOSPITAL_COMMUNITY): Payer: Self-pay

## 2016-08-22 DIAGNOSIS — L03317 Cellulitis of buttock: Secondary | ICD-10-CM | POA: Diagnosis not present

## 2016-08-22 DIAGNOSIS — D509 Iron deficiency anemia, unspecified: Secondary | ICD-10-CM | POA: Diagnosis not present

## 2016-08-22 DIAGNOSIS — Z6836 Body mass index (BMI) 36.0-36.9, adult: Secondary | ICD-10-CM | POA: Diagnosis not present

## 2016-08-22 DIAGNOSIS — B1920 Unspecified viral hepatitis C without hepatic coma: Secondary | ICD-10-CM | POA: Insufficient documentation

## 2016-08-22 DIAGNOSIS — Z79899 Other long term (current) drug therapy: Secondary | ICD-10-CM | POA: Insufficient documentation

## 2016-08-22 DIAGNOSIS — I2510 Atherosclerotic heart disease of native coronary artery without angina pectoris: Secondary | ICD-10-CM | POA: Diagnosis not present

## 2016-08-22 DIAGNOSIS — N182 Chronic kidney disease, stage 2 (mild): Secondary | ICD-10-CM | POA: Insufficient documentation

## 2016-08-22 DIAGNOSIS — K219 Gastro-esophageal reflux disease without esophagitis: Secondary | ICD-10-CM | POA: Insufficient documentation

## 2016-08-22 DIAGNOSIS — E669 Obesity, unspecified: Secondary | ICD-10-CM | POA: Insufficient documentation

## 2016-08-22 DIAGNOSIS — E1122 Type 2 diabetes mellitus with diabetic chronic kidney disease: Secondary | ICD-10-CM | POA: Insufficient documentation

## 2016-08-22 DIAGNOSIS — M545 Low back pain: Secondary | ICD-10-CM | POA: Diagnosis not present

## 2016-08-22 DIAGNOSIS — T18108A Unspecified foreign body in esophagus causing other injury, initial encounter: Secondary | ICD-10-CM | POA: Diagnosis not present

## 2016-08-22 DIAGNOSIS — H409 Unspecified glaucoma: Secondary | ICD-10-CM | POA: Insufficient documentation

## 2016-08-22 DIAGNOSIS — X58XXXA Exposure to other specified factors, initial encounter: Secondary | ICD-10-CM | POA: Insufficient documentation

## 2016-08-22 DIAGNOSIS — Z8249 Family history of ischemic heart disease and other diseases of the circulatory system: Secondary | ICD-10-CM | POA: Insufficient documentation

## 2016-08-22 DIAGNOSIS — Z888 Allergy status to other drugs, medicaments and biological substances status: Secondary | ICD-10-CM | POA: Insufficient documentation

## 2016-08-22 DIAGNOSIS — Z87891 Personal history of nicotine dependence: Secondary | ICD-10-CM | POA: Diagnosis not present

## 2016-08-22 DIAGNOSIS — K602 Anal fissure, unspecified: Secondary | ICD-10-CM | POA: Insufficient documentation

## 2016-08-22 DIAGNOSIS — Z885 Allergy status to narcotic agent status: Secondary | ICD-10-CM | POA: Insufficient documentation

## 2016-08-22 DIAGNOSIS — T18128A Food in esophagus causing other injury, initial encounter: Secondary | ICD-10-CM | POA: Diagnosis not present

## 2016-08-22 DIAGNOSIS — E1169 Type 2 diabetes mellitus with other specified complication: Secondary | ICD-10-CM | POA: Insufficient documentation

## 2016-08-22 DIAGNOSIS — K319 Disease of stomach and duodenum, unspecified: Secondary | ICD-10-CM | POA: Diagnosis not present

## 2016-08-22 DIAGNOSIS — Z90710 Acquired absence of both cervix and uterus: Secondary | ICD-10-CM | POA: Insufficient documentation

## 2016-08-22 DIAGNOSIS — E538 Deficiency of other specified B group vitamins: Secondary | ICD-10-CM | POA: Insufficient documentation

## 2016-08-22 DIAGNOSIS — K589 Irritable bowel syndrome without diarrhea: Secondary | ICD-10-CM | POA: Insufficient documentation

## 2016-08-22 DIAGNOSIS — W44F3XA Food entering into or through a natural orifice, initial encounter: Secondary | ICD-10-CM

## 2016-08-22 DIAGNOSIS — I129 Hypertensive chronic kidney disease with stage 1 through stage 4 chronic kidney disease, or unspecified chronic kidney disease: Secondary | ICD-10-CM | POA: Insufficient documentation

## 2016-08-22 DIAGNOSIS — G8929 Other chronic pain: Secondary | ICD-10-CM | POA: Insufficient documentation

## 2016-08-22 DIAGNOSIS — Z7982 Long term (current) use of aspirin: Secondary | ICD-10-CM | POA: Insufficient documentation

## 2016-08-22 DIAGNOSIS — Z809 Family history of malignant neoplasm, unspecified: Secondary | ICD-10-CM | POA: Insufficient documentation

## 2016-08-22 DIAGNOSIS — J45909 Unspecified asthma, uncomplicated: Secondary | ICD-10-CM | POA: Insufficient documentation

## 2016-08-22 DIAGNOSIS — Z882 Allergy status to sulfonamides status: Secondary | ICD-10-CM | POA: Insufficient documentation

## 2016-08-22 DIAGNOSIS — Z823 Family history of stroke: Secondary | ICD-10-CM | POA: Insufficient documentation

## 2016-08-22 DIAGNOSIS — Z794 Long term (current) use of insulin: Secondary | ICD-10-CM | POA: Insufficient documentation

## 2016-08-22 HISTORY — PX: ESOPHAGOGASTRODUODENOSCOPY (EGD) WITH PROPOFOL: SHX5813

## 2016-08-22 LAB — I-STAT CHEM 8, ED
BUN: 22 mg/dL — AB (ref 6–20)
CHLORIDE: 108 mmol/L (ref 101–111)
CREATININE: 1.2 mg/dL — AB (ref 0.44–1.00)
Calcium, Ion: 1.25 mmol/L (ref 1.15–1.40)
GLUCOSE: 86 mg/dL (ref 65–99)
HEMATOCRIT: 39 % (ref 36.0–46.0)
HEMOGLOBIN: 13.3 g/dL (ref 12.0–15.0)
POTASSIUM: 3.7 mmol/L (ref 3.5–5.1)
Sodium: 146 mmol/L — ABNORMAL HIGH (ref 135–145)
TCO2: 26 mmol/L (ref 0–100)

## 2016-08-22 LAB — GLUCOSE, CAPILLARY
GLUCOSE-CAPILLARY: 71 mg/dL (ref 65–99)
GLUCOSE-CAPILLARY: 91 mg/dL (ref 65–99)

## 2016-08-22 SURGERY — ESOPHAGOGASTRODUODENOSCOPY (EGD) WITH PROPOFOL
Anesthesia: Monitor Anesthesia Care

## 2016-08-22 MED ORDER — PROPOFOL 10 MG/ML IV BOLUS
INTRAVENOUS | Status: AC
  Filled 2016-08-22: qty 40

## 2016-08-22 MED ORDER — SODIUM CHLORIDE 0.9 % IV SOLN: INTRAVENOUS | Status: DC

## 2016-08-22 MED ORDER — LABETALOL HCL 5 MG/ML IV SOLN
INTRAVENOUS | Status: AC
  Filled 2016-08-22: qty 4

## 2016-08-22 MED ORDER — SUCCINYLCHOLINE CHLORIDE 20 MG/ML IJ SOLN
INTRAMUSCULAR | Status: DC | PRN
  Administered 2016-08-22: 100 mg via INTRAVENOUS

## 2016-08-22 MED ORDER — GLYCOPYRROLATE 0.2 MG/ML IJ SOLN
INTRAMUSCULAR | Status: DC | PRN
  Administered 2016-08-22: 0.1 mg via INTRAVENOUS

## 2016-08-22 MED ORDER — LIDOCAINE 2% (20 MG/ML) 5 ML SYRINGE
INTRAMUSCULAR | Status: DC | PRN
  Administered 2016-08-22: 80 mg via INTRAVENOUS
  Administered 2016-08-22: 30 mg via INTRAVENOUS

## 2016-08-22 MED ORDER — ONDANSETRON HCL 4 MG/2ML IJ SOLN
INTRAMUSCULAR | Status: DC | PRN
  Administered 2016-08-22: 4 mg via INTRAVENOUS

## 2016-08-22 MED ORDER — PROPOFOL 500 MG/50ML IV EMUL
INTRAVENOUS | Status: DC | PRN
  Administered 2016-08-22: 150 ug/kg/min via INTRAVENOUS

## 2016-08-22 MED ORDER — SODIUM CHLORIDE 0.9 % IV SOLN
INTRAVENOUS | Status: DC
  Administered 2016-08-22: 05:00:00 via INTRAVENOUS

## 2016-08-22 MED ORDER — LABETALOL HCL 5 MG/ML IV SOLN
INTRAVENOUS | Status: DC | PRN
  Administered 2016-08-22: 10 mg via INTRAVENOUS
  Administered 2016-08-22: 5 mg via INTRAVENOUS
  Administered 2016-08-22: 10 mg via INTRAVENOUS
  Administered 2016-08-22: 5 mg via INTRAVENOUS

## 2016-08-22 MED ORDER — DEXAMETHASONE SODIUM PHOSPHATE 10 MG/ML IJ SOLN
INTRAMUSCULAR | Status: DC | PRN
  Administered 2016-08-22: 10 mg via INTRAVENOUS

## 2016-08-22 MED ORDER — LIDOCAINE 2% (20 MG/ML) 5 ML SYRINGE
INTRAMUSCULAR | Status: AC
  Filled 2016-08-22: qty 10

## 2016-08-22 MED ORDER — GLYCOPYRROLATE 0.2 MG/ML IV SOSY
PREFILLED_SYRINGE | INTRAVENOUS | Status: AC
  Filled 2016-08-22: qty 5

## 2016-08-22 MED ORDER — LACTATED RINGERS IV SOLN
INTRAVENOUS | Status: DC
  Administered 2016-08-22: 08:00:00 via INTRAVENOUS

## 2016-08-22 MED ORDER — PROPOFOL 10 MG/ML IV BOLUS
INTRAVENOUS | Status: DC | PRN
  Administered 2016-08-22: 50 mg via INTRAVENOUS
  Administered 2016-08-22: 20 mg via INTRAVENOUS
  Administered 2016-08-22: 30 mg via INTRAVENOUS
  Administered 2016-08-22: 50 mg via INTRAVENOUS
  Administered 2016-08-22: 100 mg via INTRAVENOUS

## 2016-08-22 SURGICAL SUPPLY — 14 items

## 2016-08-22 NOTE — ED Notes (Signed)
Pt alert, lung sounds clear bilaterally. Speaking in complete sentences. No labored breathing noted.

## 2016-08-22 NOTE — Anesthesia Preprocedure Evaluation (Addendum)
Anesthesia Evaluation  Patient identified by MRN, date of birth, ID band Patient awake    Reviewed: Allergy & Precautions, NPO status , Patient's Chart, lab work & pertinent test results  Airway Mallampati: I   Neck ROM: Full    Dental  (+) Edentulous Upper   Pulmonary asthma , former smoker,    breath sounds clear to auscultation       Cardiovascular hypertension, + CAD   Rhythm:Regular Rate:Normal     Neuro/Psych    GI/Hepatic GERD  ,(+) Hepatitis -  Endo/Other  diabetes  Renal/GU Renal disease     Musculoskeletal   Abdominal   Peds  Hematology   Anesthesia Other Findings   Reproductive/Obstetrics                            Anesthesia Physical Anesthesia Plan  ASA: III  Anesthesia Plan:    Post-op Pain Management:    Induction: Intravenous  Airway Management Planned: Natural Airway and Nasal Cannula  Additional Equipment:   Intra-op Plan:   Post-operative Plan:   Informed Consent: I have reviewed the patients History and Physical, chart, labs and discussed the procedure including the risks, benefits and alternatives for the proposed anesthesia with the patient or authorized representative who has indicated his/her understanding and acceptance.     Plan Discussed with: CRNA  Anesthesia Plan Comments:         Anesthesia Quick Evaluation

## 2016-08-22 NOTE — ED Provider Notes (Signed)
[facility]-EMERGENCY DEPT Provider Note   CSN: [id number] Arrival date & time: 08/22/16  0219  Time seen 04:40 AM   History   Chief Complaint Chief Complaint  Patient presents with  . Aspiration    HPI Laurie [name] is a 61 y.o. female.  HPI  per care everywhere patient was seen at [facility] on 12/01/2013 after having a food impaction while eating chicken. She reports about 7 PM tonight she was eating steak and she again feels like she has food stuck in her esophagus. When asked if she can tell where it stuck she points to her suprasternal notch region. Patient is spitting her saliva into a cup. She denies feeling short of breath.  PCP all at [facility] at [facility]  Past Medical History:  Diagnosis Date  . Anal fissure   . Asthma   . B12 deficiency   . Candidal esophagitis (HCC)   . Carpal tunnel syndrome   . Chronic abdominal pain   . Chronic UTI   . CKD (chronic kidney disease)   . Coronary artery disease   . Diabetes mellitus without complication (HCC)   . Diabetic gastropathy (HCC)   . Episodic low back pain   . GERD (gastroesophageal reflux disease)   . GI bleed   . Glaucoma   . Hepatitis C   . Hypertension   . IBS (irritable bowel syndrome)   . Interstitial cystitis   . Iron deficiency anemia   . Myofascial pain syndrome   . Obesity (BMI 35.0-39.9 without comorbidity)   . Plantar fasciitis     Patient Active Problem List   Diagnosis Date Noted  . AKI (acute kidney injury) (HCC) 07/01/2015  . Cellulitis of buttock 06/24/2015  . 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 CAD (coronary artery disease) 06/24/2015  . Essential hypertension 06/24/2015  . Asthma 06/24/2015  . UTI (lower urinary tract infection) 06/24/2015  . Candidal esophagitis (HCC) 06/24/2015  . Perianal abscess 06/24/2015  . Cellulitis of left buttock     Past Surgical History:  Procedure Laterality Date  . ABDOMINAL HYSTERECTOMY    . BILATERAL CARPAL TUNNEL RELEASE    .  BREAST SURGERY    . CHOLECYSTECTOMY    . COLONOSCOPY    . ESOPHAGOGASTRODUODENOSCOPY    . INCISION AND DRAINAGE ABSCESS Left 06/25/2015   Procedure: INCISION AND DRAINAGE ABSCESS;  Surgeon: Romie Levee, MD;  Location: [facility] ORS;  Service: General;  Laterality: Left;  . REVISION UROSTOMY CUTANEOUS      OB History    No data available       Home Medications    Prior to Admission medications   Medication Sig Start Date End Date Taking? Authorizing Provider  albuterol (PROVENTIL HFA;VENTOLIN HFA) 108 (90 BASE) MCG/ACT inhaler Inhale 2 puffs into the lungs every 4 (four) hours as needed for wheezing or shortness of breath.   Yes [provider]  amLODipine (NORVASC) 10 MG tablet Take 10 mg by mouth daily.    Yes [provider]  aspirin EC 81 MG tablet Take 81 mg by mouth daily.   Yes [provider]  budesonide-formoterol (SYMBICORT) 80-4.5 MCG/ACT inhaler Inhale 2 puffs into the lungs 2 (two) times daily.   Yes [provider]  cholecalciferol (VITAMIN D) 1000 UNITS tablet Take 1,000 Units by mouth daily.   Yes [provider]  colestipol (COLESTID) 1 G tablet Take 1 g by mouth daily.    Yes [provider]  cyanocobalamin 1000 MCG tablet Take 1,000 mcg by mouth daily.    Yes [provider]  FLUoxetine (PROZAC) 20 MG capsule Take 20 mg by mouth daily.    Yes [provider]  fluticasone (FLONASE) 50 MCG/ACT nasal spray Place 2 sprays into both nostrils daily as needed for allergies or rhinitis.   Yes [provider]  gabapentin (NEURONTIN) 300 MG capsule Take 600 mg by mouth 2 (two) times daily.   Yes [provider]  hydrocortisone (ANUSOL-HC) 2.5 % rectal cream Place 1 application rectally 3 (three) times daily as needed for hemorrhoids.    Yes [provider]  Insulin Glargine (BASAGLAR KWIKPEN) 100 UNIT/ML SOPN Inject 50 Units into the skin at bedtime. 08/01/16 08/01/17 Yes [provider]  isosorbide mononitrate (IMDUR) 60 MG 24 hr tablet Take 60 mg by mouth daily.   Yes [provider]  lidocaine (XYLOCAINE) 5 % ointment Apply 1 application topically 4 (four) times daily as needed for moderate pain.   Yes [provider]  LIRAGLUTIDE Welch Inject 1.8 mg into the skin daily.   Yes [provider]  magnesium oxide (MAG-OX) 400 MG tablet Take 400 mg by mouth 2 (two) times daily.   Yes [provider]  metoprolol succinate (TOPROL-XL) 100 MG 24 hr tablet Take 100 mg by mouth daily. Take with or immediately following a meal.   Yes [provider]  Multiple Vitamin (MULTIVITAMIN WITH MINERALS) TABS tablet Take 1 tablet by mouth daily.   Yes [provider]  Nitroglycerin (RECTIV) 0.4 % OINT Place 1 application rectally 2 (two) times daily as needed (for chest pain.).    Yes [provider]  pantoprazole (PROTONIX) 40 MG tablet Take 40 mg by mouth daily.   Yes [provider]  pravastatin (PRAVACHOL) 20 MG tablet Take 20 mg by mouth at bedtime.   Yes [provider]  promethazine (PHENERGAN) 25 MG tablet Take 25 mg by mouth every 6 (six) hours as needed for nausea or vomiting.   Yes [provider]  ranitidine (ZANTAC) 300 MG tablet Take 300 mg by mouth at bedtime.   Yes [provider]  temazepam (RESTORIL) 7.5 MG capsule Take 7.5 mg by mouth at bedtime as needed for sleep.   Yes [provider]  traZODone (DESYREL) 50 MG tablet Take 50 mg by mouth at bedtime.   Yes [provider]  triamcinolone cream (KENALOG) 0.5 % Apply 1 application topically 2 (two) times daily as needed (for skin irritation).    Yes [provider]  glucagon (GLUCAGON EMERGENCY) 1 MG injection Inject 1 mg into the vein once as needed (severe insulin reaction).    [provider]  lisinopril (PRINIVIL,ZESTRIL) 2.5 MG tablet Take 2.5 mg by mouth daily. 08/02/16   [provider]  nitroGLYCERIN (NITROSTAT) 0.4 MG SL tablet Place 0.4 mg under the tongue every 5 (five) minutes as needed for chest pain.    [provider]    Family History Family History  Problem Relation Age of Onset  . CAD Mother   . Cancer Father   . Stroke Brother     Social History Social History  Substance Use Topics  . Smoking status: Former Games developermoker  . Smokeless tobacco: Not on file  . Alcohol use No     Allergies   Celebrex [celecoxib]; Ciprofloxacin; Cymbalta [duloxetine hcl]; Dilaudid [hydromorphone hcl]; Diuretic [buchu-cornsilk-ch grass-hydran]; Fentanyl and related; Glucophage [metformin hcl]; Lyrica [pregabalin]; Oxycodone; Morphine and  related; Sulfa antibiotics; and Vicodin [hydrocodone-acetaminophen]   Review of Systems Review of Systems  All other systems reviewed and are negative.    Physical Exam Updated Vital Signs BP (!) 159/99 (BP Location: Left Arm)   Pulse 84   Temp 98.1 F (36.7 C) (Oral)   Resp 18   Ht 5\' 4"  (1.626 m)   Wt 210 lb (95.3 kg)   SpO2 97%   BMI 36.05 kg/m   Vital signs normal except for hypertension   Physical Exam  Constitutional: She is oriented to person, place, and time. She appears well-developed and well-nourished.  Non-toxic appearance. She does not appear ill. She appears distressed.  Pt is spitting into a cup  HENT:  Head: Normocephalic and atraumatic.  Right Ear: External ear normal.  Left Ear: External ear normal.  Nose: Nose normal. No mucosal edema or rhinorrhea.  Mouth/Throat: Oropharynx is clear and moist and mucous membranes are normal. No dental abscesses or uvula swelling.  Eyes: Conjunctivae and EOM are normal. Pupils are equal, round, and reactive to light.  Neck: Normal range of motion and full passive range of motion without pain. Neck supple.  Cardiovascular: Normal rate, regular rhythm and normal heart sounds.  Exam reveals no gallop and no friction rub.   No murmur  heard. Pulmonary/Chest: Effort normal and breath sounds normal. No respiratory distress. She has no wheezes. She has no rhonchi. She has no rales. She exhibits no tenderness and no crepitus.  Abdominal: Normal appearance. There is no rebound.  Musculoskeletal: Normal range of motion. She exhibits edema.  Moves all extremities well.   Neurological: She is alert and oriented to person, place, and time. She has normal strength. No cranial nerve deficit.  Skin: Skin is warm, dry and intact. No rash noted. No erythema. No pallor.  Psychiatric: She has a normal mood and affect. Her speech is normal and behavior is normal. Her mood appears not anxious.  Nursing note and vitals reviewed.    ED Treatments / Results  Labs (all labs ordered are listed, but only abnormal results are displayed) Results for orders placed or performed during the hospital encounter of 08/22/16  I-stat Chem 8, ED  Result Value Ref Range   Sodium 146 (H) 135 - 145 mmol/L   Potassium 3.7 3.5 - 5.1 mmol/L   Chloride 108 101 - 111 mmol/L   BUN 22 (H) 6 - 20 mg/dL   Creatinine, Ser 1.61 (H) 0.44 - 1.00 mg/dL   Glucose, Bld 86 65 - 99 mg/dL   Calcium, Ion 0.96 0.45 - 1.40 mmol/L   TCO2 26 0 - 100 mmol/L   Hemoglobin 13.3 12.0 - 15.0 g/dL   HCT 40.9 81.1 - 91.4 %   Laboratory interpretation all normal except mild renal insufficiency    EKG  EKG Interpretation None       Radiology No results found.  Procedures Procedures (including critical care time)  Medications Ordered in ED Medications  0.9 %  sodium chloride infusion ( Intravenous New Bag/Given 08/22/16 0456)     Initial Impression / Assessment and Plan / ED Course  I have reviewed the triage vital signs and the nursing notes.  Pertinent labs & imaging results that were available during my care of the patient were reviewed by me and considered in my medical decision making (see chart for details).  IV was inserted and basic labs were done, i-STAT  8. I will discuss patient with gastroenterology, she most likely will need to have  this food impaction removed.  06:29 AM Dr Adela Lank, GI will have the day gastroenterologist take patient for endoscopy.  Final Clinical Impressions(s) / ED Diagnoses   Final diagnoses:  Esophageal foreign body, initial encounter  Food impaction of esophagus, initial encounter    Plan endoscopy by GI  Devoria Albe, MD, Concha Pyo, MD 08/22/16 [phone]

## 2016-08-22 NOTE — Op Note (Signed)
[facility] Patient Name: Laurie [name] Procedure Date: 08/22/2016 MRN: [id number] Attending MD: Milus Banister , MD Date of Birth: 1955/12/15 CSN: [id number] Age: 61 Admit Type: Outpatient Procedure:                Upper GI endoscopy Indications:              Foreign body in the esophagus (long history with                            [facility]) Providers:                Milus Banister, MD, Elmer Ramp. Tilden Dome, RN, Cherylynn Ridges, Technician, Heide Scales, CRNA Referring MD:              Medicines:                General Anesthesia Complications:            No immediate complications. Estimated blood loss:                            None. Estimated Blood Loss:     Estimated blood loss: none. Procedure:                Pre-Anesthesia Assessment:                           - Prior to the procedure, a History and Physical                            was performed, and patient medications and                            allergies were reviewed. The patient's tolerance of                            previous anesthesia was also reviewed. The risks                            and benefits of the procedure and the sedation                            options and risks were discussed with the patient.                            All questions were answered, and informed consent                            was obtained. Prior Anticoagulants: The patient has                            taken no previous anticoagulant or antiplatelet  agents. ASA Grade Assessment: II - A patient with                            mild systemic disease. After reviewing the risks                            and benefits, the patient was deemed in                            satisfactory condition to undergo the procedure.                           After obtaining informed consent, the endoscope was                            passed under direct vision. Throughout  the                            procedure, the patient's blood pressure, pulse, and                            oxygen saturations were monitored continuously. The                            [id number] ([id number]) scope was introduced through the                            mouth, and advanced to the antrum of the stomach.                            The upper GI endoscopy was technically difficult                            and complex. The patient tolerated the procedure                            well. Scope In: Scope Out: Findings:      A medium to large sized meat bolus was found in the upper esophagus. I       utilized a variety of tools to facilitate removal Laurie [name] Net, cap suction       and eventuall Talon Grasper). At one point the meat bolus was pulled       into her mouth, oropharynx but I could not remove it further. She       eventually swallowed it again and I elected to have her airway protected       prior to proceeing (changed from Chancellor to General Anesthesia). After       airway intubation, I was then able to safely, completely remove the meat       bolus from her proximal esophagus. Following removal, the mid and       proximal esophagus was noted to be inflamed, friable. No discrete       strictures were found.      The exam was otherwise without abnormality (to body of stomach). Impression:               -  Medium to large meat bolus in the proximal                            esophagus, removed successfully after airway                            protection wtih ET tube.                           - Mid and proximal esophagus was acutely inflamed,                            erythematous but there were no deep tears or                            bleeding. Moderate Sedation:      N/A- Per Anesthesia Care Recommendation:           - Patient has a contact number available for                            emergencies. The signs and symptoms of potential                             delayed complications were discussed with the                            patient. Return to normal activities tomorrow.                            Written discharge instructions were provided to the                            patient.                           - Resume previous diet. You need to chew your food                            VERY well, eat slowly and take SMALL bites.                           - Continue present medications.                           - Follow up with your [facility] team. Procedure Code(s):        --- Professional ---                           [phone], 52, Esophagogastroduodenoscopy, flexible,                            transoral; with removal of foreign body(s) Diagnosis Code(s):        --- Professional ---  C94.496P, Food in esophagus causing other injury,                            initial encounter                           T18.108A, Unspecified foreign body in esophagus                            causing other injury, initial encounter CPT copyright 2016 American Medical Association. All rights reserved. The codes documented in this report are preliminary and upon coder review may  be revised to meet current compliance requirements. Milus Banister, MD 08/22/2016 9:14:16 AM This report has been signed electronically. Number of Addenda: 0

## 2016-08-22 NOTE — Discharge Instructions (Signed)
YOU HAD AN ENDOSCOPIC PROCEDURE TODAY: Refer to the procedure report and other information in the discharge instructions given to you for any specific questions about what was found during the examination. If this information does not answer your questions, please call [place] office at [phone] to clarify.  ° °YOU SHOULD EXPECT: Some feelings of bloating in the abdomen. Passage of more gas than usual. Walking can help get rid of the air that was put into your GI tract during the procedure and reduce the bloating. If you had a lower endoscopy (such as a colonoscopy or flexible sigmoidoscopy) you may notice spotting of blood in your stool or on the toilet paper. Some abdominal soreness may be present for a day or two, also. ° °DIET: Your first meal following the procedure should be a light meal and then it is ok to progress to your normal diet. A half-sandwich or bowl of soup is an example of a good first meal. Heavy or fried foods are harder to digest and may make you feel nauseous or bloated. Drink plenty of fluids but you should avoid alcoholic beverages for 24 hours. If you had a esophageal dilation, please see attached instructions for diet.   ° °ACTIVITY: Your care partner should take you home directly after the procedure. You should plan to take it easy, moving slowly for the rest of the day. You can resume normal activity the day after the procedure however YOU SHOULD NOT DRIVE, use power tools, machinery or perform tasks that involve climbing or major physical exertion for 24 hours (because of the sedation medicines used during the test).  ° °SYMPTOMS TO REPORT IMMEDIATELY: °A gastroenterologist can be reached at any hour. Please call [phone]  for any of the following symptoms:  °Following lower endoscopy (colonoscopy, flexible sigmoidoscopy) °Excessive amounts of blood in the stool  °Significant tenderness, worsening of abdominal pains  °Swelling of the abdomen that is new, acute  °Fever of 100° or  higher  °Following upper endoscopy (EGD, EUS, ERCP, esophageal dilation) °Vomiting of blood or coffee ground material  °New, significant abdominal pain  °New, significant chest pain or pain under the shoulder blades  °Painful or persistently difficult swallowing  °New shortness of breath  °Black, tarry-looking or red, bloody stools ° °FOLLOW UP:  °If any biopsies were taken you will be contacted by phone or by letter within the next 1-3 weeks. Call [phone]  if you have not heard about the biopsies in 3 weeks.  °Please also call with any specific questions about appointments or follow up tests. ° °

## 2016-08-22 NOTE — H&P (Signed)
HPI: This is a 61 yo woman with esophageal food impaction  Chief complaint is dysphagia, food impaction  ROS: complete GI ROS as described in HPI, all other review negative.  Constitutional:  No unintentional weight loss   Past Medical History:  Diagnosis Date  . Anal fissure   . Asthma   . B12 deficiency   . Candidal esophagitis (HCC)   . Carpal tunnel syndrome   . Chronic abdominal pain   . Chronic UTI   . CKD (chronic kidney disease)   . Coronary artery disease   . Diabetes mellitus without complication (HCC)   . Diabetic gastropathy (HCC)   . Episodic low back pain   . GERD (gastroesophageal reflux disease)   . GI bleed   . Glaucoma   . Hepatitis C   . Hypertension   . IBS (irritable bowel syndrome)   . Interstitial cystitis   . Iron deficiency anemia   . Myofascial pain syndrome   . Obesity (BMI 35.0-39.9 without comorbidity)   . Plantar fasciitis     Past Surgical History:  Procedure Laterality Date  . ABDOMINAL HYSTERECTOMY    . BILATERAL CARPAL TUNNEL RELEASE    . BREAST SURGERY    . CHOLECYSTECTOMY    . COLONOSCOPY    . ESOPHAGOGASTRODUODENOSCOPY    . INCISION AND DRAINAGE ABSCESS Left 06/25/2015   Procedure: INCISION AND DRAINAGE ABSCESS;  Surgeon: Romie Levee, MD;  Location: [facility];  Service: General;  Laterality: Left;  . REVISION UROSTOMY CUTANEOUS      Current Facility-Administered Medications  Medication Dose Route Frequency Provider Last Rate Last Dose  . 0.9 %  sodium chloride infusion   Intravenous Continuous Devoria Albe, MD 100 mL/hr at 08/22/16 0456    . 0.9 %  sodium chloride infusion   Intravenous Continuous Rachael Fee, MD      . lactated ringers infusion   Intravenous Continuous Rachael Fee, MD        Allergies as of 08/22/2016 - Review Complete 08/22/2016  Allergen Reaction Noted  . Celebrex [celecoxib] Itching 12/28/2014  . Ciprofloxacin Other (See Comments) 12/28/2014  . Cymbalta [duloxetine hcl]  12/28/2014  .  Dilaudid [hydromorphone hcl] Itching 12/28/2014  . Diuretic [buchu-cornsilk-ch grass-hydran] Other (See Comments) 12/28/2014  . Fentanyl and related Itching 06/24/2015  . Glucophage [metformin hcl] Other (See Comments) 12/28/2014  . Lyrica [pregabalin] Other (See Comments) 12/28/2014  . Oxycodone Itching 12/28/2014  . Morphine and related Rash 12/28/2014  . Sulfa antibiotics Rash 12/28/2014  . Vicodin [hydrocodone-acetaminophen] Rash 12/28/2014    Family History  Problem Relation Age of Onset  . CAD Mother   . Cancer Father   . Stroke Brother     Social History   Social History  . Marital status: Married    Spouse name: N/A  . Number of children: N/A  . Years of education: N/A   Occupational History  . Not on file.   Social History Main Topics  . Smoking status: Former Games developer  . Smokeless tobacco: Never Used  . Alcohol use No  . Drug use: No  . Sexual activity: Not on file   Other Topics Concern  . Not on file   Social History Narrative  . No narrative on file     Physical Exam: BP (!) 160/89 (BP Location: Left Arm)   Pulse 79   Temp 98.1 F (36.7 C) (Oral)   Resp 18   Ht 5\' 4"  (1.626 m)   Wt 210 lb (  95.3 kg)   SpO2 100%   BMI 36.05 kg/m  Constitutional: generally well-appearing Psychiatric: alert and oriented x3 Abdomen: soft, nontender, nondistended, no obvious ascites, no peritoneal signs, normal bowel sounds No peripheral edema noted in lower extremities  Assessment and plan: 61 y.o. female with esophageal food impaction  For EGD this morning, removal of foreign body  Please see the "Patient Instructions" section for addition details about the plan.  Rob Buntinganiel Kristie Bracewell, MD [facility] 08/22/2016, 8:00 AM

## 2016-08-22 NOTE — ED Notes (Signed)
Patient being taken to Endo

## 2016-08-22 NOTE — Anesthesia Procedure Notes (Signed)
Procedure Name: Intubation Date/Time: 08/22/2016 8:52 AM Performed by: Deliah Boston Pre-anesthesia Checklist: Patient identified, Emergency Drugs available, Suction available and Patient being monitored Patient Re-evaluated:Patient Re-evaluated prior to inductionOxygen Delivery Method: Circle system utilized Preoxygenation: Pre-oxygenation with 100% oxygen Intubation Type: IV induction and Rapid sequence Laryngoscope Size: Mac and 3 Grade View: Grade I Tube type: Oral Number of attempts: 1 Airway Equipment and Method: Stylet and Oral airway Placement Confirmation: ETT inserted through vocal cords under direct vision,  positive ETCO2 and breath sounds checked- equal and bilateral Secured at: 21 cm Tube secured with: Tape Dental Injury: Teeth and Oropharynx as per pre-operative assessment

## 2016-08-22 NOTE — Anesthesia Postprocedure Evaluation (Signed)
Anesthesia Post Note  Patient: Laurie [name]  Procedure(s) Performed: Procedure(s) (LRB): ESOPHAGOGASTRODUODENOSCOPY (EGD) WITH PROPOFOL (N/A)  Patient location during evaluation: Endoscopy Anesthesia Type: MAC Level of consciousness: awake and alert Pain management: pain level controlled Vital Signs Assessment: post-procedure vital signs reviewed and stable Respiratory status: spontaneous breathing, nonlabored ventilation, respiratory function stable and patient connected to nasal cannula oxygen Cardiovascular status: stable and blood pressure returned to baseline Anesthetic complications: no       Last Vitals:  Vitals:   08/22/16 1002 08/22/16 1010  BP:    Pulse: 86   Resp: (!) 21 15  Temp:      Last Pain:  Vitals:   08/22/16 1000  TempSrc: Oral  PainSc:                  Aiman Sonn,JAMES TERRILL

## 2016-08-22 NOTE — ED Triage Notes (Addendum)
Pt reports at 1900 5/15 she was eating steak and was unable to swallow it completely. Pt reports hx of foreign body in the throat requiring surgical intervention to retrieve. Pt speaking in complete sentences and breathing WNL, no circumoral cyanosis noted.

## 2016-08-22 NOTE — Transfer of Care (Signed)
Immediate Anesthesia Transfer of Care Note  Patient: Laurie [name]  Procedure(s) Performed: Procedure(s): ESOPHAGOGASTRODUODENOSCOPY (EGD) WITH PROPOFOL (N/A)  Patient Location: PACU  Anesthesia Type:General  Level of Consciousness: Patient easily awoken, sedated, comfortable, cooperative, following commands, responds to stimulation.   Airway & Oxygen Therapy: Patient spontaneously breathing, ventilating well, oxygen via simple oxygen mask.  Post-op Assessment: Report given to PACU RN, vital signs reviewed and stable, moving all extremities.   Post vital signs: Reviewed and stable.  Complications: No apparent anesthesia complications Last Vitals:  Vitals:   08/22/16 0803 08/22/16 0918  BP:    Pulse: 84 90  Resp: 14 (!) 26  Temp: 36.6 C 36.4 C    Last Pain:  Vitals:   08/22/16 0918  TempSrc: Oral  PainSc: 8          Complications: No apparent anesthesia complications

## 2016-08-22 NOTE — ED Notes (Signed)
Pt was eating steak this evening when a piece became lodged in her throat. Pt unable to swallow, but able to breathe, and speaking in full sentences.

## 2016-08-23 ENCOUNTER — Encounter (HOSPITAL_COMMUNITY): Payer: Self-pay | Admitting: Gastroenterology

## 2017-02-11 ENCOUNTER — Encounter (HOSPITAL_COMMUNITY)
Admission: EM | Disposition: A | Discharge status: Home / Self Care | Payer: Self-pay | Source: Home / Self Care | Attending: Emergency Medicine

## 2017-02-11 ENCOUNTER — Emergency Department (HOSPITAL_COMMUNITY): Payer: Medicare Other | Admitting: Certified Registered Nurse Anesthetist

## 2017-02-11 ENCOUNTER — Encounter (HOSPITAL_COMMUNITY): Payer: Self-pay | Admitting: *Deleted

## 2017-02-11 ENCOUNTER — Emergency Department (HOSPITAL_COMMUNITY)
Admission: EM | Admit: 2017-02-11 | Discharge: 2017-02-11 | Disposition: A | Discharge status: Home / Self Care | Payer: Medicare Other | Attending: Emergency Medicine | Admitting: Emergency Medicine

## 2017-02-11 DIAGNOSIS — J45909 Unspecified asthma, uncomplicated: Secondary | ICD-10-CM | POA: Insufficient documentation

## 2017-02-11 DIAGNOSIS — N182 Chronic kidney disease, stage 2 (mild): Secondary | ICD-10-CM | POA: Insufficient documentation

## 2017-02-11 DIAGNOSIS — I129 Hypertensive chronic kidney disease with stage 1 through stage 4 chronic kidney disease, or unspecified chronic kidney disease: Secondary | ICD-10-CM | POA: Diagnosis not present

## 2017-02-11 DIAGNOSIS — Z87891 Personal history of nicotine dependence: Secondary | ICD-10-CM | POA: Insufficient documentation

## 2017-02-11 DIAGNOSIS — Y998 Other external cause status: Secondary | ICD-10-CM | POA: Insufficient documentation

## 2017-02-11 DIAGNOSIS — Y9389 Activity, other specified: Secondary | ICD-10-CM | POA: Insufficient documentation

## 2017-02-11 DIAGNOSIS — T18128A Food in esophagus causing other injury, initial encounter: Secondary | ICD-10-CM

## 2017-02-11 DIAGNOSIS — I2510 Atherosclerotic heart disease of native coronary artery without angina pectoris: Secondary | ICD-10-CM | POA: Diagnosis not present

## 2017-02-11 DIAGNOSIS — Y929 Unspecified place or not applicable: Secondary | ICD-10-CM | POA: Insufficient documentation

## 2017-02-11 DIAGNOSIS — T189XXA Foreign body of alimentary tract, part unspecified, initial encounter: Secondary | ICD-10-CM | POA: Diagnosis present

## 2017-02-11 DIAGNOSIS — X58XXXA Exposure to other specified factors, initial encounter: Secondary | ICD-10-CM | POA: Insufficient documentation

## 2017-02-11 DIAGNOSIS — K219 Gastro-esophageal reflux disease without esophagitis: Secondary | ICD-10-CM | POA: Diagnosis not present

## 2017-02-11 DIAGNOSIS — K209 Esophagitis, unspecified: Secondary | ICD-10-CM | POA: Diagnosis not present

## 2017-02-11 DIAGNOSIS — E1122 Type 2 diabetes mellitus with diabetic chronic kidney disease: Secondary | ICD-10-CM | POA: Insufficient documentation

## 2017-02-11 DIAGNOSIS — Z794 Long term (current) use of insulin: Secondary | ICD-10-CM | POA: Insufficient documentation

## 2017-02-11 DIAGNOSIS — K222 Esophageal obstruction: Secondary | ICD-10-CM

## 2017-02-11 DIAGNOSIS — K224 Dyskinesia of esophagus: Secondary | ICD-10-CM

## 2017-02-11 DIAGNOSIS — W44F3XA Food entering into or through a natural orifice, initial encounter: Secondary | ICD-10-CM

## 2017-02-11 HISTORY — PX: ESOPHAGOGASTRODUODENOSCOPY (EGD) WITH PROPOFOL: SHX5813

## 2017-02-11 LAB — GLUCOSE, CAPILLARY: GLUCOSE-CAPILLARY: 88 mg/dL (ref 65–99)

## 2017-02-11 SURGERY — ESOPHAGOGASTRODUODENOSCOPY (EGD) WITH PROPOFOL
Anesthesia: Monitor Anesthesia Care

## 2017-02-11 MED ORDER — LIDOCAINE 2% (20 MG/ML) 5 ML SYRINGE
INTRAMUSCULAR | Status: DC | PRN
  Administered 2017-02-11: 75 mg via INTRAVENOUS

## 2017-02-11 MED ORDER — ONDANSETRON HCL 4 MG/2ML IJ SOLN
INTRAMUSCULAR | Status: DC | PRN
  Administered 2017-02-11: 4 mg via INTRAVENOUS

## 2017-02-11 MED ORDER — SUCCINYLCHOLINE CHLORIDE 200 MG/10ML IV SOSY
PREFILLED_SYRINGE | INTRAVENOUS | Status: DC | PRN
  Administered 2017-02-11: 120 mg via INTRAVENOUS

## 2017-02-11 MED ORDER — FENTANYL CITRATE (PF) 100 MCG/2ML IJ SOLN
INTRAMUSCULAR | Status: AC
  Filled 2017-02-11: qty 2

## 2017-02-11 MED ORDER — FENTANYL CITRATE (PF) 250 MCG/5ML IJ SOLN
INTRAMUSCULAR | Status: DC | PRN
Start: 2017-02-11 — End: 2017-02-11
  Administered 2017-02-11 (×2): 50 ug via INTRAVENOUS

## 2017-02-11 MED ORDER — PROPOFOL 10 MG/ML IV BOLUS
INTRAVENOUS | Status: DC | PRN
  Administered 2017-02-11: 200 mg via INTRAVENOUS

## 2017-02-11 MED ORDER — GLUCAGON HCL RDNA (DIAGNOSTIC) 1 MG IJ SOLR: 1.0000 mg | Freq: Once | INTRAMUSCULAR | Status: DC

## 2017-02-11 MED ORDER — LACTATED RINGERS IV SOLN
INTRAVENOUS | Status: DC
  Administered 2017-02-11: 15:00:00 via INTRAVENOUS

## 2017-02-11 MED ORDER — PHENYLEPHRINE 40 MCG/ML (10ML) SYRINGE FOR IV PUSH (FOR BLOOD PRESSURE SUPPORT)
PREFILLED_SYRINGE | INTRAVENOUS | Status: AC
  Filled 2017-02-11: qty 20

## 2017-02-11 MED ORDER — PHENYLEPHRINE 40 MCG/ML (10ML) SYRINGE FOR IV PUSH (FOR BLOOD PRESSURE SUPPORT)
PREFILLED_SYRINGE | INTRAVENOUS | Status: AC
  Filled 2017-02-11: qty 10

## 2017-02-11 MED ORDER — ONDANSETRON HCL 4 MG/2ML IJ SOLN
INTRAMUSCULAR | Status: AC
  Filled 2017-02-11: qty 2

## 2017-02-11 MED ORDER — PROPOFOL 10 MG/ML IV BOLUS
INTRAVENOUS | Status: AC
  Filled 2017-02-11: qty 20

## 2017-02-11 MED ORDER — SUCCINYLCHOLINE CHLORIDE 200 MG/10ML IV SOSY
PREFILLED_SYRINGE | INTRAVENOUS | Status: AC
  Filled 2017-02-11: qty 40

## 2017-02-11 MED ORDER — ONDANSETRON HCL 4 MG/2ML IJ SOLN
4.0000 mg | Freq: Once | INTRAMUSCULAR | Status: AC
  Administered 2017-02-11: 4 mg via INTRAVENOUS
  Filled 2017-02-11: qty 2

## 2017-02-11 MED ORDER — LIDOCAINE 2% (20 MG/ML) 5 ML SYRINGE
INTRAMUSCULAR | Status: AC
  Filled 2017-02-11: qty 5

## 2017-02-11 SURGICAL SUPPLY — 14 items

## 2017-02-11 NOTE — ED Triage Notes (Signed)
Pt complains of food stuck in throat since last night. Pt states she was eating potato salad and greens. Pt has been unable to take medications due to difficulty swallowing.

## 2017-02-11 NOTE — ED Provider Notes (Signed)
[facility] Provider Note   CSN: [id number] Arrival date & time: 02/11/17  4098     History   Chief Complaint Chief Complaint  Patient presents with  . Swallowed Foreign Body    food    HPI Laurie [name] is a 61 y.o. female.  61yo F w/ h/o CKD, CAD, HTN, T2DM, GERD, esophageal food impaction who p/w food impaction.  Patient states that last night around 8 PM, she was eating potato salad with greens and felt a bolus of food get stuck in her throat.  Despite multiple attempts at swallowing she has not been able to swallow anything including her own saliva.  She has not been able to take her medications this morning.  The pain that she is having feels exactly like previous episode of food impaction.  She reports some associated shortness of breath because of her saliva.  No abdominal pain.  She follows with [facility] but has also been seen by Dr. Christella Hartigan previously for EGD.   The history is provided by the patient.  Swallowed Foreign Body     Past Medical History:  Diagnosis Date  . Anal fissure   . Asthma   . B12 deficiency   . Candidal esophagitis (HCC)   . Carpal tunnel syndrome   . Chronic abdominal pain   . Chronic UTI   . CKD (chronic kidney disease)   . Coronary artery disease   . Diabetes mellitus without complication (HCC)   . Diabetic gastropathy (HCC)   . Episodic low back pain   . GERD (gastroesophageal reflux disease)   . GI bleed   . Glaucoma   . Hepatitis C   . Hypertension   . IBS (irritable bowel syndrome)   . Interstitial cystitis   . Iron deficiency anemia   . Myofascial pain syndrome   . Obesity (BMI 35.0-39.9 without comorbidity)   . Plantar fasciitis     Patient Active Problem List   Diagnosis Date Noted  . Esophageal obstruction due to food impaction   . Esophageal foreign body, initial encounter   . Food impaction of esophagus   . AKI (acute kidney injury) (HCC) 07/01/2015  . Cellulitis of  buttock 06/24/2015  . 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 CAD (coronary artery disease) 06/24/2015  . Essential hypertension 06/24/2015  . Asthma 06/24/2015  . UTI (lower urinary tract infection) 06/24/2015  . Candidal esophagitis (HCC) 06/24/2015  . Perianal abscess 06/24/2015  . Cellulitis of left buttock     Past Surgical History:  Procedure Laterality Date  . ABDOMINAL HYSTERECTOMY    . BILATERAL CARPAL TUNNEL RELEASE    . BREAST SURGERY    . CHOLECYSTECTOMY    . COLONOSCOPY    . ESOPHAGOGASTRODUODENOSCOPY    . REVISION UROSTOMY CUTANEOUS      OB History    No data available       Home Medications    Prior to Admission medications   Medication Sig Start Date End Date Taking? Authorizing Provider  albuterol (PROVENTIL HFA;VENTOLIN HFA) 108 (90 BASE) MCG/ACT inhaler Inhale 2 puffs into the lungs every 4 (four) hours as needed for wheezing or shortness of breath.   Yes [provider]  amLODipine (NORVASC) 10 MG tablet Take 10 mg by mouth daily.    Yes [provider]  aspirin EC 81 MG tablet Take 81 mg by mouth daily.   Yes [provider]  budesonide-formoterol (SYMBICORT) 80-4.5  MCG/ACT inhaler Inhale 2 puffs into the lungs 2 (two) times daily.   Yes [provider]  cholecalciferol (VITAMIN D) 1000 UNITS tablet Take 1,000 Units by mouth daily.   Yes [provider]  colestipol (COLESTID) 1 G tablet Take 1 g by mouth daily.    Yes [provider]  cyanocobalamin 1000 MCG tablet Take 1,000 mcg by mouth daily.    Yes [provider]  dicyclomine (BENTYL) 20 MG tablet Take 20 mg 2 (two) times daily by mouth. 08/30/16 08/30/17 Yes [provider]  FLUoxetine (PROZAC) 20 MG capsule Take 20 mg by mouth daily.    Yes [provider]  fluticasone (FLONASE) 50 MCG/ACT nasal spray Place 2 sprays into both nostrils daily as needed for allergies or rhinitis.   Yes [provider]    gabapentin (NEURONTIN) 300 MG capsule Take 600 mg by mouth 2 (two) times daily.   Yes [provider]  glucagon (GLUCAGON EMERGENCY) 1 MG injection Inject 1 mg into the vein once as needed (severe insulin reaction).   Yes [provider]  hydrocortisone (ANUSOL-HC) 2.5 % rectal cream Place 1 application rectally 3 (three) times daily as needed for hemorrhoids.    Yes [provider]  Insulin Glargine (BASAGLAR KWIKPEN) 100 UNIT/ML SOPN Inject 50 Units into the skin at bedtime. 08/01/16 08/01/17 Yes [provider]  isosorbide mononitrate (IMDUR) 60 MG 24 hr tablet Take 60 mg by mouth daily.   Yes [provider]  lidocaine (XYLOCAINE) 5 % ointment Apply 1 application topically 4 (four) times daily as needed for moderate pain.   Yes [provider]  LIRAGLUTIDE [place] Inject 1.8 mg into the skin daily.   Yes [provider]  lisinopril (PRINIVIL,ZESTRIL) 2.5 MG tablet Take 2.5 mg by mouth daily. 08/02/16  Yes [provider]  magnesium oxide (MAG-OX) 400 MG tablet Take 400 mg by mouth 2 (two) times daily.   Yes [provider]  metoprolol succinate (TOPROL-XL) 100 MG 24 hr tablet Take 100 mg by mouth daily. Take with or immediately following a meal.   Yes [provider]  Multiple Vitamin (MULTIVITAMIN WITH MINERALS) TABS tablet Take 1 tablet by mouth daily.   Yes [provider]  nitroGLYCERIN (NITROSTAT) 0.4 MG SL tablet Place 0.4 mg under the tongue every 5 (five) minutes as needed for chest pain.   Yes [provider]  Nitroglycerin (RECTIV) 0.4 % OINT Place 1 application rectally 2 (two) times daily as needed (for chest pain.).    Yes [provider]  pantoprazole (PROTONIX) 40 MG tablet Take 40 mg by mouth daily.   Yes [provider]  pravastatin (PRAVACHOL) 20 MG tablet Take 20 mg by mouth at bedtime.   Yes [provider]  promethazine (PHENERGAN) 25 MG tablet Take  25 mg by mouth every 6 (six) hours as needed for nausea or vomiting.   Yes [provider]  ranitidine (ZANTAC) 300 MG tablet Take 300 mg by mouth at bedtime.   Yes [provider]  temazepam (RESTORIL) 7.5 MG capsule Take 7.5 mg by mouth at bedtime as needed for sleep.   Yes [provider]  traZODone (DESYREL) 50 MG tablet Take 50 mg by mouth at bedtime.   Yes [provider]  triamcinolone cream (KENALOG) 0.5 % Apply 1 application topically 2 (two) times daily as needed (for skin irritation).    Yes [provider]    Family History Family History  Problem Relation Age of Onset  . CAD Mother   . Cancer Father   . Stroke Brother     Social History Social History   Tobacco Use  . Smoking status: Former Games developermoker  . Smokeless tobacco: Never Used  Substance Use Topics  . Alcohol use: No  . Drug use: No     Allergies   Celebrex [celecoxib]; Ciprofloxacin; Cymbalta [duloxetine hcl]; Dilaudid [hydromorphone hcl]; Diuretic [buchu-cornsilk-ch grass-hydran]; Fentanyl and related; Glucophage [metformin hcl]; Lyrica [pregabalin]; Oxycodone; Morphine and related; Sulfa antibiotics; and Vicodin [hydrocodone-acetaminophen]   Review of Systems Review of Systems All other systems reviewed and are negative except that which was mentioned in HPI   Physical Exam Updated Vital Signs BP (!) 146/97   Pulse 82   Temp 97.9 F (36.6 C) (Oral)   Resp 16   Ht 5\' 4"  (1.626 m)   Wt 95.3 kg (210 lb)   SpO2 97%   BMI 36.05 kg/m   Physical Exam  Constitutional: She is oriented to person, place, and time. She appears well-developed and well-nourished. No distress.  Uncomfortable, spitting into cup  HENT:  Head: Normocephalic and atraumatic.  Moist mucous membranes  Eyes: Conjunctivae are normal.  Neck: Neck supple.  Cardiovascular: Normal rate, regular rhythm and normal heart sounds.  No murmur heard. Pulmonary/Chest: Effort normal and breath  sounds normal. No stridor.  Abdominal: Soft. Bowel sounds are normal. She exhibits no distension. There is no tenderness.  Musculoskeletal: She exhibits no edema.  Neurological: She is alert and oriented to person, place, and time.  Fluent speech  Skin: Skin is warm and dry.  Psychiatric: She has a normal mood and affect. Judgment normal.  Nursing note and vitals reviewed.    ED Treatments / Results  Labs (all labs ordered are listed, but only abnormal results are displayed) Labs Reviewed  GLUCOSE, CAPILLARY    EKG  EKG Interpretation None       Radiology No results found.  Procedures .Critical Care Performed by: Laurence SpatesLittle, Rachel Morgan, MD Authorized by: Laurence SpatesLittle, Rachel Morgan, MD   Critical care provider statement:    Critical care time (minutes):  30   Critical care was necessary to treat or prevent imminent or life-threatening deterioration of the following conditions: esophageal food impaction.   Critical care was time spent personally by me on the following activities:  Discussions with consultants, development of treatment plan with patient or surrogate, obtaining history from patient or surrogate, review of old charts and ordering and performing treatments and interventions   (including critical care time)  Medications Ordered in ED Medications  lactated ringers infusion ( Intravenous Anesthesia Volume Adjustment 02/11/17 1508)  ondansetron (ZOFRAN) injection 4 mg (4 mg Intravenous Given 02/11/17 1254)     Initial Impression / Assessment and Plan / ED Course  I have reviewed the triage vital signs and the nursing notes.       Pt w/ h/o food impaction p/w repeat food impaction since 8pm last night. She was spitting into cup on exam, protecting airway.  Given that it has been greater than 12 hours since her initial food impaction, I immediately contacted gastroenterology.  Patient to be evaluated by Dr. Rhea BeltonPyrtle and taken for endoscopy. Gave zofran and IV placed  prior to endoscopy.   Final Clinical Impressions(s) / ED Diagnoses   Final diagnoses:  None    ED Discharge Orders    None       Little, Ambrose Finlandachel Morgan, MD 02/11/17 [phone]

## 2017-02-11 NOTE — Transfer of Care (Signed)
Immediate Anesthesia Transfer of Care Note  Patient: Laurie [name]  Procedure(s) Performed: ESOPHAGOGASTRODUODENOSCOPY (EGD) WITH PROPOFOL (N/A )  Patient Location: PACU and Endoscopy Unit  Anesthesia Type:General  Level of Consciousness: awake, oriented and patient cooperative  Airway & Oxygen Therapy: Patient Spontanous Breathing and Patient connected to face mask oxygen  Post-op Assessment: Report given to RN and Post -op Vital signs reviewed and stable  Post vital signs: Reviewed and stable  Last Vitals:  Vitals:   02/11/17 1424 02/11/17 1508  BP: (!) 129/106 (!) 133/91  Pulse: 76 93  Resp: 14 14  Temp: 36.8 C   SpO2: 95% 99%    Last Pain:  Vitals:   02/11/17 1424  TempSrc: Oral  PainSc:          Complications: No apparent anesthesia complications

## 2017-02-11 NOTE — Consult Note (Signed)
Consultation  Referring Provider: Mechele Claude, RN [facility]) Primary Care Physician:  Margit Hanks, MD Primary Gastroenterologist:  Dr. Andrey Campanile ([facility])  Reason for Consultation: Acute dysphagia and concern for esophageal food impaction  HPI: Laurie [name] is a 60 y.o. female with a past medical history of GERD, recurrent esophageal food impactions, esophageal dysmotility, diabetes, diabetic gastropathy, IBS, hepatitis C, CAD and CKD who presents to the emergency department with acute dysphagia after eating dinner last night.  She has had a prior similar presentation in May 2018.  On that day she underwent upper endoscopy with Dr. Christella Hartigan.  There was a large meat bolus in the upper esophagus which was eventually removed.  There was esophagitis felt secondary to the food impaction.  She reports last night around 8 PM while eating collard greens and potato salad she developed acute dysphagia.  Since then she has been unable to tolerate her saliva and has been spitting her secretions.  She has a history of reflux which she states is usually controlled with pantoprazole in the morning and ranitidine at night.  She has had some nausea recently without vomiting.  No abdominal pain.  No chest pain.  No shortness of breath.  She has not seen Dr. Andrey Campanile in some months, she estimates 6-7.  Per CareEverywhere she was seen in early May 2018  Care everywhere was reviewed and she had a double contrast barium swallow performed on 07/28/2010 --this showed mild esophageal dysmotility, no hiatal hernia or reflux observed.  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  Past Medical History:  Diagnosis Date  . Anal fissure   . Asthma   . B12 deficiency   . Candidal esophagitis (HCC)   . Carpal tunnel syndrome   . Chronic abdominal pain   . Chronic UTI   . CKD (chronic kidney disease)   . Coronary artery disease   . Diabetes mellitus without  complication (HCC)   . Diabetic gastropathy (HCC)   . Episodic low back pain   . GERD (gastroesophageal reflux disease)   . GI bleed   . Glaucoma   . Hepatitis C   . Hypertension   . IBS (irritable bowel syndrome)   . Interstitial cystitis   . Iron deficiency anemia   . Myofascial pain syndrome   . Obesity (BMI 35.0-39.9 without comorbidity)   . Plantar fasciitis     Past Surgical History:  Procedure Laterality Date  . ABDOMINAL HYSTERECTOMY    . BILATERAL CARPAL TUNNEL RELEASE    . BREAST SURGERY    . CHOLECYSTECTOMY    . COLONOSCOPY    . ESOPHAGOGASTRODUODENOSCOPY    . REVISION UROSTOMY CUTANEOUS      Prior to Admission medications   Medication Sig Start Date End Date Taking? Authorizing Provider  albuterol (PROVENTIL HFA;VENTOLIN HFA) 108 (90 BASE) MCG/ACT inhaler Inhale 2 puffs into the lungs every 4 (four) hours as needed for wheezing or shortness of breath.   Yes [provider]  amLODipine (NORVASC) 10 MG tablet Take 10 mg by mouth daily.    Yes [provider]  aspirin EC 81 MG tablet Take 81 mg by mouth daily.   Yes [provider]  budesonide-formoterol (SYMBICORT) 80-4.5 MCG/ACT inhaler Inhale 2 puffs into the lungs 2 (two) times daily.   Yes [provider]  cholecalciferol (VITAMIN D) 1000 UNITS tablet Take 1,000 Units by mouth daily.   Yes [provider]  colestipol (COLESTID) 1 G tablet Take 1 g by mouth daily.    Yes [provider]  cyanocobalamin 1000 MCG tablet Take 1,000 mcg by mouth daily.    Yes [provider]  dicyclomine (BENTYL) 20 MG tablet Take 20 mg 2 (two) times daily by mouth. 08/30/16 08/30/17 Yes [provider]  FLUoxetine (PROZAC) 20 MG capsule Take 20 mg by mouth daily.    Yes [provider]  fluticasone (FLONASE) 50 MCG/ACT nasal spray Place 2 sprays into both nostrils daily as needed for allergies or rhinitis.   Yes [provider]  gabapentin  (NEURONTIN) 300 MG capsule Take 600 mg by mouth 2 (two) times daily.   Yes [provider]  glucagon (GLUCAGON EMERGENCY) 1 MG injection Inject 1 mg into the vein once as needed (severe insulin reaction).   Yes [provider]  hydrocortisone (ANUSOL-HC) 2.5 % rectal cream Place 1 application rectally 3 (three) times daily as needed for hemorrhoids.    Yes [provider]  Insulin Glargine (BASAGLAR KWIKPEN) 100 UNIT/ML SOPN Inject 50 Units into the skin at bedtime. 08/01/16 08/01/17 Yes [provider]  isosorbide mononitrate (IMDUR) 60 MG 24 hr tablet Take 60 mg by mouth daily.   Yes [provider]  lidocaine (XYLOCAINE) 5 % ointment Apply 1 application topically 4 (four) times daily as needed for moderate pain.   Yes [provider]  LIRAGLUTIDE [place] Inject 1.8 mg into the skin daily.   Yes [provider]  lisinopril (PRINIVIL,ZESTRIL) 2.5 MG tablet Take 2.5 mg by mouth daily. 08/02/16  Yes [provider]  magnesium oxide (MAG-OX) 400 MG tablet Take 400 mg by mouth 2 (two) times daily.   Yes [provider]  metoprolol succinate (TOPROL-XL) 100 MG 24 hr tablet Take 100 mg by mouth daily. Take with or immediately following a meal.   Yes [provider]  Multiple Vitamin (MULTIVITAMIN WITH MINERALS) TABS tablet Take 1 tablet by mouth daily.   Yes [provider]  nitroGLYCERIN (NITROSTAT) 0.4 MG SL tablet Place 0.4 mg under the tongue every 5 (five) minutes as needed for chest pain.   Yes [provider]  Nitroglycerin (RECTIV) 0.4 % OINT Place 1 application rectally 2 (two) times daily as needed (for chest pain.).    Yes [provider]  pantoprazole (PROTONIX) 40 MG tablet Take 40 mg by mouth daily.   Yes [provider]  pravastatin (PRAVACHOL) 20 MG tablet Take 20 mg by mouth at bedtime.   Yes [provider]  promethazine (PHENERGAN) 25 MG tablet Take 25 mg by  mouth every 6 (six) hours as needed for nausea or vomiting.   Yes [provider]  ranitidine (ZANTAC) 300 MG tablet Take 300 mg by mouth at bedtime.   Yes [provider]  temazepam (RESTORIL) 7.5 MG capsule Take 7.5 mg by mouth at bedtime as needed for sleep.   Yes [provider]  traZODone (DESYREL) 50 MG tablet Take 50 mg by mouth at bedtime.   Yes [provider]  triamcinolone cream (KENALOG) 0.5 % Apply 1 application topically 2 (two) times daily as needed (for skin irritation).    Yes [provider]    No current facility-administered medications for this encounter.     Allergies as of 02/11/2017 - Review Complete 02/11/2017  Allergen Reaction Noted  . Celebrex [celecoxib] Itching 12/28/2014  . Ciprofloxacin Other (See Comments) 12/28/2014  . Cymbalta [duloxetine hcl]  12/28/2014  .  Dilaudid [hydromorphone hcl] Itching 12/28/2014  . Diuretic [buchu-cornsilk-ch grass-hydran] Other (See Comments) 12/28/2014  . Fentanyl and related Itching 06/24/2015  . Glucophage [metformin hcl] Other (See Comments) 12/28/2014  . Lyrica [pregabalin] Other (See Comments) 12/28/2014  . Oxycodone Itching 12/28/2014  . Morphine and related Rash 12/28/2014  . Sulfa antibiotics Rash 12/28/2014  . Vicodin [hydrocodone-acetaminophen] Rash 12/28/2014    Family History  Problem Relation Age of Onset  . CAD Mother   . Cancer Father   . Stroke Brother     Social History   Socioeconomic History  . Marital status: Widowed    Spouse name: Not on file  . Number of children: Not on file  . Years of education: Not on file  . Highest education level: Not on file  Social Needs  . Financial resource strain: Not on file  . Food insecurity - worry: Not on file  . Food insecurity - inability: Not on file  . Transportation needs - medical: Not on file  . Transportation needs - non-medical: Not on file  Occupational History  . Not on file  Tobacco Use  .  Smoking status: Former Games developer  . Smokeless tobacco: Never Used  Substance and Sexual Activity  . Alcohol use: No  . Drug use: No  . Sexual activity: Not on file  Other Topics Concern  . Not on file  Social History Narrative  . 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  Esophageal food impaction --plan urgent endoscopy with general anesthesia this afternoon. The nature of the procedure, as well as the risks, benefits, and alternatives were carefully and thoroughly reviewed with the patient. Ample time for discussion and questions allowed. The patient understood, was satisfied, and agreed to proceed.  --We discussed the possibility of dilation which may be performed today depending on findings at endoscopy.  I explained that if there is severe esophagitis dilation would likely need to be done after several weeks to allow time for esophageal mucosal healing prior to dilation. --She prefers to follow-up with Dr. Andrey Campanile at [facility] on discharge       Laurie [name]  02/11/2017, 2:23 PM

## 2017-02-11 NOTE — Op Note (Signed)
[facility] Patient Name: Laurie [name] Procedure Date: 02/11/2017 MRN: [id number] Attending MD: Beverley Fiedler , MD Date of Birth: February 02, 1956 CSN: [id number] Age: 61 Admit Type: Outpatient Procedure:                Upper GI endoscopy Indications:              Foreign body in the esophagus Providers:                Carie Caddy. Rhea Belton, MD, Priscella Mann RN, RN, Oletha Blend, Technician Referring MD:             [facility] Medicines:                General Anesthesia Complications:            No immediate complications. Estimated Blood Loss:     Estimated blood loss: none. Procedure:                Pre-Anesthesia Assessment:                           - Prior to the procedure, a History and Physical                            was performed, and patient medications and                            allergies were reviewed. The patient's tolerance of                            previous anesthesia was also reviewed. The risks                            and benefits of the procedure and the sedation                            options and risks were discussed with the patient.                            All questions were answered, and informed consent                            was obtained. Prior Anticoagulants: The patient has                            taken no previous anticoagulant or antiplatelet                            agents. ASA Grade Assessment: III - A patient with                            severe systemic disease. After reviewing the risks  and benefits, the patient was deemed in                            satisfactory condition to undergo the procedure.                           After obtaining informed consent, the endoscope was                            passed under direct vision. Throughout the                            procedure, the patient's blood pressure, pulse, and      oxygen saturations were monitored continuously. The                            was introduced through the mouth, and advanced to                            the second part of duodenum. The upper GI endoscopy                            was accomplished without difficulty. The patient                            tolerated the procedure well. Scope In: Scope Out: Findings:      LA Grade D (one or more mucosal breaks involving at least 75% of       esophageal circumference) esophagitis with no bleeding was found 20 to       24 cm from the incisors. This is likely due to recent esophageal food       impaction. In the middle esophagus there was a linear ulceration also       likely related to recent food impaction. No obvious strictures seen. At       time of esophageal inspection the food had already spontaneously moved       into the proximal stomach (perhaps during intubation and induction of       anesthesia). Dilation not performed due to the esophagitis.      Food (residue) was found in the gastric body. This was tubular shaped       and consistent with prior esophageal meat impaction.      The exam of the stomach was otherwise normal.      The examined duodenum was normal. Impression:               - LA Grade D acute esophagitis.                           - Food (residue) in the stomach.                           - Normal examined duodenum.                           - No specimens collected. Moderate Sedation:      N/A Recommendation:           -  Patient has a contact number available for                            emergencies. The signs and symptoms of potential                            delayed complications were discussed with the                            patient. Return to normal activities tomorrow.                            Written discharge instructions were provided to the                            patient.                           - Resume previous diet.                            - Continue present medications including                            pantoprazole 40 mg each morning and ranitidine 150                            mg each evening.                           - Full liquid diet today, advance to soft tomorrow.                           - Follow-up with Dr. Andrey Campanile with [facility] to discuss                            further treatment and possible esophageal dilation. Procedure Code(s):        --- Professional ---                           [phone], Esophagogastroduodenoscopy, flexible,                            transoral; diagnostic, including collection of                            specimen(s) by brushing or washing, when performed                            (separate procedure) Diagnosis Code(s):        --- Professional ---                           K20.9, Esophagitis, unspecified  T18.108A, Unspecified foreign body in esophagus                            causing other injury, initial encounter CPT copyright 2016 American [employer]. All rights reserved. The codes documented in this report are preliminary and upon coder review may  be revised to meet current compliance requirements. Beverley Fiedler, MD 02/11/2017 3:24:34 PM This report has been signed electronically. Number of Addenda: 0

## 2017-02-11 NOTE — Anesthesia Preprocedure Evaluation (Signed)
Anesthesia Evaluation  Patient identified by MRN, date of birth, ID band Patient awake    Reviewed: Allergy & Precautions, H&P , NPO status , Patient's Chart, lab work & pertinent test results, reviewed documented beta blocker date and time   Airway Mallampati: I  TM Distance: >3 FB Neck ROM: Full    Dental no notable dental hx. (+) Edentulous Upper   Pulmonary asthma , former smoker,    Pulmonary exam normal breath sounds clear to auscultation       Cardiovascular hypertension, + CAD   Rhythm:Regular Rate:Normal     Neuro/Psych    GI/Hepatic GERD  ,(+) Hepatitis -  Endo/Other  diabetes  Renal/GU Renal disease     Musculoskeletal   Abdominal   Peds  Hematology   Anesthesia Other Findings   Reproductive/Obstetrics                             Anesthesia Physical  Anesthesia Plan  ASA: III  Anesthesia Plan:    Post-op Pain Management:    Induction: Intravenous  PONV Risk Score and Plan: 2 and Ondansetron  Airway Management Planned: Oral ETT  Additional Equipment:   Intra-op Plan:   Post-operative Plan: Extubation in OR  Informed Consent: I have reviewed the patients History and Physical, chart, labs and discussed the procedure including the risks, benefits and alternatives for the proposed anesthesia with the patient or authorized representative who has indicated his/her understanding and acceptance.   Dental Advisory Given  Plan Discussed with: CRNA  Anesthesia Plan Comments:         Anesthesia Quick Evaluation

## 2017-02-11 NOTE — ED Notes (Signed)
Pt discharged from Endo

## 2017-02-11 NOTE — ED Notes (Signed)
Spoke with Laurie [name], from Endoscopy regarding patient's plan of care. They will be coming to get patient in approximately 1 hr. Will be discharging patient from their department.

## 2017-02-11 NOTE — ED Notes (Signed)
Patient transported to Endoscopy with department tech.

## 2017-02-11 NOTE — Discharge Instructions (Addendum)

## 2017-02-11 NOTE — ED Notes (Signed)
Spoke with endoscopy regarding

## 2017-02-11 NOTE — Anesthesia Procedure Notes (Signed)
Procedure Name: Intubation Date/Time: 02/11/2017 2:43 PM Performed by: Elyn PeersAllen, Lamont Tant J, CRNA Pre-anesthesia Checklist: Patient identified, Emergency Drugs available, Suction available, Patient being monitored and Timeout performed Patient Re-evaluated:Patient Re-evaluated prior to induction Oxygen Delivery Method: Circle system utilized Preoxygenation: Pre-oxygenation with 100% oxygen Induction Type: IV induction, Rapid sequence and Cricoid Pressure applied Laryngoscope Size: Miller and 2 Grade View: Grade I Tube type: Oral Number of attempts: 1 Airway Equipment and Method: Stylet Placement Confirmation: ETT inserted through vocal cords under direct vision,  positive ETCO2 and breath sounds checked- equal and bilateral Secured at: 22 cm Tube secured with: Tape Dental Injury: Teeth and Oropharynx as per pre-operative assessment

## 2017-02-12 ENCOUNTER — Encounter (HOSPITAL_COMMUNITY): Payer: Self-pay | Admitting: Internal Medicine

## 2017-02-12 NOTE — Anesthesia Postprocedure Evaluation (Signed)
Anesthesia Post Note  Patient: Laurie [name]  Procedure(s) Performed: ESOPHAGOGASTRODUODENOSCOPY (EGD) WITH PROPOFOL (N/A )     Patient location during evaluation: PACU Anesthesia Type: General Level of consciousness: awake and alert Pain management: pain level controlled Vital Signs Assessment: post-procedure vital signs reviewed and stable Respiratory status: spontaneous breathing, nonlabored ventilation, respiratory function stable and patient connected to nasal cannula oxygen Cardiovascular status: blood pressure returned to baseline and stable Postop Assessment: no apparent nausea or vomiting Anesthetic complications: no    Last Vitals:  Vitals:   02/11/17 1530 02/11/17 1540  BP: (!) 162/84 (!) 146/97  Pulse: 86 82  Resp: 20 16  Temp:    SpO2: 95% 97%    Last Pain:  Vitals:   02/11/17 1508  TempSrc: Oral  PainSc:                  Jiles GarterJACKSON,Emmauel Hallums EDWARD

## 2017-02-12 NOTE — Anesthesia Postprocedure Evaluation (Deleted)
Anesthesia Post Note  Patient: Laurie [name]  Procedure(s) Performed: ESOPHAGOGASTRODUODENOSCOPY (EGD) WITH PROPOFOL (N/A )     Patient location during evaluation: PACU Anesthesia Type: MAC Level of consciousness: awake and alert Pain management: pain level controlled Vital Signs Assessment: post-procedure vital signs reviewed and stable Respiratory status: spontaneous breathing, nonlabored ventilation, respiratory function stable and patient connected to nasal cannula oxygen Cardiovascular status: stable and blood pressure returned to baseline Postop Assessment: no apparent nausea or vomiting Anesthetic complications: no    Last Vitals:  Vitals:   02/11/17 1530 02/11/17 1540  BP: (!) 162/84 (!) 146/97  Pulse: 86 82  Resp: 20 16  Temp:    SpO2: 95% 97%    Last Pain:  Vitals:   02/11/17 1508  TempSrc: Oral  PainSc:                  Riccardo Dubin

## 2017-02-15 ENCOUNTER — Emergency Department (HOSPITAL_COMMUNITY): Payer: Medicare Other

## 2017-02-15 ENCOUNTER — Encounter (HOSPITAL_COMMUNITY): Payer: Self-pay | Admitting: Emergency Medicine

## 2017-02-15 ENCOUNTER — Other Ambulatory Visit: Payer: Self-pay

## 2017-02-15 ENCOUNTER — Emergency Department (HOSPITAL_COMMUNITY)
Admission: EM | Admit: 2017-02-15 | Discharge: 2017-02-15 | Disposition: A | Discharge status: Home / Self Care | Payer: Medicare Other | Attending: Emergency Medicine | Admitting: Emergency Medicine

## 2017-02-15 DIAGNOSIS — Z87891 Personal history of nicotine dependence: Secondary | ICD-10-CM | POA: Insufficient documentation

## 2017-02-15 DIAGNOSIS — Z794 Long term (current) use of insulin: Secondary | ICD-10-CM | POA: Diagnosis not present

## 2017-02-15 DIAGNOSIS — M25511 Pain in right shoulder: Secondary | ICD-10-CM | POA: Diagnosis present

## 2017-02-15 DIAGNOSIS — N182 Chronic kidney disease, stage 2 (mild): Secondary | ICD-10-CM | POA: Insufficient documentation

## 2017-02-15 DIAGNOSIS — W19XXXA Unspecified fall, initial encounter: Secondary | ICD-10-CM

## 2017-02-15 DIAGNOSIS — I129 Hypertensive chronic kidney disease with stage 1 through stage 4 chronic kidney disease, or unspecified chronic kidney disease: Secondary | ICD-10-CM | POA: Insufficient documentation

## 2017-02-15 DIAGNOSIS — M545 Low back pain, unspecified: Secondary | ICD-10-CM

## 2017-02-15 DIAGNOSIS — Z7982 Long term (current) use of aspirin: Secondary | ICD-10-CM | POA: Diagnosis not present

## 2017-02-15 DIAGNOSIS — I2510 Atherosclerotic heart disease of native coronary artery without angina pectoris: Secondary | ICD-10-CM | POA: Insufficient documentation

## 2017-02-15 DIAGNOSIS — E1122 Type 2 diabetes mellitus with diabetic chronic kidney disease: Secondary | ICD-10-CM | POA: Insufficient documentation

## 2017-02-15 DIAGNOSIS — J45909 Unspecified asthma, uncomplicated: Secondary | ICD-10-CM | POA: Insufficient documentation

## 2017-02-15 DIAGNOSIS — Z79899 Other long term (current) drug therapy: Secondary | ICD-10-CM | POA: Insufficient documentation

## 2017-02-15 IMAGING — CR DG LUMBAR SPINE COMPLETE 4+V
5 series · 5 of 5 positions shown · non-contrast
Comparison: None.

CLINICAL DATA: Right-sided pain after trauma/ fall.

EXAM:
LUMBAR SPINE - COMPLETE 4+ VIEW

[t lumbar spine ap]
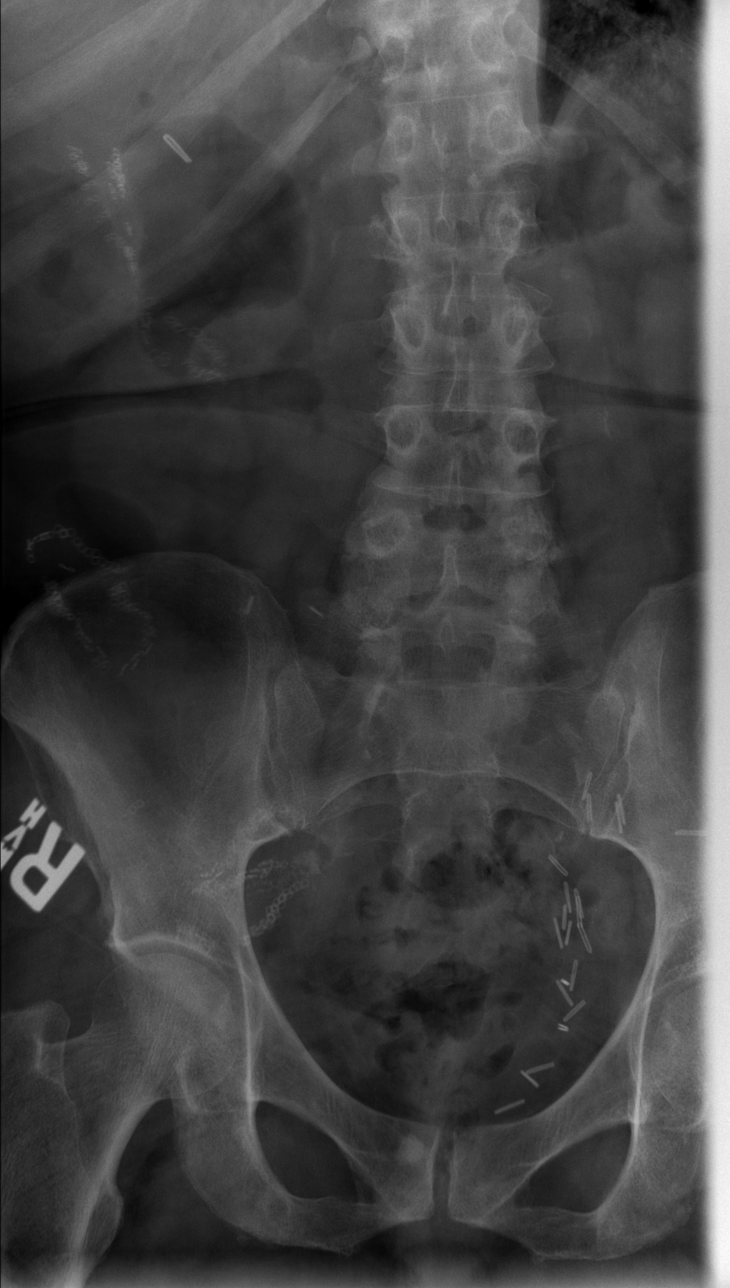

[t lumbar spine obl (1 of 2)]
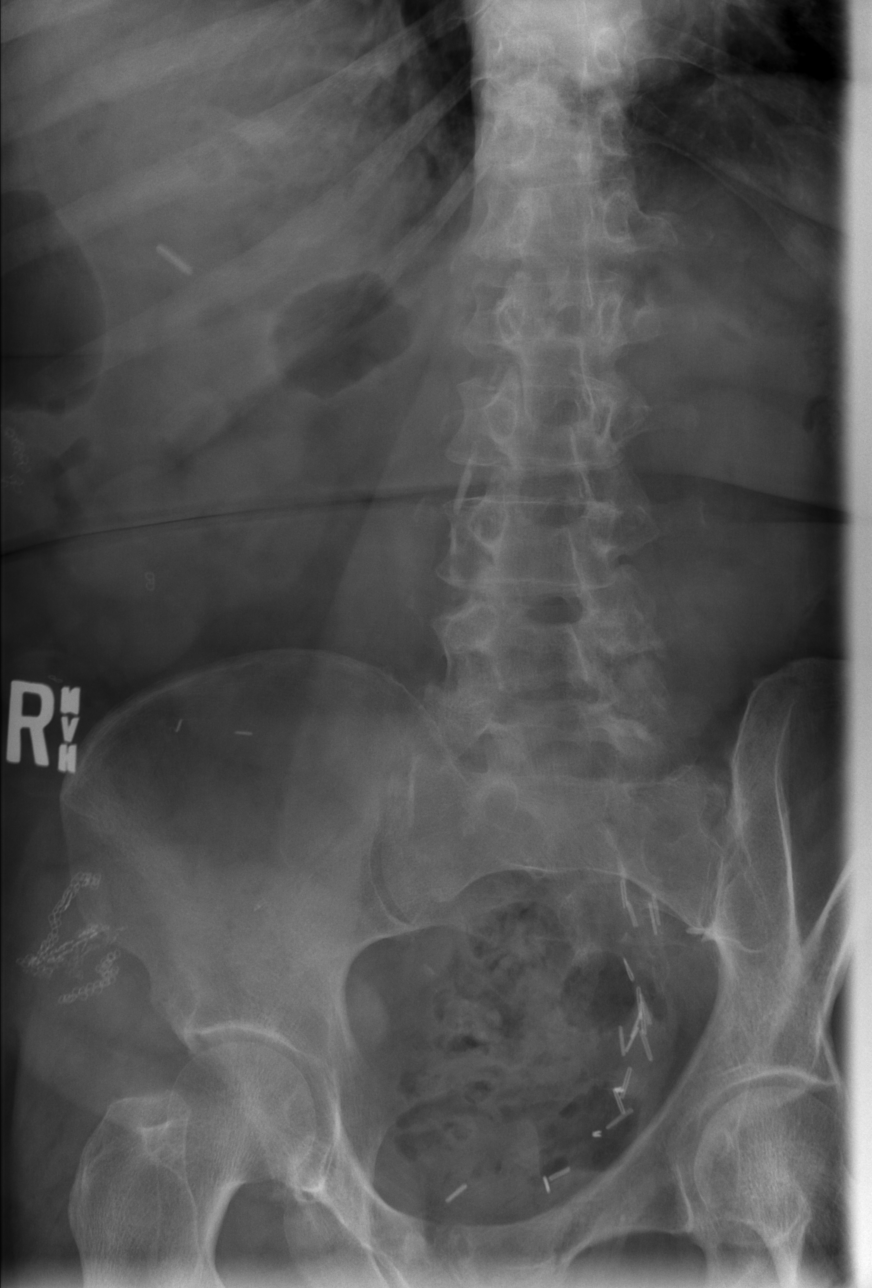

[t lumbar spine obl (2 of 2)]
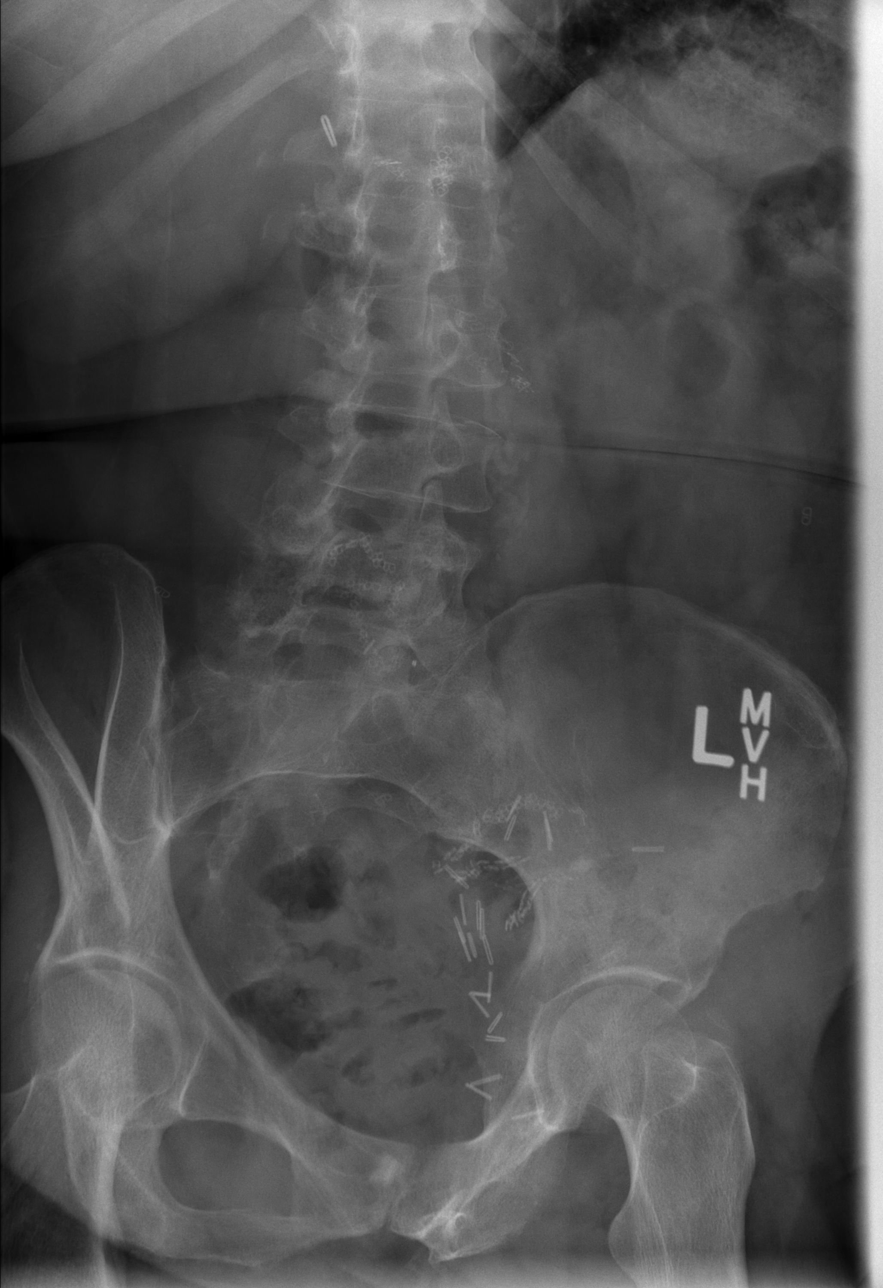

[t lumbar spine lat]
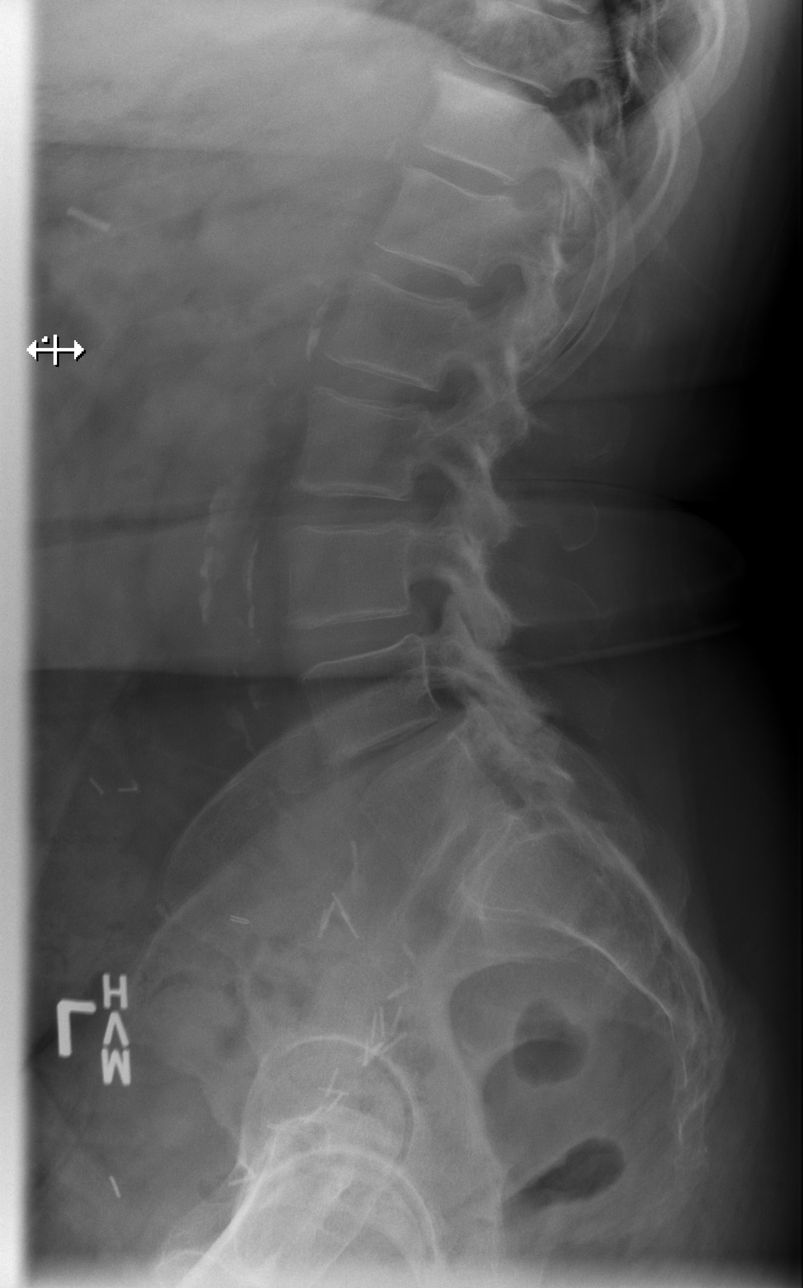

[t lumbar l-5 s-1 spot]
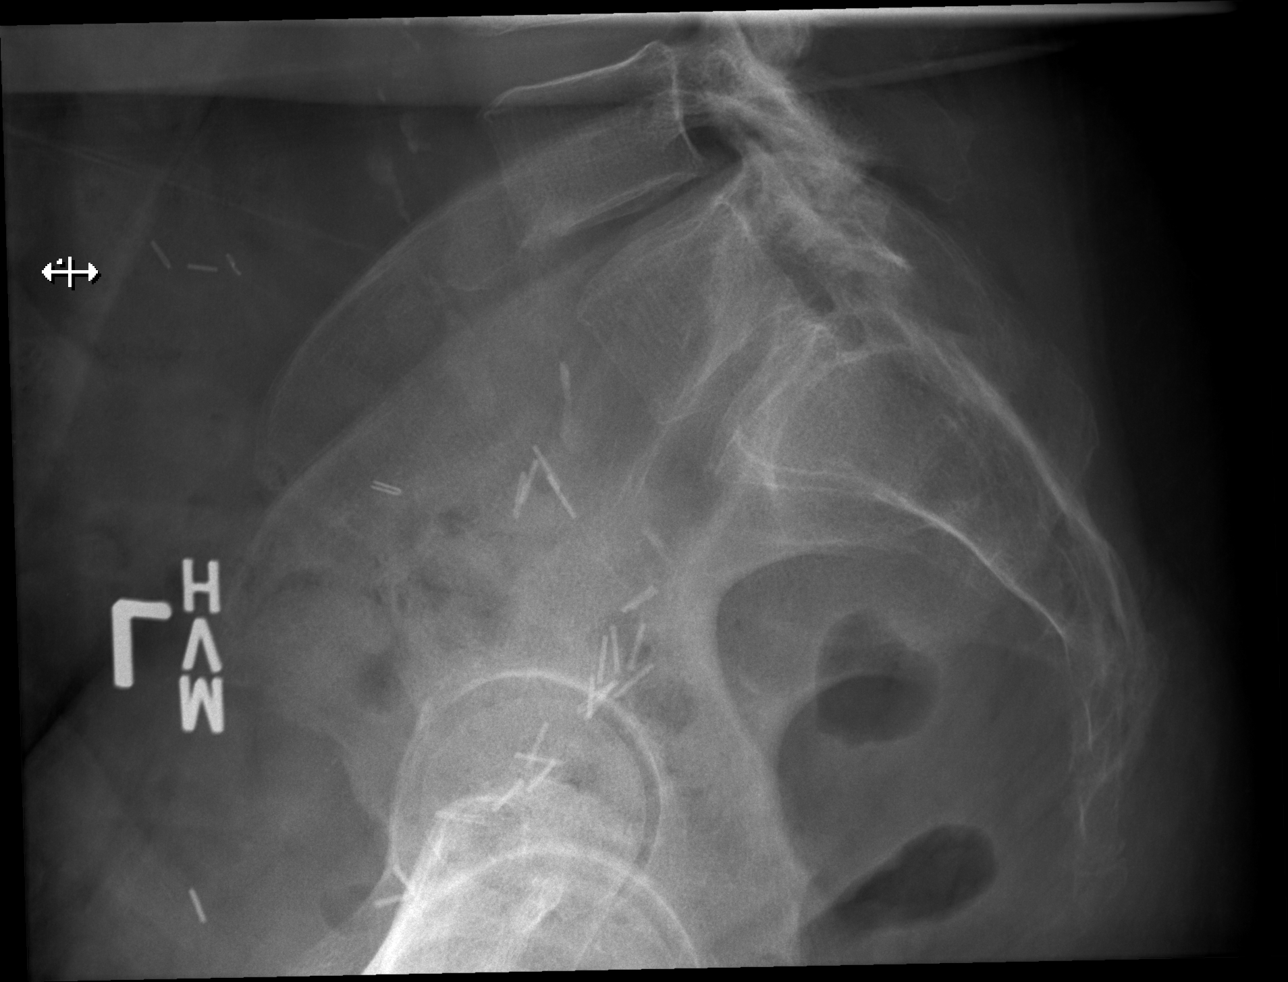

[5 of 5 positions shown; findings below may reference images not displayed]

FINDINGS: Postsurgical changes seen in the right abdomen and left pelvis.
Rounded increased density projected over the right side of the pubic
symphysis is unchanged since the CT scan from [DATE], likely
a bone island. No traumatic malalignment. No fracture. Mild
degenerative changes seen in the lower thoracic spine in the lower
lumbar facets. Calcified atherosclerosis identified in the aorta.
IMPRESSION: No acute abnormality.  Degenerative changes.

## 2017-02-15 IMAGING — CR DG RIBS W/ CHEST 3+V*R*
5 series · 5 of 5 positions shown · non-contrast
Comparison: Chest x-ray [DATE]

CLINICAL DATA: Pain after fall

EXAM:
RIGHT RIBS AND CHEST - 3+ VIEW

[w chest pa]
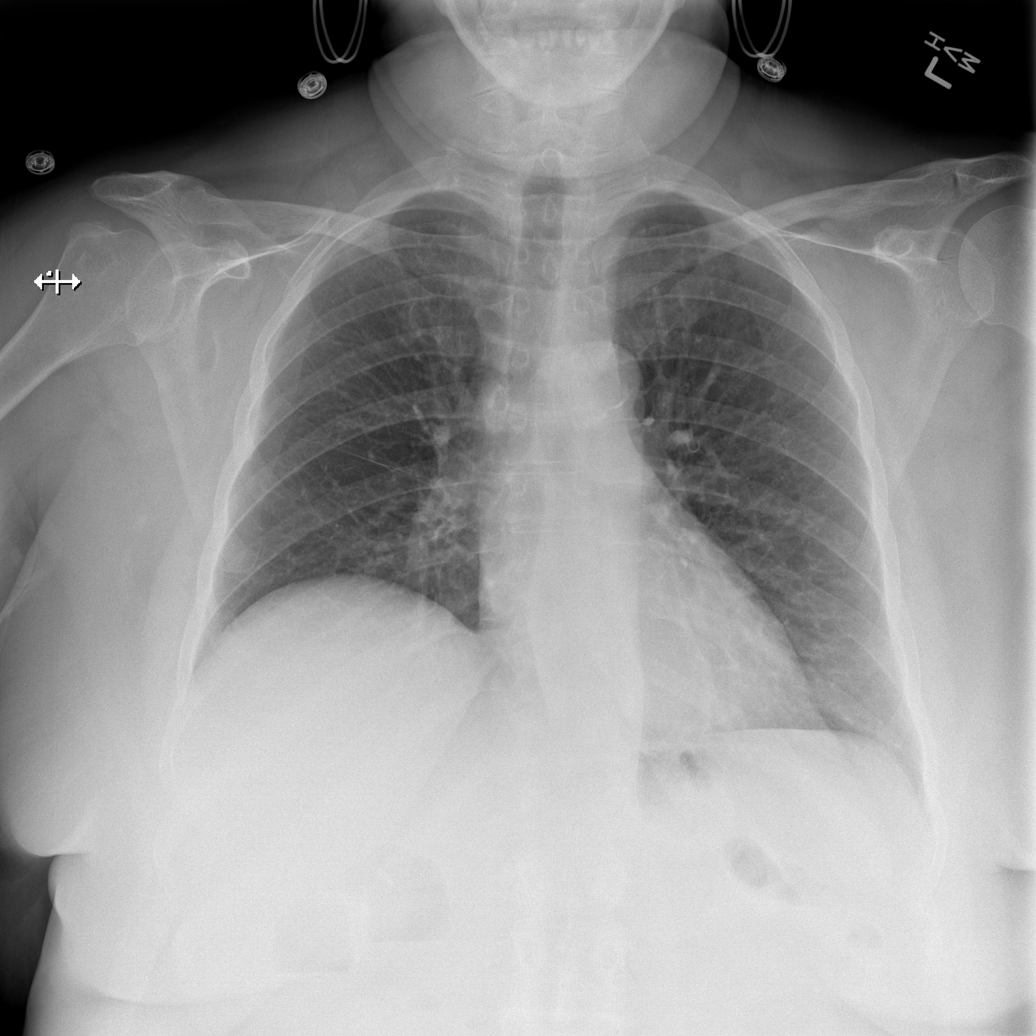

[w ribs ap upper right]
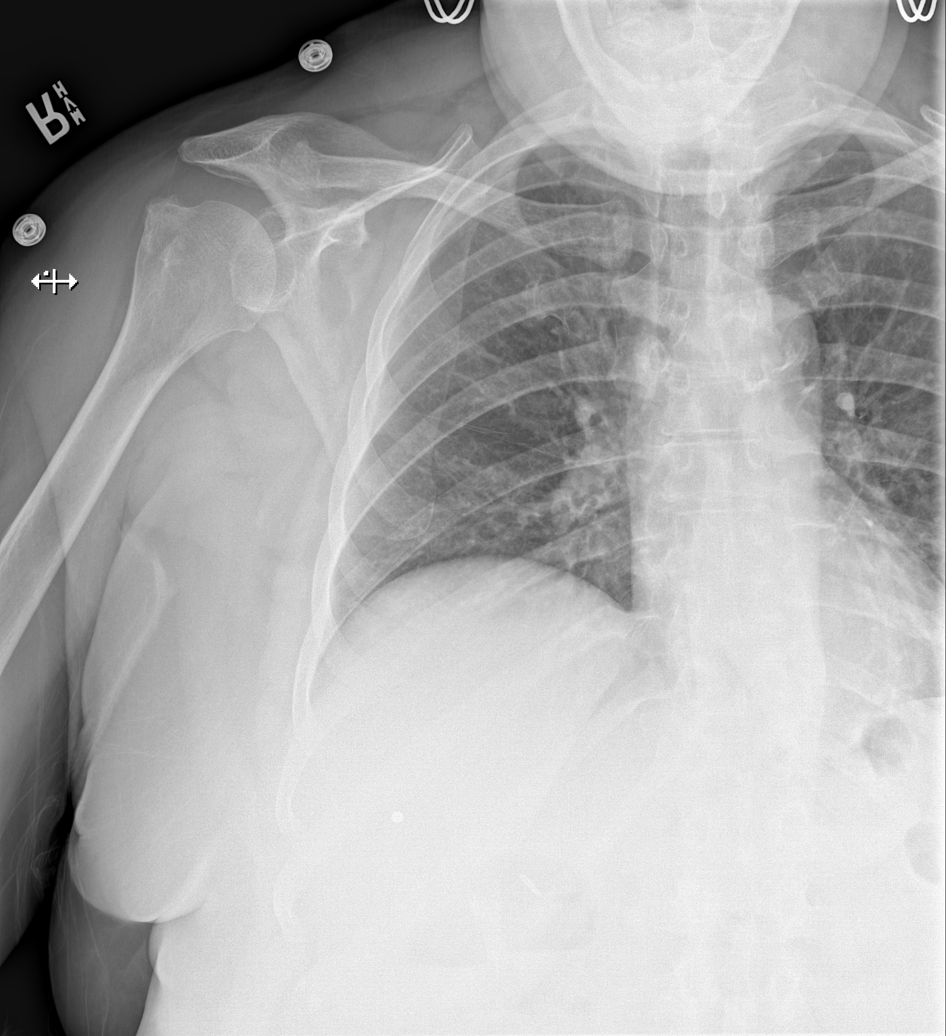

[w ribs ap lower right]
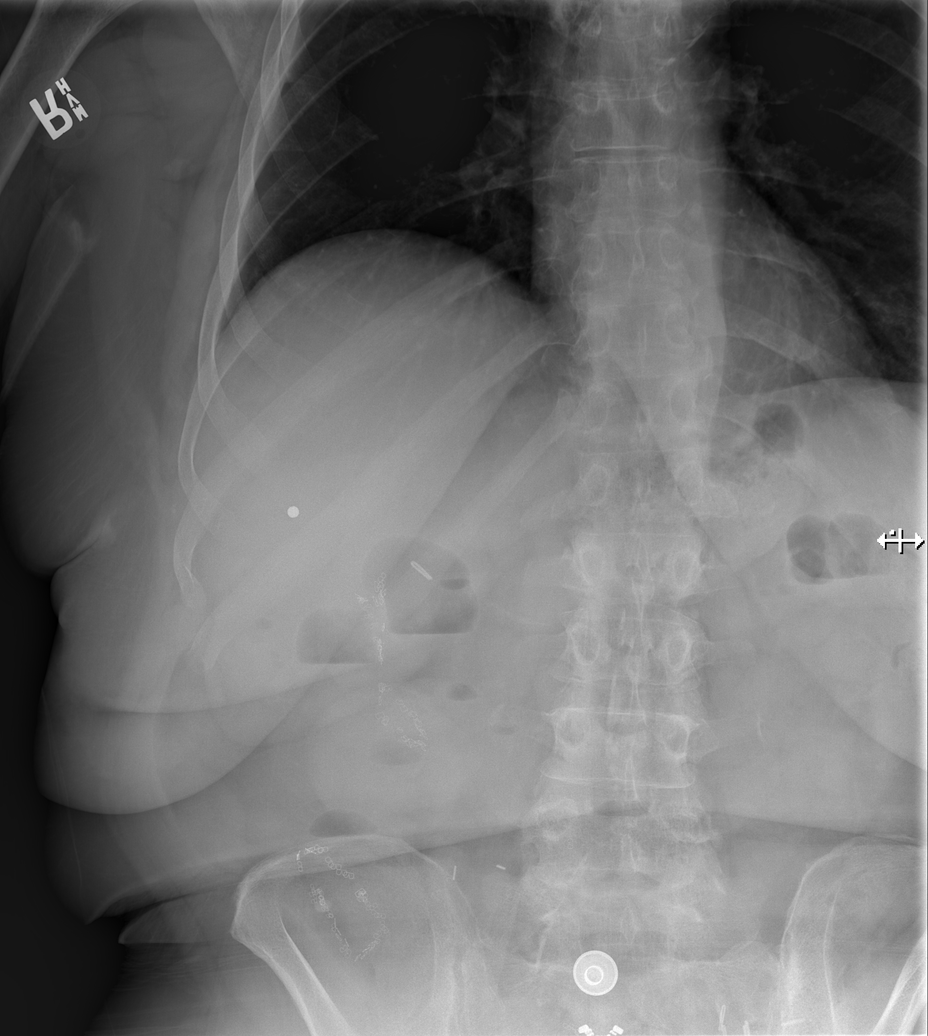

[w ribs obl right (1 of 2)]
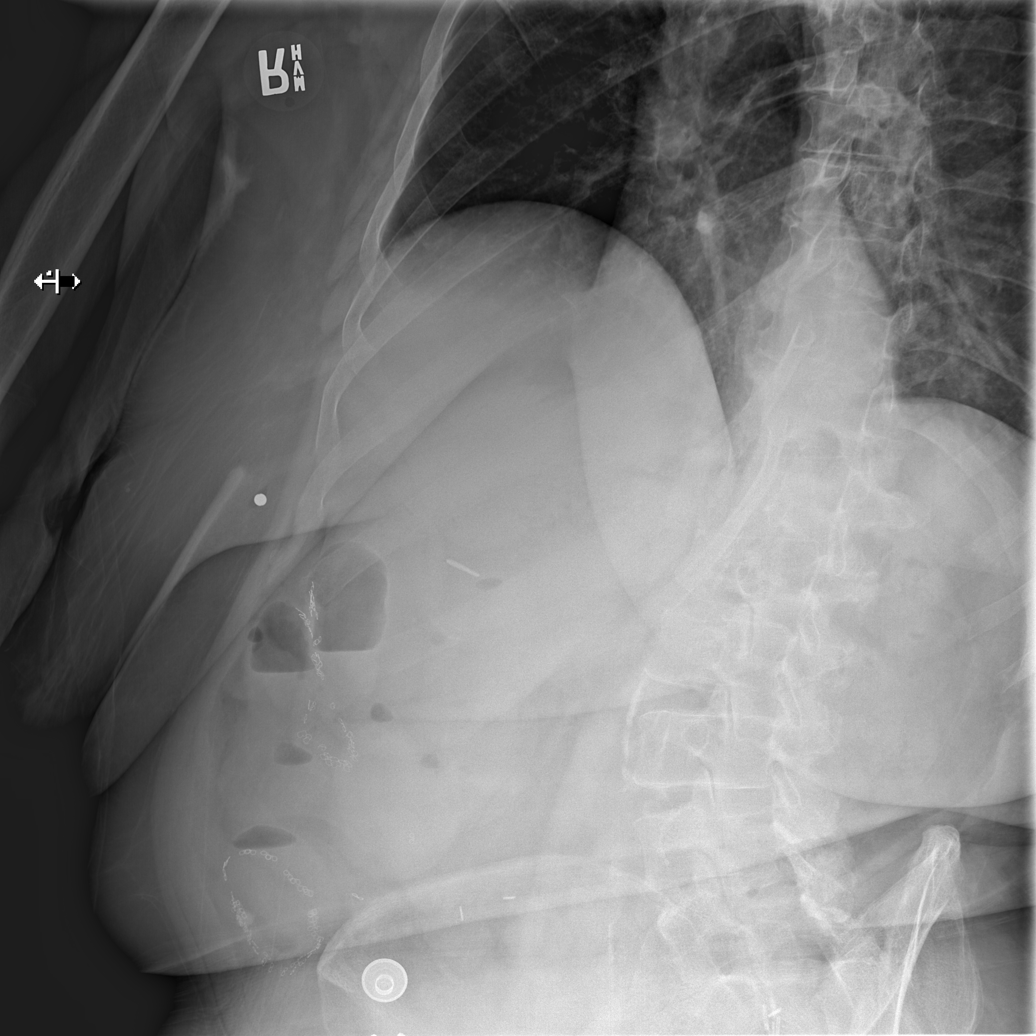

[w ribs obl right (2 of 2)]
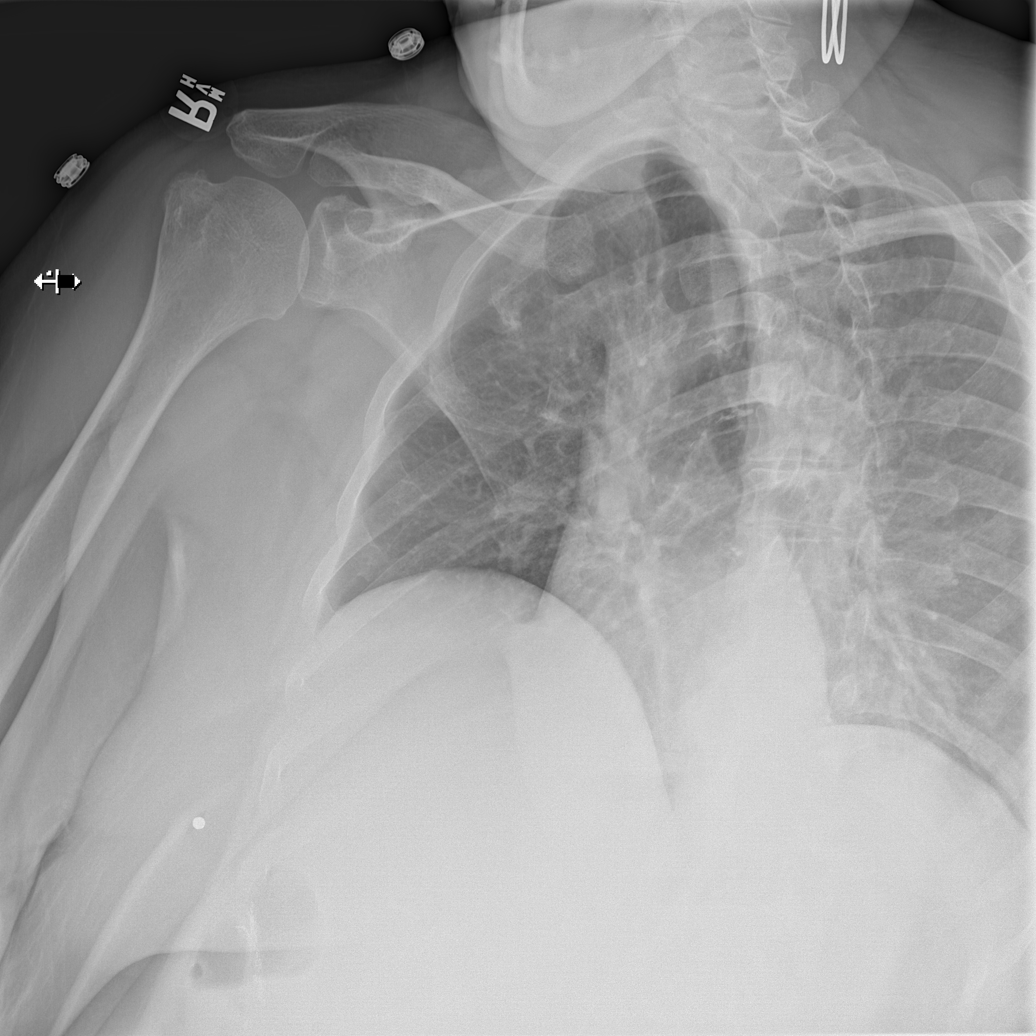

[5 of 5 positions shown; findings below may reference images not displayed]

FINDINGS: The elevated right hemidiaphragm is stable. The heart, hila,
mediastinum, lungs, and pleura are unremarkable.

No rib fractures identified.
IMPRESSION: No acute abnormalities.

## 2017-02-15 IMAGING — CR DG SHOULDER 2+V*R*
3 series · 3 of 3 positions shown · non-contrast
Comparison: None.

CLINICAL DATA: Pain after fall

EXAM:
RIGHT SHOULDER - 2+ VIEW

[w shoulder external right]
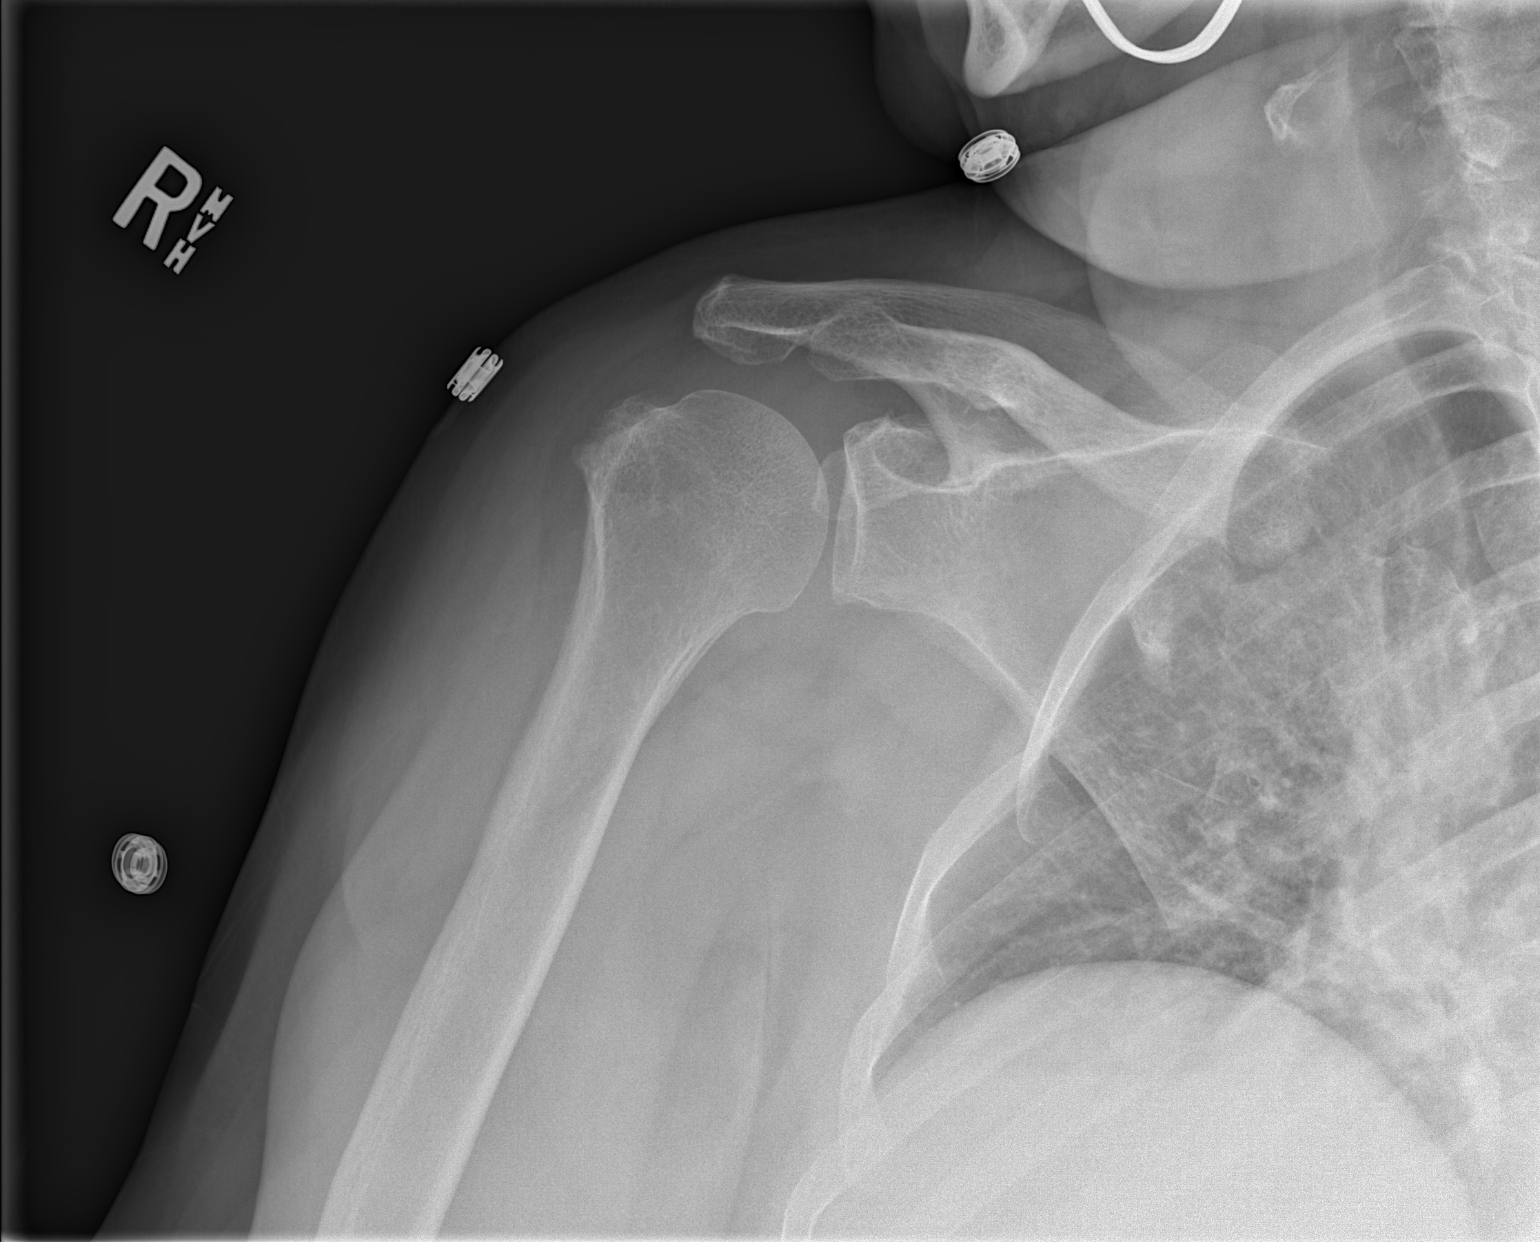

[w shoulder y-view right]
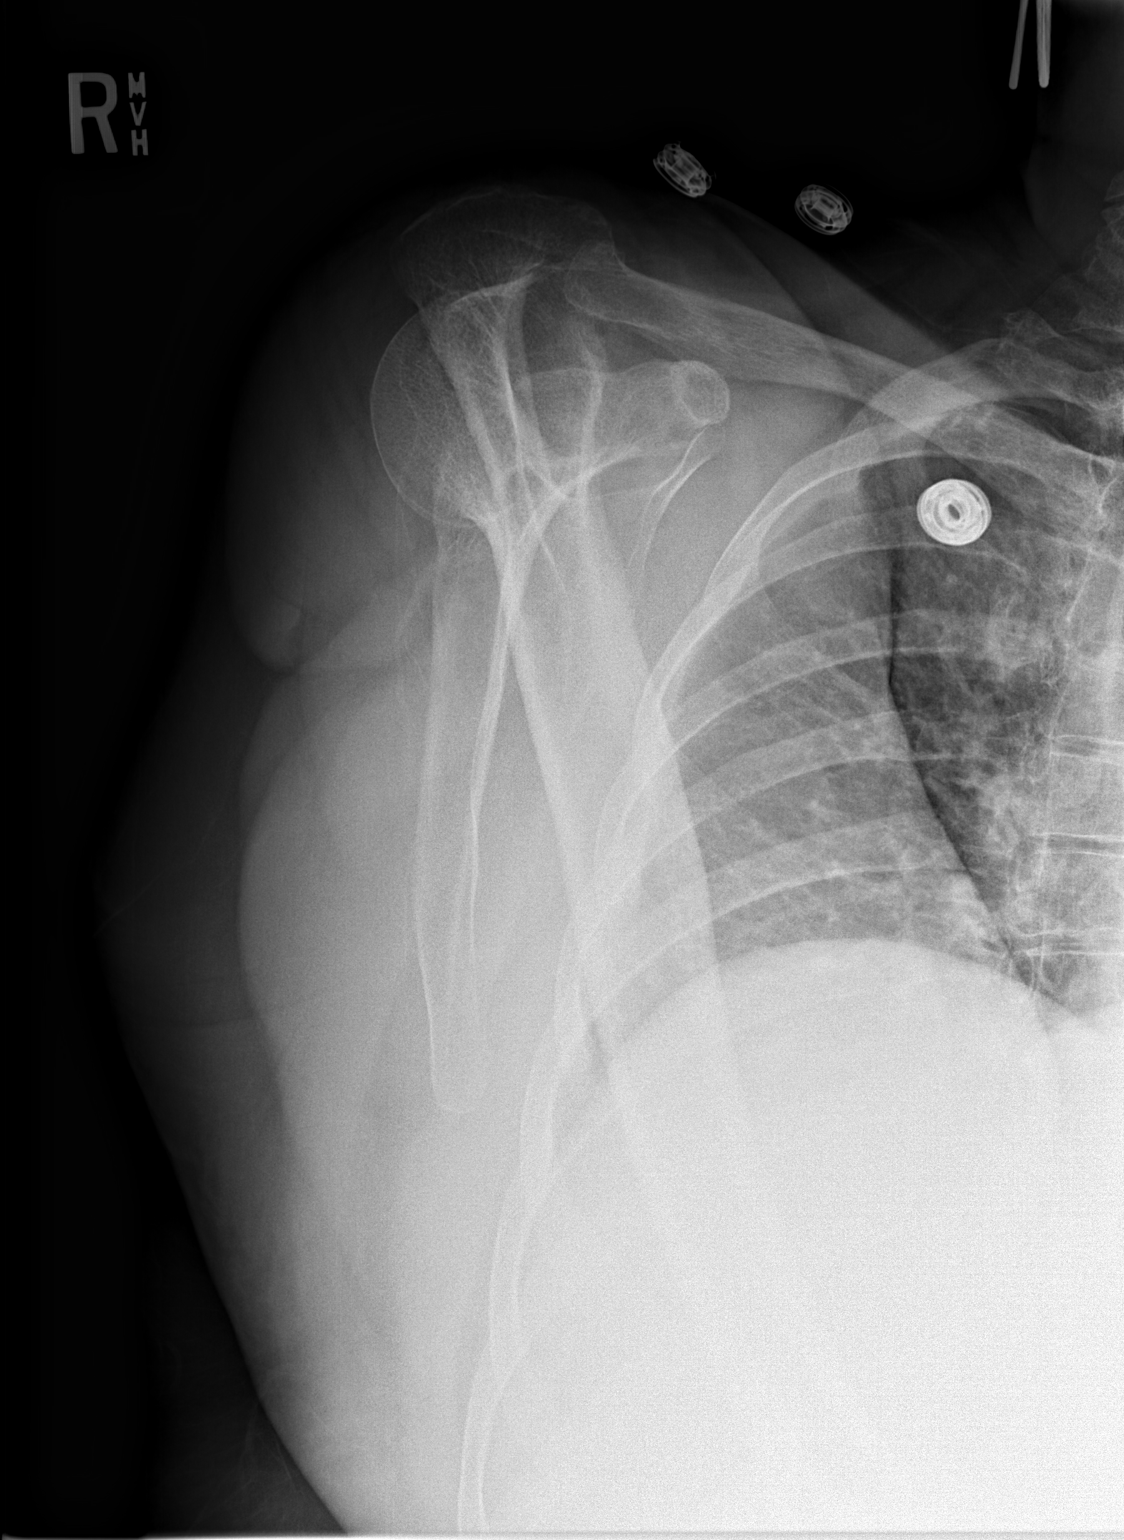

[x shoulder axillary right]
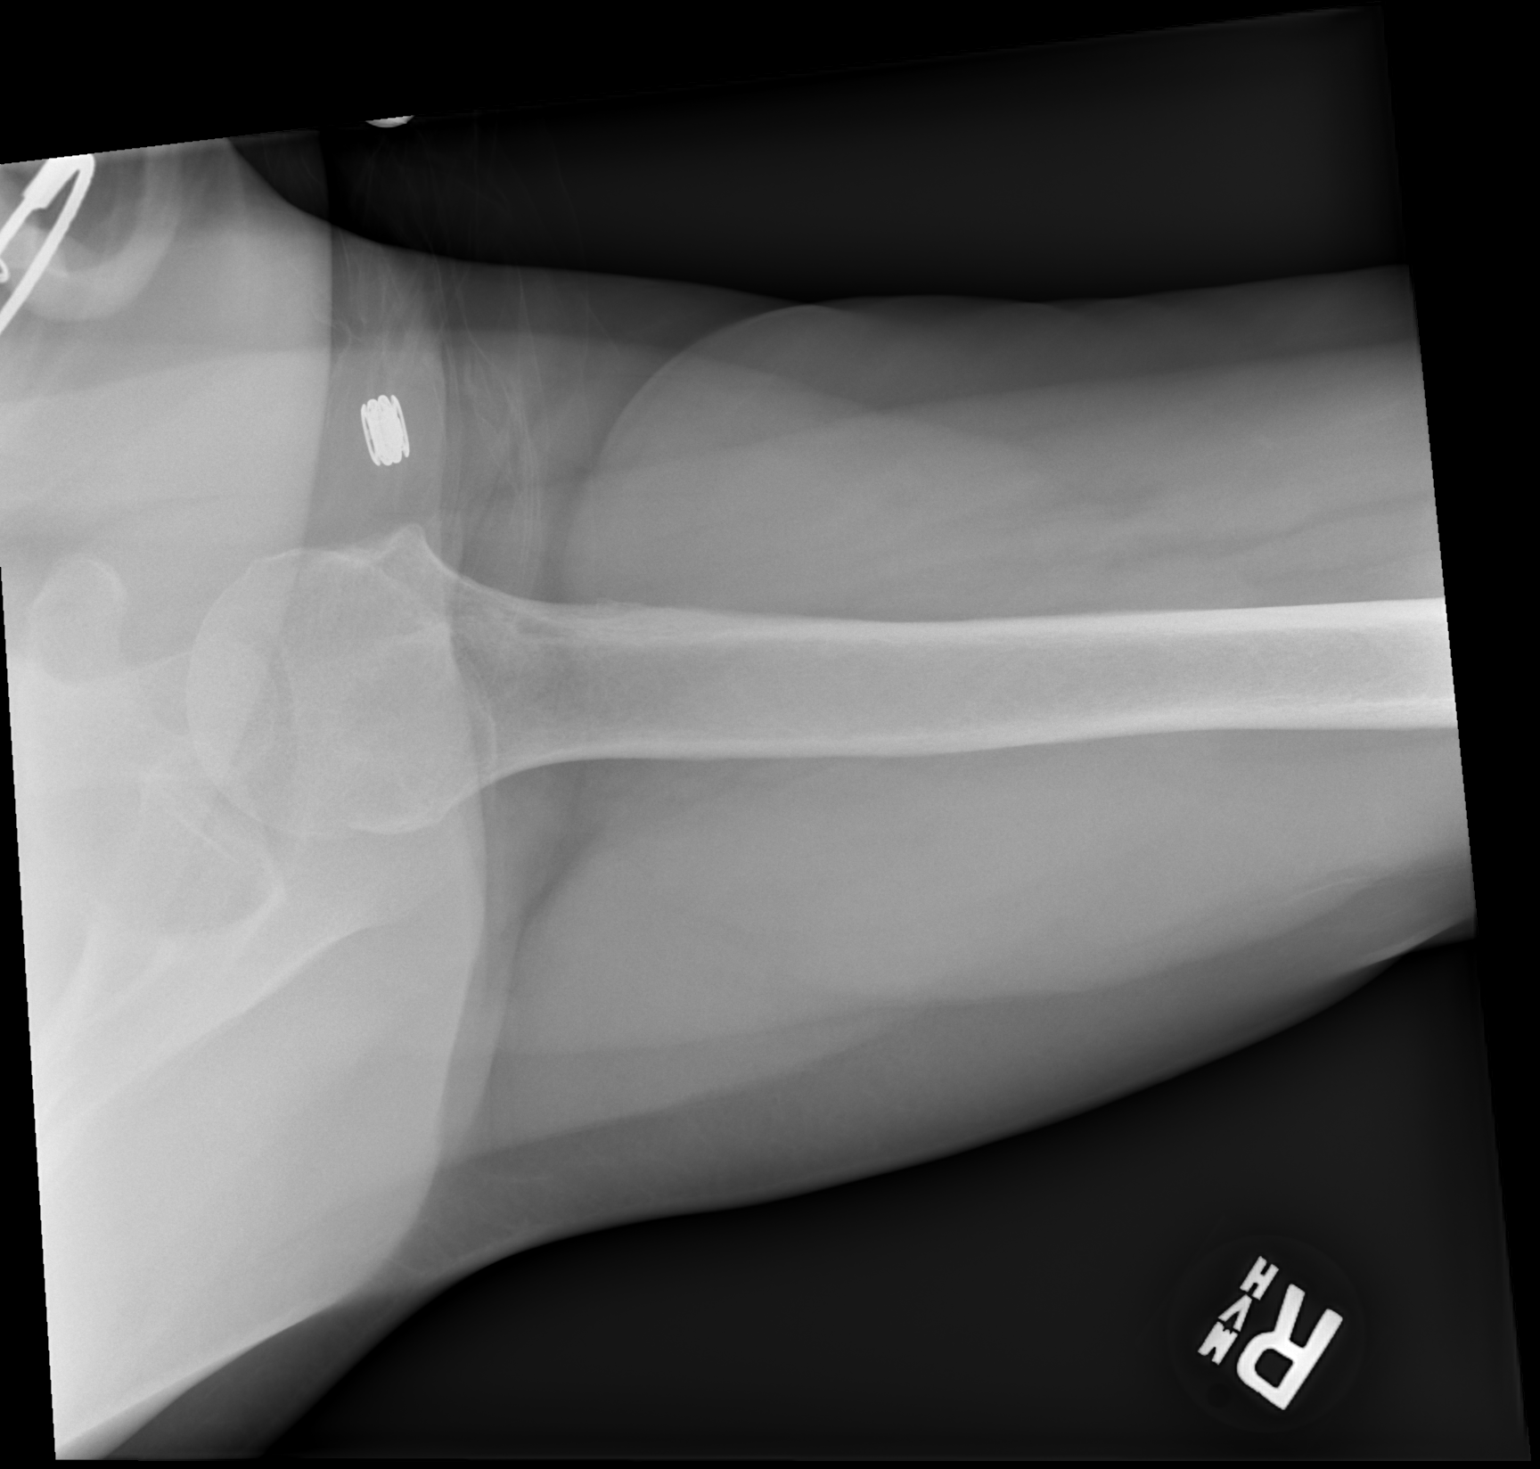

[3 of 3 positions shown; findings below may reference images not displayed]

FINDINGS: Degenerative changes at the rotator cuff insertion site. No fracture
or dislocation.
IMPRESSION: No acute abnormalities.

## 2017-02-15 MED ORDER — CYCLOBENZAPRINE HCL 10 MG PO TABS: 10.0000 mg | ORAL_TABLET | Freq: Every evening | ORAL | 0 refills | Status: DC | PRN

## 2017-02-15 MED ORDER — TRAMADOL HCL 50 MG PO TABS: 50.0000 mg | ORAL_TABLET | Freq: Four times a day (QID) | ORAL | 0 refills | Status: AC | PRN

## 2017-02-15 NOTE — ED Triage Notes (Signed)
[facility] in [facility] dept  States she tripped over a box  Pt is c/o back, right side, and right arm pain

## 2017-02-15 NOTE — Discharge Instructions (Signed)
Take Tramadol as needed for pain Take Flexeril for muscle pain/spasms Follow up with your doctor

## 2017-02-15 NOTE — ED Provider Notes (Signed)
[facility]-EMERGENCY DEPT Provider Note   CSN: [id number] Arrival date & time: 02/15/17  1824     History   Chief Complaint Chief Complaint  Patient presents with  . Fall    HPI Laurie [name] is a 61 y.o. female presents with right shoulder pain, right side pain, lower back pain after a mechanical fall.  The patient states she was at [facility] and was reaching for a cabbage when she tripped over some boxes on the floor.  She fell backwards and onto her right side.  She subsequently developed right shoulder pain, right side pain, low back pain.  She denies chronic pain in these areas.  She has been able to ambulate without difficulty.  She denies hitting her head or loss of consciousness.  She is not on blood thinners. No chest pain, SOB, abdominal pain, N/V, numbness/tingling or weakness in the arms or legs.    HPI  Past Medical History:  Diagnosis Date  . Anal fissure   . Asthma   . B12 deficiency   . Candidal esophagitis (HCC)   . Carpal tunnel syndrome   . Chronic abdominal pain   . Chronic UTI   . CKD (chronic kidney disease)   . Coronary artery disease   . Diabetes mellitus without complication (HCC)   . Diabetic gastropathy (HCC)   . Episodic low back pain   . GERD (gastroesophageal reflux disease)   . GI bleed   . Glaucoma   . Hepatitis C   . Hypertension   . IBS (irritable bowel syndrome)   . Interstitial cystitis   . Iron deficiency anemia   . Myofascial pain syndrome   . Obesity (BMI 35.0-39.9 without comorbidity)   . Plantar fasciitis     Patient Active Problem List   Diagnosis Date Noted  . Esophageal obstruction due to food impaction   . Esophageal foreign body, initial encounter   . Food impaction of esophagus   . AKI (acute kidney injury) (HCC) 07/01/2015  . Cellulitis of buttock 06/24/2015  . 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 CAD (coronary artery disease) 06/24/2015  . Essential hypertension  06/24/2015  . Asthma 06/24/2015  . UTI (lower urinary tract infection) 06/24/2015  . Candidal esophagitis (HCC) 06/24/2015  . Perianal abscess 06/24/2015  . Cellulitis of left buttock     Past Surgical History:  Procedure Laterality Date  . ABDOMINAL HYSTERECTOMY    . BILATERAL CARPAL TUNNEL RELEASE    . BREAST SURGERY    . CHOLECYSTECTOMY    . COLONOSCOPY    . ESOPHAGOGASTRODUODENOSCOPY    . REVISION UROSTOMY CUTANEOUS      OB History    No data available       Home Medications    Prior to Admission medications   Medication Sig Start Date End Date Taking? Authorizing Provider  albuterol (PROVENTIL HFA;VENTOLIN HFA) 108 (90 BASE) MCG/ACT inhaler Inhale 2 puffs into the lungs every 4 (four) hours as needed for wheezing or shortness of breath.    [provider]  amLODipine (NORVASC) 10 MG tablet Take 10 mg by mouth daily.     [provider]  aspirin EC 81 MG tablet Take 81 mg by mouth daily.    [provider]  budesonide-formoterol (SYMBICORT) 80-4.5 MCG/ACT inhaler Inhale 2 puffs into the lungs 2 (two) times daily.    [provider]  cholecalciferol (VITAMIN D) 1000 UNITS tablet Take 1,000 Units by mouth daily.  [provider]  colestipol (COLESTID) 1 G tablet Take 1 g by mouth daily.     [provider]  cyanocobalamin 1000 MCG tablet Take 1,000 mcg by mouth daily.     [provider]  dicyclomine (BENTYL) 20 MG tablet Take 20 mg 2 (two) times daily by mouth. 08/30/16 08/30/17  [provider]  FLUoxetine (PROZAC) 20 MG capsule Take 20 mg by mouth daily.     [provider]  fluticasone (FLONASE) 50 MCG/ACT nasal spray Place 2 sprays into both nostrils daily as needed for allergies or rhinitis.    [provider]  gabapentin (NEURONTIN) 300 MG capsule Take 600 mg by mouth 2 (two) times daily.    [provider]  glucagon (GLUCAGON EMERGENCY) 1 MG injection Inject 1 mg into  the vein once as needed (severe insulin reaction).    [provider]  hydrocortisone (ANUSOL-HC) 2.5 % rectal cream Place 1 application rectally 3 (three) times daily as needed for hemorrhoids.     [provider]  Insulin Glargine (BASAGLAR KWIKPEN) 100 UNIT/ML SOPN Inject 50 Units into the skin at bedtime. 08/01/16 08/01/17  [provider]  isosorbide mononitrate (IMDUR) 60 MG 24 hr tablet Take 60 mg by mouth daily.    [provider]  lidocaine (XYLOCAINE) 5 % ointment Apply 1 application topically 4 (four) times daily as needed for moderate pain.    [provider]  LIRAGLUTIDE [place] Inject 1.8 mg into the skin daily.    [provider]  lisinopril (PRINIVIL,ZESTRIL) 2.5 MG tablet Take 2.5 mg by mouth daily. 08/02/16   [provider]  magnesium oxide (MAG-OX) 400 MG tablet Take 400 mg by mouth 2 (two) times daily.    [provider]  metoprolol succinate (TOPROL-XL) 100 MG 24 hr tablet Take 100 mg by mouth daily. Take with or immediately following a meal.    [provider]  Multiple Vitamin (MULTIVITAMIN WITH MINERALS) TABS tablet Take 1 tablet by mouth daily.    [provider]  nitroGLYCERIN (NITROSTAT) 0.4 MG SL tablet Place 0.4 mg under the tongue every 5 (five) minutes as needed for chest pain.    [provider]  Nitroglycerin (RECTIV) 0.4 % OINT Place 1 application rectally 2 (two) times daily as needed (for chest pain.).     [provider]  pantoprazole (PROTONIX) 40 MG tablet Take 40 mg by mouth daily.    [provider]  pravastatin (PRAVACHOL) 20 MG tablet Take 20 mg by mouth at bedtime.    [provider]  promethazine (PHENERGAN) 25 MG tablet Take 25 mg by mouth every 6 (six) hours as needed for nausea or vomiting.    [provider]  ranitidine (ZANTAC) 300 MG tablet Take 300 mg by mouth at bedtime.    [provider]  temazepam (RESTORIL)  7.5 MG capsule Take 7.5 mg by mouth at bedtime as needed for sleep.    [provider]  traZODone (DESYREL) 50 MG tablet Take 50 mg by mouth at bedtime.    [provider]  triamcinolone cream (KENALOG) 0.5 % Apply 1 application topically 2 (two) times daily as needed (for skin irritation).     [provider]    Family History Family History  Problem Relation Age of Onset  . CAD Mother   . Cancer Father   . Stroke Brother     Social History Social History   Tobacco Use  . Smoking status:  Former Smoker  . Smokeless tobacco: Never Used  Substance Use Topics  . Alcohol use: No  . Drug use: No     Allergies   Celebrex [celecoxib]; Ciprofloxacin; Cymbalta [duloxetine hcl]; Dilaudid [hydromorphone hcl]; Diuretic [buchu-cornsilk-ch grass-hydran]; Fentanyl and related; Glucophage [metformin hcl]; Lyrica [pregabalin]; Oxycodone; Morphine and related; Sulfa antibiotics; and Vicodin [hydrocodone-acetaminophen]   Review of Systems Review of Systems  Respiratory: Negative for shortness of breath.   Cardiovascular: Negative for chest pain.  Gastrointestinal: Negative for abdominal pain.  Musculoskeletal: Positive for arthralgias and back pain. Negative for gait problem and neck pain.  Neurological: Negative for weakness, numbness and headaches.     Physical Exam Updated Vital Signs BP 117/77 (BP Location: Right Arm)   Pulse 68   Temp 97.8 F (36.6 C) (Oral)   SpO2 98%   Physical Exam  Constitutional: She is oriented to person, place, and time. She appears well-developed and well-nourished. No distress.  Eating potato chips  HENT:  Head: Normocephalic and atraumatic.  Eyes: Conjunctivae are normal. Pupils are equal, round, and reactive to light. Right eye exhibits no discharge. Left eye exhibits no discharge. No scleral icterus.  Neck: Normal range of motion.  Cardiovascular: Normal rate and regular rhythm. Exam reveals no gallop and no friction rub.   No murmur heard. Pulmonary/Chest: Effort normal and breath sounds normal. No stridor. No respiratory distress. She has no wheezes. She has no rales. She exhibits no tenderness.  Abdominal: She exhibits no distension.  Musculoskeletal:  Back: Inspection: No masses, deformity, or rash Palpation: Midline lumbar spinal tenderness and right sided back tenderness Strength: 5/5 in lower extremities and normal plantar and dorsiflexion Gait: Normal gait  Right shoulder: No obvious swelling, deformity, or warmth. Tenderness to palpation of anterior shoulder and trapezius. FROM. 5/5 strength. N/V intact.    Neurological: She is alert and oriented to person, place, and time.  Skin: Skin is warm and dry.  Psychiatric: She has a normal mood and affect. Her behavior is normal.  Nursing note and vitals reviewed.    ED Treatments / Results  Labs (all labs ordered are listed, but only abnormal results are displayed) Labs Reviewed - No data to display  EKG  EKG Interpretation None       Radiology Dg Ribs Unilateral W/chest Right  Result Date: 02/15/2017 CLINICAL DATA:  Pain after fall EXAM: RIGHT RIBS AND CHEST - 3+ VIEW COMPARISON:  Chest x-ray June 27, 2015 FINDINGS: The elevated right hemidiaphragm is stable. The heart, hila, mediastinum, lungs, and pleura are unremarkable. No rib fractures identified. IMPRESSION: No acute abnormalities. Electronically Signed   By: Gerome Samavid  Williams III M.D   On: 02/15/2017 21:00   Dg Lumbar Spine Complete  Result Date: 02/15/2017 CLINICAL DATA:  Right-sided pain after trauma/ fall. EXAM: LUMBAR SPINE - COMPLETE 4+ VIEW COMPARISON:  None. FINDINGS: Postsurgical changes seen in the right abdomen and left pelvis. Rounded increased density projected over the right side of the pubic symphysis is unchanged since the CT scan from September 2016, likely a bone [place]. No traumatic malalignment. No fracture. Mild degenerative changes seen in the lower thoracic spine  in the lower lumbar facets. Calcified atherosclerosis identified in the aorta. IMPRESSION: No acute abnormality.  Degenerative changes. Electronically Signed   By: Gerome Samavid  Williams III M.D   On: 02/15/2017 20:56   Dg Shoulder Right  Result Date: 02/15/2017 CLINICAL DATA:  Pain after fall EXAM: RIGHT SHOULDER - 2+ VIEW COMPARISON:  None. FINDINGS: Degenerative changes at the rotator  cuff insertion site. No fracture or dislocation. IMPRESSION: No acute abnormalities. Electronically Signed   By: Gerome Sam III M.D   On: 02/15/2017 20:58    Procedures Procedures (including critical care time)  Medications Ordered in ED Medications - No data to display   Initial Impression / Assessment and Plan / ED Course  I have reviewed the triage vital signs and the nursing notes.  Pertinent labs & imaging results that were available during my care of the patient were reviewed by me and considered in my medical decision making (see chart for details).  61 year old female presents with pain after a mechanical fall earlier tonight.  Vital signs are normal.  She is ambulatory without difficulty.  Lungs are clear to auscultation.  X-ray of the shoulder, chest and ribs, low back are unremarkable.  She declined pain control in the ED however requests pain medicine to go home with.  She will be prescribed tramadol and Flexeril.  Advised follow-up with her PCP as needed.  Final Clinical Impressions(s) / ED Diagnoses   Final diagnoses:  Fall, initial encounter  Acute right-sided low back pain without sciatica  Acute pain of right shoulder    ED Discharge Orders        Ordered    traMADol (ULTRAM) 50 MG tablet  Every 6 hours PRN     02/15/17 2121    cyclobenzaprine (FLEXERIL) 10 MG tablet  at bedtime and repeat x1 PRN     02/15/17 2121       Bethel Born, PA-C 02/15/17 2133    Rolland Porter, MD 02/24/17 2220

## 2021-02-06 ENCOUNTER — Observation Stay (HOSPITAL_COMMUNITY): Payer: Medicare PPO | Admitting: Anesthesiology

## 2021-02-06 ENCOUNTER — Inpatient Hospital Stay: Admit: 2021-02-06 | Payer: Medicare PPO | Admitting: General Surgery

## 2021-02-06 ENCOUNTER — Emergency Department (HOSPITAL_BASED_OUTPATIENT_CLINIC_OR_DEPARTMENT_OTHER): Payer: Medicare PPO

## 2021-02-06 ENCOUNTER — Emergency Department (HOSPITAL_COMMUNITY): Payer: Medicare PPO

## 2021-02-06 ENCOUNTER — Encounter (HOSPITAL_COMMUNITY)
Admission: EM | Disposition: A | Discharge status: Home Health | Payer: Self-pay | Source: Home / Self Care | Attending: Internal Medicine

## 2021-02-06 ENCOUNTER — Inpatient Hospital Stay (HOSPITAL_BASED_OUTPATIENT_CLINIC_OR_DEPARTMENT_OTHER)
Admission: EM | Admit: 2021-02-06 | Discharge: 2021-02-27 | DRG: 329 | Disposition: A | Discharge status: Home Health | Payer: Medicare PPO | Attending: Internal Medicine | Admitting: Internal Medicine

## 2021-02-06 ENCOUNTER — Encounter (HOSPITAL_BASED_OUTPATIENT_CLINIC_OR_DEPARTMENT_OTHER): Payer: Self-pay | Admitting: Emergency Medicine

## 2021-02-06 ENCOUNTER — Other Ambulatory Visit: Payer: Self-pay

## 2021-02-06 DIAGNOSIS — F05 Delirium due to known physiological condition: Secondary | ICD-10-CM | POA: Diagnosis not present

## 2021-02-06 DIAGNOSIS — D509 Iron deficiency anemia, unspecified: Secondary | ICD-10-CM | POA: Diagnosis present

## 2021-02-06 DIAGNOSIS — Z809 Family history of malignant neoplasm, unspecified: Secondary | ICD-10-CM

## 2021-02-06 DIAGNOSIS — E119 Type 2 diabetes mellitus without complications: Secondary | ICD-10-CM | POA: Diagnosis not present

## 2021-02-06 DIAGNOSIS — Z8249 Family history of ischemic heart disease and other diseases of the circulatory system: Secondary | ICD-10-CM

## 2021-02-06 DIAGNOSIS — Z823 Family history of stroke: Secondary | ICD-10-CM

## 2021-02-06 DIAGNOSIS — J449 Chronic obstructive pulmonary disease, unspecified: Secondary | ICD-10-CM | POA: Diagnosis not present

## 2021-02-06 DIAGNOSIS — K432 Incisional hernia without obstruction or gangrene: Secondary | ICD-10-CM | POA: Diagnosis present

## 2021-02-06 DIAGNOSIS — E1143 Type 2 diabetes mellitus with diabetic autonomic (poly)neuropathy: Secondary | ICD-10-CM | POA: Diagnosis present

## 2021-02-06 DIAGNOSIS — K651 Peritoneal abscess: Secondary | ICD-10-CM | POA: Diagnosis not present

## 2021-02-06 DIAGNOSIS — I700 Atherosclerosis of aorta: Secondary | ICD-10-CM | POA: Diagnosis present

## 2021-02-06 DIAGNOSIS — Z882 Allergy status to sulfonamides status: Secondary | ICD-10-CM

## 2021-02-06 DIAGNOSIS — K3184 Gastroparesis: Secondary | ICD-10-CM | POA: Diagnosis present

## 2021-02-06 DIAGNOSIS — E876 Hypokalemia: Secondary | ICD-10-CM | POA: Diagnosis not present

## 2021-02-06 DIAGNOSIS — I129 Hypertensive chronic kidney disease with stage 1 through stage 4 chronic kidney disease, or unspecified chronic kidney disease: Secondary | ICD-10-CM | POA: Diagnosis present

## 2021-02-06 DIAGNOSIS — R188 Other ascites: Secondary | ICD-10-CM | POA: Diagnosis not present

## 2021-02-06 DIAGNOSIS — Z888 Allergy status to other drugs, medicaments and biological substances status: Secondary | ICD-10-CM

## 2021-02-06 DIAGNOSIS — N179 Acute kidney failure, unspecified: Secondary | ICD-10-CM | POA: Diagnosis present

## 2021-02-06 DIAGNOSIS — K436 Other and unspecified ventral hernia with obstruction, without gangrene: Secondary | ICD-10-CM | POA: Diagnosis present

## 2021-02-06 DIAGNOSIS — I2510 Atherosclerotic heart disease of native coronary artery without angina pectoris: Secondary | ICD-10-CM | POA: Diagnosis not present

## 2021-02-06 DIAGNOSIS — Z4659 Encounter for fitting and adjustment of other gastrointestinal appliance and device: Secondary | ICD-10-CM

## 2021-02-06 DIAGNOSIS — Z87891 Personal history of nicotine dependence: Secondary | ICD-10-CM

## 2021-02-06 DIAGNOSIS — T8143XA Infection following a procedure, organ and space surgical site, initial encounter: Secondary | ICD-10-CM | POA: Diagnosis not present

## 2021-02-06 DIAGNOSIS — Z79899 Other long term (current) drug therapy: Secondary | ICD-10-CM

## 2021-02-06 DIAGNOSIS — E870 Hyperosmolality and hypernatremia: Secondary | ICD-10-CM | POA: Diagnosis not present

## 2021-02-06 DIAGNOSIS — Z7982 Long term (current) use of aspirin: Secondary | ICD-10-CM

## 2021-02-06 DIAGNOSIS — B9620 Unspecified Escherichia coli [E. coli] as the cause of diseases classified elsewhere: Secondary | ICD-10-CM | POA: Diagnosis not present

## 2021-02-06 DIAGNOSIS — R109 Unspecified abdominal pain: Secondary | ICD-10-CM

## 2021-02-06 DIAGNOSIS — E872 Acidosis, unspecified: Secondary | ICD-10-CM | POA: Diagnosis not present

## 2021-02-06 DIAGNOSIS — E785 Hyperlipidemia, unspecified: Secondary | ICD-10-CM | POA: Diagnosis present

## 2021-02-06 DIAGNOSIS — I63531 Cerebral infarction due to unspecified occlusion or stenosis of right posterior cerebral artery: Secondary | ICD-10-CM | POA: Diagnosis not present

## 2021-02-06 DIAGNOSIS — E440 Moderate protein-calorie malnutrition: Secondary | ICD-10-CM | POA: Diagnosis not present

## 2021-02-06 DIAGNOSIS — K5730 Diverticulosis of large intestine without perforation or abscess without bleeding: Secondary | ICD-10-CM | POA: Diagnosis present

## 2021-02-06 DIAGNOSIS — R4182 Altered mental status, unspecified: Secondary | ICD-10-CM

## 2021-02-06 DIAGNOSIS — E538 Deficiency of other specified B group vitamins: Secondary | ICD-10-CM | POA: Diagnosis present

## 2021-02-06 DIAGNOSIS — E1122 Type 2 diabetes mellitus with diabetic chronic kidney disease: Secondary | ICD-10-CM | POA: Diagnosis present

## 2021-02-06 DIAGNOSIS — Z6834 Body mass index (BMI) 34.0-34.9, adult: Secondary | ICD-10-CM

## 2021-02-06 DIAGNOSIS — G9341 Metabolic encephalopathy: Secondary | ICD-10-CM | POA: Diagnosis not present

## 2021-02-06 DIAGNOSIS — Z7951 Long term (current) use of inhaled steroids: Secondary | ICD-10-CM

## 2021-02-06 DIAGNOSIS — F419 Anxiety disorder, unspecified: Secondary | ICD-10-CM | POA: Diagnosis present

## 2021-02-06 DIAGNOSIS — I2583 Coronary atherosclerosis due to lipid rich plaque: Secondary | ICD-10-CM

## 2021-02-06 DIAGNOSIS — R29702 NIHSS score 2: Secondary | ICD-10-CM | POA: Diagnosis not present

## 2021-02-06 DIAGNOSIS — F32A Depression, unspecified: Secondary | ICD-10-CM | POA: Diagnosis not present

## 2021-02-06 DIAGNOSIS — K660 Peritoneal adhesions (postprocedural) (postinfection): Secondary | ICD-10-CM | POA: Diagnosis present

## 2021-02-06 DIAGNOSIS — K219 Gastro-esophageal reflux disease without esophagitis: Secondary | ICD-10-CM | POA: Diagnosis present

## 2021-02-06 DIAGNOSIS — R339 Retention of urine, unspecified: Secondary | ICD-10-CM | POA: Diagnosis not present

## 2021-02-06 DIAGNOSIS — Z20822 Contact with and (suspected) exposure to covid-19: Secondary | ICD-10-CM | POA: Diagnosis present

## 2021-02-06 DIAGNOSIS — E860 Dehydration: Secondary | ICD-10-CM | POA: Diagnosis present

## 2021-02-06 DIAGNOSIS — Z885 Allergy status to narcotic agent status: Secondary | ICD-10-CM

## 2021-02-06 DIAGNOSIS — E669 Obesity, unspecified: Secondary | ICD-10-CM | POA: Diagnosis present

## 2021-02-06 DIAGNOSIS — N182 Chronic kidney disease, stage 2 (mild): Secondary | ICD-10-CM | POA: Diagnosis present

## 2021-02-06 DIAGNOSIS — I10 Essential (primary) hypertension: Secondary | ICD-10-CM | POA: Diagnosis present

## 2021-02-06 DIAGNOSIS — K56609 Unspecified intestinal obstruction, unspecified as to partial versus complete obstruction: Secondary | ICD-10-CM | POA: Diagnosis present

## 2021-02-06 DIAGNOSIS — H409 Unspecified glaucoma: Secondary | ICD-10-CM | POA: Diagnosis present

## 2021-02-06 DIAGNOSIS — Z881 Allergy status to other antibiotic agents status: Secondary | ICD-10-CM

## 2021-02-06 DIAGNOSIS — Z936 Other artificial openings of urinary tract status: Secondary | ICD-10-CM

## 2021-02-06 HISTORY — PX: LAPAROSCOPY: SHX197

## 2021-02-06 LAB — GLUCOSE, CAPILLARY: Glucose-Capillary: 133 mg/dL — ABNORMAL HIGH (ref 70–99)

## 2021-02-06 LAB — CBC WITH DIFFERENTIAL/PLATELET
Abs Immature Granulocytes: 0.23 10*3/uL — ABNORMAL HIGH (ref 0.00–0.07)
Basophils Absolute: 0 10*3/uL (ref 0.0–0.1)
Basophils Relative: 0 %
Eosinophils Absolute: 0.1 10*3/uL (ref 0.0–0.5)
Eosinophils Relative: 1 %
HCT: 40.9 % (ref 36.0–46.0)
Hemoglobin: 13.8 g/dL (ref 12.0–15.0)
Immature Granulocytes: 2 %
Lymphocytes Relative: 13 %
Lymphs Abs: 1.5 10*3/uL (ref 0.7–4.0)
MCH: 31.9 pg (ref 26.0–34.0)
MCHC: 33.7 g/dL (ref 30.0–36.0)
MCV: 94.5 fL (ref 80.0–100.0)
Monocytes Absolute: 1.4 10*3/uL — ABNORMAL HIGH (ref 0.1–1.0)
Monocytes Relative: 13 %
Neutro Abs: 7.9 10*3/uL — ABNORMAL HIGH (ref 1.7–7.7)
Neutrophils Relative %: 71 %
Platelets: 507 10*3/uL — ABNORMAL HIGH (ref 150–400)
RBC: 4.33 MIL/uL (ref 3.87–5.11)
RDW: 13.4 % (ref 11.5–15.5)
WBC: 11.1 10*3/uL — ABNORMAL HIGH (ref 4.0–10.5)
nRBC: 0 % (ref 0.0–0.2)

## 2021-02-06 LAB — COMPREHENSIVE METABOLIC PANEL
ALT: 18 U/L (ref 0–44)
AST: 22 U/L (ref 15–41)
Albumin: 4.1 g/dL (ref 3.5–5.0)
Alkaline Phosphatase: 58 U/L (ref 38–126)
Anion gap: 9 (ref 5–15)
BUN: 34 mg/dL — ABNORMAL HIGH (ref 8–23)
CO2: 26 mmol/L (ref 22–32)
Calcium: 9.4 mg/dL (ref 8.9–10.3)
Chloride: 102 mmol/L (ref 98–111)
Creatinine, Ser: 4.19 mg/dL — ABNORMAL HIGH (ref 0.44–1.00)
GFR, Estimated: 11 mL/min — ABNORMAL LOW (ref >60)
Glucose, Bld: 130 mg/dL — ABNORMAL HIGH (ref 70–99)
Potassium: 5.1 mmol/L (ref 3.5–5.1)
Sodium: 137 mmol/L (ref 135–145)
Total Bilirubin: 0.4 mg/dL (ref 0.3–1.2)
Total Protein: 8 g/dL (ref 6.5–8.1)

## 2021-02-06 LAB — RESP PANEL BY RT-PCR (FLU A&B, COVID) ARPGX2
Influenza A by PCR: NEGATIVE
Influenza B by PCR: NEGATIVE
SARS Coronavirus 2 by RT PCR: NEGATIVE

## 2021-02-06 LAB — LIPASE, BLOOD: Lipase: 34 U/L (ref 11–51)

## 2021-02-06 IMAGING — CT CT ABD-PELV W/O CM
2 of 4 series · 15 of 46 positions shown, 17 images · non-contrast
Comparison: [DATE] and [DATE]

CLINICAL DATA: Nausea, diarrhea and vomiting. Diverticulitis
suspected. History of interstitial cystitis and status post
cystectomy with urinary diversion.

EXAM:
CT ABDOMEN AND PELVIS WITHOUT CONTRAST
TECHNIQUE: Multidetector CT imaging of the abdomen and pelvis was performed
following the standard protocol without IV contrast.

[Series 2: abd pel wo · axial · 0.76mm/px · z∈[-471,-26]mm · 12 of 99 slices shown, 14 images]
[im 5/99  soft-tissue]
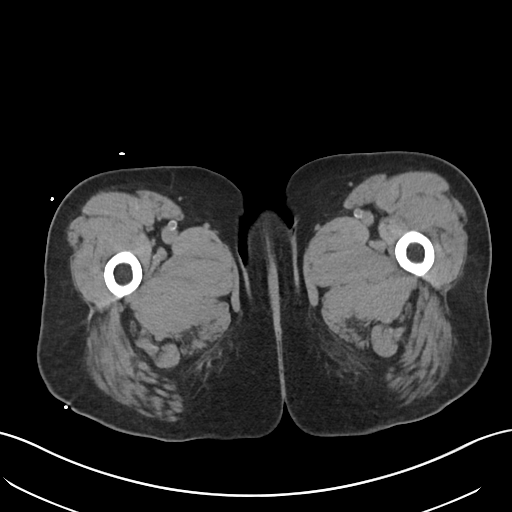
[im 5/99  bone]
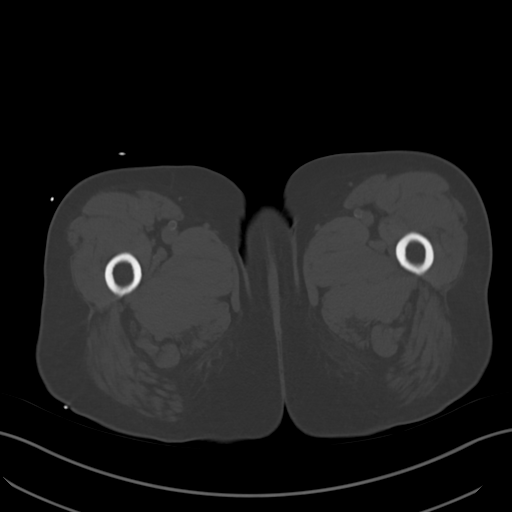
[im 13/99  soft-tissue]
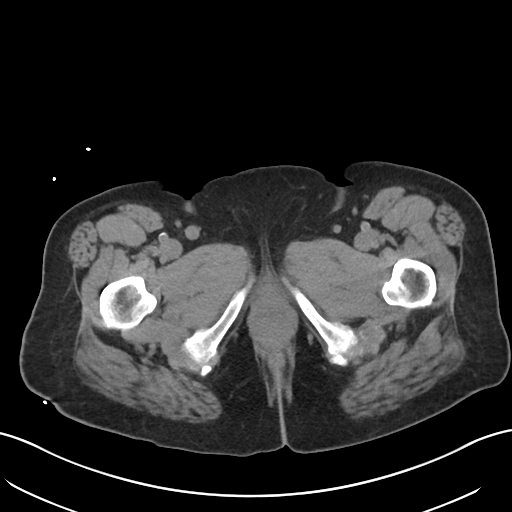
[im 21/99  soft-tissue]
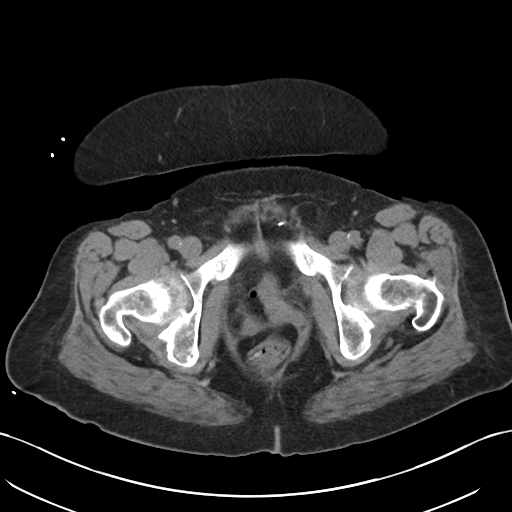
[im 29/99  soft-tissue]
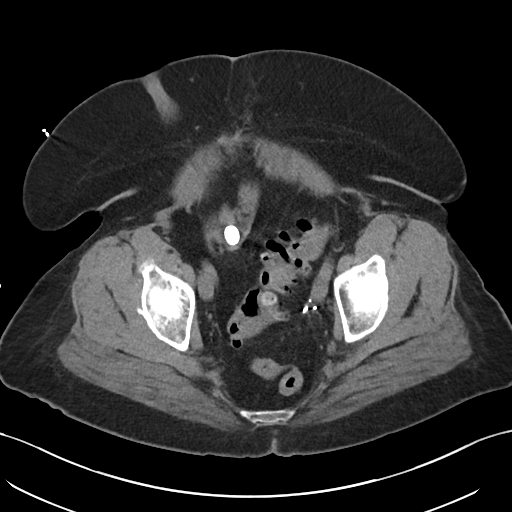
[im 37/99  soft-tissue]
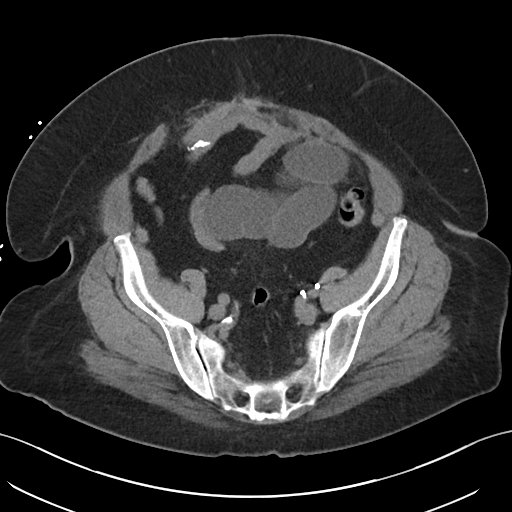
[im 45/99  soft-tissue]
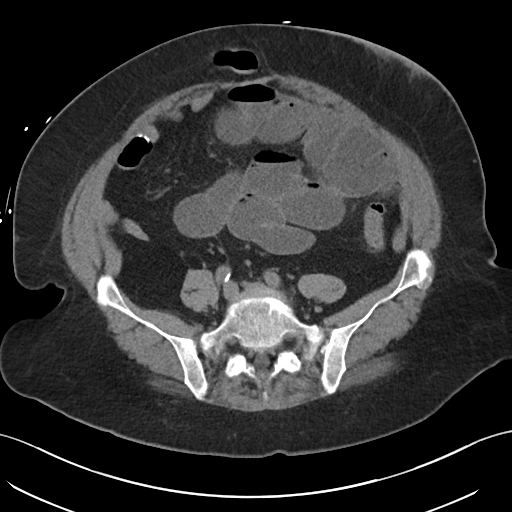
[im 54/99  soft-tissue]
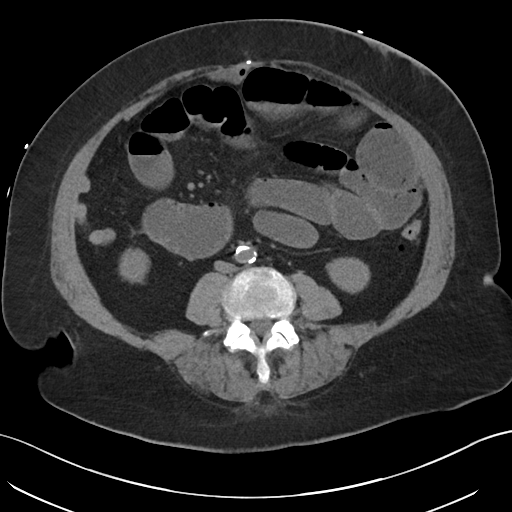
[im 62/99  soft-tissue]
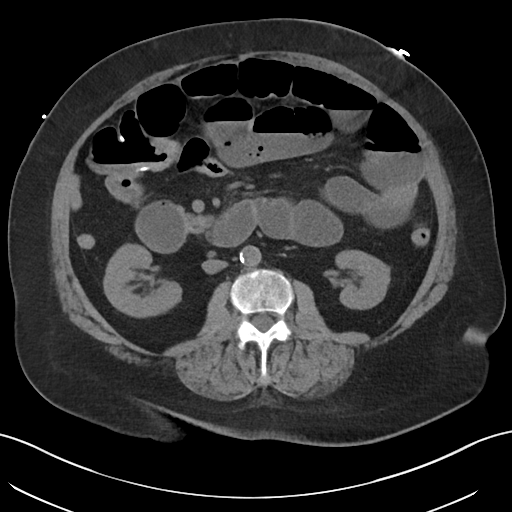
[im 70/99  soft-tissue]
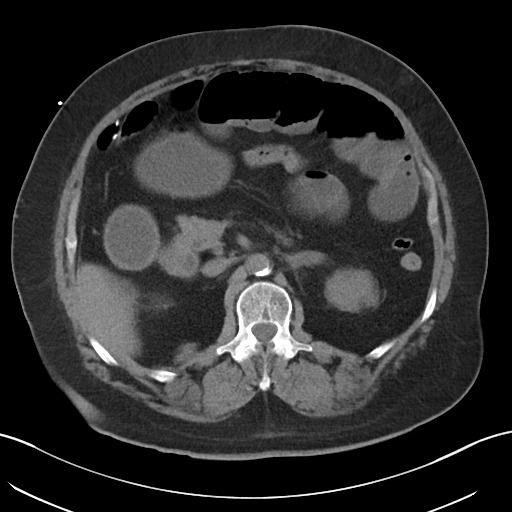
[im 70/99  bone]
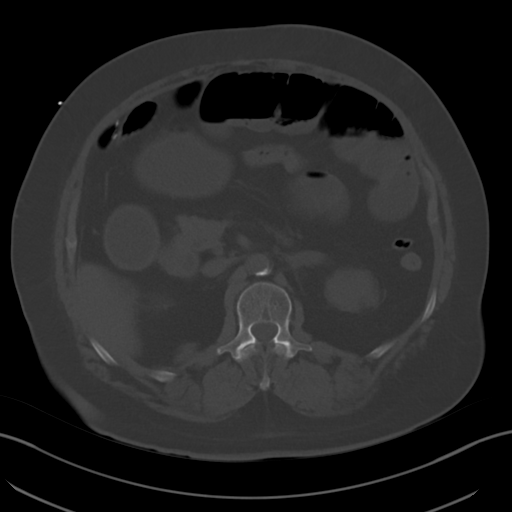
[im 78/99  soft-tissue]
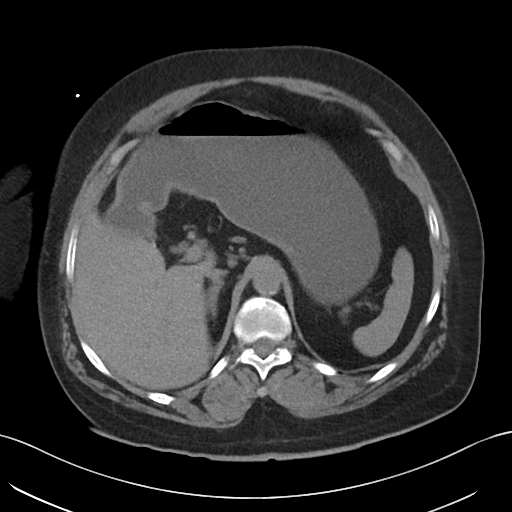
[im 86/99  soft-tissue]
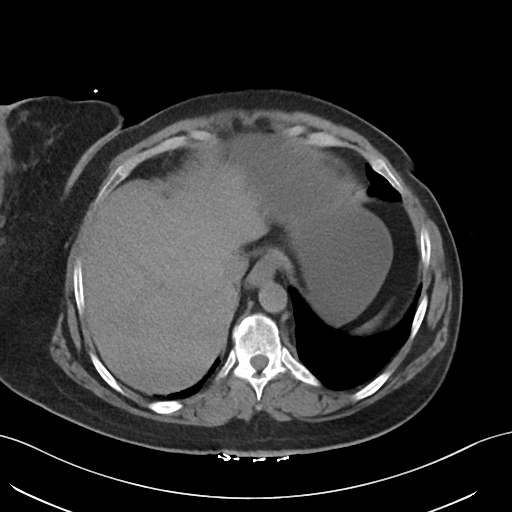
[im 94/99  soft-tissue]
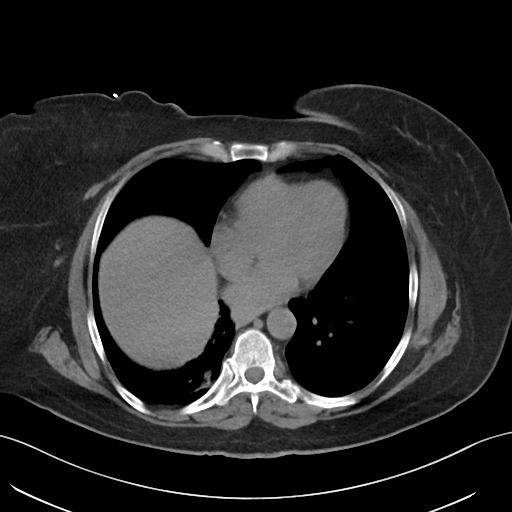

[Series 5: coronal · coronal · 0.77mm/px · 3 of 105 slices shown]
[im 35/105  soft-tissue]
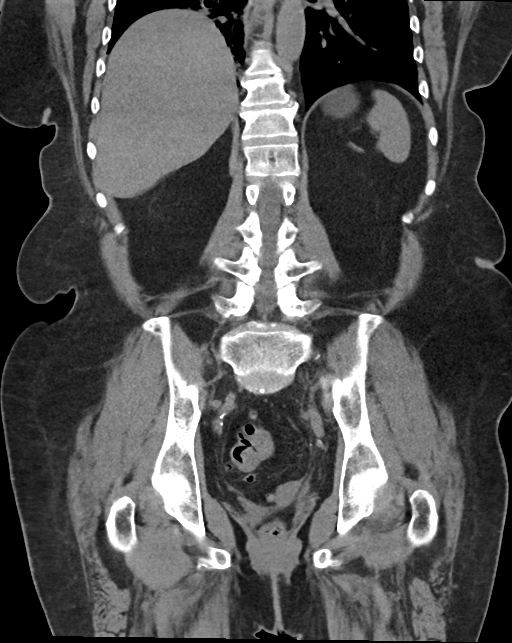
[im 47/105  soft-tissue]
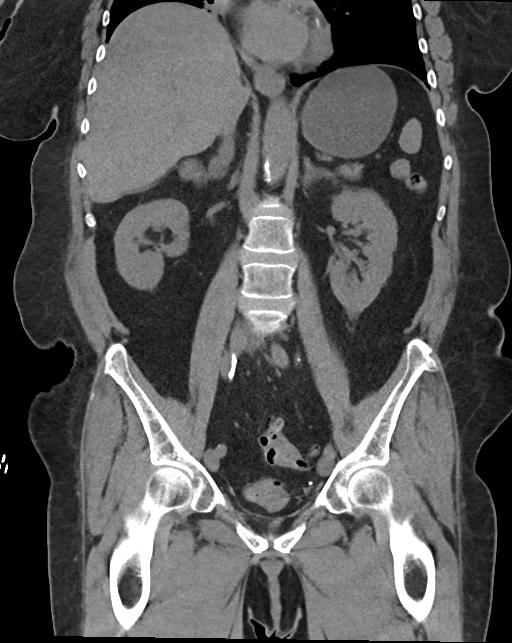
[im 58/105  soft-tissue]
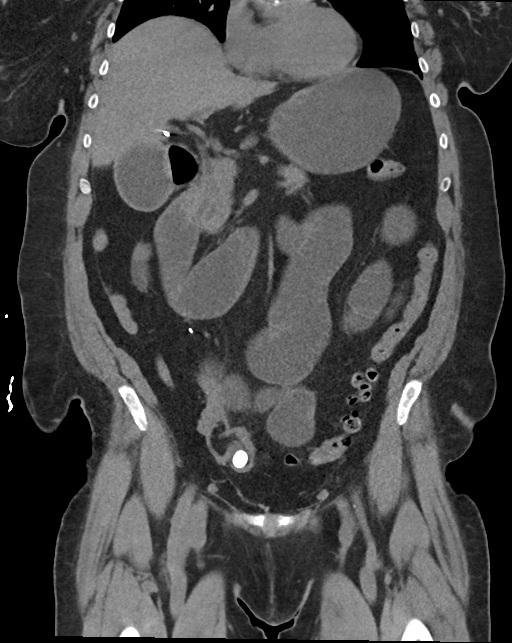

[15 of 46 positions shown; findings below may reference images not displayed]

FINDINGS: Lower chest: Chronic volume loss or scarring in the medial right
lung base. Otherwise, lung bases are clear. No pleural effusions.

Hepatobiliary: Cholecystectomy.  Normal appearance of liver.

Pancreas: Unremarkable. No pancreatic ductal dilatation or
surrounding inflammatory changes.

Spleen: Normal in size without focal abnormality.

Adrenals/Urinary Tract: Normal appearance of the adrenal glands.
Kidneys are slightly small for size. No hydronephrosis or ureter
stones. Status post cystectomy with urinary diversion or ostomy in
the right lower quadrant. Ileal conduit is not distended but there
is a large stone within the conduit that measures 1.5 cm and this is
new. No dilatation of the ureters.

Stomach/Bowel: Stomach is distended with air-fluid level. Dilated
loops of proximal small bowel containing fluid. Transition point
appears to be related to a supraumbilical ventral hernia. The small
bowel distal to the hernia is decompressed. Evidence for an
ileocolic anastomosis in the right abdomen. No colonic dilatation.
Multiple colonic diverticula particularly in the sigmoid colon. No
colonic inflammation.

Vascular/Lymphatic: Atherosclerotic calcifications in the abdominal
aorta and iliac arteries without aneurysm. No significant lymph node
enlargement in the abdomen or pelvis.

Reproductive: Status post hysterectomy. No adnexal masses.

Other: Negative for free fluid. Negative for free air. Small left
inguinal hernia containing fat.

Musculoskeletal: Degenerative facet arthropathy in the lower lumbar
spine. Chronic areas of sclerosis in the lower thoracic spine.
Chronic skin and superficial soft tissue thickening in the left
anterior abdomen.
IMPRESSION: 1. Small bowel obstruction secondary to an incarcerated ventral
hernia. Distended stomach and small bowel loops with a transition
point associated with a ventral hernia. No evidence for free fluid.
2. Postsurgical changes associated with a cystectomy and ileal
conduit. Negative for hydronephrosis. However, there is a large
stone within the iliac conduit measuring up to 1.5 cm.
3.  Aortic Atherosclerosis ([AQ]-[AQ]).
4. Colonic diverticulosis but no evidence for acute diverticulitis.

These results were called by telephone at the time of interpretation
on [DATE] at [DATE] to provider LECHISSA , who verbally
acknowledged these results.

## 2021-02-06 SURGERY — LAPAROSCOPY, DIAGNOSTIC
Anesthesia: General

## 2021-02-06 MED ORDER — INSULIN ASPART 100 UNIT/ML IJ SOLN
0.0000 [IU] | INTRAMUSCULAR | Status: DC
  Administered 2021-02-07 – 2021-02-11 (×6): 1 [IU] via SUBCUTANEOUS
  Administered 2021-02-12: 2 [IU] via SUBCUTANEOUS
  Administered 2021-02-12 – 2021-02-13 (×3): 1 [IU] via SUBCUTANEOUS
  Filled 2021-02-06: qty 0.06

## 2021-02-06 MED ORDER — DIPHENHYDRAMINE HCL 50 MG/ML IJ SOLN
12.5000 mg | INTRAMUSCULAR | Status: DC | PRN
Start: 2021-02-06 — End: 2021-02-07

## 2021-02-06 MED ORDER — ACETAMINOPHEN 10 MG/ML IV SOLN
INTRAVENOUS | Status: AC
  Filled 2021-02-06: qty 100

## 2021-02-06 MED ORDER — EPHEDRINE SULFATE-NACL 50-0.9 MG/10ML-% IV SOSY
PREFILLED_SYRINGE | INTRAVENOUS | Status: DC | PRN
Start: 2021-02-06 — End: 2021-02-06
  Administered 2021-02-06: 5 mg via INTRAVENOUS

## 2021-02-06 MED ORDER — MOMETASONE FURO-FORMOTEROL FUM 100-5 MCG/ACT IN AERO
2.0000 | INHALATION_SPRAY | Freq: Two times a day (BID) | RESPIRATORY_TRACT | Status: DC
Start: 2021-02-07 — End: 2021-02-27
  Administered 2021-02-07 – 2021-02-27 (×40): 2 via RESPIRATORY_TRACT
  Filled 2021-02-06 (×2): qty 8.8

## 2021-02-06 MED ORDER — LIDOCAINE HCL (PF) 2 % IJ SOLN
INTRAMUSCULAR | Status: AC
  Filled 2021-02-06: qty 5

## 2021-02-06 MED ORDER — ONDANSETRON HCL 4 MG/2ML IJ SOLN
INTRAMUSCULAR | Status: AC
  Filled 2021-02-06: qty 2

## 2021-02-06 MED ORDER — DEXAMETHASONE SODIUM PHOSPHATE 10 MG/ML IJ SOLN
INTRAMUSCULAR | Status: AC
  Filled 2021-02-06: qty 1

## 2021-02-06 MED ORDER — SODIUM CHLORIDE 0.9 % IV SOLN: INTRAVENOUS | Status: DC

## 2021-02-06 MED ORDER — BUPIVACAINE-EPINEPHRINE (PF) 0.25% -1:200000 IJ SOLN
INTRAMUSCULAR | Status: AC
  Filled 2021-02-06: qty 30

## 2021-02-06 MED ORDER — AMISULPRIDE (ANTIEMETIC) 5 MG/2ML IV SOLN: 10.0000 mg | Freq: Once | INTRAVENOUS | Status: DC | PRN

## 2021-02-06 MED ORDER — SUCCINYLCHOLINE CHLORIDE 200 MG/10ML IV SOSY
PREFILLED_SYRINGE | INTRAVENOUS | Status: AC
  Filled 2021-02-06: qty 10

## 2021-02-06 MED ORDER — SODIUM CHLORIDE 0.9 % IV SOLN: Freq: Once | INTRAVENOUS | Status: AC

## 2021-02-06 MED ORDER — METOPROLOL TARTRATE 5 MG/5ML IV SOLN
5.0000 mg | Freq: Four times a day (QID) | INTRAVENOUS | Status: DC | PRN
  Administered 2021-02-07: 5 mg via INTRAVENOUS
  Filled 2021-02-06: qty 5

## 2021-02-06 MED ORDER — ROCURONIUM BROMIDE 10 MG/ML (PF) SYRINGE
PREFILLED_SYRINGE | INTRAVENOUS | Status: AC
  Filled 2021-02-06: qty 10

## 2021-02-06 MED ORDER — PROPOFOL 10 MG/ML IV BOLUS
INTRAVENOUS | Status: AC
  Filled 2021-02-06: qty 20

## 2021-02-06 MED ORDER — PROMETHAZINE HCL 25 MG/ML IJ SOLN: 6.2500 mg | INTRAMUSCULAR | Status: DC | PRN

## 2021-02-06 MED ORDER — DIPHENHYDRAMINE HCL 50 MG/ML IJ SOLN
INTRAMUSCULAR | Status: AC
  Filled 2021-02-06: qty 1

## 2021-02-06 MED ORDER — PHENYLEPHRINE HCL (PRESSORS) 10 MG/ML IV SOLN
INTRAVENOUS | Status: DC | PRN
  Administered 2021-02-06: 80 ug via INTRAVENOUS

## 2021-02-06 MED ORDER — GLYCOPYRROLATE 0.2 MG/ML IJ SOLN
INTRAMUSCULAR | Status: AC
  Filled 2021-02-06: qty 1

## 2021-02-06 MED ORDER — FENTANYL CITRATE (PF) 250 MCG/5ML IJ SOLN
INTRAMUSCULAR | Status: AC
  Filled 2021-02-06: qty 5

## 2021-02-06 MED ORDER — HYDROMORPHONE HCL 1 MG/ML IJ SOLN
1.0000 mg | Freq: Once | INTRAMUSCULAR | Status: AC
Start: 2021-02-06 — End: 2021-02-06
  Administered 2021-02-06: 1 mg via INTRAVENOUS
  Filled 2021-02-06: qty 1

## 2021-02-06 MED ORDER — SODIUM CHLORIDE 0.9 % IV BOLUS
1000.0000 mL | Freq: Once | INTRAVENOUS | Status: AC
  Administered 2021-02-06: 1000 mL via INTRAVENOUS

## 2021-02-06 MED ORDER — ALBUTEROL SULFATE HFA 108 (90 BASE) MCG/ACT IN AERS: 2.0000 | INHALATION_SPRAY | RESPIRATORY_TRACT | Status: DC | PRN

## 2021-02-06 MED ORDER — HYDROMORPHONE HCL 1 MG/ML IJ SOLN: 1.0000 mg | INTRAMUSCULAR | Status: DC | PRN

## 2021-02-06 MED ORDER — PHENYLEPHRINE HCL (PRESSORS) 10 MG/ML IV SOLN
INTRAVENOUS | Status: AC
  Filled 2021-02-06: qty 2

## 2021-02-06 MED ORDER — LIDOCAINE 2% (20 MG/ML) 5 ML SYRINGE
INTRAMUSCULAR | Status: DC | PRN
Start: 2021-02-06 — End: 2021-02-06
  Administered 2021-02-06: 60 mg via INTRAVENOUS

## 2021-02-06 MED ORDER — SODIUM CHLORIDE 0.9% FLUSH
3.0000 mL | Freq: Two times a day (BID) | INTRAVENOUS | Status: DC
  Administered 2021-02-07 – 2021-02-27 (×36): 3 mL via INTRAVENOUS

## 2021-02-06 MED ORDER — SUGAMMADEX SODIUM 200 MG/2ML IV SOLN
INTRAVENOUS | Status: DC | PRN
  Administered 2021-02-06: 200 mg via INTRAVENOUS

## 2021-02-06 MED ORDER — FLUTICASONE PROPIONATE 50 MCG/ACT NA SUSP: 2.0000 | Freq: Every day | NASAL | Status: DC | PRN

## 2021-02-06 MED ORDER — FENTANYL CITRATE (PF) 250 MCG/5ML IJ SOLN
INTRAMUSCULAR | Status: DC | PRN
  Administered 2021-02-06: 25 ug via INTRAVENOUS
  Administered 2021-02-06: 50 ug via INTRAVENOUS

## 2021-02-06 MED ORDER — ALBUTEROL SULFATE HFA 108 (90 BASE) MCG/ACT IN AERS
INHALATION_SPRAY | RESPIRATORY_TRACT | Status: AC
  Filled 2021-02-06: qty 6.7

## 2021-02-06 MED ORDER — SUCCINYLCHOLINE CHLORIDE 200 MG/10ML IV SOSY
PREFILLED_SYRINGE | INTRAVENOUS | Status: DC | PRN
  Administered 2021-02-06: 140 mg via INTRAVENOUS

## 2021-02-06 MED ORDER — PHENYLEPHRINE HCL-NACL 20-0.9 MG/250ML-% IV SOLN
INTRAVENOUS | Status: DC | PRN
  Administered 2021-02-06: 50 ug/min via INTRAVENOUS

## 2021-02-06 MED ORDER — CEFAZOLIN SODIUM-DEXTROSE 2-4 GM/100ML-% IV SOLN
2.0000 g | INTRAVENOUS | Status: AC
  Administered 2021-02-06: 2 g via INTRAVENOUS

## 2021-02-06 MED ORDER — CEFAZOLIN SODIUM-DEXTROSE 2-4 GM/100ML-% IV SOLN
INTRAVENOUS | Status: AC
  Filled 2021-02-06: qty 100

## 2021-02-06 MED ORDER — NITROGLYCERIN 0.4 MG SL SUBL: 0.4000 mg | SUBLINGUAL_TABLET | SUBLINGUAL | Status: DC | PRN

## 2021-02-06 MED ORDER — ALBUTEROL SULFATE HFA 108 (90 BASE) MCG/ACT IN AERS
INHALATION_SPRAY | RESPIRATORY_TRACT | Status: DC | PRN
  Administered 2021-02-06: 6 via RESPIRATORY_TRACT

## 2021-02-06 MED ORDER — 0.9 % SODIUM CHLORIDE (POUR BTL) OPTIME
TOPICAL | Status: DC | PRN
  Administered 2021-02-06: 2000 mL

## 2021-02-06 MED ORDER — ONDANSETRON HCL 4 MG/2ML IJ SOLN
4.0000 mg | Freq: Once | INTRAMUSCULAR | Status: AC
  Administered 2021-02-06: 4 mg via INTRAVENOUS
  Filled 2021-02-06: qty 2

## 2021-02-06 MED ORDER — ACETAMINOPHEN 10 MG/ML IV SOLN
INTRAVENOUS | Status: DC | PRN
  Administered 2021-02-06: 1000 mg via INTRAVENOUS

## 2021-02-06 MED ORDER — DIPHENHYDRAMINE HCL 50 MG/ML IJ SOLN
INTRAMUSCULAR | Status: DC | PRN
  Administered 2021-02-06: 25 mg via INTRAVENOUS

## 2021-02-06 MED ORDER — DEXAMETHASONE SODIUM PHOSPHATE 10 MG/ML IJ SOLN
INTRAMUSCULAR | Status: DC | PRN
  Administered 2021-02-06: 4 mg via INTRAVENOUS

## 2021-02-06 MED ORDER — ACETAMINOPHEN 10 MG/ML IV SOLN
1000.0000 mg | Freq: Four times a day (QID) | INTRAVENOUS | Status: AC | PRN
  Filled 2021-02-06: qty 100

## 2021-02-06 MED ORDER — HYDROMORPHONE HCL 1 MG/ML IJ SOLN
0.2500 mg | INTRAMUSCULAR | Status: DC | PRN
  Administered 2021-02-07: 0.25 mg via INTRAVENOUS

## 2021-02-06 MED ORDER — ONDANSETRON HCL 4 MG/2ML IJ SOLN
4.0000 mg | Freq: Four times a day (QID) | INTRAMUSCULAR | Status: DC | PRN
  Administered 2021-02-06 – 2021-02-18 (×4): 4 mg via INTRAVENOUS
  Filled 2021-02-06 (×3): qty 2

## 2021-02-06 MED ORDER — LACTATED RINGERS IV BOLUS
1000.0000 mL | Freq: Once | INTRAVENOUS | Status: AC
  Administered 2021-02-06: 1000 mL via INTRAVENOUS

## 2021-02-06 MED ORDER — PROPOFOL 10 MG/ML IV BOLUS
INTRAVENOUS | Status: DC | PRN
  Administered 2021-02-06: 100 mg via INTRAVENOUS

## 2021-02-06 MED ORDER — DIPHENHYDRAMINE HCL 50 MG/ML IJ SOLN
25.0000 mg | Freq: Once | INTRAMUSCULAR | Status: AC | PRN
  Administered 2021-02-06: 25 mg via INTRAVENOUS
  Filled 2021-02-06: qty 1

## 2021-02-06 MED ORDER — ROCURONIUM BROMIDE 10 MG/ML (PF) SYRINGE
PREFILLED_SYRINGE | INTRAVENOUS | Status: DC | PRN
  Administered 2021-02-06: 70 mg via INTRAVENOUS
  Administered 2021-02-06: 10 mg via INTRAVENOUS

## 2021-02-06 MED ORDER — HYDROCORTISONE 2.5 % RE CREA: 1.0000 "application " | TOPICAL_CREAM | Freq: Three times a day (TID) | RECTAL | Status: DC | PRN

## 2021-02-06 MED ORDER — MIDAZOLAM HCL 2 MG/2ML IJ SOLN
INTRAMUSCULAR | Status: AC
  Filled 2021-02-06: qty 2

## 2021-02-06 MED ORDER — BUPIVACAINE LIPOSOME 1.3 % IJ SUSP
INTRAMUSCULAR | Status: AC
  Filled 2021-02-06: qty 20

## 2021-02-06 SURGICAL SUPPLY — 37 items
BAG COUNTER SPONGE SURGICOUNT (BAG) IMPLANT
COVER SURGICAL LIGHT HANDLE (MISCELLANEOUS) ×2 IMPLANT
DECANTER SPIKE VIAL GLASS SM (MISCELLANEOUS) IMPLANT
DERMABOND ADVANCED (GAUZE/BANDAGES/DRESSINGS)
DERMABOND ADVANCED .7 DNX12 (GAUZE/BANDAGES/DRESSINGS) IMPLANT
DRAPE WARM FLUID 44X44 (DRAPES) ×2 IMPLANT
ELECT REM PT RETURN 15FT ADLT (MISCELLANEOUS) ×2 IMPLANT
GLOVE SURG POLYISO LF SZ7 (GLOVE) ×4 IMPLANT
GLOVE SURG UNDER POLY LF SZ7 (GLOVE) ×2 IMPLANT
GOWN STRL REUS W/TWL LRG LVL3 (GOWN DISPOSABLE) ×2 IMPLANT
GOWN STRL REUS W/TWL XL LVL3 (GOWN DISPOSABLE) ×4 IMPLANT
HANDLE SUCTION POOLE (INSTRUMENTS) ×1 IMPLANT
IRRIG SUCT STRYKERFLOW 2 WTIP (MISCELLANEOUS)
IRRIGATION SUCT STRKRFLW 2 WTP (MISCELLANEOUS) IMPLANT
KIT BASIN OR (CUSTOM PROCEDURE TRAY) ×2 IMPLANT
KIT TURNOVER KIT A (KITS) IMPLANT
RELOAD PROXIMATE 75MM BLUE (ENDOMECHANICALS) ×2 IMPLANT
RETAINER VISCERA MED (MISCELLANEOUS) ×2 IMPLANT
SHEARS HARMONIC ACE PLUS 36CM (ENDOMECHANICALS) IMPLANT
SLEEVE XCEL OPT CAN 5 100 (ENDOMECHANICALS) ×2 IMPLANT
STAPLER PROXIMATE 75MM BLUE (STAPLE) ×2 IMPLANT
SUCTION POOLE HANDLE (INSTRUMENTS) ×2
SUT MNCRL AB 4-0 PS2 18 (SUTURE) ×2 IMPLANT
SUT PDS AB 0 CT1 36 (SUTURE) ×4 IMPLANT
SUT PDS AB 3-0 SH 27 (SUTURE) ×2 IMPLANT
SUT SILK 2 0 (SUTURE) ×2
SUT SILK 2 0 SH CR/8 (SUTURE) ×2 IMPLANT
SUT SILK 2-0 18XBRD TIE 12 (SUTURE) ×1 IMPLANT
SUT SILK 3 0 (SUTURE)
SUT SILK 3 0 SH CR/8 (SUTURE) ×4 IMPLANT
SUT SILK 3-0 18XBRD TIE 12 (SUTURE) IMPLANT
TOWEL OR 17X26 10 PK STRL BLUE (TOWEL DISPOSABLE) ×2 IMPLANT
TOWEL OR NON WOVEN STRL DISP B (DISPOSABLE) ×2 IMPLANT
TRAY FOLEY MTR SLVR 16FR STAT (SET/KITS/TRAYS/PACK) ×2 IMPLANT
TRAY LAPAROSCOPIC (CUSTOM PROCEDURE TRAY) ×2 IMPLANT
TROCAR BLADELESS OPT 5 100 (ENDOMECHANICALS) ×2 IMPLANT
TROCAR XCEL 12X100 BLDLESS (ENDOMECHANICALS) IMPLANT

## 2021-02-06 NOTE — Progress Notes (Signed)
Attempted NG tube placement to Rt. Nostril, failed. Kept into lung.

## 2021-02-06 NOTE — Transfer of Care (Signed)
Immediate Anesthesia Transfer of Care Note  Patient: Merian Wroe  Procedure(s) Performed: EXPLORATORY LAPAROTOMY; LYSIS OF ADHESIONS; INCISIONAL HERNIA REPAIR,SMALL BOWEL RESECTION  Patient Location: PACU  Anesthesia Type:General  Level of Consciousness: awake  Airway & Oxygen Therapy: Patient Spontanous Breathing and Patient connected to nasal cannula oxygen  Post-op Assessment: Report given to RN and Post -op Vital signs reviewed and stable  Post vital signs: Reviewed and stable  Last Vitals:  Vitals Value Taken Time  BP 144/75 02/06/21 2315  Temp    Pulse 74 02/06/21 2321  Resp 24 02/06/21 2321  SpO2 95 % 02/06/21 2321  Vitals shown include unvalidated device data.  Last Pain:  Vitals:   02/06/21 1648  TempSrc: Oral  PainSc:          Complications: No notable events documented.

## 2021-02-06 NOTE — Anesthesia Procedure Notes (Signed)
Procedure Name: Intubation Date/Time: 02/06/2021 8:17 PM Performed by: Cleda Daub, CRNA Pre-anesthesia Checklist: Patient identified, Emergency Drugs available, Suction available and Patient being monitored Patient Re-evaluated:Patient Re-evaluated prior to induction Oxygen Delivery Method: Circle system utilized Preoxygenation: Pre-oxygenation with 100% oxygen Induction Type: IV induction, Rapid sequence and Cricoid Pressure applied Laryngoscope Size: Mac and 3 Grade View: Grade I Tube type: Oral Tube size: 7.0 mm Number of attempts: 1 Airway Equipment and Method: Stylet and Oral airway Placement Confirmation: ETT inserted through vocal cords under direct vision, positive ETCO2 and breath sounds checked- equal and bilateral Secured at: 21 cm Tube secured with: Tape Dental Injury: Teeth and Oropharynx as per pre-operative assessment

## 2021-02-06 NOTE — Op Note (Signed)
Preoperative diagnosis: obstructing incisional hernia  Postoperative diagnosis: same   Procedure:  lysis of adhesions 2. Small bowel resection with anastomosis 3. Incisional hernia repair  Surgeon: Feliciana Rossetti, M.D.  Asst: Wenda Low, M.D.  Anesthesia: general  Indications for procedure: Laurie [name] is a 65 y.o. year old female with symptoms of vomiting and work up showing concern for incarcerated small intestine in small incisional hernia.  Description of procedure: The patient was brought into the operative suite. Anesthesia was administered with General endotracheal anesthesia. WHO checklist was applied. The patient was then placed in supine position. The area was prepped and draped in the usual sterile fashion.  Next, a midline incision was made.  Cautery was used to dissect down through subcutaneous tissue.  The fascia was identified.  Fascia was safely entered and there was dense scar of the small intestine to the fascia in the area sharp dissection as well as blunt dissection was used to develop the peritoneal area and the fascial incision was increased.  This allowed digital palpation of the large amount of the fascia which seemed to lead to a small hernia in the supraumbilical area.  Further dissection of subcutaneous tissue confirmed the hernia in the fascial defect of the hernia was connected to the fascial incision.  There was a diverticulum that was in this hernia that was slowly dissected free of the hernia sac.  It was not ischemic or perforated.  Because it was only the diverticulum of this and the hernia sac there was concerned that small bowel obstruction was not related to this hernia.  Additional sharp dissection of small intestine was used to free more of the small intestine away from the abdominal wall.  There was some interloop adhesions that were freed as well.  During this maneuver there was one loop of intestine which was injured full-thickness.    To  continue tissue was scar and complexity of case., Dr. Wenda Low was called in to assist for retraction, dissection, and decision making.  Further dissection was used to free up this loop away from the other loops of intestine.  Injury was removed with 2 GIA 75 staple firings.  The mesentery was divided with cautery and ligated with 2-0 silk.  Once the small intestine loops were freed up enough for a apposition of the 2 ends.  Next an end-to-end handsewn anastomosis was made with 3-0 silk as posterior layer and then a running 3-0 PDS with canal stitch.  And then the anterior 3-0 silk interrupted Lembert sutures.  The abdomen was inspected no further injuries were identified.  The abdomen was irrigated with warm saline.   Fascia was closed with 0 PDS in running fashion.  Fish device was used to avoid injury to the small intestine.  Due to contamination the skin was left open and the wound packed with saline moistened gauze.  Dressing was put in place.  Patient awoke from anesthesia was brought to PACU in stable condition.  All counts are correct.  Findings: dense scar and near frozen abdomen  Specimen: small intestine  Implant: none   Blood loss: 100 ml  Local anesthesia: none  Complications: none  Feliciana Rossetti, M.D. General, Bariatric, & Minimally Invasive Surgery [facility]

## 2021-02-06 NOTE — Anesthesia Preprocedure Evaluation (Addendum)
Anesthesia Evaluation  Patient identified by MRN, date of birth, ID band Patient awake    Reviewed: Allergy & Precautions, NPO status , Patient's Chart, lab work & pertinent test results, reviewed documented beta blocker date and time   Airway Mallampati: III  TM Distance: >3 FB Neck ROM: Full    Dental  (+) Edentulous Upper, Edentulous Lower   Pulmonary asthma , former smoker,    Pulmonary exam normal breath sounds clear to auscultation       Cardiovascular hypertension, Pt. on medications and Pt. on home beta blockers + CAD  Normal cardiovascular exam Rhythm:Regular Rate:Normal     Neuro/Psych negative neurological ROS  negative psych ROS   GI/Hepatic GERD  Medicated and Controlled,(+) Hepatitis -, CVentral hernia- abdominal CT shows persistent trapped bowel and fat protruding into subcutaneous fat without surface changes and proximal dilatation/air-fluid levels.   Endo/Other  diabetes, Well Controlled, Type 2, Oral Hypoglycemic Agents, Insulin DependentObesity BMI 34 a1c 6.2  Renal/GU ARFRenal diseaseCr 4.19  negative genitourinary   Musculoskeletal negative musculoskeletal ROS (+)   Abdominal (+) + obese,   Peds  Hematology negative hematology ROS (+) hct 40.9   Anesthesia Other Findings Chronic LBP, abdominal pain  Reproductive/Obstetrics negative OB ROS                            Anesthesia Physical Anesthesia Plan  ASA: 4  Anesthesia Plan: General   Post-op Pain Management:    Induction: Intravenous  PONV Risk Score and Plan: 4 or greater and Ondansetron, Dexamethasone, Midazolam and Treatment may vary due to age or medical condition  Airway Management Planned: Oral ETT  Additional Equipment: None  Intra-op Plan:   Post-operative Plan: Extubation in OR  Informed Consent: I have reviewed the patients History and Physical, chart, labs and discussed the procedure  including the risks, benefits and alternatives for the proposed anesthesia with the patient or authorized representative who has indicated his/her understanding and acceptance.     Dental advisory given  Plan Discussed with: CRNA  Anesthesia Plan Comments: (Allergies to all pain meds- per pt does ok with pain meds if she has benadryl simultaneously )       Anesthesia Quick Evaluation

## 2021-02-06 NOTE — ED Provider Notes (Addendum)
Pt is transferred from [facility] for a sbo from a ventral hernia.  Pt still c/o pain.  She does have AKI from dehydration.  Surgery did see pt.  They think the hernia has been reduced, but a CT abd/pelvis noncontrast has been ordered to see if it's in.  Dr. Sheliah Hatch will be looking for results.  Pt will need admission to hospitalist.  Pt d/w Dr. Jarvis Newcomer ([facility]) who will admit.   Jacalyn Lefevre, MD 02/06/21 1728   Repeat CT did show:     IMPRESSION:  1. Persistent and unchanged small-bowel obstruction secondary to  umbilical hernia. No evidence of perforation.  2. Status post cystectomy with urinary diversion. Unchanged 15 mm  stone in the ureteral confluence.  3. Colonic diverticulosis without diverticulitis.     Aortic Atherosclerosis (ICD10-I70.0).   Dr. Sheliah Hatch did see her and will take her to the OR tonight.   Jacalyn Lefevre, MD 02/06/21 1919

## 2021-02-06 NOTE — H&P (Signed)
History and Physical   Laurie [name] VOZ:[id number] DOB: Oct 05, 1955 DOA: 02/06/2021  Referring MD/NP/PA: Dr. Particia Nearing, EDP PCP: Margit Hanks, MD  Patient coming from: Home by way of [facility] ED  Chief Complaint: Abdominal pain  HPI: Laurie [name] is a 65 y.o. female with a history of T2DM, HTN, HLD, anxiety/depression, GERD, obesity and multiple abdominopelvic surgeries who presented to the ED with 5 days of worsening lower abdominal pain radiating outward centering in the lower midline. It has been associated with loose stools and vomiting. No medications tried at home, pain progressively worsened, prompting ED visit where CT revealed an incarcerated ventral hernia. EDP attempted reduction though symptoms did not improve. Labs notable for acute renal failure in the setting of dehydration. She was transferred to [facility] ED for surgical consult, repeat CT abdomen pending at time of call for admission.  Review of Systems: No fevers, and per HPI. All others reviewed and are negative.   Past Medical History:  Diagnosis Date   Anal fissure    Asthma    B12 deficiency    Candidal esophagitis (HCC)    Carpal tunnel syndrome    Chronic abdominal pain    Chronic UTI    CKD (chronic kidney disease)    Coronary artery disease    Diabetes mellitus without complication (HCC)    Diabetic gastropathy (HCC)    Episodic low back pain    GERD (gastroesophageal reflux disease)    GI bleed    Glaucoma    Hepatitis C    Hypertension    IBS (irritable bowel syndrome)    Interstitial cystitis    Iron deficiency anemia    Myofascial pain syndrome    Obesity (BMI 35.0-39.9 without comorbidity)    Plantar fasciitis    Past Surgical History:  Procedure Laterality Date   ABDOMINAL HYSTERECTOMY     BILATERAL CARPAL TUNNEL RELEASE     BREAST SURGERY     CHOLECYSTECTOMY     COLONOSCOPY     ESOPHAGOGASTRODUODENOSCOPY     ESOPHAGOGASTRODUODENOSCOPY (EGD) WITH PROPOFOL N/A 08/22/2016    Procedure: ESOPHAGOGASTRODUODENOSCOPY (EGD) WITH PROPOFOL;  Surgeon: Rachael Fee, MD;  Location: [facility];  Service: Endoscopy;  Laterality: N/A;   ESOPHAGOGASTRODUODENOSCOPY (EGD) WITH PROPOFOL N/A 02/11/2017   Procedure: ESOPHAGOGASTRODUODENOSCOPY (EGD) WITH PROPOFOL;  Surgeon: Beverley Fiedler, MD;  Location: [facility];  Service: Gastroenterology;  Laterality: N/A;   INCISION AND DRAINAGE ABSCESS Left 06/25/2015   Procedure: INCISION AND DRAINAGE ABSCESS;  Surgeon: Romie Levee, MD;  Location: [facility];  Service: General;  Laterality: Left;   REVISION UROSTOMY CUTANEOUS     - Nonsmoker.  reports that she has quit smoking. She has never used smokeless tobacco. She reports that she does not drink alcohol and does not use drugs. Allergies  Allergen Reactions   Buchu-[place]sil[place]-Ch Grass-Hydran Other (See Comments)    Dehydration. Elevated creatini Other reaction(s): Other (See Comments) Dehydration, increase in creatinine   Celebrex [Celecoxib] Itching   Ciprofloxacin Other (See Comments)    No history of rash or SOB. Had kidney problems when she took cipro but was not told she had interstitial nephritis. Other reaction(s): Other (See Comments), Unknown Kidney problems   Cymbalta [Duloxetine Hcl]     GI upset / constipation.    Dilaudid [Hydromorphone Hcl] Itching   Fentanyl And Related Itching   Glucophage [Metformin Hcl] Other (See Comments)    Stopped due to increase creat   Oxycodone Itching   Pregabalin Other (See Comments)  CNS disorder. Headache Other reaction(s): Other (See Comments) Headache, CNS disorder   Hydrocodone-Acetaminophen Rash   Morphine And Related Rash and Itching   Sulfa Antibiotics Rash   Vicodin [Hydrocodone-Acetaminophen] Rash   Family History  Problem Relation Age of Onset   CAD Mother    Cancer Father    Stroke Brother    - Family history otherwise reviewed and not pertinent.  Prior to Admission medications   Medication Sig Start Date End  Date Taking? Authorizing Provider  albuterol (PROVENTIL HFA;VENTOLIN HFA) 108 (90 BASE) MCG/ACT inhaler Inhale 2 puffs into the lungs every 4 (four) hours as needed for wheezing or shortness of breath.    [provider]  amLODipine (NORVASC) 10 MG tablet Take 10 mg by mouth daily.     [provider]  aspirin EC 81 MG tablet Take 81 mg by mouth daily.    [provider]  budesonide-formoterol (SYMBICORT) 80-4.5 MCG/ACT inhaler Inhale 2 puffs into the lungs 2 (two) times daily.    [provider]  cholecalciferol (VITAMIN D) 1000 UNITS tablet Take 1,000 Units by mouth daily.    [provider]  colestipol (COLESTID) 1 G tablet Take 1 g by mouth daily.     [provider]  cyanocobalamin 1000 MCG tablet Take 1,000 mcg by mouth daily.     [provider]  cyclobenzaprine (FLEXERIL) 10 MG tablet Take 1 tablet (10 mg total) at bedtime and may repeat dose one time if needed by mouth. 02/15/17   Bethel Born, PA-C  dicyclomine (BENTYL) 20 MG tablet Take 20 mg 2 (two) times daily by mouth. 08/30/16 08/30/17  [provider]  FLUoxetine (PROZAC) 20 MG capsule Take 20 mg by mouth daily.     [provider]  fluticasone (FLONASE) 50 MCG/ACT nasal spray Place 2 sprays into both nostrils daily as needed for allergies or rhinitis.    [provider]  gabapentin (NEURONTIN) 300 MG capsule Take 600 mg by mouth 2 (two) times daily.    [provider]  glucagon (GLUCAGON EMERGENCY) 1 MG injection Inject 1 mg into the vein once as needed (severe insulin reaction).    [provider]  hydrocortisone (ANUSOL-HC) 2.5 % rectal cream Place 1 application rectally 3 (three) times daily as needed for hemorrhoids.     [provider]  isosorbide mononitrate (IMDUR) 60 MG 24 hr tablet Take 60 mg by mouth daily.    [provider]  lidocaine (XYLOCAINE) 5 % ointment Apply 1 application topically 4  (four) times daily as needed for moderate pain.    [provider]  LIRAGLUTIDE [place] Inject 1.8 mg into the skin daily.    [provider]  lisinopril (PRINIVIL,ZESTRIL) 2.5 MG tablet Take 2.5 mg by mouth daily. 08/02/16   [provider]  magnesium oxide (MAG-OX) 400 MG tablet Take 400 mg by mouth 2 (two) times daily.    [provider]  metoprolol succinate (TOPROL-XL) 100 MG 24 hr tablet Take 100 mg by mouth daily. Take with or immediately following a meal.    [provider]  Multiple Vitamin (MULTIVITAMIN WITH MINERALS) TABS tablet Take 1 tablet by mouth daily.    [provider]  nitroGLYCERIN (NITROSTAT) 0.4 MG SL tablet Place 0.4 mg under the tongue every 5 (five) minutes as needed for chest pain.    [provider]  Nitroglycerin (RECTIV) 0.4 % OINT Place 1 application rectally 2 (two) times daily as needed (for chest  pain.).     [provider]  pantoprazole (PROTONIX) 40 MG tablet Take 40 mg by mouth daily.    [provider]  pravastatin (PRAVACHOL) 20 MG tablet Take 20 mg by mouth at bedtime.    [provider]  promethazine (PHENERGAN) 25 MG tablet Take 25 mg by mouth every 6 (six) hours as needed for nausea or vomiting.    [provider]  ranitidine (ZANTAC) 300 MG tablet Take 300 mg by mouth at bedtime.    [provider]  temazepam (RESTORIL) 7.5 MG capsule Take 7.5 mg by mouth at bedtime as needed for sleep.    [provider]  traMADol (ULTRAM) 50 MG tablet Take 1 tablet (50 mg total) every 6 (six) hours as needed by mouth. 02/15/17   Bethel Born, PA-C  traZODone (DESYREL) 50 MG tablet Take 50 mg by mouth at bedtime.    [provider]  triamcinolone cream (KENALOG) 0.5 % Apply 1 application topically 2 (two) times daily as needed (for skin irritation).     [provider]    Physical Exam: Vitals:   02/06/21 1209 02/06/21 1316 02/06/21 1423  02/06/21 1648  BP: 107/76 119/70 118/77 125/78  Pulse: 74 66 68 68  Resp: 12 13 14 16   Temp:    98.1 F (36.7 C)  TempSrc:    Oral  SpO2: 98% 99% 100% 96%  Weight:      Height:       Constitutional: 65 y.o. female in evident discomfort  Eyes: Lids and conjunctivae normal, PERRL ENMT: Mucous membranes are very dry. Posterior pharynx clear of any exudate or lesions. Absent dentition.  Neck: normal, supple, no masses, no thyromegaly Respiratory: Non-labored breathing room air without accessory muscle use. Clear breath sounds to auscultation bilaterally Cardiovascular: Regular rate and rhythm, no murmurs, rubs, or gallops. No carotid bruits. No definite JVD. No LE edema. Palpable pedal pulses. Abdomen: Distant hypoactive bowel sounds, tender across lower abdomen diffusely. Obesity limits ability to palpate hernia, though does seem to protrude with cough which causes pain.   GU: No indwelling catheter Musculoskeletal: No clubbing / cyanosis. No joint deformity upper and lower extremities. Good ROM, no contractures. Normal muscle tone.  Skin: Warm, dry. No rashes, wounds, or ulcers on visualized skin. There are many abdominal surgical scars. Neurologic: CN II-XII grossly intact. Speech normal. No focal deficits in motor strength or sensation in all extremities.  Psychiatric: Alert and oriented x3. Normal judgment and insight. Mood euthymic with pleasant affect.   Labs on Admission: I have personally reviewed following labs and imaging studies  CBC: Recent Labs  Lab 02/06/21 1155  WBC 11.1*  NEUTROABS 7.9*  HGB 13.8  HCT 40.9  MCV 94.5  PLT 507*   Basic Metabolic Panel: Recent Labs  Lab 02/06/21 1315  NA 137  K 5.1  CL 102  CO2 26  GLUCOSE 130*  BUN 34*  CREATININE 4.19*  CALCIUM 9.4   GFR: Estimated Creatinine Clearance: 14.4 mL/min (A) (by C-G formula based on SCr of 4.19 mg/dL (H)). Liver Function Tests: Recent Labs  Lab 02/06/21 1315  AST 22  ALT 18  ALKPHOS 58   BILITOT 0.4  PROT 8.0  ALBUMIN 4.1   Recent Labs  Lab 02/06/21 1315  LIPASE 34   No results for input(s): AMMONIA in the last 168 hours. Coagulation Profile: No results for input(s): INR, PROTIME in the last 168 hours. Cardiac Enzymes: No results for input(s): CKTOTAL, CKMB, CKMBINDEX,  TROPONINI in the last 168 hours. BNP (last 3 results) No results for input(s): PROBNP in the last 8760 hours. HbA1C: No results for input(s): HGBA1C in the last 72 hours. CBG: No results for input(s): GLUCAP in the last 168 hours. Lipid Profile: No results for input(s): CHOL, HDL, LDLCALC, TRIG, CHOLHDL, LDLDIRECT in the last 72 hours. Thyroid Function Tests: No results for input(s): TSH, T4TOTAL, FREET4, T3FREE, THYROIDAB in the last 72 hours. Anemia Panel: No results for input(s): VITAMINB12, FOLATE, FERRITIN, TIBC, IRON, RETICCTPCT in the last 72 hours. Urine analysis:    Component Value Date/Time   COLORURINE YELLOW 06/28/2015 0930   APPEARANCEUR CLEAR 06/28/2015 0930   LABSPEC 1.012 06/28/2015 0930   PHURINE 7.0 06/28/2015 0930   GLUCOSEU NEGATIVE 06/28/2015 0930   HGBUR NEGATIVE 06/28/2015 0930   BILIRUBINUR NEGATIVE 06/28/2015 0930   KETONESUR NEGATIVE 06/28/2015 0930   PROTEINUR NEGATIVE 06/28/2015 0930   UROBILINOGEN 0.2 12/28/2014 2013   NITRITE NEGATIVE 06/28/2015 0930   LEUKOCYTESUR NEGATIVE 06/28/2015 0930    Recent Results (from the past 240 hour(s))  Resp Panel by RT-PCR (Flu A&B, Covid) Nasopharyngeal Swab     Status: None   Collection Time: 02/06/21 11:55 AM   Specimen: Nasopharyngeal Swab; Nasopharyngeal(NP) swabs in vial transport medium  Result Value Ref Range Status   SARS Coronavirus 2 by RT PCR NEGATIVE NEGATIVE Final    Comment: (NOTE) SARS-CoV-2 target nucleic acids are NOT DETECTED.  The SARS-CoV-2 RNA is generally detectable in upper respiratory specimens during the acute phase of infection. The lowest concentration of SARS-CoV-2 viral copies this assay  can detect is 138 copies/mL. A negative result does not preclude SARS-Cov-2 infection and should not be used as the sole basis for treatment or other patient management decisions. A negative result may occur with  improper specimen collection/handling, submission of specimen other than nasopharyngeal swab, presence of viral mutation(s) within the areas targeted by this assay, and inadequate number of viral copies(<138 copies/mL). A negative result must be combined with clinical observations, patient history, and epidemiological information. The expected result is Negative.  Fact Sheet for Patients:  BloggerCourse.com  Fact Sheet for Healthcare Providers:  SeriousBroker.it  This test is no t yet approved or cleared by the [place] FDA and  has been authorized for detection and/or diagnosis of SARS-CoV-2 by FDA under an Emergency Use Authorization (EUA). This EUA will remain  in effect (meaning this test can be used) for the duration of the COVID-19 declaration under Section 564(b)(1) of the Act, 21 U.S.C.section 360bbb-3(b)(1), unless the authorization is terminated  or revoked sooner.       Influenza A by PCR NEGATIVE NEGATIVE Final   Influenza B by PCR NEGATIVE NEGATIVE Final    Comment: (NOTE) The Xpert Xpress SARS-CoV-2/FLU/RSV plus assay is intended as an aid in the diagnosis of influenza from Nasopharyngeal swab specimens and should not be used as a sole basis for treatment. Nasal washings and aspirates are unacceptable for Xpert Xpress SARS-CoV-2/FLU/RSV testing.  Fact Sheet for Patients: BloggerCourse.com  Fact Sheet for Healthcare Providers: SeriousBroker.it  This test is not yet approved or cleared by the [place] FDA and has been authorized for detection and/or diagnosis of SARS-CoV-2 by FDA under an Emergency Use Authorization (EUA). This EUA will  remain in effect (meaning this test can be used) for the duration of the COVID-19 declaration under Section 564(b)(1) of the Act, 21 U.S.C. section 360bbb-3(b)(1), unless the authorization is terminated or revoked.  Performed at [facility], [address], [place], [place] [address]      Radiological Exams on Admission: CT ABDOMEN PELVIS WO CONTRAST  Result Date: 02/06/2021 CLINICAL DATA:  Abdominal pain, hernia suspected. Status post manual attempted reduction. Vomiting and diarrhea history of urinary diversion. EXAM: CT ABDOMEN AND PELVIS WITHOUT CONTRAST TECHNIQUE: Multidetector CT imaging of the abdomen and pelvis was performed following the standard protocol without IV contrast. COMPARISON:  CT 2-1/2 hours ago. FINDINGS: Lower chest: Stable chronic right lung base scarring. No acute findings or change from earlier today. Hepatobiliary: No focal liver abnormality is seen. Status post cholecystectomy. No biliary dilatation. Pancreas: Mild parenchymal atrophy. No ductal dilatation or inflammation. Spleen: Normal in size without focal abnormality. Adrenals/Urinary Tract: No adrenal nodule. Stable mild left adrenal thickening. There is no hydronephrosis or renal calculi. Status post cystectomy with urinary diversion. No ureteral dilatation. Stable 15 mm stone in the ureteral confluence, series 2, image 78. The ostomy is nondilated. Stomach/Bowel: Small-bowel obstruction secondary to umbilical hernia without significant change in the interim. Prominently fluid-filled distended stomach. There is fluid distending the distal esophagus. Small bowel is dilated and fluid-filled to the level of umbilical hernia. The exiting small bowel is completely decompressed. The hernia neck spans 13 mm, measured on series 2, image 56. There is minimal mesenteric edema. No bowel pneumatosis. Small bowel distal to the hernia is decompressed. Ileal colonic anastomosis in the right upper quadrant there  is small volume of colonic stool. Distal descending and sigmoid colonic diverticulosis without diverticulitis. Vascular/Lymphatic: Aortic atherosclerosis. No aortic aneurysm. No portal venous or mesenteric gas. No bulky abdominopelvic adenopathy. Reproductive: Hysterectomy.  No adnexal mass. Other: No ascites or free air. Umbilical hernia containing a short segment of small bowel with associated small bowel obstruction. There is skin and soft tissue thickening of the left anterior abdominal wall, typical of medication injection site. Small fat containing inguinal hernias. Musculoskeletal: Stable from earlier today. IMPRESSION: 1. Persistent and unchanged small-bowel obstruction secondary to umbilical hernia. No evidence of perforation. 2. Status post cystectomy with urinary diversion. Unchanged 15 mm stone in the ureteral confluence. 3. Colonic diverticulosis without diverticulitis. Aortic Atherosclerosis (ICD10-I70.0). Electronically Signed   By: Narda Rutherford M.D.   On: 02/06/2021 18:01   CT ABDOMEN PELVIS WO CONTRAST  Result Date: 02/06/2021 CLINICAL DATA:  Nausea, diarrhea and vomiting. Diverticulitis suspected. History of interstitial cystitis and status post cystectomy with urinary diversion. EXAM: CT ABDOMEN AND PELVIS WITHOUT CONTRAST TECHNIQUE: Multidetector CT imaging of the abdomen and pelvis was performed following the standard protocol without IV contrast. COMPARISON:  06/24/2015 and 12/28/2014 FINDINGS: Lower chest: Chronic volume loss or scarring in the medial right lung base. Otherwise, lung bases are clear. No pleural effusions. Hepatobiliary: Cholecystectomy.  Normal appearance of liver. Pancreas: Unremarkable. No pancreatic ductal dilatation or surrounding inflammatory changes. Spleen: Normal in size without focal abnormality. Adrenals/Urinary Tract: Normal appearance of the adrenal glands. Kidneys are slightly small for size. No hydronephrosis or ureter stones. Status post cystectomy with  urinary diversion or ostomy in the right lower quadrant. Ileal conduit is not distended but there is a large stone within the conduit that measures 1.5 cm and this is new. No dilatation of the ureters. Stomach/Bowel: Stomach is distended with air-fluid level. Dilated loops of proximal small bowel containing fluid. Transition point appears to be related to a supraumbilical ventral hernia. The small bowel distal to the hernia is decompressed. Evidence for an ileocolic anastomosis in the right abdomen. No colonic dilatation. Multiple colonic diverticula particularly in the  sigmoid colon. No colonic inflammation. Vascular/Lymphatic: Atherosclerotic calcifications in the abdominal aorta and iliac arteries without aneurysm. No significant lymph node enlargement in the abdomen or pelvis. Reproductive: Status post hysterectomy. No adnexal masses. Other: Negative for free fluid. Negative for free air. Small left inguinal hernia containing fat. Musculoskeletal: Degenerative facet arthropathy in the lower lumbar spine. Chronic areas of sclerosis in the lower thoracic spine. Chronic skin and superficial soft tissue thickening in the left anterior abdomen. IMPRESSION: 1. Small bowel obstruction secondary to an incarcerated ventral hernia. Distended stomach and small bowel loops with a transition point associated with a ventral hernia. No evidence for free fluid. 2. Postsurgical changes associated with a cystectomy and ileal conduit. Negative for hydronephrosis. However, there is a large stone within the iliac conduit measuring up to 1.5 cm. 3.  Aortic Atherosclerosis (ICD10-I70.0). 4. Colonic diverticulosis but no evidence for acute diverticulitis. These results were called by telephone at the time of interpretation on 02/06/2021 at 3:04 pm to provider Pricilla Loveless , who verbally acknowledged these results. Electronically Signed   By: Richarda Overlie M.D.   On: 02/06/2021 15:05    EKG: Ordered  Assessment/Plan Active  Problems:   CAD (coronary artery disease)   Essential hypertension   Acute renal failure (ARF) (HCC)   Diabetes mellitus without complication (HCC)   Incarcerated ventral hernia   SBO (small bowel obstruction) (HCC)   Obesity   65yo F presenteing with diarrhea and abdominal pain found to have incrcerated ventral hernia and evidence of proximal obstruction. She's been having GI losses with vomiting and very little per oral intake for days, appears very dehydrated. Given IVF boluses in ED, will continue high rate IVF, no overload by exam. Monitor metabolic parameters in AM, pursue further work up if not responding to IV fluids. Avoid hypotension and contrast if possible.   Repeat abdominal CT shows persistent trapped bowel and fat protruding into subcutaneous fat without surface changes and proximal dilatation/air-fluid levels. Discussed with surgery, Dr. Sheliah Hatch who is aware. Will keep pt NPO for surgery. Pt has had no anginal equivalents of late, can walk up a flight of stairs but would need a rest break, no signs or symptoms of CHF at this time. Will order preoperative ECG, though vitals are stable and she's reasonably optimized for surgery with moderate risk.  Holding home medications for now. Give very sensitive SSI given she's insulin naive and in renal failure.   DVT prophylaxis: Heparin   Code Status: Full  Family Communication: None at bedside Disposition Plan: Return home Consults called: Surgery, Dr. Sheliah Hatch  Admission status: Observation    Tyrone Nine, MD [facility] [email or web address] 02/06/2021, 6:41 PM

## 2021-02-06 NOTE — ED Notes (Addendum)
Pt. BIB [facility] from [facility] c/o N&V x5 days. Transfer due to bowel obstruction. Currently c/o abdominal pain as a 10/10.  EMS VS:  BP:135/68 HR: 60 O2: 95%

## 2021-02-06 NOTE — Consult Note (Signed)
Reason for Consult:bowel obstruction Referring Provider: Pricilla Loveless, M.D.  Laurie [name] is an 65 y.o. female.  HPI: 65 yo female with 5 days fo worsening abdominal pain and vomiting. Pain is in her medial abdominal. It does not radiate. It is sharp. It has worsened over the time. Pain medication and rest helps some. Movement makes it worse.  Past Medical History:  Diagnosis Date   Anal fissure    Asthma    B12 deficiency    Candidal esophagitis (HCC)    Carpal tunnel syndrome    Chronic abdominal pain    Chronic UTI    CKD (chronic kidney disease)    Coronary artery disease    Diabetes mellitus without complication (HCC)    Diabetic gastropathy (HCC)    Episodic low back pain    GERD (gastroesophageal reflux disease)    GI bleed    Glaucoma    Hepatitis C    Hypertension    IBS (irritable bowel syndrome)    Interstitial cystitis    Iron deficiency anemia    Myofascial pain syndrome    Obesity (BMI 35.0-39.9 without comorbidity)    Plantar fasciitis     Past Surgical History:  Procedure Laterality Date   ABDOMINAL HYSTERECTOMY     BILATERAL CARPAL TUNNEL RELEASE     BREAST SURGERY     CHOLECYSTECTOMY     COLONOSCOPY     ESOPHAGOGASTRODUODENOSCOPY     ESOPHAGOGASTRODUODENOSCOPY (EGD) WITH PROPOFOL N/A 08/22/2016   Procedure: ESOPHAGOGASTRODUODENOSCOPY (EGD) WITH PROPOFOL;  Surgeon: Rachael Fee, MD;  Location: [facility];  Service: Endoscopy;  Laterality: N/A;   ESOPHAGOGASTRODUODENOSCOPY (EGD) WITH PROPOFOL N/A 02/11/2017   Procedure: ESOPHAGOGASTRODUODENOSCOPY (EGD) WITH PROPOFOL;  Surgeon: Beverley Fiedler, MD;  Location: [facility];  Service: Gastroenterology;  Laterality: N/A;   INCISION AND DRAINAGE ABSCESS Left 06/25/2015   Procedure: INCISION AND DRAINAGE ABSCESS;  Surgeon: Romie Levee, MD;  Location: [facility];  Service: General;  Laterality: Left;   REVISION UROSTOMY CUTANEOUS      Family History  Problem Relation Age of Onset   CAD Mother     Cancer Father    Stroke Brother     Social History:  reports that she has quit smoking. She has never used smokeless tobacco. She reports that she does not drink alcohol and does not use drugs.  Allergies:  Allergies  Allergen Reactions   Buchu-Cornsilk-Ch Grass-Hydran Other (See Comments)    Dehydration. Elevated creatini Other reaction(s): Other (See Comments) Dehydration, increase in creatinine   Celebrex [Celecoxib] Itching   Ciprofloxacin Other (See Comments)    No history of rash or SOB. Had kidney problems when she took cipro but was not told she had interstitial nephritis. Other reaction(s): Other (See Comments), Unknown Kidney problems   Cymbalta [Duloxetine Hcl]     GI upset / constipation.    Dilaudid [Hydromorphone Hcl] Itching   Fentanyl And Related Itching   Glucophage [Metformin Hcl] Other (See Comments)    Stopped due to increase creat   Oxycodone Itching   Pregabalin Other (See Comments)    CNS disorder. Headache Other reaction(s): Other (See Comments) Headache, CNS disorder   Hydrocodone-Acetaminophen Rash   Morphine And Related Rash and Itching   Sulfa Antibiotics Rash   Vicodin [Hydrocodone-Acetaminophen] Rash    Medications: I have reviewed the patient's current medications.  Results for orders placed or performed during the hospital encounter of 02/06/21 (from the past 48 hour(s))  CBC with Differential  Status: Abnormal   Collection Time: 02/06/21 11:55 AM  Result Value Ref Range   WBC 11.1 (H) 4.0 - 10.5 K/uL   RBC 4.33 3.87 - 5.11 MIL/uL   Hemoglobin 13.8 12.0 - 15.0 g/dL   HCT 04.5 40.9 - 81.1 %   MCV 94.5 80.0 - 100.0 fL   MCH 31.9 26.0 - 34.0 pg   MCHC 33.7 30.0 - 36.0 g/dL   RDW 91.4 78.2 - 95.6 %   Platelets 507 (H) 150 - 400 K/uL   nRBC 0.0 0.0 - 0.2 %   Neutrophils Relative % 71 %   Neutro Abs 7.9 (H) 1.7 - 7.7 K/uL   Lymphocytes Relative 13 %   Lymphs Abs 1.5 0.7 - 4.0 K/uL   Monocytes Relative 13 %   Monocytes Absolute  1.4 (H) 0.1 - 1.0 K/uL   Eosinophils Relative 1 %   Eosinophils Absolute 0.1 0.0 - 0.5 K/uL   Basophils Relative 0 %   Basophils Absolute 0.0 0.0 - 0.1 K/uL   Immature Granulocytes 2 %   Abs Immature Granulocytes 0.23 (H) 0.00 - 0.07 K/uL    Comment: Performed at [facility], [address] [place], [place] [address]  Resp Panel by RT-PCR (Flu A&B, Covid) Nasopharyngeal Swab     Status: None   Collection Time: 02/06/21 11:55 AM   Specimen: Nasopharyngeal Swab; Nasopharyngeal(NP) swabs in vial transport medium  Result Value Ref Range   SARS Coronavirus 2 by RT PCR NEGATIVE NEGATIVE    Comment: (NOTE) SARS-CoV-2 target nucleic acids are NOT DETECTED.  The SARS-CoV-2 RNA is generally detectable in upper respiratory specimens during the acute phase of infection. The lowest concentration of SARS-CoV-2 viral copies this assay can detect is 138 copies/mL. A negative result does not preclude SARS-Cov-2 infection and should not be used as the sole basis for treatment or other patient management decisions. A negative result may occur with  improper specimen collection/handling, submission of specimen other than nasopharyngeal swab, presence of viral mutation(s) within the areas targeted by this assay, and inadequate number of viral copies(<138 copies/mL). A negative result must be combined with clinical observations, patient history, and epidemiological information. The expected result is Negative.  Fact Sheet for Patients:  BloggerCourse.com  Fact Sheet for Healthcare Providers:  SeriousBroker.it  This test is no t yet approved or cleared by the [place] FDA and  has been authorized for detection and/or diagnosis of SARS-CoV-2 by FDA under an Emergency Use Authorization (EUA). This EUA will remain  in effect (meaning this test can be used) for the duration of the COVID-19 declaration under Section 564(b)(1) of  the Act, 21 U.S.C.section 360bbb-3(b)(1), unless the authorization is terminated  or revoked sooner.       Influenza A by PCR NEGATIVE NEGATIVE   Influenza B by PCR NEGATIVE NEGATIVE    Comment: (NOTE) The Xpert Xpress SARS-CoV-2/FLU/RSV plus assay is intended as an aid in the diagnosis of influenza from Nasopharyngeal swab specimens and should not be used as a sole basis for treatment. Nasal washings and aspirates are unacceptable for Xpert Xpress SARS-CoV-2/FLU/RSV testing.  Fact Sheet for Patients: BloggerCourse.com  Fact Sheet for Healthcare Providers: SeriousBroker.it  This test is not yet approved or cleared by the [place] FDA and has been authorized for detection and/or diagnosis of SARS-CoV-2 by FDA under an Emergency Use Authorization (EUA). This EUA will remain in effect (meaning this test can be used) for the duration of the COVID-19 declaration under Section 564(b)(1)  of the Act, 21 U.S.C. section 360bbb-3(b)(1), unless the authorization is terminated or revoked.  Performed at [facility], [address], [place], [place] [address]   Comprehensive metabolic panel     Status: Abnormal   Collection Time: 02/06/21  1:15 PM  Result Value Ref Range   Sodium 137 135 - 145 mmol/L   Potassium 5.1 3.5 - 5.1 mmol/L   Chloride 102 98 - 111 mmol/L   CO2 26 22 - 32 mmol/L   Glucose, Bld 130 (H) 70 - 99 mg/dL    Comment: Glucose reference range applies only to samples taken after fasting for at least 8 hours.   BUN 34 (H) 8 - 23 mg/dL   Creatinine, Ser 3.82 (H) 0.44 - 1.00 mg/dL   Calcium 9.4 8.9 - 50.5 mg/dL   Total Protein 8.0 6.5 - 8.1 g/dL   Albumin 4.1 3.5 - 5.0 g/dL   AST 22 15 - 41 U/L   ALT 18 0 - 44 U/L   Alkaline Phosphatase 58 38 - 126 U/L   Total Bilirubin 0.4 0.3 - 1.2 mg/dL   GFR, Estimated 11 (L) >60 mL/min    Comment: (NOTE) Calculated using the CKD-EPI Creatinine Equation  (2021)    Anion gap 9 5 - 15    Comment: Performed at [facility], [address] [place], [place] [address]  Lipase, blood     Status: None   Collection Time: 02/06/21  1:15 PM  Result Value Ref Range   Lipase 34 11 - 51 U/L    Comment: Performed at [facility], [address] [place], [place] [address]    CT ABDOMEN PELVIS WO CONTRAST  Result Date: 02/06/2021 CLINICAL DATA:  Abdominal pain, hernia suspected. Status post manual attempted reduction. Vomiting and diarrhea history of urinary diversion. EXAM: CT ABDOMEN AND PELVIS WITHOUT CONTRAST TECHNIQUE: Multidetector CT imaging of the abdomen and pelvis was performed following the standard protocol without IV contrast. COMPARISON:  CT 2-1/2 hours ago. FINDINGS: Lower chest: Stable chronic right lung base scarring. No acute findings or change from earlier today. Hepatobiliary: No focal liver abnormality is seen. Status post cholecystectomy. No biliary dilatation. Pancreas: Mild parenchymal atrophy. No ductal dilatation or inflammation. Spleen: Normal in size without focal abnormality. Adrenals/Urinary Tract: No adrenal nodule. Stable mild left adrenal thickening. There is no hydronephrosis or renal calculi. Status post cystectomy with urinary diversion. No ureteral dilatation. Stable 15 mm stone in the ureteral confluence, series 2, image 78. The ostomy is nondilated. Stomach/Bowel: Small-bowel obstruction secondary to umbilical hernia without significant change in the interim. Prominently fluid-filled distended stomach. There is fluid distending the distal esophagus. Small bowel is dilated and fluid-filled to the level of umbilical hernia. The exiting small bowel is completely decompressed. The hernia neck spans 13 mm, measured on series 2, image 56. There is minimal mesenteric edema. No bowel pneumatosis. Small bowel distal to the hernia is decompressed. Ileal colonic anastomosis in the right upper  quadrant there is small volume of colonic stool. Distal descending and sigmoid colonic diverticulosis without diverticulitis. Vascular/Lymphatic: Aortic atherosclerosis. No aortic aneurysm. No portal venous or mesenteric gas. No bulky abdominopelvic adenopathy. Reproductive: Hysterectomy.  No adnexal mass. Other: No ascites or free air. Umbilical hernia containing a short segment of small bowel with associated small bowel obstruction. There is skin and soft tissue thickening of the left anterior abdominal wall, typical of medication injection site. Small fat containing inguinal hernias. Musculoskeletal: Stable from earlier today. IMPRESSION: 1. Persistent  and unchanged small-bowel obstruction secondary to umbilical hernia. No evidence of perforation. 2. Status post cystectomy with urinary diversion. Unchanged 15 mm stone in the ureteral confluence. 3. Colonic diverticulosis without diverticulitis. Aortic Atherosclerosis (ICD10-I70.0). Electronically Signed   By: Narda Rutherford M.D.   On: 02/06/2021 18:01   CT ABDOMEN PELVIS WO CONTRAST  Result Date: 02/06/2021 CLINICAL DATA:  Nausea, diarrhea and vomiting. Diverticulitis suspected. History of interstitial cystitis and status post cystectomy with urinary diversion. EXAM: CT ABDOMEN AND PELVIS WITHOUT CONTRAST TECHNIQUE: Multidetector CT imaging of the abdomen and pelvis was performed following the standard protocol without IV contrast. COMPARISON:  06/24/2015 and 12/28/2014 FINDINGS: Lower chest: Chronic volume loss or scarring in the medial right lung base. Otherwise, lung bases are clear. No pleural effusions. Hepatobiliary: Cholecystectomy.  Normal appearance of liver. Pancreas: Unremarkable. No pancreatic ductal dilatation or surrounding inflammatory changes. Spleen: Normal in size without focal abnormality. Adrenals/Urinary Tract: Normal appearance of the adrenal glands. Kidneys are slightly small for size. No hydronephrosis or ureter stones. Status post  cystectomy with urinary diversion or ostomy in the right lower quadrant. Ileal conduit is not distended but there is a large stone within the conduit that measures 1.5 cm and this is new. No dilatation of the ureters. Stomach/Bowel: Stomach is distended with air-fluid level. Dilated loops of proximal small bowel containing fluid. Transition point appears to be related to a supraumbilical ventral hernia. The small bowel distal to the hernia is decompressed. Evidence for an ileocolic anastomosis in the right abdomen. No colonic dilatation. Multiple colonic diverticula particularly in the sigmoid colon. No colonic inflammation. Vascular/Lymphatic: Atherosclerotic calcifications in the abdominal aorta and iliac arteries without aneurysm. No significant lymph node enlargement in the abdomen or pelvis. Reproductive: Status post hysterectomy. No adnexal masses. Other: Negative for free fluid. Negative for free air. Small left inguinal hernia containing fat. Musculoskeletal: Degenerative facet arthropathy in the lower lumbar spine. Chronic areas of sclerosis in the lower thoracic spine. Chronic skin and superficial soft tissue thickening in the left anterior abdomen. IMPRESSION: 1. Small bowel obstruction secondary to an incarcerated ventral hernia. Distended stomach and small bowel loops with a transition point associated with a ventral hernia. No evidence for free fluid. 2. Postsurgical changes associated with a cystectomy and ileal conduit. Negative for hydronephrosis. However, there is a large stone within the iliac conduit measuring up to 1.5 cm. 3.  Aortic Atherosclerosis (ICD10-I70.0). 4. Colonic diverticulosis but no evidence for acute diverticulitis. These results were called by telephone at the time of interpretation on 02/06/2021 at 3:04 pm to provider Pricilla Loveless , who verbally acknowledged these results. Electronically Signed   By: Richarda Overlie M.D.   On: 02/06/2021 15:05    Review of Systems   Constitutional: Negative.   HENT: Negative.    Eyes: Negative.   Respiratory: Negative.    Cardiovascular: Negative.   Gastrointestinal:  Positive for abdominal pain, nausea and vomiting.  Genitourinary: Negative.   Musculoskeletal: Negative.   Skin: Negative.   Neurological: Negative.   Endo/Heme/Allergies: Negative.   Psychiatric/Behavioral: Negative.     PE Blood pressure 125/78, pulse 68, temperature 98.1 F (36.7 C), temperature source Oral, resp. rate 16, height 5\' 4"  (1.626 m), weight 90.7 kg, SpO2 96 %. Constitutional: NAD; conversant; no deformities Eyes: Moist conjunctiva; no lid lag; anicteric; PERRL Neck: Trachea midline; no thyromegaly Lungs: Normal respiratory effort; no tactile fremitus CV: RRR; no palpable thrills; no pitting edema GI: Abd tender in mid abdomen; no palpable hepatosplenomegaly MSK: Normal gait;  no clubbing/cyanosis Psychiatric: Appropriate affect; alert and oriented x3 Lymphatic: No palpable cervical or axillary lymphadenopathy Skin: No major subcutaneous nodules. Warm and dry   Assessment/Plan: 65 yo female with bowel obstruction related to hernia. -IV abx -plan for exploration tonight. I discussed plan with patient for operation with details of laparoscopic approach with goals of reducing the hernia and closing the hernia and possible need for bowel resection or larger incision. We discussed risks of bleeding, infection, injury to abdominal structures, nasogastric tube insertion, and general anesthesia. She showed good understanding and wanted to proceed.  De Blanch Jorge Retz 02/06/2021, 7:09 PM

## 2021-02-06 NOTE — ED Provider Notes (Signed)
[facility] EMERGENCY DEPT Provider Note   CSN: [id number] Arrival date & time: 02/06/21  1038     History Chief Complaint  Patient presents with   Abdominal Pain   Diarrhea    Laurie [name] is a 65 y.o. female.  HPI 65 year old female presents with abdominal pain.  The symptoms of been ongoing for about 5 days.  She has had severe abdominal pain, mostly lower as well as vomiting and diarrhea.  Anytime she eats she has to go to the bathroom.  No hematemesis or hematochezia.  She did note some blood in her urine at the beginning of this but no further.  No fevers but her abdomen has felt hot.  No new back pain.  Has not taken anything for the pain.  Past Medical History:  Diagnosis Date   Anal fissure    Asthma    B12 deficiency    Candidal esophagitis (HCC)    Carpal tunnel syndrome    Chronic abdominal pain    Chronic UTI    CKD (chronic kidney disease)    Coronary artery disease    Diabetes mellitus without complication (HCC)    Diabetic gastropathy (HCC)    Episodic low back pain    GERD (gastroesophageal reflux disease)    GI bleed    Glaucoma    Hepatitis C    Hypertension    IBS (irritable bowel syndrome)    Interstitial cystitis    Iron deficiency anemia    Myofascial pain syndrome    Obesity (BMI 35.0-39.9 without comorbidity)    Plantar fasciitis     Patient Active Problem List   Diagnosis Date Noted   Esophageal obstruction due to food impaction    Esophageal foreign body, initial encounter    Food impaction of esophagus    AKI (acute kidney injury) (HCC) 07/01/2015   Cellulitis of buttock 06/24/2015   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) 06/24/2015   CAD (coronary artery disease) 06/24/2015   Essential hypertension 06/24/2015   Asthma 06/24/2015   UTI (lower urinary tract infection) 06/24/2015   Candidal esophagitis (HCC) 06/24/2015   Perianal abscess 06/24/2015   Cellulitis of left buttock     Past Surgical History:   Procedure Laterality Date   ABDOMINAL HYSTERECTOMY     BILATERAL CARPAL TUNNEL RELEASE     BREAST SURGERY     CHOLECYSTECTOMY     COLONOSCOPY     ESOPHAGOGASTRODUODENOSCOPY     ESOPHAGOGASTRODUODENOSCOPY (EGD) WITH PROPOFOL N/A 08/22/2016   Procedure: ESOPHAGOGASTRODUODENOSCOPY (EGD) WITH PROPOFOL;  Surgeon: Rachael Fee, MD;  Location: [facility];  Service: Endoscopy;  Laterality: N/A;   ESOPHAGOGASTRODUODENOSCOPY (EGD) WITH PROPOFOL N/A 02/11/2017   Procedure: ESOPHAGOGASTRODUODENOSCOPY (EGD) WITH PROPOFOL;  Surgeon: Beverley Fiedler, MD;  Location: [facility];  Service: Gastroenterology;  Laterality: N/A;   INCISION AND DRAINAGE ABSCESS Left 06/25/2015   Procedure: INCISION AND DRAINAGE ABSCESS;  Surgeon: Romie Levee, MD;  Location: [facility];  Service: General;  Laterality: Left;   REVISION UROSTOMY CUTANEOUS       OB History   No obstetric history on file.     Family History  Problem Relation Age of Onset   CAD Mother    Cancer Father    Stroke Brother     Social History   Tobacco Use   Smoking status: Former   Smokeless tobacco: Never  Substance Use Topics   Alcohol use: No   Drug use: No    Home Medications  Prior to Admission medications   Medication Sig Start Date End Date Taking? Authorizing Provider  albuterol (PROVENTIL HFA;VENTOLIN HFA) 108 (90 BASE) MCG/ACT inhaler Inhale 2 puffs into the lungs every 4 (four) hours as needed for wheezing or shortness of breath.    [provider]  amLODipine (NORVASC) 10 MG tablet Take 10 mg by mouth daily.     [provider]  aspirin EC 81 MG tablet Take 81 mg by mouth daily.    [provider]  budesonide-formoterol (SYMBICORT) 80-4.5 MCG/ACT inhaler Inhale 2 puffs into the lungs 2 (two) times daily.    [provider]  cholecalciferol (VITAMIN D) 1000 UNITS tablet Take 1,000 Units by mouth daily.    [provider]  colestipol (COLESTID) 1 G tablet Take 1 g by mouth daily.      [provider]  cyanocobalamin 1000 MCG tablet Take 1,000 mcg by mouth daily.     [provider]  cyclobenzaprine (FLEXERIL) 10 MG tablet Take 1 tablet (10 mg total) at bedtime and may repeat dose one time if needed by mouth. 02/15/17   Recardo Evangelist, PA-C  dicyclomine (BENTYL) 20 MG tablet Take 20 mg 2 (two) times daily by mouth. 08/30/16 08/30/17  [provider]  FLUoxetine (PROZAC) 20 MG capsule Take 20 mg by mouth daily.     [provider]  fluticasone (FLONASE) 50 MCG/ACT nasal spray Place 2 sprays into both nostrils daily as needed for allergies or rhinitis.    [provider]  gabapentin (NEURONTIN) 300 MG capsule Take 600 mg by mouth 2 (two) times daily.    [provider]  glucagon (GLUCAGON EMERGENCY) 1 MG injection Inject 1 mg into the vein once as needed (severe insulin reaction).    [provider]  hydrocortisone (ANUSOL-HC) 2.5 % rectal cream Place 1 application rectally 3 (three) times daily as needed for hemorrhoids.     [provider]  isosorbide mononitrate (IMDUR) 60 MG 24 hr tablet Take 60 mg by mouth daily.    [provider]  lidocaine (XYLOCAINE) 5 % ointment Apply 1 application topically 4 (four) times daily as needed for moderate pain.    [provider]  LIRAGLUTIDE [place] Inject 1.8 mg into the skin daily.    [provider]  lisinopril (PRINIVIL,ZESTRIL) 2.5 MG tablet Take 2.5 mg by mouth daily. 08/02/16   [provider]  magnesium oxide (MAG-OX) 400 MG tablet Take 400 mg by mouth 2 (two) times daily.    [provider]  metoprolol succinate (TOPROL-XL) 100 MG 24 hr tablet Take 100 mg by mouth daily. Take with or immediately following a meal.    [provider]  Multiple Vitamin (MULTIVITAMIN WITH MINERALS) TABS tablet Take 1 tablet by mouth daily.    [provider]  nitroGLYCERIN (NITROSTAT) 0.4 MG SL tablet Place 0.4 mg under the  tongue every 5 (five) minutes as needed for chest pain.    [provider]  Nitroglycerin (RECTIV) 0.4 % OINT Place 1 application rectally 2 (two) times daily as needed (for chest pain.).     [provider]  pantoprazole (PROTONIX) 40 MG tablet Take 40 mg by mouth daily.    [provider]  pravastatin (PRAVACHOL) 20 MG tablet Take 20 mg by mouth at bedtime.    [provider]  promethazine (PHENERGAN) 25 MG tablet Take 25 mg by mouth every 6 (six) hours as needed for nausea or vomiting.  [provider]  ranitidine (ZANTAC) 300 MG tablet Take 300 mg by mouth at bedtime.    [provider]  temazepam (RESTORIL) 7.5 MG capsule Take 7.5 mg by mouth at bedtime as needed for sleep.    [provider]  traMADol (ULTRAM) 50 MG tablet Take 1 tablet (50 mg total) every 6 (six) hours as needed by mouth. 02/15/17   Recardo Evangelist, PA-C  traZODone (DESYREL) 50 MG tablet Take 50 mg by mouth at bedtime.    [provider]  triamcinolone cream (KENALOG) 0.5 % Apply 1 application topically 2 (two) times daily as needed (for skin irritation).     [provider]    Allergies    Celebrex [celecoxib], Ciprofloxacin, Cymbalta [duloxetine hcl], Dilaudid [hydromorphone hcl], Diuretic [buchu-cornsil[place]-ch grass-hydran], Fentanyl and related, Glucophage [metformin hcl], Lyrica [pregabalin], Oxycodone, Morphine and related, Sulfa antibiotics, and Vicodin [hydrocodone-acetaminophen]  Review of Systems   Review of Systems  Constitutional:  Negative for fever.  Gastrointestinal:  Positive for abdominal pain, diarrhea and vomiting.  All other systems reviewed and are negative.  Physical Exam Updated Vital Signs BP 118/77 (BP Location: Left Arm)   Pulse 68   Temp 98.4 F (36.9 C) (Oral)   Resp 14   Ht 5\' 4"  (1.626 m)   Wt 90.7 kg   SpO2 100%   BMI 34.33 kg/m   Physical Exam Vitals and nursing note reviewed.  Constitutional:       Appearance: She is well-developed. She is not ill-appearing or diaphoretic.  HENT:     Head: Normocephalic and atraumatic.     Right Ear: External ear normal.     Left Ear: External ear normal.     Nose: Nose normal.  Eyes:     General:        Right eye: No discharge.        Left eye: No discharge.  Cardiovascular:     Rate and Rhythm: Normal rate and regular rhythm.     Heart sounds: Normal heart sounds.  Pulmonary:     Effort: Pulmonary effort is normal.     Breath sounds: Normal breath sounds.  Abdominal:     Palpations: Abdomen is soft.     Tenderness: There is generalized abdominal tenderness.  Skin:    General: Skin is warm and dry.  Neurological:     Mental Status: She is alert.  Psychiatric:        Mood and Affect: Mood is not anxious.    ED Results / Procedures / Treatments   Labs (all labs ordered are listed, but only abnormal results are displayed) Labs Reviewed  CBC WITH DIFFERENTIAL/PLATELET - Abnormal; Notable for the following components:      Result Value   WBC 11.1 (*)    Platelets 507 (*)    Neutro Abs 7.9 (*)    Monocytes Absolute 1.4 (*)    Abs Immature Granulocytes 0.23 (*)    All other components within normal limits  COMPREHENSIVE METABOLIC PANEL - Abnormal; Notable for the following components:   Glucose, Bld 130 (*)    BUN 34 (*)    Creatinine, Ser 4.19 (*)    GFR, Estimated 11 (*)    All other components within normal limits  RESP PANEL BY RT-PCR (FLU A&B, COVID) ARPGX2  LIPASE, BLOOD  URINALYSIS, ROUTINE W REFLEX MICROSCOPIC    EKG None  Radiology CT ABDOMEN PELVIS WO CONTRAST  Result Date: 02/06/2021 CLINICAL DATA:  Nausea, diarrhea and vomiting. Diverticulitis  suspected. History of interstitial cystitis and status post cystectomy with urinary diversion. EXAM: CT ABDOMEN AND PELVIS WITHOUT CONTRAST TECHNIQUE: Multidetector CT imaging of the abdomen and pelvis was performed following the standard protocol without IV contrast.  COMPARISON:  06/24/2015 and 12/28/2014 FINDINGS: Lower chest: Chronic volume loss or scarring in the medial right lung base. Otherwise, lung bases are clear. No pleural effusions. Hepatobiliary: Cholecystectomy.  Normal appearance of liver. Pancreas: Unremarkable. No pancreatic ductal dilatation or surrounding inflammatory changes. Spleen: Normal in size without focal abnormality. Adrenals/Urinary Tract: Normal appearance of the adrenal glands. Kidneys are slightly small for size. No hydronephrosis or ureter stones. Status post cystectomy with urinary diversion or ostomy in the right lower quadrant. Ileal conduit is not distended but there is a large stone within the conduit that measures 1.5 cm and this is new. No dilatation of the ureters. Stomach/Bowel: Stomach is distended with air-fluid level. Dilated loops of proximal small bowel containing fluid. Transition point appears to be related to a supraumbilical ventral hernia. The small bowel distal to the hernia is decompressed. Evidence for an ileocolic anastomosis in the right abdomen. No colonic dilatation. Multiple colonic diverticula particularly in the sigmoid colon. No colonic inflammation. Vascular/Lymphatic: Atherosclerotic calcifications in the abdominal aorta and iliac arteries without aneurysm. No significant lymph node enlargement in the abdomen or pelvis. Reproductive: Status post hysterectomy. No adnexal masses. Other: Negative for free fluid. Negative for free air. Small left inguinal hernia containing fat. Musculoskeletal: Degenerative facet arthropathy in the lower lumbar spine. Chronic areas of sclerosis in the lower thoracic spine. Chronic skin and superficial soft tissue thickening in the left anterior abdomen. IMPRESSION: 1. Small bowel obstruction secondary to an incarcerated ventral hernia. Distended stomach and small bowel loops with a transition point associated with a ventral hernia. No evidence for free fluid. 2. Postsurgical changes  associated with a cystectomy and ileal conduit. Negative for hydronephrosis. However, there is a large stone within the iliac conduit measuring up to 1.5 cm. 3.  Aortic Atherosclerosis (ICD10-I70.0). 4. Colonic diverticulosis but no evidence for acute diverticulitis. These results were called by telephone at the time of interpretation on 02/06/2021 at 3:04 pm to provider Sherwood Gambler , who verbally acknowledged these results. Electronically Signed   By: Markus Daft M.D.   On: 02/06/2021 15:05    Procedures .Critical Care Performed by: Sherwood Gambler, MD Authorized by: Sherwood Gambler, MD   Critical care provider statement:    Critical care time (minutes):  35   Critical care time was exclusive of:  Separately billable procedures and treating other patients   Critical care was necessary to treat or prevent imminent or life-threatening deterioration of the following conditions:  Renal failure and circulatory failure   Critical care was time spent personally by me on the following activities:  Development of treatment plan with patient or surrogate, discussions with consultants, evaluation of patient's response to treatment, examination of patient, obtaining history from patient or surrogate, ordering and performing treatments and interventions, ordering and review of laboratory studies, ordering and review of radiographic studies, pulse oximetry and re-evaluation of patient's condition   Medications Ordered in ED Medications  sodium chloride 0.9 % bolus 1,000 mL (0 mLs Intravenous Stopped 02/06/21 1317)  HYDROmorphone (DILAUDID) injection 1 mg (1 mg Intravenous Given 02/06/21 1207)  ondansetron (ZOFRAN) injection 4 mg (4 mg Intravenous Given 02/06/21 1207)  diphenhydrAMINE (BENADRYL) injection 25 mg (25 mg Intravenous Given 02/06/21 1207)  lactated ringers bolus 1,000 mL (0 mLs Intravenous Stopped 02/06/21 1545)  ED Course  I have reviewed the triage vital signs and the nursing  notes.  Pertinent labs & imaging results that were available during my care of the patient were reviewed by me and considered in my medical decision making (see chart for details).    MDM Rules/Calculators/A&P                           Patient CT scan shows incarcerated ventral hernia.  This was not apparent on exam, likely due to obesity.  I discussed with Dr. Ninfa Linden, who advises to try and reduce it but if I cannot get it reduced then to send her to the [facility] long ED for surgery to consult.  I went back and was able to find what I think was the hernia and after some gentle pressure it felt like it popped back in.  However she reports no improvement in her symptoms and is still complaining of a lot of pain in this area.  Given there was no clear visual response to hernia manipulation, I think is difficult to tell if is truly fully reduced and given that is causing a bowel obstruction and this is an urgent surgical problem I will send her to the [facility] long ED.  Discussed with Dr. Maryan Rued.  Even if it is reduced she will need admission for acute kidney injury. Final Clinical Impression(s) / ED Diagnoses Final diagnoses:  Incarcerated ventral hernia  Acute kidney injury [facility])    Rx / DC Orders ED Discharge Orders     None        Sherwood Gambler, MD 02/06/21 1546

## 2021-02-06 NOTE — ED Triage Notes (Signed)
Pt arrives to ED with c/o of suprapubic abdominal pain x5 days (10/26). Associated symptoms include diarrhea and vomiting that started when the pain started. She reports diarrhea and vomiting x6 times per day. She reports mild vaginal bleeding/ hematuria that occurred when the pain occurred. She has hx of urinary diversion procedure and hysterectomy.

## 2021-02-06 NOTE — Anesthesia Postprocedure Evaluation (Signed)
Anesthesia Post Note  Patient: Laurie [name]  Procedure(s) Performed: EXPLORATORY LAPAROTOMY; LYSIS OF ADHESIONS; INCISIONAL HERNIA REPAIR,SMALL BOWEL RESECTION     Patient location during evaluation: PACU Anesthesia Type: General Level of consciousness: awake and alert, oriented and patient cooperative Pain management: pain level controlled Vital Signs Assessment: post-procedure vital signs reviewed and stable Respiratory status: spontaneous breathing, nonlabored ventilation and respiratory function stable Cardiovascular status: blood pressure returned to baseline and stable Postop Assessment: no apparent nausea or vomiting Anesthetic complications: no   No notable events documented.  Last Vitals:  Vitals:   02/06/21 2312 02/06/21 2315  BP: (!) 146/69 (!) 144/75  Pulse: 73 73  Resp: 14 18  Temp: 37.1 C   SpO2: 90% 91%    Last Pain:  Vitals:   02/06/21 1648  TempSrc: Oral  PainSc:                  Lannie Fields

## 2021-02-06 NOTE — Anesthesia Procedure Notes (Deleted)
Procedure Name: Intubation Date/Time: 02/06/2021 8:17 PM Performed by: Cleda Daub, CRNA Pre-anesthesia Checklist: Patient identified, Emergency Drugs available, Suction available and Patient being monitored Patient Re-evaluated:Patient Re-evaluated prior to induction Oxygen Delivery Method: Circle system utilized Preoxygenation: Pre-oxygenation with 100% oxygen Induction Type: IV induction Laryngoscope Size: Mac and 3 Grade View: Grade II Tube type: Oral Number of attempts: 1 Airway Equipment and Method: Stylet and Oral airway Placement Confirmation: ETT inserted through vocal cords under direct vision, positive ETCO2 and breath sounds checked- equal and bilateral Secured at: 22 cm Tube secured with: Tape Dental Injury: Teeth and Oropharynx as per pre-operative assessment

## 2021-02-07 ENCOUNTER — Inpatient Hospital Stay (HOSPITAL_COMMUNITY): Payer: Medicare PPO

## 2021-02-07 ENCOUNTER — Encounter (HOSPITAL_COMMUNITY): Payer: Self-pay | Admitting: General Surgery

## 2021-02-07 DIAGNOSIS — R188 Other ascites: Secondary | ICD-10-CM | POA: Diagnosis present

## 2021-02-07 DIAGNOSIS — I700 Atherosclerosis of aorta: Secondary | ICD-10-CM | POA: Diagnosis present

## 2021-02-07 DIAGNOSIS — R4182 Altered mental status, unspecified: Secondary | ICD-10-CM | POA: Diagnosis not present

## 2021-02-07 DIAGNOSIS — Z20822 Contact with and (suspected) exposure to covid-19: Secondary | ICD-10-CM | POA: Diagnosis present

## 2021-02-07 DIAGNOSIS — E119 Type 2 diabetes mellitus without complications: Secondary | ICD-10-CM | POA: Diagnosis not present

## 2021-02-07 DIAGNOSIS — I2510 Atherosclerotic heart disease of native coronary artery without angina pectoris: Secondary | ICD-10-CM | POA: Diagnosis not present

## 2021-02-07 DIAGNOSIS — I10 Essential (primary) hypertension: Secondary | ICD-10-CM | POA: Diagnosis not present

## 2021-02-07 DIAGNOSIS — R1011 Right upper quadrant pain: Secondary | ICD-10-CM | POA: Diagnosis not present

## 2021-02-07 DIAGNOSIS — F05 Delirium due to known physiological condition: Secondary | ICD-10-CM | POA: Diagnosis not present

## 2021-02-07 DIAGNOSIS — E1122 Type 2 diabetes mellitus with diabetic chronic kidney disease: Secondary | ICD-10-CM | POA: Diagnosis present

## 2021-02-07 DIAGNOSIS — K3184 Gastroparesis: Secondary | ICD-10-CM | POA: Diagnosis present

## 2021-02-07 DIAGNOSIS — E870 Hyperosmolality and hypernatremia: Secondary | ICD-10-CM | POA: Diagnosis not present

## 2021-02-07 DIAGNOSIS — K651 Peritoneal abscess: Secondary | ICD-10-CM | POA: Diagnosis not present

## 2021-02-07 DIAGNOSIS — Z6834 Body mass index (BMI) 34.0-34.9, adult: Secondary | ICD-10-CM | POA: Diagnosis not present

## 2021-02-07 DIAGNOSIS — B9620 Unspecified Escherichia coli [E. coli] as the cause of diseases classified elsewhere: Secondary | ICD-10-CM | POA: Diagnosis not present

## 2021-02-07 DIAGNOSIS — E785 Hyperlipidemia, unspecified: Secondary | ICD-10-CM | POA: Diagnosis present

## 2021-02-07 DIAGNOSIS — N179 Acute kidney failure, unspecified: Secondary | ICD-10-CM | POA: Diagnosis present

## 2021-02-07 DIAGNOSIS — G9341 Metabolic encephalopathy: Secondary | ICD-10-CM | POA: Diagnosis not present

## 2021-02-07 DIAGNOSIS — I129 Hypertensive chronic kidney disease with stage 1 through stage 4 chronic kidney disease, or unspecified chronic kidney disease: Secondary | ICD-10-CM | POA: Diagnosis present

## 2021-02-07 DIAGNOSIS — E669 Obesity, unspecified: Secondary | ICD-10-CM | POA: Diagnosis present

## 2021-02-07 DIAGNOSIS — K436 Other and unspecified ventral hernia with obstruction, without gangrene: Secondary | ICD-10-CM | POA: Diagnosis present

## 2021-02-07 DIAGNOSIS — E1143 Type 2 diabetes mellitus with diabetic autonomic (poly)neuropathy: Secondary | ICD-10-CM | POA: Diagnosis present

## 2021-02-07 DIAGNOSIS — I63531 Cerebral infarction due to unspecified occlusion or stenosis of right posterior cerebral artery: Secondary | ICD-10-CM | POA: Diagnosis not present

## 2021-02-07 DIAGNOSIS — D509 Iron deficiency anemia, unspecified: Secondary | ICD-10-CM | POA: Diagnosis present

## 2021-02-07 DIAGNOSIS — F32A Depression, unspecified: Secondary | ICD-10-CM | POA: Diagnosis present

## 2021-02-07 DIAGNOSIS — I639 Cerebral infarction, unspecified: Secondary | ICD-10-CM | POA: Diagnosis not present

## 2021-02-07 DIAGNOSIS — J449 Chronic obstructive pulmonary disease, unspecified: Secondary | ICD-10-CM | POA: Diagnosis present

## 2021-02-07 DIAGNOSIS — E872 Acidosis, unspecified: Secondary | ICD-10-CM | POA: Diagnosis not present

## 2021-02-07 DIAGNOSIS — E440 Moderate protein-calorie malnutrition: Secondary | ICD-10-CM | POA: Diagnosis not present

## 2021-02-07 DIAGNOSIS — I6389 Other cerebral infarction: Secondary | ICD-10-CM | POA: Diagnosis not present

## 2021-02-07 DIAGNOSIS — T8143XA Infection following a procedure, organ and space surgical site, initial encounter: Secondary | ICD-10-CM | POA: Diagnosis not present

## 2021-02-07 LAB — COMPREHENSIVE METABOLIC PANEL
ALT: 18 U/L (ref 0–44)
AST: 25 U/L (ref 15–41)
Albumin: 3.3 g/dL — ABNORMAL LOW (ref 3.5–5.0)
Alkaline Phosphatase: 55 U/L (ref 38–126)
Anion gap: 12 (ref 5–15)
BUN: 43 mg/dL — ABNORMAL HIGH (ref 8–23)
CO2: 20 mmol/L — ABNORMAL LOW (ref 22–32)
Calcium: 8 mg/dL — ABNORMAL LOW (ref 8.9–10.3)
Chloride: 106 mmol/L (ref 98–111)
Creatinine, Ser: 4.81 mg/dL — ABNORMAL HIGH (ref 0.44–1.00)
GFR, Estimated: 9 mL/min — ABNORMAL LOW (ref >60)
Glucose, Bld: 189 mg/dL — ABNORMAL HIGH (ref 70–99)
Potassium: 4.6 mmol/L (ref 3.5–5.1)
Sodium: 138 mmol/L (ref 135–145)
Total Bilirubin: 0.7 mg/dL (ref 0.3–1.2)
Total Protein: 6.7 g/dL (ref 6.5–8.1)

## 2021-02-07 LAB — GLUCOSE, CAPILLARY
Glucose-Capillary: 150 mg/dL — ABNORMAL HIGH (ref 70–99)
Glucose-Capillary: 167 mg/dL — ABNORMAL HIGH (ref 70–99)
Glucose-Capillary: 155 mg/dL — ABNORMAL HIGH (ref 70–99)
Glucose-Capillary: 150 mg/dL — ABNORMAL HIGH (ref 70–99)
Glucose-Capillary: 129 mg/dL — ABNORMAL HIGH (ref 70–99)
Glucose-Capillary: 135 mg/dL — ABNORMAL HIGH (ref 70–99)

## 2021-02-07 LAB — CBC
HCT: 35.8 % — ABNORMAL LOW (ref 36.0–46.0)
Hemoglobin: 11.7 g/dL — ABNORMAL LOW (ref 12.0–15.0)
MCH: 32.3 pg (ref 26.0–34.0)
MCHC: 32.7 g/dL (ref 30.0–36.0)
MCV: 98.9 fL (ref 80.0–100.0)
Platelets: 396 10*3/uL (ref 150–400)
RBC: 3.62 MIL/uL — ABNORMAL LOW (ref 3.87–5.11)
RDW: 13.6 % (ref 11.5–15.5)
WBC: 20.4 10*3/uL — ABNORMAL HIGH (ref 4.0–10.5)
nRBC: 0 % (ref 0.0–0.2)

## 2021-02-07 LAB — HIV ANTIBODY (ROUTINE TESTING W REFLEX): HIV Screen 4th Generation wRfx: NONREACTIVE

## 2021-02-07 LAB — HEMOGLOBIN A1C
Hgb A1c MFr Bld: 6.2 % — ABNORMAL HIGH (ref 4.8–5.6)
Mean Plasma Glucose: 131.24 mg/dL

## 2021-02-07 MED ORDER — ALBUTEROL SULFATE (2.5 MG/3ML) 0.083% IN NEBU: 2.5000 mg | INHALATION_SOLUTION | RESPIRATORY_TRACT | Status: DC | PRN

## 2021-02-07 MED ORDER — DIPHENHYDRAMINE HCL 50 MG/ML IJ SOLN
INTRAMUSCULAR | Status: AC
  Filled 2021-02-07: qty 1

## 2021-02-07 MED ORDER — HEPARIN SODIUM (PORCINE) 5000 UNIT/ML IJ SOLN
5000.0000 [IU] | Freq: Three times a day (TID) | INTRAMUSCULAR | Status: DC
  Administered 2021-02-07 – 2021-02-10 (×10): 5000 [IU] via SUBCUTANEOUS
  Filled 2021-02-07 (×10): qty 1

## 2021-02-07 MED ORDER — PHENOL 1.4 % MT LIQD: 1.0000 | OROMUCOSAL | Status: DC | PRN

## 2021-02-07 MED ORDER — PANTOPRAZOLE SODIUM 40 MG IV SOLR
40.0000 mg | INTRAVENOUS | Status: DC
  Administered 2021-02-07 – 2021-02-13 (×7): 40 mg via INTRAVENOUS
  Filled 2021-02-07 (×7): qty 40

## 2021-02-07 MED ORDER — HEPARIN SODIUM (PORCINE) 5000 UNIT/ML IJ SOLN: 5000.0000 [IU] | Freq: Three times a day (TID) | INTRAMUSCULAR | Status: DC

## 2021-02-07 MED ORDER — HYDROMORPHONE HCL 1 MG/ML IJ SOLN
INTRAMUSCULAR | Status: AC
  Filled 2021-02-07: qty 1

## 2021-02-07 MED ORDER — HEPARIN SODIUM (PORCINE) 5000 UNIT/ML IJ SOLN
INTRAMUSCULAR | Status: AC
  Administered 2021-02-07: 5000 [IU] via SUBCUTANEOUS
  Filled 2021-02-07: qty 1

## 2021-02-07 MED ORDER — MENTHOL 3 MG MT LOZG
1.0000 | LOZENGE | OROMUCOSAL | Status: DC | PRN
  Filled 2021-02-07 (×2): qty 9

## 2021-02-07 MED ORDER — HYDROMORPHONE HCL 1 MG/ML IJ SOLN
0.5000 mg | INTRAMUSCULAR | Status: DC | PRN
  Administered 2021-02-07: 0.5 mg via INTRAVENOUS
  Administered 2021-02-08 – 2021-02-12 (×17): 1 mg via INTRAVENOUS
  Administered 2021-02-13: 0.5 mg via INTRAVENOUS
  Administered 2021-02-13 – 2021-02-14 (×3): 1 mg via INTRAVENOUS
  Administered 2021-02-14: 0.5 mg via INTRAVENOUS
  Filled 2021-02-07 (×24): qty 1

## 2021-02-07 NOTE — Progress Notes (Signed)
Late entry: 12 Lead EKG ordered @ ER. Not released order, could not find EKG result. 12 Lead EKG done at after midnight. Normal sinus rhythem, notified [facility] hospitalist. New orders.

## 2021-02-07 NOTE — Consult Note (Signed)
Reason for Consult: Urinary Pouch Stone / Urinary Diversion, Small Bowel Obstruction, Acute on Chronic Renal Failure  Referring Physician: Vance Gather MD  Laurie [name] Kashlee Nevils is an 65 y.o. female.   HPI:   1 - Urinary Pouch Stone / Urinary Diversion - follows Dr. Terance Hart with [facility] for h/o cystectomy and Rt Colon pounch Laurie [name]) for intersitial cystitis in 1990s.  CT 02/06/21 with about 1.5cm pouch stone, non-obstructing. Cathes per stoma 4-5x per day and very facile.   2 - Small Bowel Obstruction - s/p ex-lap, adhesiolysis, small bowel resection 02/06/21 by [facility] and [facility] Done for high grade bowel obstruction. 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  3 - Acute on Chronic Renal Failure - baseline Cr 2's with rise to 4's during admission for bowel obstruction. NO hydronephrosis or pouch dilation.   PMH sig for large obesity, DM2. Her daughter Laurie [name] is involved.   Today "Laurie [name]" is seen in consultation for above, specifically to rule out obstructive cuases to her renal failure or need for acute intervention for pouch stone.   Past Medical History:  Diagnosis Date   Anal fissure    Asthma    B12 deficiency    Candidal esophagitis (HCC)    Carpal tunnel syndrome    Chronic abdominal pain    Chronic UTI    CKD (chronic kidney disease)    Coronary artery disease    Diabetes mellitus without complication (HCC)    Diabetic gastropathy (HCC)    Episodic low back pain    GERD (gastroesophageal reflux disease)    GI bleed    Glaucoma    Hepatitis C    Hypertension    IBS (irritable bowel syndrome)    Interstitial cystitis    Iron deficiency anemia    Myofascial pain syndrome    Obesity (BMI 35.0-39.9 without comorbidity)    Plantar fasciitis     Past Surgical History:  Procedure Laterality Date   ABDOMINAL HYSTERECTOMY     BILATERAL CARPAL TUNNEL RELEASE     BREAST SURGERY     CHOLECYSTECTOMY     COLONOSCOPY     ESOPHAGOGASTRODUODENOSCOPY     ESOPHAGOGASTRODUODENOSCOPY (EGD)  WITH PROPOFOL N/A 08/22/2016   Procedure: ESOPHAGOGASTRODUODENOSCOPY (EGD) WITH PROPOFOL;  Surgeon: Milus Banister, MD;  Location: [facility];  Service: Endoscopy;  Laterality: N/A;   ESOPHAGOGASTRODUODENOSCOPY (EGD) WITH PROPOFOL N/A 02/11/2017   Procedure: ESOPHAGOGASTRODUODENOSCOPY (EGD) WITH PROPOFOL;  Surgeon: Jerene Bears, MD;  Location: [facility];  Service: Gastroenterology;  Laterality: N/A;   INCISION AND DRAINAGE ABSCESS Left 06/25/2015   Procedure: INCISION AND DRAINAGE ABSCESS;  Surgeon: Leighton Ruff, MD;  Location: [facility];  Service: General;  Laterality: Left;   REVISION UROSTOMY CUTANEOUS      Family History  Problem Relation Age of Onset   CAD Mother    Cancer Father    Stroke Brother     Social History:  reports that she has quit smoking. She has never used smokeless tobacco. She reports that she does not drink alcohol and does not use drugs.  Allergies:  Allergies  Allergen Reactions   Buchu-Cornsilk-Ch Grass-Hydran Other (See Comments)    Dehydration. Elevated creatini Other reaction(s): Other (See Comments) Dehydration, increase in creatinine   Celebrex [Celecoxib] Itching   Ciprofloxacin Other (See Comments)    No history of rash or SOB. Had kidney problems when she took cipro but was not told she had interstitial nephritis. Other reaction(s): Other (See Comments), Unknown Kidney problems   Cymbalta [Duloxetine Hcl]  GI upset / constipation.    Dilaudid [Hydromorphone Hcl] Itching   Fentanyl And Related Itching   Glucophage [Metformin Hcl] Other (See Comments)    Stopped due to increase creat   Oxycodone Itching   Pregabalin Other (See Comments)    CNS disorder. Headache Other reaction(s): Other (See Comments) Headache, CNS disorder   Hydrocodone-Acetaminophen Rash   Morphine And Related Rash and Itching   Sulfa Antibiotics Rash   Vicodin [Hydrocodone-Acetaminophen] Rash    Medications: I have reviewed the patient's current  medications.  Results for orders placed or performed during the hospital encounter of 02/06/21 (from the past 48 hour(s))  CBC with Differential     Status: Abnormal   Collection Time: 02/06/21 11:55 AM  Result Value Ref Range   WBC 11.1 (H) 4.0 - 10.5 K/uL   RBC 4.33 3.87 - 5.11 MIL/uL   Hemoglobin 13.8 12.0 - 15.0 g/dL   HCT 95.0 93.2 - 67.1 %   MCV 94.5 80.0 - 100.0 fL   MCH 31.9 26.0 - 34.0 pg   MCHC 33.7 30.0 - 36.0 g/dL   RDW 24.5 80.9 - 98.3 %   Platelets 507 (H) 150 - 400 K/uL   nRBC 0.0 0.0 - 0.2 %   Neutrophils Relative % 71 %   Neutro Abs 7.9 (H) 1.7 - 7.7 K/uL   Lymphocytes Relative 13 %   Lymphs Abs 1.5 0.7 - 4.0 K/uL   Monocytes Relative 13 %   Monocytes Absolute 1.4 (H) 0.1 - 1.0 K/uL   Eosinophils Relative 1 %   Eosinophils Absolute 0.1 0.0 - 0.5 K/uL   Basophils Relative 0 %   Basophils Absolute 0.0 0.0 - 0.1 K/uL   Immature Granulocytes 2 %   Abs Immature Granulocytes 0.23 (H) 0.00 - 0.07 K/uL    Comment: Performed at [facility], [address] [place], [place] [address]  Resp Panel by RT-PCR (Flu A&B, Covid) Nasopharyngeal Swab     Status: None   Collection Time: 02/06/21 11:55 AM   Specimen: Nasopharyngeal Swab; Nasopharyngeal(NP) swabs in vial transport medium  Result Value Ref Range   SARS Coronavirus 2 by RT PCR NEGATIVE NEGATIVE    Comment: (NOTE) SARS-CoV-2 target nucleic acids are NOT DETECTED.  The SARS-CoV-2 RNA is generally detectable in upper respiratory specimens during the acute phase of infection. The lowest concentration of SARS-CoV-2 viral copies this assay can detect is 138 copies/mL. A negative result does not preclude SARS-Cov-2 infection and should not be used as the sole basis for treatment or other patient management decisions. A negative result may occur with  improper specimen collection/handling, submission of specimen other than nasopharyngeal swab, presence of viral mutation(s) within the areas targeted  by this assay, and inadequate number of viral copies(<138 copies/mL). A negative result must be combined with clinical observations, patient history, and epidemiological information. The expected result is Negative.  Fact Sheet for Patients:  BloggerCourse.com  Fact Sheet for Healthcare Providers:  SeriousBroker.it  This test is no t yet approved or cleared by the [place] FDA and  has been authorized for detection and/or diagnosis of SARS-CoV-2 by FDA under an Emergency Use Authorization (EUA). This EUA will remain  in effect (meaning this test can be used) for the duration of the COVID-19 declaration under Section 564(b)(1) of the Act, 21 U.S.C.section 360bbb-3(b)(1), unless the authorization is terminated  or revoked sooner.       Influenza A by PCR NEGATIVE NEGATIVE   Influenza B by PCR  NEGATIVE NEGATIVE    Comment: (NOTE) The Xpert Xpress SARS-CoV-2/FLU/RSV plus assay is intended as an aid in the diagnosis of influenza from Nasopharyngeal swab specimens and should not be used as a sole basis for treatment. Nasal washings and aspirates are unacceptable for Xpert Xpress SARS-CoV-2/FLU/RSV testing.  Fact Sheet for Patients: EntrepreneurPulse.com.au  Fact Sheet for Healthcare Providers: IncredibleEmployment.be  This test is not yet approved or cleared by the [place] FDA and has been authorized for detection and/or diagnosis of SARS-CoV-2 by FDA under an Emergency Use Authorization (EUA). This EUA will remain in effect (meaning this test can be used) for the duration of the COVID-19 declaration under Section 564(b)(1) of the Act, 21 U.S.C. section 360bbb-3(b)(1), unless the authorization is terminated or revoked.  Performed at [facility], [address] [place], [place] [address]   Comprehensive metabolic panel     Status: Abnormal   Collection Time:  02/06/21  1:15 PM  Result Value Ref Range   Sodium 137 135 - 145 mmol/L   Potassium 5.1 3.5 - 5.1 mmol/L   Chloride 102 98 - 111 mmol/L   CO2 26 22 - 32 mmol/L   Glucose, Bld 130 (H) 70 - 99 mg/dL    Comment: Glucose reference range applies only to samples taken after fasting for at least 8 hours.   BUN 34 (H) 8 - 23 mg/dL   Creatinine, Ser 4.19 (H) 0.44 - 1.00 mg/dL   Calcium 9.4 8.9 - 10.3 mg/dL   Total Protein 8.0 6.5 - 8.1 g/dL   Albumin 4.1 3.5 - 5.0 g/dL   AST 22 15 - 41 U/L   ALT 18 0 - 44 U/L   Alkaline Phosphatase 58 38 - 126 U/L   Total Bilirubin 0.4 0.3 - 1.2 mg/dL   GFR, Estimated 11 (L) >60 mL/min    Comment: (NOTE) Calculated using the CKD-EPI Creatinine Equation (2021)    Anion gap 9 5 - 15    Comment: Performed at [facility], [place], [place] [address]  Lipase, blood     Status: None   Collection Time: 02/06/21  1:15 PM  Result Value Ref Range   Lipase 34 11 - 51 U/L    Comment: Performed at [facility], [address] [place], [place] [address]  Glucose, capillary     Status: Abnormal   Collection Time: 02/06/21 11:15 PM  Result Value Ref Range   Glucose-Capillary 133 (H) 70 - 99 mg/dL    Comment: Glucose reference range applies only to samples taken after fasting for at least 8 hours.  Glucose, capillary     Status: Abnormal   Collection Time: 02/07/21  3:26 AM  Result Value Ref Range   Glucose-Capillary 150 (H) 70 - 99 mg/dL    Comment: Glucose reference range applies only to samples taken after fasting for at least 8 hours.  Hemoglobin A1c     Status: Abnormal   Collection Time: 02/07/21  5:46 AM  Result Value Ref Range   Hgb A1c MFr Bld 6.2 (H) 4.8 - 5.6 %    Comment: (NOTE) Pre diabetes:          5.7%-6.4%  Diabetes:              >6.4%  Glycemic control for   <7.0% adults with diabetes    Mean Plasma Glucose 131.24 mg/dL    Comment: Performed at [facility] [address] [place], [place] [address]  CBC     Status: Abnormal   Collection Time: 02/07/21  5:46 AM  Result Value Ref Range   WBC 20.4 (H) 4.0 - 10.5 K/uL   RBC 3.62 (L) 3.87 - 5.11 MIL/uL   Hemoglobin 11.7 (L) 12.0 - 15.0 g/dL   HCT 35.8 (L) 36.0 - 46.0 %   MCV 98.9 80.0 - 100.0 fL   MCH 32.3 26.0 - 34.0 pg   MCHC 32.7 30.0 - 36.0 g/dL   RDW 13.6 11.5 - 15.5 %   Platelets 396 150 - 400 K/uL   nRBC 0.0 0.0 - 0.2 %    Comment: Performed at [facility], [place] [address], [place], [place] [address]  Comprehensive metabolic panel     Status: Abnormal   Collection Time: 02/07/21  5:46 AM  Result Value Ref Range   Sodium 138 135 - 145 mmol/L   Potassium 4.6 3.5 - 5.1 mmol/L   Chloride 106 98 - 111 mmol/L   CO2 20 (L) 22 - 32 mmol/L   Glucose, Bld 189 (H) 70 - 99 mg/dL    Comment: Glucose reference range applies only to samples taken after fasting for at least 8 hours.   BUN 43 (H) 8 - 23 mg/dL   Creatinine, Ser 4.81 (H) 0.44 - 1.00 mg/dL   Calcium 8.0 (L) 8.9 - 10.3 mg/dL   Total Protein 6.7 6.5 - 8.1 g/dL   Albumin 3.3 (L) 3.5 - 5.0 g/dL   AST 25 15 - 41 U/L   ALT 18 0 - 44 U/L   Alkaline Phosphatase 55 38 - 126 U/L   Total Bilirubin 0.7 0.3 - 1.2 mg/dL   GFR, Estimated 9 (L) >60 mL/min    Comment: (NOTE) Calculated using the CKD-EPI Creatinine Equation (2021)    Anion gap 12 5 - 15    Comment: Performed at [facility], [place] [address], [place], [place] [address]  Glucose, capillary     Status: Abnormal   Collection Time: 02/07/21  8:53 AM  Result Value Ref Range   Glucose-Capillary 167 (H) 70 - 99 mg/dL    Comment: Glucose reference range applies only to samples taken after fasting for at least 8 hours.   Comment 1 Notify RN     CT ABDOMEN PELVIS WO CONTRAST  Result Date: 02/06/2021 CLINICAL DATA:  Abdominal pain, hernia suspected. Status post manual attempted reduction. Vomiting and diarrhea history of urinary diversion. EXAM: CT ABDOMEN AND  PELVIS WITHOUT CONTRAST TECHNIQUE: Multidetector CT imaging of the abdomen and pelvis was performed following the standard protocol without IV contrast. COMPARISON:  CT 2-1/2 hours ago. FINDINGS: Lower chest: Stable chronic right lung base scarring. No acute findings or change from earlier today. Hepatobiliary: No focal liver abnormality is seen. Status post cholecystectomy. No biliary dilatation. Pancreas: Mild parenchymal atrophy. No ductal dilatation or inflammation. Spleen: Normal in size without focal abnormality. Adrenals/Urinary Tract: No adrenal nodule. Stable mild left adrenal thickening. There is no hydronephrosis or renal calculi. Status post cystectomy with urinary diversion. No ureteral dilatation. Stable 15 mm stone in the ureteral confluence, series 2, image 78. The ostomy is nondilated. Stomach/Bowel: Small-bowel obstruction secondary to umbilical hernia without significant change in the interim. Prominently fluid-filled distended stomach. There is fluid distending the distal esophagus. Small bowel is dilated and fluid-filled to the level of umbilical hernia. The exiting small bowel is completely decompressed. The hernia neck spans 13 mm, measured on series 2, image 56. There is minimal mesenteric edema.  No bowel pneumatosis. Small bowel distal to the hernia is decompressed. Ileal colonic anastomosis in the right upper quadrant there is small volume of colonic stool. Distal descending and sigmoid colonic diverticulosis without diverticulitis. Vascular/Lymphatic: Aortic atherosclerosis. No aortic aneurysm. No portal venous or mesenteric gas. No bulky abdominopelvic adenopathy. Reproductive: Hysterectomy.  No adnexal mass. Other: No ascites or free air. Umbilical hernia containing a short segment of small bowel with associated small bowel obstruction. There is skin and soft tissue thickening of the left anterior abdominal wall, typical of medication injection site. Small fat containing inguinal  hernias. Musculoskeletal: Stable from earlier today. IMPRESSION: 1. Persistent and unchanged small-bowel obstruction secondary to umbilical hernia. No evidence of perforation. 2. Status post cystectomy with urinary diversion. Unchanged 15 mm stone in the ureteral confluence. 3. Colonic diverticulosis without diverticulitis. Aortic Atherosclerosis (ICD10-I70.0). Electronically Signed   By: Narda RutherfordMelanie  Sanford M.D.   On: 02/06/2021 18:01   CT ABDOMEN PELVIS WO CONTRAST  Result Date: 02/06/2021 CLINICAL DATA:  Nausea, diarrhea and vomiting. Diverticulitis suspected. History of interstitial cystitis and status post cystectomy with urinary diversion. EXAM: CT ABDOMEN AND PELVIS WITHOUT CONTRAST TECHNIQUE: Multidetector CT imaging of the abdomen and pelvis was performed following the standard protocol without IV contrast. COMPARISON:  06/24/2015 and 12/28/2014 FINDINGS: Lower chest: Chronic volume loss or scarring in the medial right lung base. Otherwise, lung bases are clear. No pleural effusions. Hepatobiliary: Cholecystectomy.  Normal appearance of liver. Pancreas: Unremarkable. No pancreatic ductal dilatation or surrounding inflammatory changes. Spleen: Normal in size without focal abnormality. Adrenals/Urinary Tract: Normal appearance of the adrenal glands. Kidneys are slightly small for size. No hydronephrosis or ureter stones. Status post cystectomy with urinary diversion or ostomy in the right lower quadrant. Ileal conduit is not distended but there is a large stone within the conduit that measures 1.5 cm and this is new. No dilatation of the ureters. Stomach/Bowel: Stomach is distended with air-fluid level. Dilated loops of proximal small bowel containing fluid. Transition point appears to be related to a supraumbilical ventral hernia. The small bowel distal to the hernia is decompressed. Evidence for an ileocolic anastomosis in the right abdomen. No colonic dilatation. Multiple colonic diverticula particularly  in the sigmoid colon. No colonic inflammation. Vascular/Lymphatic: Atherosclerotic calcifications in the abdominal aorta and iliac arteries without aneurysm. No significant lymph node enlargement in the abdomen or pelvis. Reproductive: Status post hysterectomy. No adnexal masses. Other: Negative for free fluid. Negative for free air. Small left inguinal hernia containing fat. Musculoskeletal: Degenerative facet arthropathy in the lower lumbar spine. Chronic areas of sclerosis in the lower thoracic spine. Chronic skin and superficial soft tissue thickening in the left anterior abdomen. IMPRESSION: 1. Small bowel obstruction secondary to an incarcerated ventral hernia. Distended stomach and small bowel loops with a transition point associated with a ventral hernia. No evidence for free fluid. 2. Postsurgical changes associated with a cystectomy and ileal conduit. Negative for hydronephrosis. However, there is a large stone within the iliac conduit measuring up to 1.5 cm. 3.  Aortic Atherosclerosis (ICD10-I70.0). 4. Colonic diverticulosis but no evidence for acute diverticulitis. These results were called by telephone at the time of interpretation on 02/06/2021 at 3:04 pm to provider Pricilla LovelessSCOTT GOLDSTON , who verbally acknowledged these results. Electronically Signed   By: Richarda OverlieAdam  Henn M.D.   On: 02/06/2021 15:05    Review of Systems  Constitutional:  Negative for chills.  Gastrointestinal:  Positive for abdominal distention.  All other systems reviewed and are negative. Blood pressure 123/61, pulse  80, temperature 100.1 F (37.8 C), temperature source Oral, resp. rate 16, height 5\' 4"  (1.626 m), weight 90.7 kg, SpO2 92 %. Physical Exam Vitals reviewed.  Constitutional:      Comments: Loquatious. Daughter at bedside.   HENT:     Head: Normocephalic.  Cardiovascular:     Rate and Rhythm: Normal rate.  Pulmonary:     Effort: Pulmonary effort is normal.  Abdominal:     Comments: Large truncal obesity with  midline wound. RLQ pouch stoma noted and pink / patent.   Genitourinary:    Comments: No CVAT Skin:    General: Skin is warm.  Neurological:     Mental Status: She is alert.    Assessment/Plan:  1 - Urinary Pouch Stone / Urinary Diversion - this stone is non-obstructing and not a part of acute bowel obstruction or renal failure, manamgnet in elective setting per [facility] who is familiar with her case.   2 - Small Bowel Obstruction - Appreciate gen surg intervention in this very high risk patient. She is at very high risk for leaks and further obstruction.   3 - Acute on Chronic Renal Failure - appears likely pre-renal majority. No hydro or pouch dilation to suggest obstructive component. No indication for additional GU drainage.   NO indication for additional GU procedures / tubes / drainage. Please call me directly with questions anytime.   02/07/2021, 12:20 PM

## 2021-02-07 NOTE — Evaluation (Addendum)
Physical Therapy Evaluation Patient Details Name: Laurie [name] MRN: [id number] DOB: 1955/09/14 Today's Date: 02/07/2021  History of Present Illness  Laurie [name] is a 65 y.o. female with a history of T2DM, HTN, HLD, anxiety/depression, GERD, obesity and multiple abdominopelvic surgeries who presented to the ED 02/06/21 with abdominal pain , CT revealed an incarcerated ventral hernia. S/P small bowell resection, hernia repair and lysis of adhesions 02/06/21.  Clinical Impression  Patient  restless in bed, generally sleepy but willing to  get to recliner. Cues for abdomen precautions, rolling to side, required mod assistance to sit upright, guarding.  Min assistance  Hand hold to stand  and take a few small steps to recliner. Daughter present  , who works and available to assist  when able. Sister may be available., will need further clarification for caregiver availability.  Pt admitted with above diagnosis.  Pt currently with functional limitations due to the deficits listed below (see PT Problem List). Pt will benefit from skilled PT to increase their independence and safety with mobility to allow discharge to the venue listed below.        Recommendations for follow up therapy are one component of a multi-disciplinary discharge planning process, led by the attending physician.  Recommendations may be updated based on patient status, additional functional criteria and insurance authorization.  Follow Up Recommendations Home health PT    Assistance Recommended at Discharge    Functional Status Assessment Patient has had a recent decline in their functional status and demonstrates the ability to make significant improvements in function in a reasonable and predictable amount of time.  Equipment Recommendations  None recommended by PT    Recommendations for Other Services       Precautions / Restrictions Precautions Precautions: Fall Precaution Comments: abdomen, NG suction,  ice chips only- wants to drink melted ice      Mobility  Bed Mobility Overal bed mobility: Needs Assistance Bed Mobility: Rolling;Sidelying to Sit Rolling: Mod assist Sidelying to sit: Mod assist       General bed mobility comments: cues for technique. assistance with trunk to sit upright    Transfers Overall transfer level: Needs assistance Equipment used: 1 person hand held assist Transfers: Sit to/from [employer] Sit to Stand: Min assist Stand pivot transfers: Min assist         General transfer comment: assist to rise from bed raised. small steps to recliner with HHA    Ambulation/Gait             General Gait Details: TBA  Stairs            Wheelchair Mobility    Modified Rankin (Stroke Patients Only)       Balance Overall balance assessment: Needs assistance Sitting-balance support: Single extremity supported Sitting balance-Leahy Scale: Good     Standing balance support: During functional activity;Single extremity supported Standing balance-Leahy Scale: Fair Standing balance comment: reliant on UE                             Pertinent Vitals/Pain Pain Assessment: Faces Faces Pain Scale: Hurts even more Pain Location: abdomen Pain Descriptors / Indicators: Discomfort;Grimacing;Guarding Pain Intervention(s): Monitored during session;Patient requesting pain meds-RN notified    Home Living Family/patient expects to be discharged to:: Private residence Living Arrangements: Children;Other relatives Available Help at Discharge: Family;Available 24 hours/day Type of Home: House Home Access: Stairs to enter   [employer] of Steps:  2   Home Layout: One level Home Equipment: Agricultural consultant (2 wheels)      Prior Function Prior Level of Function : Independent/Modified Independent                     Hand Dominance        Extremity/Trunk Assessment   Upper Extremity Assessment Upper  Extremity Assessment: Overall WFL for tasks assessed    Lower Extremity Assessment Lower Extremity Assessment: Overall WFL for tasks assessed    Cervical / Trunk Assessment Cervical / Trunk Assessment: Normal;Other exceptions Cervical / Trunk Exceptions: guarding abdomen when stands  Communication   Communication: No difficulties  Cognition Arousal/Alertness: Awake/alert Behavior During Therapy: WFL for tasks assessed/performed;Restless Overall Cognitive Status:  patient not fully clear about her home situation. Daughter provided that she is with patient( later stated that she goes to work at 6: so will nee clarification on caregivers.                                          General Comments      Exercises     Assessment/Plan    PT Assessment Patient needs continued PT services  PT Problem List Decreased mobility;Decreased activity tolerance;Decreased balance;Decreased knowledge of use of DME;Decreased knowledge of precautions;Pain       PT Treatment Interventions      PT Goals (Current goals can be found in the Care Plan section)  Acute Rehab PT Goals Patient Stated Goal: eat and drink, go home PT Goal Formulation: With patient/family Time For Goal Achievement: 02/21/21 Potential to Achieve Goals: Good    Frequency Min 3X/week   Barriers to discharge        Co-evaluation               AM-PAC PT "6 Clicks" Mobility  Outcome Measure Help needed turning from your back to your side while in a flat bed without using bedrails?: A Lot Help needed moving from lying on your back to sitting on the side of a flat bed without using bedrails?: A Lot Help needed moving to and from a bed to a chair (including a wheelchair)?: A Little Help needed standing up from a chair using your arms (e.g., wheelchair or bedside chair)?: A Little Help needed to walk in hospital room?: A Lot Help needed climbing 3-5 steps with a railing? : A Lot 6 Click Score: 14     End of Session   Activity Tolerance: Patient limited by fatigue;Patient limited by pain Patient left: in chair;with call bell/phone within reach;with chair alarm set;with family/visitor present Nurse Communication: Mobility status PT Visit Diagnosis: Unsteadiness on feet (R26.81);Pain    Time: 0354-6568 PT Time Calculation (min) (ACUTE ONLY): 19 min   Charges:   PT Evaluation $PT Eval Low Complexity: 1 Low          Blanchard Kelch PT Acute Rehabilitation Services Pager [phone] Office [phone]   Rada Hay 02/07/2021, 3:30 PM

## 2021-02-07 NOTE — Progress Notes (Addendum)
PROGRESS NOTE  Laurie [name]  EOF:[id number] DOB: [birth year] DOA: 02/06/2021 PCP: Margit Hanks, MD   Brief Narrative: Laurie [name] is a 65 y.o. female with a history of T2DM, HTN, HLD, anxiety/depression, GERD, obesity and multiple abdominopelvic surgeries who presented to the ED with 5 days of worsening lower abdominal pain radiating outward centering in the lower midline. It has been associated with loose stools and vomiting. No medications tried at home, pain progressively worsened, prompting ED visit where CT revealed an incarcerated ventral hernia. EDP attempted reduction though symptoms did not improve. Labs notable for acute renal failure in the setting of dehydration. She was transferred to [facility] ED for surgical consult, repeat CT abdomen showed persistent hernia with bowel obstruction for which she was taken to the OR.  Assessment & Plan: Active Problems:   CAD (coronary artery disease)   Essential hypertension   Acute renal failure (ARF) (HCC)   Diabetes mellitus without complication (HCC)   Incarcerated ventral hernia   SBO (small bowel obstruction) (HCC)   Obesity  Acute renal failure: Worsening postop. Baseline SCr ~1.6-1.9.  - Continue IVF, recheck metabolic panel. Making urine.  - Check UA for micro (not yet collected) - Urostomy stone 1.5cm noted on CT not previously seen, though no dilatation or hydroureteronephrosis. No thickening of walls or dilatation so doubt this is currently a consequential finding. Will discuss with urology.  Incarcerated ventral hernia with SBO s/p repair with small bowel resection, LOA (nearly frozen abdomen).  - Per surgery: Continue NGT to LIWS. NPO. IVF.   Leukocytosis: Certainly could be reactive to obstruction and worsened by surgery. It also appears she was given dexamethasone at some point. Pt is afebrile.  - Monitor without additional abx at this time.   T2DM:  - Hold metformin.  - Pt reportedly on 50u lantus qHS at  home. HbA1c 6.2%, though CBGs at inpatient goal on SSI alone.Will not start basal currently.  - Continue SSI q4h  HTN:  - Hold norvasc, imdur, lisinopril, metoprolol while NPO, can give prn metoprolol by IV  COPD:  - Continue dulera, prn albuterol  GERD:  - Change to IV PPI  Depression:  - Continue SSRI once taking po HLD:  - Restart statin once taking po.  Obesity: Estimated body mass index is 34.33 kg/m as calculated from the following:   Height as of this encounter: 5\' 4"  (1.626 m).   Weight as of this encounter: 90.7 kg.  DVT prophylaxis: Heparin Code Status: Full Family Communication: None at bedside Disposition Plan:  Status is: Observation  The patient will require care spanning > 2 midnights and should be moved to inpatient because: SBO  Consultants:  Surgery  Procedures:  Ventral hernia repair, small bowel resection, LOA Dr. 02/06/2021  Antimicrobials: Ancef perioperatively   Subjective: Pain in abdomen is stable, constant, wants to drink now. No BM today.   Objective: Vitals:   02/07/21 0700 02/07/21 0741 02/07/21 0745 02/07/21 0814  BP: (!) 117/58   (!) 113/55  Pulse: 84 79 78 81  Resp: 15 (!) 29 (!) 22 14  Temp:   98.6 F (37 C) 98.2 F (36.8 C)  TempSrc:      SpO2: 95% 95% 95% 97%  Weight:      Height:        Intake/Output Summary (Last 24 hours) at 02/07/2021 1204 Last data filed at 02/07/2021 0741 Gross per 24 hour  Intake 2678.81 ml  Output 410 ml  Net 2268.81 ml  Filed Weights   02/06/21 1053  Weight: 90.7 kg    Gen: 65 y.o. female in no distress Pulm: Non-labored breathing. Clear to auscultation bilaterally.  CV: Regular rate and rhythm. No murmur, rub, or gallop. No definite JVD, no pitting pedal edema. GI: Abdomen soft, obese with midline dressing c/d/I. Right urostomy appears pink without output currently. No erythema, induration.  Ext: Warm, no deformities Skin: As above, and many abdominal scars again  noted. Neuro: Alert and oriented. No focal neurological deficits. Psych: Judgement and insight appear normal. Mood & affect appropriate.   Data Reviewed: I have personally reviewed following labs and imaging studies  CBC: Recent Labs  Lab 02/06/21 1155 02/07/21 0546  WBC 11.1* 20.4*  NEUTROABS 7.9*  --   HGB 13.8 11.7*  HCT 40.9 35.8*  MCV 94.5 98.9  PLT 507* 396   Basic Metabolic Panel: Recent Labs  Lab 02/06/21 1315 02/07/21 0546  NA 137 138  K 5.1 4.6  CL 102 106  CO2 26 20*  GLUCOSE 130* 189*  BUN 34* 43*  CREATININE 4.19* 4.81*  CALCIUM 9.4 8.0*   GFR: Estimated Creatinine Clearance: 12.6 mL/min (A) (by C-G formula based on SCr of 4.81 mg/dL (H)). Liver Function Tests: Recent Labs  Lab 02/06/21 1315 02/07/21 0546  AST 22 25  ALT 18 18  ALKPHOS 58 55  BILITOT 0.4 0.7  PROT 8.0 6.7  ALBUMIN 4.1 3.3*   Recent Labs  Lab 02/06/21 1315  LIPASE 34   No results for input(s): AMMONIA in the last 168 hours. Coagulation Profile: No results for input(s): INR, PROTIME in the last 168 hours. Cardiac Enzymes: No results for input(s): CKTOTAL, CKMB, CKMBINDEX, TROPONINI in the last 168 hours. BNP (last 3 results) No results for input(s): PROBNP in the last 8760 hours. HbA1C: Recent Labs    02/07/21 0546  HGBA1C 6.2*   CBG: Recent Labs  Lab 02/06/21 2315 02/07/21 0326 02/07/21 0853  GLUCAP 133* 150* 167*   Lipid Profile: No results for input(s): CHOL, HDL, LDLCALC, TRIG, CHOLHDL, LDLDIRECT in the last 72 hours. Thyroid Function Tests: No results for input(s): TSH, T4TOTAL, FREET4, T3FREE, THYROIDAB in the last 72 hours. Anemia Panel: No results for input(s): VITAMINB12, FOLATE, FERRITIN, TIBC, IRON, RETICCTPCT in the last 72 hours. Urine analysis:    Component Value Date/Time   COLORURINE YELLOW 06/28/2015 0930   APPEARANCEUR CLEAR 06/28/2015 0930   LABSPEC 1.012 06/28/2015 0930   PHURINE 7.0 06/28/2015 0930   GLUCOSEU NEGATIVE 06/28/2015 0930    HGBUR NEGATIVE 06/28/2015 0930   BILIRUBINUR NEGATIVE 06/28/2015 0930   KETONESUR NEGATIVE 06/28/2015 0930   PROTEINUR NEGATIVE 06/28/2015 0930   UROBILINOGEN 0.2 12/28/2014 2013   NITRITE NEGATIVE 06/28/2015 0930   LEUKOCYTESUR NEGATIVE 06/28/2015 0930   Recent Results (from the past 240 hour(s))  Resp Panel by RT-PCR (Flu A&B, Covid) Nasopharyngeal Swab     Status: None   Collection Time: 02/06/21 11:55 AM   Specimen: Nasopharyngeal Swab; Nasopharyngeal(NP) swabs in vial transport medium  Result Value Ref Range Status   SARS Coronavirus 2 by RT PCR NEGATIVE NEGATIVE Final    Comment: (NOTE) SARS-CoV-2 target nucleic acids are NOT DETECTED.  The SARS-CoV-2 RNA is generally detectable in upper respiratory specimens during the acute phase of infection. The lowest concentration of SARS-CoV-2 viral copies this assay can detect is 138 copies/mL. A negative result does not preclude SARS-Cov-2 infection and should not be used as the sole basis for treatment or other patient management decisions. A  negative result may occur with  improper specimen collection/handling, submission of specimen other than nasopharyngeal swab, presence of viral mutation(s) within the areas targeted by this assay, and inadequate number of viral copies(<138 copies/mL). A negative result must be combined with clinical observations, patient history, and epidemiological information. The expected result is Negative.  Fact Sheet for Patients:  BloggerCourse.com  Fact Sheet for Healthcare Providers:  SeriousBroker.it  This test is no t yet approved or cleared by the [place] FDA and  has been authorized for detection and/or diagnosis of SARS-CoV-2 by FDA under an Emergency Use Authorization (EUA). This EUA will remain  in effect (meaning this test can be used) for the duration of the COVID-19 declaration under Section 564(b)(1) of the Act, 21 U.S.C.section  360bbb-3(b)(1), unless the authorization is terminated  or revoked sooner.       Influenza A by PCR NEGATIVE NEGATIVE Final   Influenza B by PCR NEGATIVE NEGATIVE Final    Comment: (NOTE) The Xpert Xpress SARS-CoV-2/FLU/RSV plus assay is intended as an aid in the diagnosis of influenza from Nasopharyngeal swab specimens and should not be used as a sole basis for treatment. Nasal washings and aspirates are unacceptable for Xpert Xpress SARS-CoV-2/FLU/RSV testing.  Fact Sheet for Patients: BloggerCourse.com  Fact Sheet for Healthcare Providers: SeriousBroker.it  This test is not yet approved or cleared by the [place] FDA and has been authorized for detection and/or diagnosis of SARS-CoV-2 by FDA under an Emergency Use Authorization (EUA). This EUA will remain in effect (meaning this test can be used) for the duration of the COVID-19 declaration under Section 564(b)(1) of the Act, 21 U.S.C. section 360bbb-3(b)(1), unless the authorization is terminated or revoked.  Performed at [facility], [address], [place], [place] [address]       Radiology Studies: CT ABDOMEN PELVIS WO CONTRAST  Result Date: 02/06/2021 CLINICAL DATA:  Abdominal pain, hernia suspected. Status post manual attempted reduction. Vomiting and diarrhea history of urinary diversion. EXAM: CT ABDOMEN AND PELVIS WITHOUT CONTRAST TECHNIQUE: Multidetector CT imaging of the abdomen and pelvis was performed following the standard protocol without IV contrast. COMPARISON:  CT 2-1/2 hours ago. FINDINGS: Lower chest: Stable chronic right lung base scarring. No acute findings or change from earlier today. Hepatobiliary: No focal liver abnormality is seen. Status post cholecystectomy. No biliary dilatation. Pancreas: Mild parenchymal atrophy. No ductal dilatation or inflammation. Spleen: Normal in size without focal abnormality. Adrenals/Urinary  Tract: No adrenal nodule. Stable mild left adrenal thickening. There is no hydronephrosis or renal calculi. Status post cystectomy with urinary diversion. No ureteral dilatation. Stable 15 mm stone in the ureteral confluence, series 2, image 78. The ostomy is nondilated. Stomach/Bowel: Small-bowel obstruction secondary to umbilical hernia without significant change in the interim. Prominently fluid-filled distended stomach. There is fluid distending the distal esophagus. Small bowel is dilated and fluid-filled to the level of umbilical hernia. The exiting small bowel is completely decompressed. The hernia neck spans 13 mm, measured on series 2, image 56. There is minimal mesenteric edema. No bowel pneumatosis. Small bowel distal to the hernia is decompressed. Ileal colonic anastomosis in the right upper quadrant there is small volume of colonic stool. Distal descending and sigmoid colonic diverticulosis without diverticulitis. Vascular/Lymphatic: Aortic atherosclerosis. No aortic aneurysm. No portal venous or mesenteric gas. No bulky abdominopelvic adenopathy. Reproductive: Hysterectomy.  No adnexal mass. Other: No ascites or free air. Umbilical hernia containing a short segment of small bowel with associated small bowel obstruction. There is skin and  soft tissue thickening of the left anterior abdominal wall, typical of medication injection site. Small fat containing inguinal hernias. Musculoskeletal: Stable from earlier today. IMPRESSION: 1. Persistent and unchanged small-bowel obstruction secondary to umbilical hernia. No evidence of perforation. 2. Status post cystectomy with urinary diversion. Unchanged 15 mm stone in the ureteral confluence. 3. Colonic diverticulosis without diverticulitis. Aortic Atherosclerosis (ICD10-I70.0). Electronically Signed   By: Narda Rutherford M.D.   On: 02/06/2021 18:01   CT ABDOMEN PELVIS WO CONTRAST  Result Date: 02/06/2021 CLINICAL DATA:  Nausea, diarrhea and vomiting.  Diverticulitis suspected. History of interstitial cystitis and status post cystectomy with urinary diversion. EXAM: CT ABDOMEN AND PELVIS WITHOUT CONTRAST TECHNIQUE: Multidetector CT imaging of the abdomen and pelvis was performed following the standard protocol without IV contrast. COMPARISON:  06/24/2015 and 12/28/2014 FINDINGS: Lower chest: Chronic volume loss or scarring in the medial right lung base. Otherwise, lung bases are clear. No pleural effusions. Hepatobiliary: Cholecystectomy.  Normal appearance of liver. Pancreas: Unremarkable. No pancreatic ductal dilatation or surrounding inflammatory changes. Spleen: Normal in size without focal abnormality. Adrenals/Urinary Tract: Normal appearance of the adrenal glands. Kidneys are slightly small for size. No hydronephrosis or ureter stones. Status post cystectomy with urinary diversion or ostomy in the right lower quadrant. Ileal conduit is not distended but there is a large stone within the conduit that measures 1.5 cm and this is new. No dilatation of the ureters. Stomach/Bowel: Stomach is distended with air-fluid level. Dilated loops of proximal small bowel containing fluid. Transition point appears to be related to a supraumbilical ventral hernia. The small bowel distal to the hernia is decompressed. Evidence for an ileocolic anastomosis in the right abdomen. No colonic dilatation. Multiple colonic diverticula particularly in the sigmoid colon. No colonic inflammation. Vascular/Lymphatic: Atherosclerotic calcifications in the abdominal aorta and iliac arteries without aneurysm. No significant lymph node enlargement in the abdomen or pelvis. Reproductive: Status post hysterectomy. No adnexal masses. Other: Negative for free fluid. Negative for free air. Small left inguinal hernia containing fat. Musculoskeletal: Degenerative facet arthropathy in the lower lumbar spine. Chronic areas of sclerosis in the lower thoracic spine. Chronic skin and superficial soft  tissue thickening in the left anterior abdomen. IMPRESSION: 1. Small bowel obstruction secondary to an incarcerated ventral hernia. Distended stomach and small bowel loops with a transition point associated with a ventral hernia. No evidence for free fluid. 2. Postsurgical changes associated with a cystectomy and ileal conduit. Negative for hydronephrosis. However, there is a large stone within the iliac conduit measuring up to 1.5 cm. 3.  Aortic Atherosclerosis (ICD10-I70.0). 4. Colonic diverticulosis but no evidence for acute diverticulitis. These results were called by telephone at the time of interpretation on 02/06/2021 at 3:04 pm to provider Pricilla Loveless , who verbally acknowledged these results. Electronically Signed   By: Richarda Overlie M.D.   On: 02/06/2021 15:05    Scheduled Meds:  heparin       heparin  5,000 Units Subcutaneous Q8H   insulin aspart  0-6 Units Subcutaneous Q4H   mometasone-formoterol  2 puff Inhalation BID   pantoprazole (PROTONIX) IV  40 mg Intravenous Q24H   sodium chloride flush  3 mL Intravenous Q12H   Continuous Infusions:  sodium chloride 125 mL/hr at 02/07/21 0641   acetaminophen       LOS: 0 days   Time spent: 25 minutes.  Tyrone Nine, MD [facility] [email or web address] 02/07/2021, 12:04 PM

## 2021-02-07 NOTE — Progress Notes (Signed)
Progress Note  1 Day Post-Op  Subjective: Patient reports some abdominal soreness. Was seen in PACU. No flatus yet.   Objective: Vital signs in last 24 hours: Temp:  [98.1 F (36.7 C)-98.7 F (37.1 C)] 98.3 F (36.8 C) (11/01 0603) Pulse Rate:  [66-94] 84 (11/01 0700) Resp:  [12-24] 15 (11/01 0700) BP: (88-146)/(52-80) 117/58 (11/01 0700) SpO2:  [86 %-100 %] 95 % (11/01 0700) Weight:  [90.7 kg] 90.7 kg (10/31 1053)    Intake/Output from previous day: 10/31 0701 - 11/01 0700 In: 2678.8 [I.V.:1762.5; IV Piggyback:916.3] Out: 400 [Urine:100; Emesis/NG output:200; Blood:100] Intake/Output this shift: No intake/output data recorded.  PE: General: pleasant, WD, obese female who is laying in bed in NAD Heart: regular, rate, and rhythm.   Lungs:  Respiratory effort nonlabored Abd: soft, appropriately ttp, ND, NGT with bilious drainage, surgical dressing c/d/I  Skin: warm and dry with no masses, lesions, or rashes Psych: A&Ox3 with an appropriate affect.    Lab Results:  Recent Labs    02/06/21 1155 02/07/21 0546  WBC 11.1* 20.4*  HGB 13.8 11.7*  HCT 40.9 35.8*  PLT 507* 396   BMET Recent Labs    02/06/21 1315 02/07/21 0546  NA 137 138  K 5.1 4.6  CL 102 106  CO2 26 20*  GLUCOSE 130* 189*  BUN 34* 43*  CREATININE 4.19* 4.81*  CALCIUM 9.4 8.0*   PT/INR No results for input(s): LABPROT, INR in the last 72 hours. CMP     Component Value Date/Time   NA 138 02/07/2021 0546   K 4.6 02/07/2021 0546   CL 106 02/07/2021 0546   CO2 20 (L) 02/07/2021 0546   GLUCOSE 189 (H) 02/07/2021 0546   BUN 43 (H) 02/07/2021 0546   CREATININE 4.81 (H) 02/07/2021 0546   CALCIUM 8.0 (L) 02/07/2021 0546   PROT 6.7 02/07/2021 0546   ALBUMIN 3.3 (L) 02/07/2021 0546   AST 25 02/07/2021 0546   ALT 18 02/07/2021 0546   ALKPHOS 55 02/07/2021 0546   BILITOT 0.7 02/07/2021 0546   GFRNONAA 9 (L) 02/07/2021 0546   GFRAA 33 (L) 07/01/2015 0352   Lipase     Component Value  Date/Time   LIPASE 34 02/06/2021 1315       Studies/Results: CT ABDOMEN PELVIS WO CONTRAST  Result Date: 02/06/2021 CLINICAL DATA:  Abdominal pain, hernia suspected. Status post manual attempted reduction. Vomiting and diarrhea history of urinary diversion. EXAM: CT ABDOMEN AND PELVIS WITHOUT CONTRAST TECHNIQUE: Multidetector CT imaging of the abdomen and pelvis was performed following the standard protocol without IV contrast. COMPARISON:  CT 2-1/2 hours ago. FINDINGS: Lower chest: Stable chronic right lung base scarring. No acute findings or change from earlier today. Hepatobiliary: No focal liver abnormality is seen. Status post cholecystectomy. No biliary dilatation. Pancreas: Mild parenchymal atrophy. No ductal dilatation or inflammation. Spleen: Normal in size without focal abnormality. Adrenals/Urinary Tract: No adrenal nodule. Stable mild left adrenal thickening. There is no hydronephrosis or renal calculi. Status post cystectomy with urinary diversion. No ureteral dilatation. Stable 15 mm stone in the ureteral confluence, series 2, image 78. The ostomy is nondilated. Stomach/Bowel: Small-bowel obstruction secondary to umbilical hernia without significant change in the interim. Prominently fluid-filled distended stomach. There is fluid distending the distal esophagus. Small bowel is dilated and fluid-filled to the level of umbilical hernia. The exiting small bowel is completely decompressed. The hernia neck spans 13 mm, measured on series 2, image 56. There is minimal mesenteric edema. No bowel pneumatosis.  Small bowel distal to the hernia is decompressed. Ileal colonic anastomosis in the right upper quadrant there is small volume of colonic stool. Distal descending and sigmoid colonic diverticulosis without diverticulitis. Vascular/Lymphatic: Aortic atherosclerosis. No aortic aneurysm. No portal venous or mesenteric gas. No bulky abdominopelvic adenopathy. Reproductive: Hysterectomy.  No adnexal  mass. Other: No ascites or free air. Umbilical hernia containing a short segment of small bowel with associated small bowel obstruction. There is skin and soft tissue thickening of the left anterior abdominal wall, typical of medication injection site. Small fat containing inguinal hernias. Musculoskeletal: Stable from earlier today. IMPRESSION: 1. Persistent and unchanged small-bowel obstruction secondary to umbilical hernia. No evidence of perforation. 2. Status post cystectomy with urinary diversion. Unchanged 15 mm stone in the ureteral confluence. 3. Colonic diverticulosis without diverticulitis. Aortic Atherosclerosis (ICD10-I70.0). Electronically Signed   By: Narda Rutherford M.D.   On: 02/06/2021 18:01   CT ABDOMEN PELVIS WO CONTRAST  Result Date: 02/06/2021 CLINICAL DATA:  Nausea, diarrhea and vomiting. Diverticulitis suspected. History of interstitial cystitis and status post cystectomy with urinary diversion. EXAM: CT ABDOMEN AND PELVIS WITHOUT CONTRAST TECHNIQUE: Multidetector CT imaging of the abdomen and pelvis was performed following the standard protocol without IV contrast. COMPARISON:  06/24/2015 and 12/28/2014 FINDINGS: Lower chest: Chronic volume loss or scarring in the medial right lung base. Otherwise, lung bases are clear. No pleural effusions. Hepatobiliary: Cholecystectomy.  Normal appearance of liver. Pancreas: Unremarkable. No pancreatic ductal dilatation or surrounding inflammatory changes. Spleen: Normal in size without focal abnormality. Adrenals/Urinary Tract: Normal appearance of the adrenal glands. Kidneys are slightly small for size. No hydronephrosis or ureter stones. Status post cystectomy with urinary diversion or ostomy in the right lower quadrant. Ileal conduit is not distended but there is a large stone within the conduit that measures 1.5 cm and this is new. No dilatation of the ureters. Stomach/Bowel: Stomach is distended with air-fluid level. Dilated loops of proximal  small bowel containing fluid. Transition point appears to be related to a supraumbilical ventral hernia. The small bowel distal to the hernia is decompressed. Evidence for an ileocolic anastomosis in the right abdomen. No colonic dilatation. Multiple colonic diverticula particularly in the sigmoid colon. No colonic inflammation. Vascular/Lymphatic: Atherosclerotic calcifications in the abdominal aorta and iliac arteries without aneurysm. No significant lymph node enlargement in the abdomen or pelvis. Reproductive: Status post hysterectomy. No adnexal masses. Other: Negative for free fluid. Negative for free air. Small left inguinal hernia containing fat. Musculoskeletal: Degenerative facet arthropathy in the lower lumbar spine. Chronic areas of sclerosis in the lower thoracic spine. Chronic skin and superficial soft tissue thickening in the left anterior abdomen. IMPRESSION: 1. Small bowel obstruction secondary to an incarcerated ventral hernia. Distended stomach and small bowel loops with a transition point associated with a ventral hernia. No evidence for free fluid. 2. Postsurgical changes associated with a cystectomy and ileal conduit. Negative for hydronephrosis. However, there is a large stone within the iliac conduit measuring up to 1.5 cm. 3.  Aortic Atherosclerosis (ICD10-I70.0). 4. Colonic diverticulosis but no evidence for acute diverticulitis. These results were called by telephone at the time of interpretation on 02/06/2021 at 3:04 pm to provider Pricilla Loveless , who verbally acknowledged these results. Electronically Signed   By: Richarda Overlie M.D.   On: 02/06/2021 15:05    Anti-infectives: Anti-infectives (From admission, onward)    Start     Dose/Rate Route Frequency Ordered Stop   02/06/21 2003  ceFAZolin (ANCEF) 2-4 GM/100ML-% IVPB  Note to Pharmacy: Ponciano Ort   : cabinet override      02/06/21 2003 02/06/21 2046   02/06/21 1945  [MAR Hold]  ceFAZolin (ANCEF) IVPB 2g/100 mL premix         (MAR Hold since Mon 02/06/2021 at 1937.Hold Reason: Transfer to a Procedural area)   2 g 200 mL/hr over 30 Minutes Intravenous On call to O.R. 02/06/21 1914 02/06/21 2028        Assessment/Plan Ventral hernia with SBO S/P ventral hernia repair with small bowel resection, LOA 10/31 Dr. 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

## 2021-02-07 NOTE — Discharge Instructions (Signed)
[facility]  UMBILICAL OR INGUINAL HERNIA REPAIR: POST OP INSTRUCTIONS  Always review your discharge instruction sheet given to you by the facility where your surgery was performed. IF YOU HAVE DISABILITY OR FAMILY LEAVE FORMS, YOU MUST BRING THEM TO THE OFFICE FOR PROCESSING.   DO NOT GIVE THEM TO YOUR DOCTOR.  A  prescription for pain medication may be given to you upon discharge.  Take your pain medication as prescribed, if needed.  If narcotic pain medicine is not needed, then you may take acetaminophen (Tylenol), naprosyn (Alleve) or ibuprofen (Advil) as needed. Take your usually prescribed medications unless otherwise directed. If you need a refill on your pain medication, please contact your pharmacy.  They will contact our office to request authorization. Prescriptions will not be filled after 5 pm or on week-ends. You should follow a light diet the first 24 hours after arrival home, such as soup and crackers, etc.  Be sure to include lots of fluids daily.  Resume your normal diet the day after surgery. Most patients will experience some swelling and bruising around the umbilicus or in the groin and scrotum.  Ice packs and reclining will help.  Swelling and bruising can take several days to resolve.  It is common to experience some constipation if taking pain medication after surgery.  Increasing fluid intake and taking a stool softener (such as Colace) will usually help or prevent this problem from occurring.  A mild laxative (Milk of Magnesia or Miralax) should be taken according to package directions if there are no bowel movements after 48 hours. Unless discharge instructions indicate otherwise, you may remove your bandages 48 hours after surgery, and you may shower at that time.  You may have steri-strips (small skin tapes) in place directly over the incision.  These strips should be left on the skin for 7-10 days and will come off on their own.  If your surgeon used  skin glue on the incision, you may shower in 24 hours.  The glue will flake off over the next 2-3 weeks.  Any sutures or staples will be removed at the office during your follow-up visit. ACTIVITIES:  You may resume regular (light) daily activities beginning the next day--such as daily self-care, walking, climbing stairs--gradually increasing activities as tolerated.  You may have sexual intercourse when it is comfortable.  Refrain from any heavy lifting or straining until approved by your doctor. You may drive when you are no longer taking prescription pain medication, you can comfortably wear a seatbelt, and you can safely maneuver your car and apply brakes. RETURN TO WORK:  __________________________________________________________ You should see your doctor in the office for a follow-up appointment approximately 2-3 weeks after your surgery.  Make sure that you call for this appointment within a day or two after you arrive home to insure a convenient appointment time. OTHER INSTRUCTIONS:  __________________________________________________________________________________________________________________________________________________________________________________________  WHEN TO CALL YOUR DOCTOR: Fever over 101.0 Inability to urinate Nausea and/or vomiting Extreme swelling or bruising Continued bleeding from incision. Increased pain, redness, or drainage from the incision  The clinic staff is available to answer your questions during regular business hours.  Please don't hesitate to call and ask to speak to one of the nurses for clinical concerns.  If you have a medical emergency, go to the nearest emergency room or call 911.  A surgeon from [facility] is always on call at the hospital   [address], [address], [place],     [address] ?  [address] [address], [place], [place]   [address] [phone] ? [phone] ? FAX [phone] Web site:  [email or web address]      Managing Your Pain After Surgery Without Opioids    Thank you for participating in our program to help patients manage their pain after surgery without opioids. This is part of our effort to provide you with the best care possible, without exposing you or your family to the risk that opioids pose.  What pain can I expect after surgery? You can expect to have some pain after surgery. This is normal. The pain is typically worse the day after surgery, and quickly begins to get better. Many studies have found that many patients are able to manage their pain after surgery with Over-the-Counter (OTC) medications such as Tylenol and Motrin. If you have a condition that does not allow you to take Tylenol or Motrin, notify your surgical team.  How will I manage my pain? The best strategy for controlling your pain after surgery is around the clock pain control with Tylenol (acetaminophen) and Motrin (ibuprofen or Advil). Alternating these medications with each other allows you to maximize your pain control. In addition to Tylenol and Motrin, you can use heating pads or ice packs on your incisions to help reduce your pain.  How will I alternate your regular strength over-the-counter pain medication? You will take a dose of pain medication every three hours. Start by taking 650 mg of Tylenol (2 pills of 325 mg) 3 hours later take 600 mg of Motrin (3 pills of 200 mg) 3 hours after taking the Motrin take 650 mg of Tylenol 3 hours after that take 600 mg of Motrin.   - 1 -  See example - if your first dose of Tylenol is at 12:00 PM   12:00 PM Tylenol 650 mg (2 pills of 325 mg)  3:00 PM Motrin 600 mg (3 pills of 200 mg)  6:00 PM Tylenol 650 mg (2 pills of 325 mg)  9:00 PM Motrin 600 mg (3 pills of 200 mg)  Continue alternating every 3 hours   We recommend that you follow this schedule around-the-clock for at least 3 days after surgery, or until you feel that  it is no longer needed. Use the table on the last page of this handout to keep track of the medications you are taking. Important: Do not take more than 3000mg of Tylenol or 3200mg of Motrin in a 24-hour period. Do not take ibuprofen/Motrin if you have a history of bleeding stomach ulcers, severe kidney disease, &/or actively taking a blood thinner  What if I still have pain? If you have pain that is not controlled with the over-the-counter pain medications (Tylenol and Motrin or Advil) you might have what we call "breakthrough" pain. You will receive a prescription for a small amount of an opioid pain medication such as Oxycodone, Tramadol, or Tylenol with Codeine. Use these opioid pills in the first 24 hours after surgery if you have breakthrough pain. Do not take more than 1 pill every 4-6 hours.  If you still have uncontrolled pain after using all opioid pills, don't hesitate to call our staff using the number provided. We will help make sure you are managing your pain in the best way possible, and if necessary, we can provide a prescription for additional pain medication.   Day 1    Time  Name of Medication Number of pills taken  Amount of Acetaminophen  Pain Level     Comments  AM PM       AM PM       AM PM       AM PM       AM PM       AM PM       AM PM       AM PM       Total Daily amount of Acetaminophen Do not take more than  3,000 mg per day      Day 2    Time  Name of Medication Number of pills taken  Amount of Acetaminophen  Pain Level   Comments  AM PM       AM PM       AM PM       AM PM       AM PM       AM PM       AM PM       AM PM       Total Daily amount of Acetaminophen Do not take more than  3,000 mg per day      Day 3    Time  Name of Medication Number of pills taken  Amount of Acetaminophen  Pain Level   Comments  AM PM       AM PM       AM PM       AM PM          AM PM       AM PM       AM PM       AM PM       Total Daily  amount of Acetaminophen Do not take more than  3,000 mg per day      Day 4    Time  Name of Medication Number of pills taken  Amount of Acetaminophen  Pain Level   Comments  AM PM       AM PM       AM PM       AM PM       AM PM       AM PM       AM PM       AM PM       Total Daily amount of Acetaminophen Do not take more than  3,000 mg per day      Day 5    Time  Name of Medication Number of pills taken  Amount of Acetaminophen  Pain Level   Comments  AM PM       AM PM       AM PM       AM PM       AM PM       AM PM       AM PM       AM PM       Total Daily amount of Acetaminophen Do not take more than  3,000 mg per day       Day 6    Time  Name of Medication Number of pills taken  Amount of Acetaminophen  Pain Level  Comments  AM PM       AM PM       AM PM       AM PM       AM PM       AM PM       AM   PM       AM PM       Total Daily amount of Acetaminophen Do not take more than  3,000 mg per day      Day 7    Time  Name of Medication Number of pills taken  Amount of Acetaminophen  Pain Level   Comments  AM PM       AM PM       AM PM       AM PM       AM PM       AM PM       AM PM       AM PM       Total Daily amount of Acetaminophen Do not take more than  3,000 mg per day        For additional information about how and where to safely dispose of unused opioid medications - https://www.morepowerful[employer].org  Disclaimer: This document contains information and/or instructional materials adapted from [facility] for the typical patient with your condition. It does not replace medical advice from your health care provider because your experience may differ from that of the typical patient. Talk to your health care provider if you have any questions about this document, your condition or your treatment plan. Adapted from [facility]  

## 2021-02-07 NOTE — Progress Notes (Addendum)
Pt noted to be more confused this evening. Continues to be A&Ox2. Patient pulled NG tube out. Patient educated on to why NG tube is placed. MD notified and advised to reinsert NG tube. NG tube reinserted. Will verify placement.

## 2021-02-07 NOTE — Progress Notes (Signed)
Abd XR complete and indicated NGT needed to be advanced.  RN advanced tube and set to LIWS with redish/brownish drainage.

## 2021-02-07 NOTE — Progress Notes (Signed)
Patient has a urostomy stoma that was covered with a bandage upon arrival for surgery. At 0400 she had no urine output from ostomy which had an appliance placed over it during surgery. She was able to explain she self caths the stoma 4-5 times per day. She believes her supplies are in her pocket book. She presented with a suitcase of belongings, there was no pocketbook. ER was notified to look for a purse and we will call her sister closer to morning and ask if she took her purse home. Dr. Garner Nash was notified of the situation and ordered for the patient to self cath as per home.

## 2021-02-08 DIAGNOSIS — E669 Obesity, unspecified: Secondary | ICD-10-CM | POA: Diagnosis not present

## 2021-02-08 DIAGNOSIS — K436 Other and unspecified ventral hernia with obstruction, without gangrene: Secondary | ICD-10-CM | POA: Diagnosis not present

## 2021-02-08 DIAGNOSIS — N179 Acute kidney failure, unspecified: Secondary | ICD-10-CM | POA: Diagnosis not present

## 2021-02-08 LAB — URINALYSIS, COMPLETE (UACMP) WITH MICROSCOPIC
Bilirubin Urine: NEGATIVE
Glucose, UA: NEGATIVE mg/dL
Ketones, ur: NEGATIVE mg/dL
Leukocytes,Ua: NEGATIVE
Nitrite: NEGATIVE
Protein, ur: NEGATIVE mg/dL
Specific Gravity, Urine: 1.011 (ref 1.005–1.030)
pH: 6 (ref 5.0–8.0)

## 2021-02-08 LAB — SURGICAL PATHOLOGY

## 2021-02-08 LAB — BASIC METABOLIC PANEL
Anion gap: 10 (ref 5–15)
BUN: 34 mg/dL — ABNORMAL HIGH (ref 8–23)
CO2: 17 mmol/L — ABNORMAL LOW (ref 22–32)
Calcium: 8.4 mg/dL — ABNORMAL LOW (ref 8.9–10.3)
Chloride: 114 mmol/L — ABNORMAL HIGH (ref 98–111)
Creatinine, Ser: 1.76 mg/dL — ABNORMAL HIGH (ref 0.44–1.00)
GFR, Estimated: 32 mL/min — ABNORMAL LOW (ref >60)
Glucose, Bld: 120 mg/dL — ABNORMAL HIGH (ref 70–99)
Potassium: 4.7 mmol/L (ref 3.5–5.1)
Sodium: 141 mmol/L (ref 135–145)

## 2021-02-08 LAB — GLUCOSE, CAPILLARY
Glucose-Capillary: 126 mg/dL — ABNORMAL HIGH (ref 70–99)
Glucose-Capillary: 121 mg/dL — ABNORMAL HIGH (ref 70–99)
Glucose-Capillary: 133 mg/dL — ABNORMAL HIGH (ref 70–99)
Glucose-Capillary: 123 mg/dL — ABNORMAL HIGH (ref 70–99)
Glucose-Capillary: 107 mg/dL — ABNORMAL HIGH (ref 70–99)
Glucose-Capillary: 119 mg/dL — ABNORMAL HIGH (ref 70–99)

## 2021-02-08 LAB — CBC
HCT: 36.1 % (ref 36.0–46.0)
Hemoglobin: 12 g/dL (ref 12.0–15.0)
MCH: 33 pg (ref 26.0–34.0)
MCHC: 33.2 g/dL (ref 30.0–36.0)
MCV: 99.2 fL (ref 80.0–100.0)
Platelets: 353 10*3/uL (ref 150–400)
RBC: 3.64 MIL/uL — ABNORMAL LOW (ref 3.87–5.11)
RDW: 14 % (ref 11.5–15.5)
WBC: 21.9 10*3/uL — ABNORMAL HIGH (ref 4.0–10.5)
nRBC: 0.3 % — ABNORMAL HIGH (ref 0.0–0.2)

## 2021-02-08 MED ORDER — ACETAMINOPHEN 10 MG/ML IV SOLN
1000.0000 mg | Freq: Four times a day (QID) | INTRAVENOUS | Status: AC
  Administered 2021-02-08 – 2021-02-09 (×4): 1000 mg via INTRAVENOUS
  Filled 2021-02-08 (×4): qty 100

## 2021-02-08 MED ORDER — METHOCARBAMOL 1000 MG/10ML IJ SOLN
500.0000 mg | Freq: Three times a day (TID) | INTRAVENOUS | Status: DC
  Administered 2021-02-08 – 2021-02-14 (×19): 500 mg via INTRAVENOUS
  Filled 2021-02-08 (×12): qty 500
  Filled 2021-02-08: qty 5
  Filled 2021-02-08: qty 500
  Filled 2021-02-08: qty 5
  Filled 2021-02-08 (×6): qty 500

## 2021-02-08 NOTE — Evaluation (Signed)
Occupational Therapy Evaluation Patient Details Name: Laurie [name] MRN: [id number] DOB: 07-07-1955 Today's Date: 02/08/2021   History of Present Illness Laurie [name] is a 65 y.o. female with a history of T2DM, HTN, HLD, anxiety/depression, GERD, obesity and multiple abdominopelvic surgeries who presented to the ED 02/06/21 with abdominal pain , CT revealed an incarcerated ventral hernia. S/P small bowell resection, hernia repair and lysis of adhesions 02/06/21.   Clinical Impression   Patient is a 65 year old female who was previously living at home with boyfriend support 24/7. Patient was noted to have decreased functional activity tolerance, increased pain and decreased knowledge  of AE/AD impacting patients participation in ADLs. Patient would continue to benefit from skilled OT services at this time while admitted and after d/c to address noted deficits in order to improve overall safety and independence in ADLs.       Recommendations for follow up therapy are one component of a multi-disciplinary discharge planning process, led by the attending physician.  Recommendations may be updated based on patient status, additional functional criteria and insurance authorization.   Follow Up Recommendations  Home health OT    Assistance Recommended at Discharge Frequent or constant Supervision/Assistance  Functional Status Assessment  Patient has had a recent decline in their functional status and demonstrates the ability to make significant improvements in function in a reasonable and predictable amount of time.  Equipment Recommendations       Recommendations for Other Services       Precautions / Restrictions Precautions Precautions: Fall Precaution Comments: abdomen, NG suction, ice chips only- wants to drink melted ice Restrictions Weight Bearing Restrictions: No      Mobility Bed Mobility Overal bed mobility: Needs Assistance Bed Mobility: Rolling;Sidelying to  Sit Rolling: Mod assist         General bed mobility comments: education on technique    Transfers Overall transfer level: Needs assistance Equipment used: Rolling walker (2 wheels) Transfers: Sit to/from Stand;Stand Pivot Transfers Sit to Stand: Min assist Stand pivot transfers: Min assist         General transfer comment: transfer to recliner next to bed      Balance Overall balance assessment: Needs assistance Sitting-balance support: Single extremity supported Sitting balance-Leahy Scale: Good     Standing balance support: During functional activity;Single extremity supported Standing balance-Leahy Scale: Fair Standing balance comment: reliant on UE                           ADL either performed or assessed with clinical judgement   ADL Overall ADL's : Needs assistance/impaired Eating/Feeding: NPO   Grooming: Sitting;Wash/dry face;Min guard   Upper Body Bathing: Minimal assistance;Sitting Upper Body Bathing Details (indicate cue type and reason): NG tube management Lower Body Bathing: Maximal assistance;Sitting/lateral leans;Sit to/from stand Lower Body Bathing Details (indicate cue type and reason): pain in abdomen impacting movement. Upper Body Dressing : Minimal assistance;Sitting Upper Body Dressing Details (indicate cue type and reason): NG tube management     Toilet Transfer: Rolling walker (2 wheels);Moderate assistance Toilet Transfer Details (indicate cue type and reason): patient declined to go to bathroom at this time and attempted to self cath at this time. nurse was contacted and patient was left with nursing for continued training on self cathing. Toileting- Clothing Manipulation and Hygiene: Moderate assistance;Sit to/from stand               Vision Patient Visual Report: No change from  baseline       Perception     Praxis      Pertinent Vitals/Pain Pain Assessment: Faces Faces Pain Scale: Hurts even more Pain  Location: back, throat and stomach Pain Descriptors / Indicators: Discomfort;Grimacing;Guarding Pain Intervention(s): Limited activity within patient's tolerance;Monitored during session     Hand Dominance Right   Extremity/Trunk Assessment Upper Extremity Assessment Upper Extremity Assessment: Overall WFL for tasks assessed   Lower Extremity Assessment Lower Extremity Assessment: Defer to PT evaluation   Cervical / Trunk Assessment Cervical / Trunk Assessment: Normal;Other exceptions Cervical / Trunk Exceptions: guarding abdomen when stands   Communication Communication Communication: No difficulties   Cognition Arousal/Alertness: Awake/alert Behavior During Therapy: WFL for tasks assessed/performed;Restless Overall Cognitive Status: Within Functional Limits for tasks assessed                                       General Comments       Exercises     Shoulder Instructions      Home Living Family/patient expects to be discharged to:: Private residence Living Arrangements:  (boyfriend) Available Help at Discharge:  (boyfriend is there 24/7) Type of Home: House Home Access: Stairs to enter Secretary/administrator of Steps: 2   Home Layout: One level     Bathroom Shower/Tub: Tub/shower unit         Home Equipment: Agricultural consultant (2 wheels)          Prior Functioning/Environment                 ADLs Comments: patient has help from family member to step over tub and as needed "if i have a bad night"        OT Problem List: Impaired balance (sitting and/or standing);Decreased activity tolerance;Decreased knowledge of use of DME or AE;Decreased knowledge of precautions;Pain      OT Treatment/Interventions: Self-care/ADL training;Energy conservation;Therapeutic exercise;Neuromuscular education;DME and/or AE instruction;Balance training;Patient/family education;Therapeutic activities    OT Goals(Current goals can be found in the care  plan section) Acute Rehab OT Goals Patient Stated Goal: to self cath OT Goal Formulation: With patient Time For Goal Achievement: 02/22/21 Potential to Achieve Goals: Good  OT Frequency: Min 2X/week   Barriers to D/C:            Co-evaluation              AM-PAC OT "6 Clicks" Daily Activity     Outcome Measure Help from another person eating meals?: Total (npo) Help from another person taking care of personal grooming?: A Little Help from another person toileting, which includes using toliet, bedpan, or urinal?: A Lot Help from another person bathing (including washing, rinsing, drying)?: A Lot Help from another person to put on and taking off regular upper body clothing?: A Little Help from another person to put on and taking off regular lower body clothing?: A Lot 6 Click Score: 13   End of Session Equipment Utilized During Treatment: Rolling walker (2 wheels) Nurse Communication: Other (comment) (nurse cleared patient to participate)  Activity Tolerance: Patient tolerated treatment well Patient left: in chair;with call bell/phone within reach;with nursing/sitter in room  OT Visit Diagnosis: Unsteadiness on feet (R26.81);Other abnormalities of gait and mobility (R26.89)                Time: 6301-6010 OT Time Calculation (min): 24 min Charges:  OT General Charges $OT Visit: 1  Visit OT Evaluation $OT Eval Low Complexity: 1 Low OT Treatments $Self Care/Home Management : 8-22 mins  Sharyn Blitz OTR/L, MS Acute Rehabilitation Department Office# [phone] Pager# [phone]   Chalmers Guest Mishell Donalson 02/08/2021, 1:49 PM

## 2021-02-08 NOTE — Progress Notes (Signed)
Progress Note  2 Days Post-Op  Subjective: Patient NGT was removed accidentally last night but replaced. She reports incisional abdominal pain and throat irritation. She is not passing any flatus yet. Was able to get up to chair with PT yesterday. We dicussed importance of mobilization today.   Objective: Vital signs in last 24 hours: Temp:  [98 F (36.7 C)-100.1 F (37.8 C)] 98 F (36.7 C) (11/02 0353) Pulse Rate:  [77-88] 88 (11/02 0353) Resp:  [16-20] 20 (11/02 0353) BP: (117-187)/(56-95) 128/67 (11/02 0353) SpO2:  [92 %-95 %] 95 % (11/02 0823)    Intake/Output from previous day: 11/01 0701 - 11/02 0700 In: 1375 [I.V.:1375] Out: 1910 [Urine:1900; Emesis/NG output:10] Intake/Output this shift: No intake/output data recorded.  PE: General: pleasant, WD, obese female who is laying in bed in NAD Heart: regular, rate, and rhythm.   Lungs:  Respiratory effort nonlabored Abd: soft, appropriately ttp, mild distention, BS hypoactive, NGT with bilious drainage, midline wound clean with some fibrinous tissue in wound base Skin: warm and dry with no masses, lesions, or rashes Psych: A&Ox3 with an appropriate affect.    Lab Results:  Recent Labs    02/07/21 0546 02/08/21 0541  WBC 20.4* 21.9*  HGB 11.7* 12.0  HCT 35.8* 36.1  PLT 396 353   BMET Recent Labs    02/07/21 0546 02/08/21 0541  NA 138 141  K 4.6 4.7  CL 106 114*  CO2 20* 17*  GLUCOSE 189* 120*  BUN 43* 34*  CREATININE 4.81* 1.76*  CALCIUM 8.0* 8.4*   PT/INR No results for input(s): LABPROT, INR in the last 72 hours. CMP     Component Value Date/Time   NA 141 02/08/2021 0541   K 4.7 02/08/2021 0541   CL 114 (H) 02/08/2021 0541   CO2 17 (L) 02/08/2021 0541   GLUCOSE 120 (H) 02/08/2021 0541   BUN 34 (H) 02/08/2021 0541   CREATININE 1.76 (H) 02/08/2021 0541   CALCIUM 8.4 (L) 02/08/2021 0541   PROT 6.7 02/07/2021 0546   ALBUMIN 3.3 (L) 02/07/2021 0546   AST 25 02/07/2021 0546   ALT 18 02/07/2021  0546   ALKPHOS 55 02/07/2021 0546   BILITOT 0.7 02/07/2021 0546   GFRNONAA 32 (L) 02/08/2021 0541   GFRAA 33 (L) 07/01/2015 0352   Lipase     Component Value Date/Time   LIPASE 34 02/06/2021 1315       Studies/Results: CT ABDOMEN PELVIS WO CONTRAST  Result Date: 02/06/2021 CLINICAL DATA:  Abdominal pain, hernia suspected. Status post manual attempted reduction. Vomiting and diarrhea history of urinary diversion. EXAM: CT ABDOMEN AND PELVIS WITHOUT CONTRAST TECHNIQUE: Multidetector CT imaging of the abdomen and pelvis was performed following the standard protocol without IV contrast. COMPARISON:  CT 2-1/2 hours ago. FINDINGS: Lower chest: Stable chronic right lung base scarring. No acute findings or change from earlier today. Hepatobiliary: No focal liver abnormality is seen. Status post cholecystectomy. No biliary dilatation. Pancreas: Mild parenchymal atrophy. No ductal dilatation or inflammation. Spleen: Normal in size without focal abnormality. Adrenals/Urinary Tract: No adrenal nodule. Stable mild left adrenal thickening. There is no hydronephrosis or renal calculi. Status post cystectomy with urinary diversion. No ureteral dilatation. Stable 15 mm stone in the ureteral confluence, series 2, image 78. The ostomy is nondilated. Stomach/Bowel: Small-bowel obstruction secondary to umbilical hernia without significant change in the interim. Prominently fluid-filled distended stomach. There is fluid distending the distal esophagus. Small bowel is dilated and fluid-filled to the level of umbilical hernia.  The exiting small bowel is completely decompressed. The hernia neck spans 13 mm, measured on series 2, image 56. There is minimal mesenteric edema. No bowel pneumatosis. Small bowel distal to the hernia is decompressed. Ileal colonic anastomosis in the right upper quadrant there is small volume of colonic stool. Distal descending and sigmoid colonic diverticulosis without diverticulitis.  Vascular/Lymphatic: Aortic atherosclerosis. No aortic aneurysm. No portal venous or mesenteric gas. No bulky abdominopelvic adenopathy. Reproductive: Hysterectomy.  No adnexal mass. Other: No ascites or free air. Umbilical hernia containing a short segment of small bowel with associated small bowel obstruction. There is skin and soft tissue thickening of the left anterior abdominal wall, typical of medication injection site. Small fat containing inguinal hernias. Musculoskeletal: Stable from earlier today. IMPRESSION: 1. Persistent and unchanged small-bowel obstruction secondary to umbilical hernia. No evidence of perforation. 2. Status post cystectomy with urinary diversion. Unchanged 15 mm stone in the ureteral confluence. 3. Colonic diverticulosis without diverticulitis. Aortic Atherosclerosis (ICD10-I70.0). Electronically Signed   By: Narda Rutherford M.D.   On: 02/06/2021 18:01   CT ABDOMEN PELVIS WO CONTRAST  Result Date: 02/06/2021 CLINICAL DATA:  Nausea, diarrhea and vomiting. Diverticulitis suspected. History of interstitial cystitis and status post cystectomy with urinary diversion. EXAM: CT ABDOMEN AND PELVIS WITHOUT CONTRAST TECHNIQUE: Multidetector CT imaging of the abdomen and pelvis was performed following the standard protocol without IV contrast. COMPARISON:  06/24/2015 and 12/28/2014 FINDINGS: Lower chest: Chronic volume loss or scarring in the medial right lung base. Otherwise, lung bases are clear. No pleural effusions. Hepatobiliary: Cholecystectomy.  Normal appearance of liver. Pancreas: Unremarkable. No pancreatic ductal dilatation or surrounding inflammatory changes. Spleen: Normal in size without focal abnormality. Adrenals/Urinary Tract: Normal appearance of the adrenal glands. Kidneys are slightly small for size. No hydronephrosis or ureter stones. Status post cystectomy with urinary diversion or ostomy in the right lower quadrant. Ileal conduit is not distended but there is a large  stone within the conduit that measures 1.5 cm and this is new. No dilatation of the ureters. Stomach/Bowel: Stomach is distended with air-fluid level. Dilated loops of proximal small bowel containing fluid. Transition point appears to be related to a supraumbilical ventral hernia. The small bowel distal to the hernia is decompressed. Evidence for an ileocolic anastomosis in the right abdomen. No colonic dilatation. Multiple colonic diverticula particularly in the sigmoid colon. No colonic inflammation. Vascular/Lymphatic: Atherosclerotic calcifications in the abdominal aorta and iliac arteries without aneurysm. No significant lymph node enlargement in the abdomen or pelvis. Reproductive: Status post hysterectomy. No adnexal masses. Other: Negative for free fluid. Negative for free air. Small left inguinal hernia containing fat. Musculoskeletal: Degenerative facet arthropathy in the lower lumbar spine. Chronic areas of sclerosis in the lower thoracic spine. Chronic skin and superficial soft tissue thickening in the left anterior abdomen. IMPRESSION: 1. Small bowel obstruction secondary to an incarcerated ventral hernia. Distended stomach and small bowel loops with a transition point associated with a ventral hernia. No evidence for free fluid. 2. Postsurgical changes associated with a cystectomy and ileal conduit. Negative for hydronephrosis. However, there is a large stone within the iliac conduit measuring up to 1.5 cm. 3.  Aortic Atherosclerosis (ICD10-I70.0). 4. Colonic diverticulosis but no evidence for acute diverticulitis. These results were called by telephone at the time of interpretation on 02/06/2021 at 3:04 pm to provider Pricilla Loveless , who verbally acknowledged these results. Electronically Signed   By: Richarda Overlie M.D.   On: 02/06/2021 15:05   DG Abd Portable 1V  Result Date: 02/07/2021 CLINICAL DATA:  NG tube placement. EXAM: PORTABLE ABDOMEN - 1 VIEW COMPARISON:  CT abdomen and pelvis 02/06/2021.  FINDINGS: Nasogastric tube tip proximal stomach. Side hole is at the level of the gastroesophageal junction. Distended small bowel loops are again noted. There are surgical clips and sutures in the right abdomen. Visualized lung bases are clear. IMPRESSION: 1. Enteric tube tip is in the proximal stomach with side hole at the level of the gastroesophageal junction. Recommend advancing tube. 2. Stable dilated small bowel concerning for small obstruction. Electronically Signed   By: Darliss Cheney M.D.   On: 02/07/2021 19:44    Anti-infectives: Anti-infectives (From admission, onward)    Start     Dose/Rate Route Frequency Ordered Stop   02/06/21 2003  ceFAZolin (ANCEF) 2-4 GM/100ML-% IVPB       Note to Pharmacy: Ponciano Ort   : cabinet override      02/06/21 2003 02/06/21 2046   02/06/21 1945  ceFAZolin (ANCEF) IVPB 2g/100 mL premix        2 g 200 mL/hr over 30 Minutes Intravenous On call to O.R. 02/06/21 1914 02/06/21 2028        Assessment/Plan Ventral hernia with SBO POD#2 S/P ventral hernia repair with small bowel resection, LOA 10/31 Dr. 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

## 2021-02-08 NOTE — Progress Notes (Signed)
Physical Therapy Treatment Patient Details Name: Laurie [name] MRN: [id number] DOB: 13-Sep-1955 Today's Date: 02/08/2021   History of Present Illness Creedence Gilma Bessette is a 65 y.o. female with a history of T2DM, HTN, HLD, anxiety/depression, GERD, obesity and multiple abdominopelvic surgeries who presented to the ED 02/06/21 with abdominal pain , CT revealed an incarcerated ventral hernia. S/P small bowell resection, hernia repair and lysis of adhesions 02/06/21.    PT Comments    Pt with significant improvement in ambulation tolerance, ambulates 150 ft with RW min guard, significantly decreased cadence with narrow BOS, no overt LOB. Pt educated on avoiding breath holding with mobility to protect abdomen, reviewed log rolling and VC for sequencing while performing. Pt requires min A to power to stand from elevated bed, VC for hand placement and breathing out while rising. Pt tolerates remaining up in recliner at EOS with RN, NT and daughter in room. Continue to progress as able.   Recommendations for follow up therapy are one component of a multi-disciplinary discharge planning process, led by the attending physician.  Recommendations may be updated based on patient status, additional functional criteria and insurance authorization.  Follow Up Recommendations  Home health PT     Assistance Recommended at Discharge    Equipment Recommendations  None recommended by PT    Recommendations for Other Services       Precautions / Restrictions Precautions Precautions: Fall Precaution Comments: abdomen, NG suction, ice chips only Restrictions Weight Bearing Restrictions: No     Mobility  Bed Mobility Overal bed mobility: Needs Assistance Bed Mobility: Sidelying to Sit Rolling: Mod assist Sidelying to sit: Mod assist  General bed mobility comments: VC on log rolling and avoid breath holding    Transfers Overall transfer level: Needs assistance Equipment used: Rolling walker  (2 wheels) Transfers: Sit to/from Stand Sit to Stand: Min assist;From elevated surface Stand pivot transfers: Min assist  General transfer comment: VC for hand placement, min A with BUE assisting to power to stand from elevated bed    Ambulation/Gait Ambulation/Gait assistance: Min guard Gait Distance (Feet): 150 Feet Assistive device: Rolling walker (2 wheels) Gait Pattern/deviations: Step-to pattern;Decreased stride length;Narrow base of support Gait velocity: decreased   General Gait Details: pt taking short, very slow steps with RW, maintains narrow BOS, educated on avoiding breath holding and relaxing shoulders while ambulating   Stairs             Wheelchair Mobility    Modified Rankin (Stroke Patients Only)       Balance Overall balance assessment: Needs assistance Sitting-balance support: Single extremity supported Sitting balance-Leahy Scale: Good  Standing balance support: During functional activity;Bilateral upper extremity supported Standing balance-Leahy Scale: Fair Standing balance comment: reliant on UE support       Cognition Arousal/Alertness: Awake/alert Behavior During Therapy: WFL for tasks assessed/performed Overall Cognitive Status: Within Functional Limits for tasks assessed       Exercises      General Comments        Pertinent Vitals/Pain Pain Assessment: Faces Faces Pain Scale: Hurts even more Pain Location: nose and throat Pain Descriptors / Indicators: Grimacing Pain Intervention(s): Limited activity within patient's tolerance;Monitored during session    Home Living   Living Arrangements:  (boyfriend) Available Help at Discharge:  (boyfriend is there 24/7)                    Prior Function            PT  Goals (current goals can now be found in the care plan section) Acute Rehab PT Goals Patient Stated Goal: eat and drink, go home PT Goal Formulation: With patient/family Time For Goal Achievement:  02/21/21 Potential to Achieve Goals: Good Progress towards PT goals: Progressing toward goals    Frequency    Min 3X/week      PT Plan Current plan remains appropriate    Co-evaluation              AM-PAC PT "6 Clicks" Mobility   Outcome Measure  Help needed turning from your back to your side while in a flat bed without using bedrails?: A Lot Help needed moving from lying on your back to sitting on the side of a flat bed without using bedrails?: A Lot Help needed moving to and from a bed to a chair (including a wheelchair)?: A Little Help needed standing up from a chair using your arms (e.g., wheelchair or bedside chair)?: A Little Help needed to walk in hospital room?: A Little Help needed climbing 3-5 steps with a railing? : A Lot 6 Click Score: 15    End of Session Equipment Utilized During Treatment: Gait belt Activity Tolerance: Patient tolerated treatment well Patient left: in chair;with call bell/phone within reach;with nursing/sitter in room;with family/visitor present Nurse Communication: Mobility status PT Visit Diagnosis: Unsteadiness on feet (R26.81);Pain     Time: 1501-1540 PT Time Calculation (min) (ACUTE ONLY): 39 min  Charges:  $Gait Training: 23-37 mins $Therapeutic Activity: 8-22 mins                      Tori Tonyia Marschall PT, DPT 02/08/21, 4:01 PM

## 2021-02-08 NOTE — Progress Notes (Signed)
Laurie [name]  IAX:[id number] DOB: [birth year] DOA: 02/06/2021 PCP: Margit Hanks, MD    Brief Narrative:  6EM w/ a hx of DM2, HTN, HLD, anxiety/depression, GERD, obesity, and multiple abdomino-pelvic surgeries who presented to the ED with 5 days of worsening lower abdominal pain associated with loose stools and vomiting. In the ED CT revealed an incarcerated ventral hernia. EDP attempted reduction though symptoms did not improve. Labs notable for acute renal failure in the setting of dehydration. She was transferred to [facility] ED for surgical consult, repeat CT abdomen showed persistent hernia with bowel obstruction for which she was taken to the OR.  Consultants:  General Surgery  Code Status: FULL CODE  Antimicrobials:  None  DVT prophylaxis: Heparin  Subjective: Working with mobility at time of my evaluation.  Reports ongoing abdominal discomfort and some mild shortness of breath.  Some nausea but no vomiting.  Denies chest pain.  Assessment & Plan:  Incarcerated ventral hernia with SBO Status post repair with small bowel resection and LOA -ongoing care per General Surgery  Acute renal failure Worsened initially postop -Baseline creatinine approximately 1.7 -creatinine dramatically improved today -follow trend  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 Vonita Moss at [facility] urology -history of cystectomy and right colon pouch for interstitial cystitis 1990s -pouch stone is found to be nonobstructing  DM2 CBG well controlled  HTN Blood pressure presently well controlled  COPD Continue usual Dulera and as needed albuterol -no acute exacerbation today  GERD  Depression Continue usual home medical therapy  HLD Statin on hold until oral intake more consistent  Obesity - Body mass index is 34.33 kg/m.   Family Communication: Spoke with husband at bedside Status is: Inpatient   Objective: Blood pressure 128/67, pulse 88, temperature  98 F (36.7 C), temperature source Oral, resp. rate 20, height 5\' 4"  (1.626 m), weight 90.7 kg, SpO2 95 %.  Intake/Output Summary (Last 24 hours) at 02/08/2021 1020 Last data filed at 02/08/2021 1015 Gross per 24 hour  Intake 1378 ml  Output 1900 ml  Net -522 ml   Filed Weights   02/06/21 1053  Weight: 90.7 kg    Examination: General: No acute respiratory distress Lungs: Clear to auscultation bilaterally without wheezes or crackles Cardiovascular: Regular rate and rhythm without murmur gallop or rub normal S1 and S2 Abdomen: Nontender, nondistended, soft, bowel sounds positive, no rebound, no ascites, no appreciable mass Extremities: No significant cyanosis, clubbing, or edema bilateral lower extremities  CBC: Recent Labs  Lab 02/06/21 1155 02/07/21 0546 02/08/21 0541  WBC 11.1* 20.4* 21.9*  NEUTROABS 7.9*  --   --   HGB 13.8 11.7* 12.0  HCT 40.9 35.8* 36.1  MCV 94.5 98.9 99.2  PLT 507* 396 353   Basic Metabolic Panel: Recent Labs  Lab 02/06/21 1315 02/07/21 0546 02/08/21 0541  NA 137 138 141  K 5.1 4.6 4.7  CL 102 106 114*  CO2 26 20* 17*  GLUCOSE 130* 189* 120*  BUN 34* 43* 34*  CREATININE 4.19* 4.81* 1.76*  CALCIUM 9.4 8.0* 8.4*   GFR: Estimated Creatinine Clearance: 34.3 mL/min (A) (by C-G formula based on SCr of 1.76 mg/dL (H)).  Liver Function Tests: Recent Labs  Lab 02/06/21 1315 02/07/21 0546  AST 22 25  ALT 18 18  ALKPHOS 58 55  BILITOT 0.4 0.7  PROT 8.0 6.7  ALBUMIN 4.1 3.3*   Recent Labs  Lab 02/06/21 1315  LIPASE 34    HbA1C: Hgb A1c MFr  Bld  Date/Time Value Ref Range Status  02/07/2021 05:46 AM 6.2 (H) 4.8 - 5.6 % Final    Comment:    (NOTE) Pre diabetes:          5.7%-6.4%  Diabetes:              >6.4%  Glycemic control for   <7.0% adults with diabetes   06/25/2015 05:06 AM 6.2 (H) 4.8 - 5.6 % Final    Comment:    (NOTE)         Pre-diabetes: 5.7 - 6.4         Diabetes: >6.4         Glycemic control for adults with  diabetes: <7.0     CBG: Recent Labs  Lab 02/07/21 1635 02/07/21 2043 02/07/21 2315 02/08/21 0355 02/08/21 0754  GLUCAP 150* 129* 135* 126* 121*    Recent Results (from the past 240 hour(s))  Resp Panel by RT-PCR (Flu A&B, Covid) Nasopharyngeal Swab     Status: None   Collection Time: 02/06/21 11:55 AM   Specimen: Nasopharyngeal Swab; Nasopharyngeal(NP) swabs in vial transport medium  Result Value Ref Range Status   SARS Coronavirus 2 by RT PCR NEGATIVE NEGATIVE Final    Comment: (NOTE) SARS-CoV-2 target nucleic acids are NOT DETECTED.  The SARS-CoV-2 RNA is generally detectable in upper respiratory specimens during the acute phase of infection. The lowest concentration of SARS-CoV-2 viral copies this assay can detect is 138 copies/mL. A negative result does not preclude SARS-Cov-2 infection and should not be used as the sole basis for treatment or other patient management decisions. A negative result may occur with  improper specimen collection/handling, submission of specimen other than nasopharyngeal swab, presence of viral mutation(s) within the areas targeted by this assay, and inadequate number of viral copies(<138 copies/mL). A negative result must be combined with clinical observations, patient history, and epidemiological information. The expected result is Negative.  Fact Sheet for Patients:  BloggerCourse.com  Fact Sheet for Healthcare Providers:  SeriousBroker.it  This test is no t yet approved or cleared by the [place] FDA and  has been authorized for detection and/or diagnosis of SARS-CoV-2 by FDA under an Emergency Use Authorization (EUA). This EUA will remain  in effect (meaning this test can be used) for the duration of the COVID-19 declaration under Section 564(b)(1) of the Act, 21 U.S.C.section 360bbb-3(b)(1), unless the authorization is terminated  or revoked sooner.       Influenza A by PCR  NEGATIVE NEGATIVE Final   Influenza B by PCR NEGATIVE NEGATIVE Final    Comment: (NOTE) The Xpert Xpress SARS-CoV-2/FLU/RSV plus assay is intended as an aid in the diagnosis of influenza from Nasopharyngeal swab specimens and should not be used as a sole basis for treatment. Nasal washings and aspirates are unacceptable for Xpert Xpress SARS-CoV-2/FLU/RSV testing.  Fact Sheet for Patients: BloggerCourse.com  Fact Sheet for Healthcare Providers: SeriousBroker.it  This test is not yet approved or cleared by the [place] FDA and has been authorized for detection and/or diagnosis of SARS-CoV-2 by FDA under an Emergency Use Authorization (EUA). This EUA will remain in effect (meaning this test can be used) for the duration of the COVID-19 declaration under Section 564(b)(1) of the Act, 21 U.S.C. section 360bbb-3(b)(1), unless the authorization is terminated or revoked.  Performed at [facility], [address] [place], [place] [address]      Scheduled Meds:  heparin  5,000 Units Subcutaneous Q8H   insulin aspart  0-6 Units Subcutaneous Q4H   mometasone-formoterol  2 puff Inhalation BID   pantoprazole (PROTONIX) IV  40 mg Intravenous Q24H   sodium chloride flush  3 mL Intravenous Q12H   Continuous Infusions:  sodium chloride 125 mL/hr at 02/08/21 1010   acetaminophen     methocarbamol (ROBAXIN) IV 500 mg (02/08/21 1011)     LOS: 1 day   Lonia Blood, MD [facility] Office  [phone] Pager - Text Page per Loretha Stapler  If 7PM-7AM, please contact night-coverage per Amion 02/08/2021, 10:20 AM

## 2021-02-09 DIAGNOSIS — E669 Obesity, unspecified: Secondary | ICD-10-CM | POA: Diagnosis not present

## 2021-02-09 DIAGNOSIS — K436 Other and unspecified ventral hernia with obstruction, without gangrene: Secondary | ICD-10-CM | POA: Diagnosis not present

## 2021-02-09 DIAGNOSIS — N179 Acute kidney failure, unspecified: Secondary | ICD-10-CM | POA: Diagnosis not present

## 2021-02-09 LAB — BASIC METABOLIC PANEL
Anion gap: 13 (ref 5–15)
BUN: 19 mg/dL (ref 8–23)
CO2: 17 mmol/L — ABNORMAL LOW (ref 22–32)
Calcium: 9 mg/dL (ref 8.9–10.3)
Chloride: 114 mmol/L — ABNORMAL HIGH (ref 98–111)
Creatinine, Ser: 1.04 mg/dL — ABNORMAL HIGH (ref 0.44–1.00)
GFR, Estimated: 59 mL/min — ABNORMAL LOW (ref >60)
Glucose, Bld: 96 mg/dL (ref 70–99)
Potassium: 4.1 mmol/L (ref 3.5–5.1)
Sodium: 144 mmol/L (ref 135–145)

## 2021-02-09 LAB — CBC
HCT: 37.3 % (ref 36.0–46.0)
Hemoglobin: 11.7 g/dL — ABNORMAL LOW (ref 12.0–15.0)
MCH: 31.9 pg (ref 26.0–34.0)
MCHC: 31.4 g/dL (ref 30.0–36.0)
MCV: 101.6 fL — ABNORMAL HIGH (ref 80.0–100.0)
Platelets: 376 10*3/uL (ref 150–400)
RBC: 3.67 MIL/uL — ABNORMAL LOW (ref 3.87–5.11)
RDW: 14.2 % (ref 11.5–15.5)
WBC: 24.3 10*3/uL — ABNORMAL HIGH (ref 4.0–10.5)
nRBC: 0.5 % — ABNORMAL HIGH (ref 0.0–0.2)

## 2021-02-09 LAB — GLUCOSE, CAPILLARY
Glucose-Capillary: 93 mg/dL (ref 70–99)
Glucose-Capillary: 99 mg/dL (ref 70–99)
Glucose-Capillary: 101 mg/dL — ABNORMAL HIGH (ref 70–99)
Glucose-Capillary: 112 mg/dL — ABNORMAL HIGH (ref 70–99)
Glucose-Capillary: 118 mg/dL — ABNORMAL HIGH (ref 70–99)

## 2021-02-09 LAB — MAGNESIUM: Magnesium: 1.8 mg/dL (ref 1.7–2.4)

## 2021-02-09 LAB — MRSA NEXT GEN BY PCR, NASAL: MRSA by PCR Next Gen: NOT DETECTED

## 2021-02-09 MED ORDER — QUETIAPINE FUMARATE 25 MG PO TABS
12.5000 mg | ORAL_TABLET | Freq: Every day | ORAL | Status: DC
  Administered 2021-02-09 – 2021-02-26 (×18): 12.5 mg via ORAL
  Filled 2021-02-09 (×18): qty 1

## 2021-02-09 MED ORDER — LIP MEDEX EX OINT
TOPICAL_OINTMENT | CUTANEOUS | Status: AC
  Filled 2021-02-09: qty 7

## 2021-02-09 NOTE — Progress Notes (Signed)
3 Days Post-Op   Subjective/Chief Complaint: No complaints this morning She reports passing some flatus NG output still high   Objective: Vital signs in last 24 hours: Temp:  [97.8 F (36.6 C)-98 F (36.7 C)] 98 F (36.7 C) (11/03 0417) Pulse Rate:  [87-95] 95 (11/03 0417) Resp:  [20] 20 (11/03 0417) BP: (118-158)/(61-70) 158/66 (11/03 0417) SpO2:  [93 %-98 %] 97 % (11/03 0417) Last BM Date: 02/05/21  Intake/Output from previous day: 11/02 0701 - 11/03 0700 In: 2015.5 [I.V.:1865.5; IV Piggyback:150] Out: 2450 [Urine:750; Emesis/NG output:1700] Intake/Output this shift: No intake/output data recorded.  Exam: Awake and alert Abdomen softer, midline wound clean  Lab Results:  Recent Labs    02/08/21 0541 02/09/21 0443  WBC 21.9* 24.3*  HGB 12.0 11.7*  HCT 36.1 37.3  PLT 353 376   BMET Recent Labs    02/08/21 0541 02/09/21 0443  NA 141 144  K 4.7 4.1  CL 114* 114*  CO2 17* 17*  GLUCOSE 120* 96  BUN 34* 19  CREATININE 1.76* 1.04*  CALCIUM 8.4* 9.0   PT/INR No results for input(s): LABPROT, INR in the last 72 hours. ABG No results for input(s): PHART, HCO3 in the last 72 hours.  Invalid input(s): PCO2, PO2  Studies/Results: DG Abd Portable 1V  Result Date: 02/07/2021 CLINICAL DATA:  NG tube placement. EXAM: PORTABLE ABDOMEN - 1 VIEW COMPARISON:  CT abdomen and pelvis 02/06/2021. FINDINGS: Nasogastric tube tip proximal stomach. Side hole is at the level of the gastroesophageal junction. Distended small bowel loops are again noted. There are surgical clips and sutures in the right abdomen. Visualized lung bases are clear. IMPRESSION: 1. Enteric tube tip is in the proximal stomach with side hole at the level of the gastroesophageal junction. Recommend advancing tube. 2. Stable dilated small bowel concerning for small obstruction. Electronically Signed   By: Darliss Cheney M.D.   On: 02/07/2021 19:44    Anti-infectives: Anti-infectives (From admission, onward)     Start     Dose/Rate Route Frequency Ordered Stop   02/06/21 2003  ceFAZolin (ANCEF) 2-4 GM/100ML-% IVPB       Note to Pharmacy: Ponciano Ort   : cabinet override      02/06/21 2003 02/06/21 2046   02/06/21 1945  ceFAZolin (ANCEF) IVPB 2g/100 mL premix        2 g 200 mL/hr over 30 Minutes Intravenous On call to O.R. 02/06/21 1914 02/06/21 2028       Assessment/Plan: Ventral hernia with SBO POD#3 S/P ventral hernia repair with small bowel resection, LOA 10/31 Dr. Sheliah Hatch   Continue NPO and NG given high output, but she is at least passing flatus.  If she does not improve further will have to start TNA Will order a wound VAC starting tomorrow Follow WBC which has been going up.  Too early post op to CT abdomen   LOS: 2 days    Abigail Miyamoto MD 02/09/2021

## 2021-02-09 NOTE — Progress Notes (Signed)
Laurie [name]  [id number] DOB: 1956-03-25 DOA: 02/06/2021 PCP: Cindra Eves, MD    Brief [id number] w/ a hx of DM2, HTN, HLD, anxiety/depression, GERD, obesity, and multiple abdomino-pelvic surgeries who presented to the ED with 5 days of worsening lower abdominal pain associated with loose stools and vomiting. In the ED CT revealed an incarcerated ventral hernia. EDP attempted reduction though symptoms did not improve. Labs notable for acute renal failure in the setting of dehydration. She was transferred to [facility] ED for surgical consult, repeat CT abdomen showed persistent hernia with bowel obstruction for which she was taken to the OR.  Consultants:  General Surgery  Code Status: FULL CODE  Antimicrobials:  None  DVT prophylaxis: Heparin  Subjective: Afebrile.  Blood pressure modestly elevated.  Saturations stable.  WBC elevated.  Alert and conversant but mildly confused today.  Reports ongoing low-grade abdominal pain.  Denies chest pain or shortness of breath.  Assessment & Plan:  Incarcerated ventral hernia with SBO Status post repair with small bowel resection and LOA - ongoing care per General Surgery - NG tube remains in place  Mild confusion Likely a component of sundowning/acute delirium with possible component of baseline cognitive deficit -initiate trial of low-dose nightly Seroquel -monitor  Acute renal failure Worsened initially postop -baseline creatinine approximately 1.7 -creatinine dramatically improved 11/2 and remains stable  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 Terance Hart at [facility] - history of cystectomy and right colon pouch for interstitial cystitis 1990s - pouch stone is found to be nonobstructing  DM2 CBG well controlled  HTN Monitor blood pressure trend without adjustment in treatment today  COPD Continue usual Dulera and as needed albuterol -no acute exacerbation presently  GERD Continue  usual home medical therapy  Depression Continue usual home medical therapy  HLD Statin on hold until oral intake more consistent  Obesity - Body mass index is 34.33 kg/m.   Family Communication: No family present at time of exam today Status is: Inpatient   Objective: Blood pressure (!) 158/66, pulse 95, temperature 98 F (36.7 C), temperature source Oral, resp. rate 20, height 5\' 4"  (1.626 m), weight 90.7 kg, SpO2 98 %.  Intake/Output Summary (Last 24 hours) at 02/09/2021 0839 Last data filed at 02/09/2021 0618 Gross per 24 hour  Intake 2015.5 ml  Output 2450 ml  Net -434.5 ml    Filed Weights   02/06/21 1053  Weight: 90.7 kg    Examination: General: No acute respiratory distress Lungs: Clear to auscultation bilaterally -no wheeze Cardiovascular: RRR without murmur or rub Abdomen: Dressings intact and dry, mildly tender diffusely, bowel sounds present, no mass Extremities: No significant cyanosis, clubbing, or edema bilateral lower extremities  CBC: Recent Labs  Lab 02/06/21 1155 02/07/21 0546 02/08/21 0541 02/09/21 0443  WBC 11.1* 20.4* 21.9* 24.3*  NEUTROABS 7.9*  --   --   --   HGB 13.8 11.7* 12.0 11.7*  HCT 40.9 35.8* 36.1 37.3  MCV 94.5 98.9 99.2 101.6*  PLT 507* 396 353 [id number]    Basic Metabolic Panel: Recent Labs  Lab 02/07/21 0546 02/08/21 0541 02/09/21 0443  NA 138 141 144  K 4.6 4.7 4.1  CL 106 114* 114*  CO2 20* 17* 17*  GLUCOSE 189* 120* 96  BUN 43* 34* 19  CREATININE 4.81* 1.76* 1.04*  CALCIUM 8.0* 8.4* 9.0  MG  --   --  1.8    GFR: Estimated Creatinine Clearance: 58 mL/min (A) (by C-G  formula based on SCr of 1.04 mg/dL (H)).  Liver Function Tests: Recent Labs  Lab 02/06/21 1315 02/07/21 0546  AST 22 25  ALT 18 18  ALKPHOS 58 55  BILITOT 0.4 0.7  PROT 8.0 6.7  ALBUMIN 4.1 3.3*    Recent Labs  Lab 02/06/21 1315  LIPASE 34     HbA1C: Hgb A1c MFr Bld  Date/Time Value Ref Range Status  02/07/2021 05:46 AM 6.2 (H)  4.8 - 5.6 % Final    Comment:    (NOTE) Pre diabetes:          5.7%-6.4%  Diabetes:              >6.4%  Glycemic control for   <7.0% adults with diabetes   06/25/2015 05:06 AM 6.2 (H) 4.8 - 5.6 % Final    Comment:    (NOTE)         Pre-diabetes: 5.7 - 6.4         Diabetes: >6.4         Glycemic control for adults with diabetes: <7.0     CBG: Recent Labs  Lab 02/08/21 1630 02/08/21 2039 02/08/21 2309 02/09/21 0419 02/09/21 0808  GLUCAP 123* 107* 119* 93 99     Recent Results (from the past 240 hour(s))  Resp Panel by RT-PCR (Flu A&B, Covid) Nasopharyngeal Swab     Status: None   Collection Time: 02/06/21 11:55 AM   Specimen: Nasopharyngeal Swab; Nasopharyngeal(NP) swabs in vial transport medium  Result Value Ref Range Status   SARS Coronavirus 2 by RT PCR NEGATIVE NEGATIVE Final    Comment: (NOTE) SARS-CoV-2 target nucleic acids are NOT DETECTED.  The SARS-CoV-2 RNA is generally detectable in upper respiratory specimens during the acute phase of infection. The lowest concentration of SARS-CoV-2 viral copies this assay can detect is 138 copies/mL. A negative result does not preclude SARS-Cov-2 infection and should not be used as the sole basis for treatment or other patient management decisions. A negative result may occur with  improper specimen collection/handling, submission of specimen other than nasopharyngeal swab, presence of viral mutation(s) within the areas targeted by this assay, and inadequate number of viral copies(<138 copies/mL). A negative result must be combined with clinical observations, patient history, and epidemiological information. The expected result is Negative.  Fact Sheet for Patients:  EntrepreneurPulse.com.au  Fact Sheet for Healthcare Providers:  IncredibleEmployment.be  This test is no t yet approved or cleared by the [place] FDA and  has been authorized for detection and/or diagnosis of  SARS-CoV-2 by FDA under an Emergency Use Authorization (EUA). This EUA will remain  in effect (meaning this test can be used) for the duration of the COVID-19 declaration under Section 564(b)(1) of the Act, 21 U.S.C.section 360bbb-3(b)(1), unless the authorization is terminated  or revoked sooner.       Influenza A by PCR NEGATIVE NEGATIVE Final   Influenza B by PCR NEGATIVE NEGATIVE Final    Comment: (NOTE) The Xpert Xpress SARS-CoV-2/FLU/RSV plus assay is intended as an aid in the diagnosis of influenza from Nasopharyngeal swab specimens and should not be used as a sole basis for treatment. Nasal washings and aspirates are unacceptable for Xpert Xpress SARS-CoV-2/FLU/RSV testing.  Fact Sheet for Patients: EntrepreneurPulse.com.au  Fact Sheet for Healthcare Providers: IncredibleEmployment.be  This test is not yet approved or cleared by the [place] FDA and has been authorized for detection and/or diagnosis of SARS-CoV-2 by FDA under an Emergency Use Authorization (EUA). This EUA will  remain in effect (meaning this test can be used) for the duration of the COVID-19 declaration under Section 564(b)(1) of the Act, 21 U.S.C. section 360bbb-3(b)(1), unless the authorization is terminated or revoked.  Performed at [facility], [address], [place], [place] [address]       Scheduled Meds:  heparin  5,000 Units Subcutaneous Q8H   insulin aspart  0-6 Units Subcutaneous Q4H   mometasone-formoterol  2 puff Inhalation BID   pantoprazole (PROTONIX) IV  40 mg Intravenous Q24H   sodium chloride flush  3 mL Intravenous Q12H   Continuous Infusions:  sodium chloride 75 mL/hr at 02/09/21 0200   methocarbamol (ROBAXIN) IV 500 mg (02/09/21 0513)     LOS: 2 days   Lonia Blood, MD [facility] Office  [phone] Pager - Text Page per Loretha Stapler  If 7PM-7AM, please contact night-coverage per Amion 02/09/2021,  8:39 AM

## 2021-02-09 NOTE — Progress Notes (Signed)
Mobility Specialist - Progress Note    02/09/21 1408  Mobility  Activity Ambulated in hall  Level of Assistance Minimal assist, patient does 75% or more  Assistive Device Front wheel walker  Distance Ambulated (ft) 75 ft  Mobility Ambulated with assistance in hallway  Mobility Response Tolerated well  Mobility performed by Mobility specialist;Family member  $Mobility charge 1 Mobility   Upon entry pt was nervous about ambulating, but ultimately agreed. Pt was disconnected from NGT and required Min A to stand from bed. Pt used RW to ambulate ~75 ft in hallway and reported 9/10 pain in her abdominal area, but was determined to ambulate. Pt returned to room after session and was left in recliner, reconnected to NGT, and left with call bell at side and family in room.   Arliss Journey Mobility Specialist [facility] Phone: [phone] 02/09/21, 2:11 PM

## 2021-02-10 ENCOUNTER — Inpatient Hospital Stay (HOSPITAL_COMMUNITY): Payer: Medicare PPO

## 2021-02-10 DIAGNOSIS — E669 Obesity, unspecified: Secondary | ICD-10-CM | POA: Diagnosis not present

## 2021-02-10 DIAGNOSIS — N179 Acute kidney failure, unspecified: Secondary | ICD-10-CM | POA: Diagnosis not present

## 2021-02-10 DIAGNOSIS — K436 Other and unspecified ventral hernia with obstruction, without gangrene: Secondary | ICD-10-CM | POA: Diagnosis not present

## 2021-02-10 LAB — BASIC METABOLIC PANEL WITH GFR
Anion gap: 14 (ref 5–15)
BUN: 18 mg/dL (ref 8–23)
CO2: 15 mmol/L — ABNORMAL LOW (ref 22–32)
Calcium: 9.1 mg/dL (ref 8.9–10.3)
Chloride: 117 mmol/L — ABNORMAL HIGH (ref 98–111)
Creatinine, Ser: 1.15 mg/dL — ABNORMAL HIGH (ref 0.44–1.00)
GFR, Estimated: 53 mL/min — ABNORMAL LOW (ref >60)
Glucose, Bld: 144 mg/dL — ABNORMAL HIGH (ref 70–99)
Potassium: 3.6 mmol/L (ref 3.5–5.1)
Sodium: 146 mmol/L — ABNORMAL HIGH (ref 135–145)

## 2021-02-10 LAB — GLUCOSE, CAPILLARY
Glucose-Capillary: 137 mg/dL — ABNORMAL HIGH (ref 70–99)
Glucose-Capillary: 139 mg/dL — ABNORMAL HIGH (ref 70–99)
Glucose-Capillary: 144 mg/dL — ABNORMAL HIGH (ref 70–99)
Glucose-Capillary: 145 mg/dL — ABNORMAL HIGH (ref 70–99)
Glucose-Capillary: 149 mg/dL — ABNORMAL HIGH (ref 70–99)
Glucose-Capillary: 162 mg/dL — ABNORMAL HIGH (ref 70–99)

## 2021-02-10 LAB — VITAMIN B12: Vitamin B-12: 3927 pg/mL — ABNORMAL HIGH (ref 180–914)

## 2021-02-10 LAB — CBC
HCT: 35.2 % — ABNORMAL LOW (ref 36.0–46.0)
Hemoglobin: 11.7 g/dL — ABNORMAL LOW (ref 12.0–15.0)
MCH: 32.9 pg (ref 26.0–34.0)
MCHC: 33.2 g/dL (ref 30.0–36.0)
MCV: 98.9 fL (ref 80.0–100.0)
Platelets: 435 10*3/uL — ABNORMAL HIGH (ref 150–400)
RBC: 3.56 MIL/uL — ABNORMAL LOW (ref 3.87–5.11)
RDW: 14.5 % (ref 11.5–15.5)
WBC: 27.3 10*3/uL — ABNORMAL HIGH (ref 4.0–10.5)
nRBC: 0.3 % — ABNORMAL HIGH (ref 0.0–0.2)

## 2021-02-10 LAB — FOLATE: Folate: 6.5 ng/mL (ref >5.9)

## 2021-02-10 LAB — TSH: TSH: 0.621 u[IU]/mL (ref 0.350–4.500)

## 2021-02-10 LAB — AMMONIA: Ammonia: 28 umol/L (ref 9–35)

## 2021-02-10 IMAGING — CT CT ABD-PELV W/ CM
2 of 5 series · 14 of 46 positions shown, 16 images · IV contrast (OMNIPAQUE)
Comparison: CT abdomen and pelvis [DATE]

CLINICAL DATA: Postoperative umbilical hernia repair. Abdominal
pain and fever with leukocytosis.

EXAM:
CT ABDOMEN AND PELVIS WITH CONTRAST
TECHNIQUE: Multidetector CT imaging of the abdomen and pelvis was performed
using the standard protocol following bolus administration of
intravenous contrast.
CONTRAST:  80mL OMNIPAQUE IOHEXOL 350 MG/ML SOLN

[Series 2: axial st · axial · 0.90mm/px · z∈[-411,-11]mm · 11 of 96 slices shown, 13 images]
[im 8/96  soft-tissue]
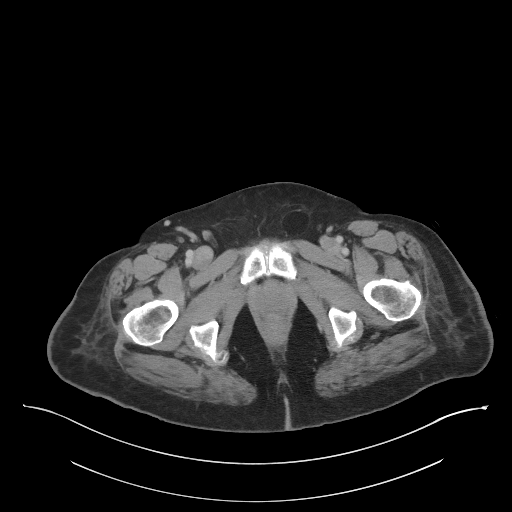
[im 8/96  bone]
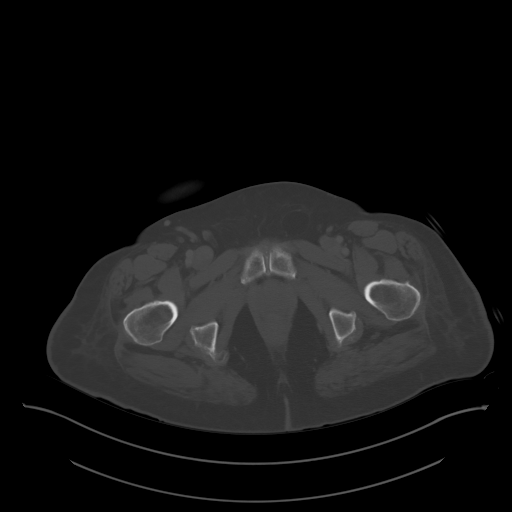
[im 15/96  soft-tissue]
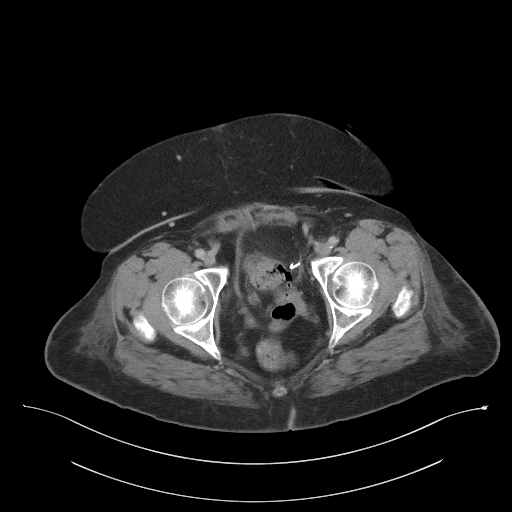
[im 22/96  soft-tissue]
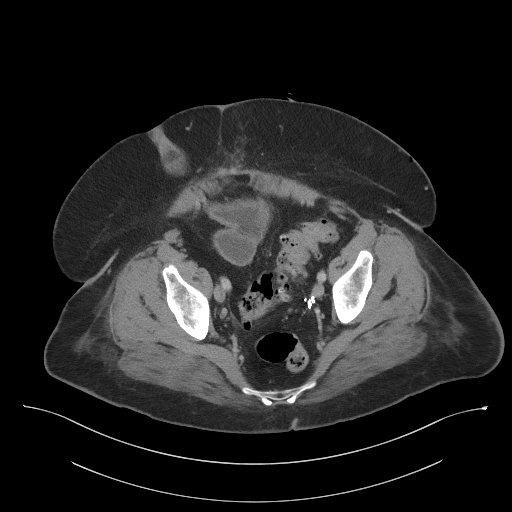
[im 30/96  soft-tissue]
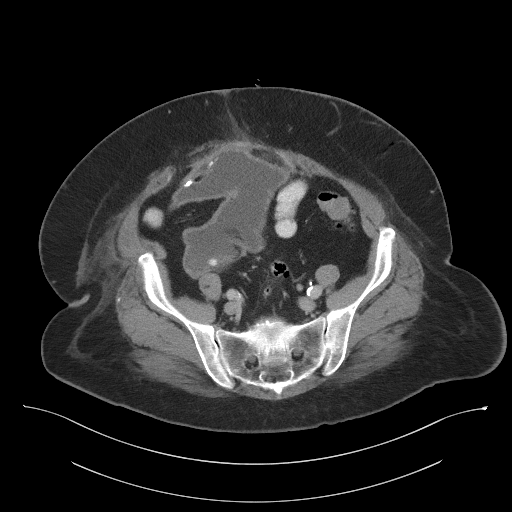
[im 37/96  soft-tissue]
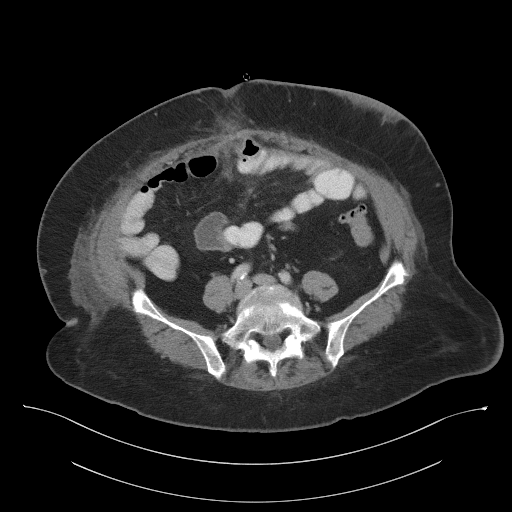
[im 52/96  soft-tissue]
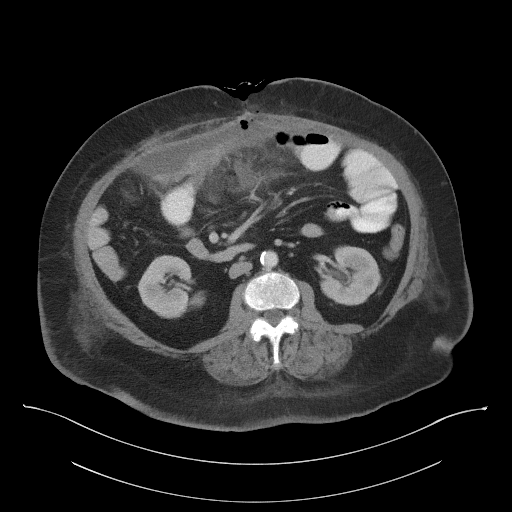
[im 59/96  soft-tissue]
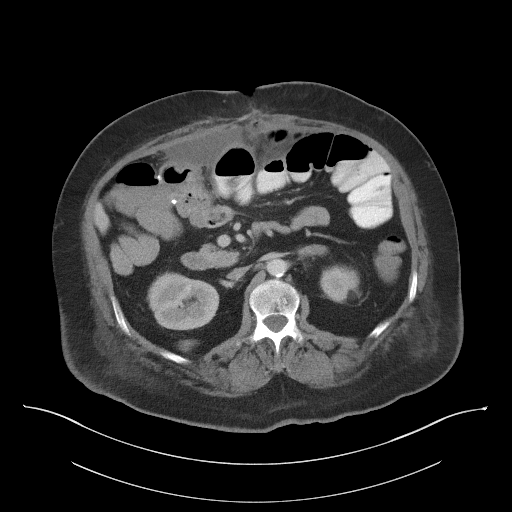
[im 66/96  soft-tissue]
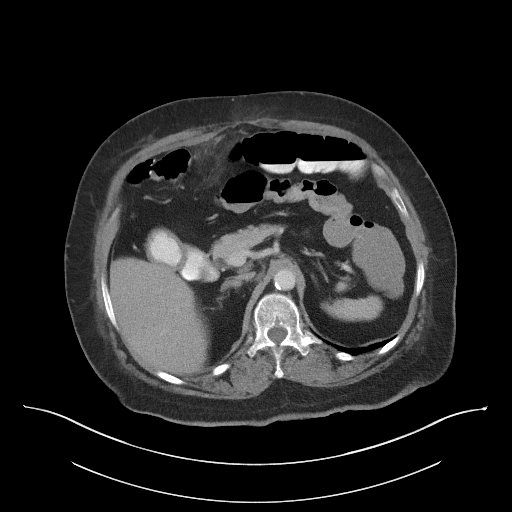
[im 74/96  soft-tissue]
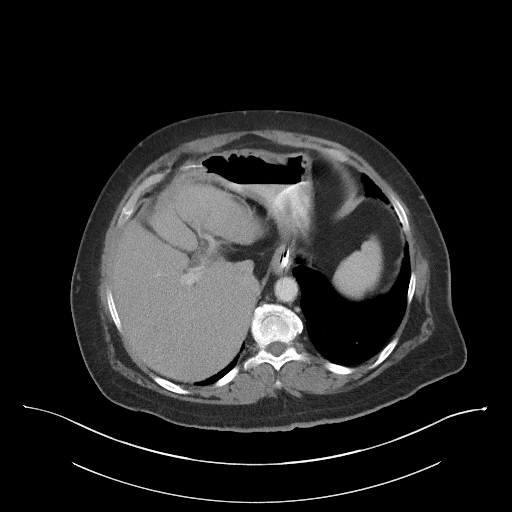
[im 74/96  bone]
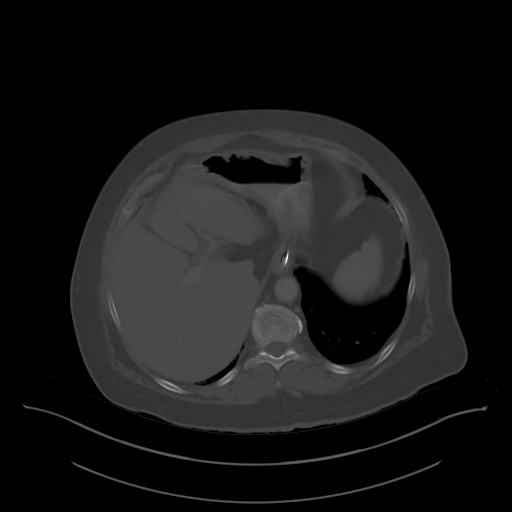
[im 81/96  soft-tissue]
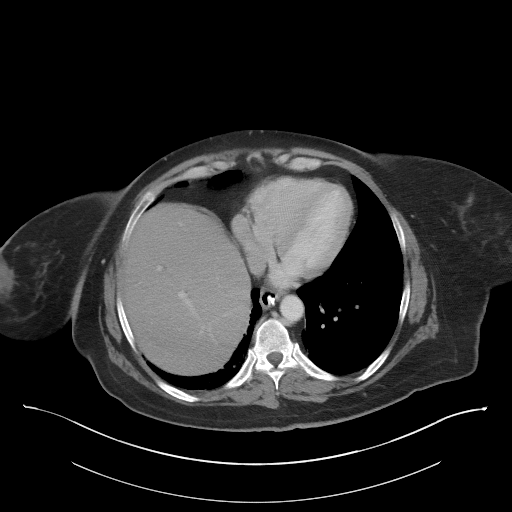
[im 88/96  soft-tissue]
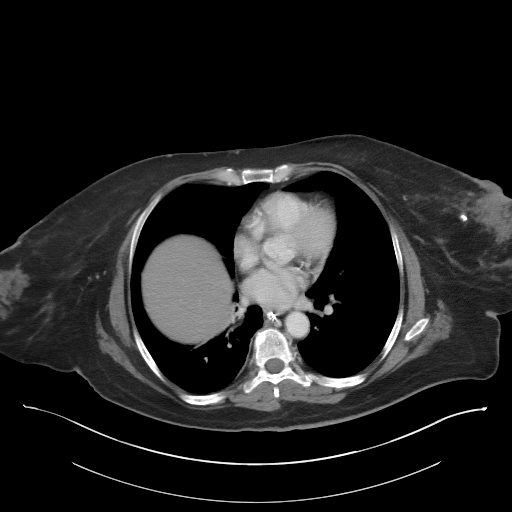

[Series 6: coronal st · coronal · 0.68mm/px · 3 of 115 slices shown]
[im 39/115  soft-tissue]
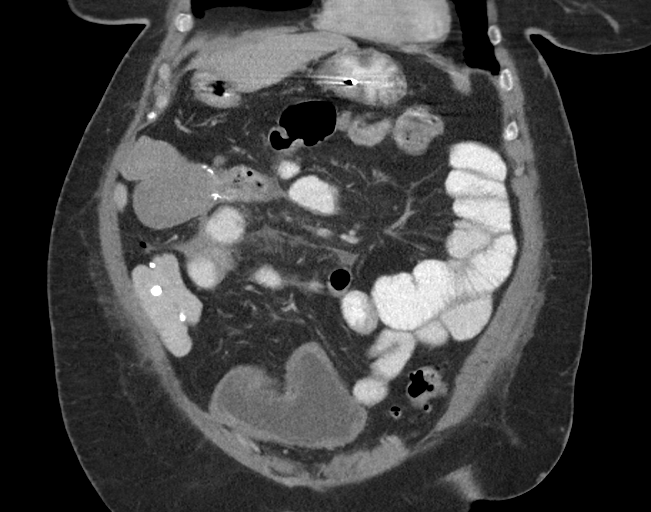
[im 51/115  soft-tissue]
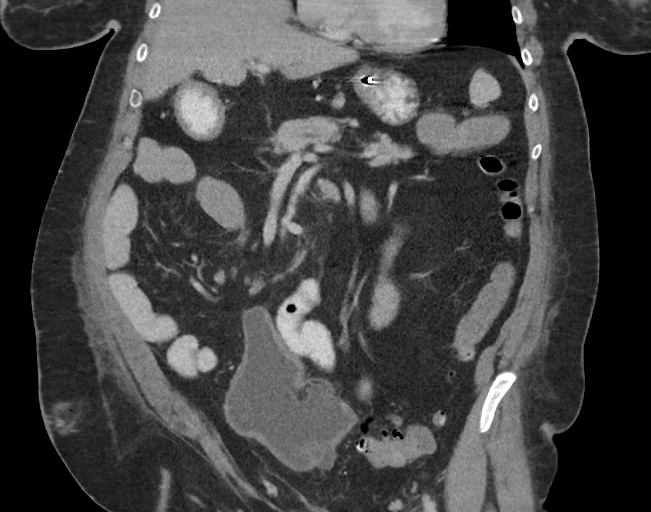
[im 64/115  soft-tissue]
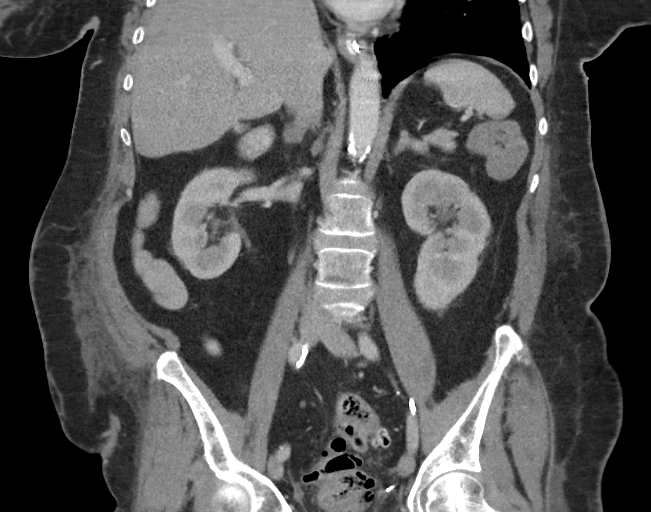

[14 of 46 positions shown; findings below may reference images not displayed]

FINDINGS: Lower chest: There is minimal atelectasis in the right lung base.

Hepatobiliary: No focal liver abnormality is seen. Status post
cholecystectomy. No biliary dilatation.

Pancreas: Unremarkable. No pancreatic ductal dilatation or
surrounding inflammatory changes.

Spleen: Normal in size without focal abnormality.

Adrenals/Urinary Tract: There is bilateral adrenal thickening
similar to the prior examination. The bilateral kidneys are within
normal limits. Patient is status post cystectomy. There is a right
lower quadrant urinary diversion which is distended with fluid.
There is a stable 14 mm calcification within the urinary diversion.

Stomach/Bowel: Partial right colectomy changes are again seen. There
is sigmoid colon diverticulosis without evidence for diverticulitis.

There are mildly dilated loops of small bowel proximally, but distal
small bowel loops appear nondilated. Oral contrast is seen to reach
distal small bowel.

Patient is status post umbilical hernia repair. There is an abnormal
loop of small bowel at the level of the umbilicus demonstrating wall
thickening with a portion of this bowel approximating the anterior
abdominal wall (images 2/48 through 54). There is no pneumatosis.
There is mesenteric edema and fluid adjacent to small bowel loops at
the surgical level.

Nasogastric tube tip is in the distal stomach. The stomach is
nondilated.

Vascular/Lymphatic: Aortic atherosclerosis. No enlarged abdominal or
pelvic lymph nodes.

Reproductive: Status post hysterectomy. No adnexal masses.

Other: Patient is status post umbilical hernia repair. There is no
evidence for recurrent hernia. There is a small amount of
subcutaneous fluid and air at the level of the umbilicus compatible
with recent surgery. There is also some skin thickening and edema of
the left anterior abdominal wall, nonspecific. Subcutaneous air in
the left abdominal wall may be related to medication injection
sites.

There are intraperitoneal fluid pockets containing a small amount of
air approximating small-bowel loops in the anterior abdominal wall.
The largest pocket is seen along the anterior and midline abdominal
wall at the level of the umbilicus measuring 11 x 21 by 10 cm. There
is no definitive wall enhancement. There also a few small foci of
free intraperitoneal air in the anterior upper abdomen.

Musculoskeletal: No acute or significant osseous findings.
IMPRESSION: 1. Status post umbilical hernia repair. There is a small amount of
free air in the upper abdomen.
2. Short segment small bowel wall thickening with associated
mesenteric edema in the anterior abdomen at the surgical level
worrisome for nonspecific enteritis.
3. Intraperitoneal fluid collection containing air along the
anterior abdominal wall approximating inflamed small bowel loops.
Differential diagnosis includes abscess or other postoperative fluid
collection. Small bowel perforation or fistula cannot be excluded
given that there are inflamed small bowel loops at this level.
4. Nasogastric tube tip in the distal stomach.
5. Urinary diversion appears uncomplicated. No hydronephrosis.
Stable calculus in the urinary diversion.
6. Sigmoid colon diverticulosis.
7.  Aortic Atherosclerosis ([KN]-[KN]).

## 2021-02-10 MED ORDER — KCL IN DEXTROSE-NACL 20-5-0.45 MEQ/L-%-% IV SOLN
INTRAVENOUS | Status: DC
  Filled 2021-02-10 (×5): qty 1000

## 2021-02-10 MED ORDER — IOHEXOL 350 MG/ML SOLN
80.0000 mL | Freq: Once | INTRAVENOUS | Status: AC | PRN
  Administered 2021-02-10: 80 mL via INTRAVENOUS

## 2021-02-10 MED ORDER — IOHEXOL 9 MG/ML PO SOLN
500.0000 mL | ORAL | Status: AC
  Administered 2021-02-10: 500 mL via ORAL

## 2021-02-10 MED ORDER — DEXTROSE-NACL 5-0.45 % IV SOLN: INTRAVENOUS | Status: DC

## 2021-02-10 MED ORDER — HEPARIN SODIUM (PORCINE) 5000 UNIT/ML IJ SOLN
5000.0000 [IU] | Freq: Three times a day (TID) | INTRAMUSCULAR | Status: DC
  Administered 2021-02-11 – 2021-02-13 (×5): 5000 [IU] via SUBCUTANEOUS
  Filled 2021-02-10 (×5): qty 1

## 2021-02-10 MED ORDER — METOPROLOL TARTRATE 5 MG/5ML IV SOLN
5.0000 mg | Freq: Three times a day (TID) | INTRAVENOUS | Status: DC
  Administered 2021-02-10 – 2021-02-11 (×3): 5 mg via INTRAVENOUS
  Filled 2021-02-10 (×3): qty 5

## 2021-02-10 MED ORDER — IOHEXOL 9 MG/ML PO SOLN
ORAL | Status: AC
  Administered 2021-02-10: 500 mL via ORAL
  Filled 2021-02-10: qty 1000

## 2021-02-10 NOTE — Care Management Important Message (Signed)
Important Message  Patient Details IM Letter given to the Patient. Name: Samanda Buske MRN: [id number] Date of Birth: 1956/04/05   Medicare Important Message Given:  Yes     [name] 02/10/2021, 1:39 PM

## 2021-02-10 NOTE — Progress Notes (Signed)
Physical Therapy Treatment Patient Details Name: Laurie [name] MRN: [id number] DOB: Jan 09, 1956 Today's Date: 02/10/2021   History of Present Illness Laurie [name] is a 65 y.o. female with a history of T2DM, HTN, HLD, anxiety/depression, GERD, obesity and multiple abdominopelvic surgeries who presented to the ED 02/06/21 with abdominal pain , CT revealed an incarcerated ventral hernia. S/P small bowell resection, hernia repair and lysis of adhesions 02/06/21.    PT Comments    Pt is progressing well with activity tolerance. She ambulated 180' with RW, no loss of balance. Mod assist for bed mobility. All mobility is slow and labored 2* abdominal pain.    Recommendations for follow up therapy are one component of a multi-disciplinary discharge planning process, led by the attending physician.  Recommendations may be updated based on patient status, additional functional criteria and insurance authorization.  Follow Up Recommendations  Home health PT     Assistance Recommended at Discharge    Equipment Recommendations  None recommended by PT    Recommendations for Other Services       Precautions / Restrictions Precautions Precautions: Other (comment) Precaution Comments: abdomen, NG suction, ice chips only Restrictions Weight Bearing Restrictions: No     Mobility  Bed Mobility Overal bed mobility: Needs Assistance Bed Mobility: Supine to Sit     Supine to sit: HOB elevated;Mod assist     General bed mobility comments: min assist and increased time to transfer to side of bed requiring hand holds to pull herself forward.    Transfers Overall transfer level: Needs assistance Equipment used: Rolling walker (2 wheels)   Sit to Stand: Min guard;From elevated surface           General transfer comment: VCs hand placement    Ambulation/Gait Ambulation/Gait assistance: Min guard Gait Distance (Feet): 180 Feet Assistive device: Rolling walker (2  wheels) Gait Pattern/deviations: Step-to pattern;Decreased stride length;Narrow base of support Gait velocity: decreased   General Gait Details: slow but steady, no loss of balance   Stairs             Wheelchair Mobility    Modified Rankin (Stroke Patients Only)       Balance Overall balance assessment: Needs assistance Sitting-balance support: Single extremity supported Sitting balance-Leahy Scale: Good     Standing balance support: During functional activity;Bilateral upper extremity supported Standing balance-Leahy Scale: Fair Standing balance comment: reliant on UE support                            Cognition Arousal/Alertness: Awake/alert Behavior During Therapy: WFL for tasks assessed/performed Overall Cognitive Status: Within Functional Limits for tasks assessed                                          Exercises      General Comments        Pertinent Vitals/Pain Pain Score: 9  Pain Location: abdomen Pain Descriptors / Indicators: Grimacing;Guarding Pain Intervention(s): Patient requesting pain meds-RN notified;Limited activity within patient's tolerance;Monitored during session;Repositioned    Home Living                          Prior Function            PT Goals (current goals can now be found in the care plan section) Acute Rehab  PT Goals Patient Stated Goal: eat and drink, go home PT Goal Formulation: With patient/family Time For Goal Achievement: 02/21/21 Potential to Achieve Goals: Good Progress towards PT goals: Progressing toward goals    Frequency    Min 3X/week      PT Plan Current plan remains appropriate    Co-evaluation              AM-PAC PT "6 Clicks" Mobility   Outcome Measure  Help needed turning from your back to your side while in a flat bed without using bedrails?: A Lot Help needed moving from lying on your back to sitting on the side of a flat bed without using  bedrails?: A Lot Help needed moving to and from a bed to a chair (including a wheelchair)?: A Little Help needed standing up from a chair using your arms (e.g., wheelchair or bedside chair)?: A Little Help needed to walk in hospital room?: A Little Help needed climbing 3-5 steps with a railing? : A Little 6 Click Score: 16    End of Session   Activity Tolerance: Patient tolerated treatment well;No increased pain Patient left: in chair;with call bell/phone within reach;with family/visitor present;with chair alarm set Nurse Communication: Mobility status PT Visit Diagnosis: Unsteadiness on feet (R26.81);Pain     Time: 5643-3295 PT Time Calculation (min) (ACUTE ONLY): 26 min  Charges:  $Gait Training: 8-22 mins $Therapeutic Activity: 8-22 mins                    Ralene Bathe Kistler PT 02/10/2021  Acute Rehabilitation Services Pager [phone] Office [phone]

## 2021-02-10 NOTE — Progress Notes (Signed)
Progress Note  4 Days Post-Op  Subjective: NGT output 800cc yesterday.  Patient states she thinks her abdominal pain has worsened starting yesterday.  Denies any flatus or stool.  She did ambulate in the halls yesterday with min A.  Objective: Vital signs in last 24 hours: Temp:  [99 F (37.2 C)-100 F (37.8 C)] 100 F (37.8 C) (11/04 0536) Pulse Rate:  [100-110] 110 (11/04 0536) Resp:  [16-20] 20 (11/04 0536) BP: (153-173)/(67-81) 157/81 (11/04 0536) SpO2:  [95 %-97 %] 95 % (11/04 0755) Last BM Date: 02/05/21  Intake/Output from previous day: 11/03 0701 - 11/04 0700 In: -  Out: 4850 [Urine:4050; Emesis/NG output:800] Intake/Output this shift: No intake/output data recorded.  PE: General: pleasant, WD, obese female who is laying in bed in NAD Heart: regular, rate, and rhythm.   Lungs:  Respiratory effort nonlabored Abd: soft, seems appropriately ttp for post op, mild distention, BS hypoactive, NGT with bilious drainage, midline wound clean with some fibrinous tissue in wound base Psych: A&Ox3 with an appropriate affect.    Lab Results:  Recent Labs    02/09/21 0443 02/10/21 0444  WBC 24.3* 27.3*  HGB 11.7* 11.7*  HCT 37.3 35.2*  PLT 376 435*   BMET Recent Labs    02/09/21 0443 02/10/21 0444  NA 144 146*  K 4.1 3.6  CL 114* 117*  CO2 17* 15*  GLUCOSE 96 144*  BUN 19 18  CREATININE 1.04* 1.15*  CALCIUM 9.0 9.1   PT/INR No results for input(s): LABPROT, INR in the last 72 hours. CMP     Component Value Date/Time   NA 146 (H) 02/10/2021 0444   K 3.6 02/10/2021 0444   CL 117 (H) 02/10/2021 0444   CO2 15 (L) 02/10/2021 0444   GLUCOSE 144 (H) 02/10/2021 0444   BUN 18 02/10/2021 0444   CREATININE 1.15 (H) 02/10/2021 0444   CALCIUM 9.1 02/10/2021 0444   PROT 6.7 02/07/2021 0546   ALBUMIN 3.3 (L) 02/07/2021 0546   AST 25 02/07/2021 0546   ALT 18 02/07/2021 0546   ALKPHOS 55 02/07/2021 0546   BILITOT 0.7 02/07/2021 0546   GFRNONAA 53 (L) 02/10/2021  0444   GFRAA 33 (L) 07/01/2015 0352   Lipase     Component Value Date/Time   LIPASE 34 02/06/2021 1315       Studies/Results: No results found.  Anti-infectives: Anti-infectives (From admission, onward)    Start     Dose/Rate Route Frequency Ordered Stop   02/06/21 2003  ceFAZolin (ANCEF) 2-4 GM/100ML-% IVPB       Note to Pharmacy: Ponciano Ort   : cabinet override      02/06/21 2003 02/06/21 2046   02/06/21 1945  ceFAZolin (ANCEF) IVPB 2g/100 mL premix        2 g 200 mL/hr over 30 Minutes Intravenous On call to O.R. 02/06/21 1914 02/06/21 2028        Assessment/Plan Ventral hernia with SBO POD#4 S/P ventral hernia repair with small bowel resection, LOA 10/31 Dr. Sheliah Hatch - continue NGT to LIWS until pt starts having some bowel function - PT/OT, encourage mobilization - IV tylenol and prn dilaudid for pain control, add scheduled IV robaxin  - will have WOC place VAC today to midline - ok to have ice chips for comfort  - if no bowel function in the next few days will need to consider TPN, but ok to hold off for now  -WBC up to 27K today.  UA 2 days  ago essentially negative.  Patient doesn't appear toxic, but the frank increase in WBC over the last several days is concerning.  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

## 2021-02-10 NOTE — Progress Notes (Signed)
PT Cancellation Note  Patient Details Name: Laurie [name] MRN: [id number] DOB: [birth year]   Cancelled Treatment:    Reason Eval/Treat Not Completed: Patient at procedure or test/unavailable (Pt is at CT. Will follow.)  Tamala Ser PT 02/10/2021  Acute Rehabilitation Services Pager [phone] Office [phone]

## 2021-02-10 NOTE — Progress Notes (Signed)
Laurie [name]  HCW:[id number] DOB: 1955/10/05 DOA: 02/06/2021 PCP: Margit Hanks, MD    Brief Narrative:  16OH w/ a hx of DM2, HTN, HLD, anxiety/depression, GERD, obesity, and multiple abdomino-pelvic surgeries who presented to the ED with 5 days of worsening lower abdominal pain associated with loose stools and vomiting. In the ED CT revealed an incarcerated ventral hernia. EDP attempted reduction though symptoms did not improve. Labs notable for acute renal failure in the setting of dehydration. She was transferred to [facility] ED for surgical consult, repeat CT abdomen showed persistent hernia with bowel obstruction for which she was taken to the OR.  Consultants:  General Surgery  Code Status: FULL CODE  Antimicrobials:  None  DVT prophylaxis: Heparin  Subjective: No acute events reported overnight.  Afebrile.  Mild tachycardia at 110.  Saturations 95% on room air.  WBC climbing and presently at 27.  Reports ongoing diffuse abdominal pain.  Despises her NG tube and asked that it be removed as soon as possible.  States she is passing a small amount of flatus.  Assessment & Plan:  Incarcerated ventral hernia with SBO Status post repair with small bowel resection and LOA - ongoing care per General Surgery - NG tube remains in place -persisting abdominal discomfort and climbing WBC concerning therefore CT abdomen pelvis being pursued by Surgery  Mild confusion Likely a component of sundowning/acute delirium with possible component of baseline cognitive deficit -initiated trial of low-dose nightly Seroquel - monitor  Mild hypernatremia Still not able to advance diet -increase free water via IV  Acute renal failure Worsened initially postop -baseline creatinine approximately 1.7 -creatinine dramatically improved 11/2 and remains stable at present  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 Vonita Moss at [facility] - history of cystectomy and right colon  pouch for interstitial cystitis 1990s - pouch stone is found to be nonobstructing  DM2 CBG well controlled  HTN Monitor blood pressure trend without change today  COPD Continue usual Dulera and as needed albuterol -no acute exacerbation presently  GERD Continue usual home medical therapy  Depression Continue usual home medical therapy  HLD Statin on hold until oral intake more consistent  Obesity - Body mass index is 34.33 kg/m.   Family Communication: Spoke with husband at bedside Status is: Inpatient   Objective: Blood pressure (!) 157/81, pulse (!) 110, temperature 100 F (37.8 C), temperature source Oral, resp. rate 20, height 5\' 4"  (1.626 m), weight 90.7 kg, SpO2 95 %.  Intake/Output Summary (Last 24 hours) at 02/10/2021 0843 Last data filed at 02/10/2021 0511 Gross per 24 hour  Intake --  Output 4850 ml  Net -4850 ml    Filed Weights   02/06/21 1053  Weight: 90.7 kg    Examination: General: No acute respiratory distress Lungs: Clear to auscultation bilaterally without wheezing or crackles Cardiovascular: RRR without murmur or rub Abdomen: Dressings intact and dry, mildly tender diffusely without appreciable change, bowel sounds hypoactive, no mass Extremities: No significant cyanosis, clubbing, or edema bilateral lower extremities  CBC: Recent Labs  Lab 02/06/21 1155 02/07/21 0546 02/08/21 0541 02/09/21 0443 02/10/21 0444  WBC 11.1*   < > 21.9* 24.3* 27.3*  NEUTROABS 7.9*  --   --   --   --   HGB 13.8   < > 12.0 11.7* 11.7*  HCT 40.9   < > 36.1 37.3 35.2*  MCV 94.5   < > 99.2 101.6* 98.9  PLT 507*   < > 353  376 435*   < > = values in this interval not displayed.    Basic Metabolic Panel: Recent Labs  Lab 02/08/21 0541 02/09/21 0443 02/10/21 0444  NA 141 144 146*  K 4.7 4.1 3.6  CL 114* 114* 117*  CO2 17* 17* 15*  GLUCOSE 120* 96 144*  BUN 34* 19 18  CREATININE 1.76* 1.04* 1.15*  CALCIUM 8.4* 9.0 9.1  MG  --  1.8  --      GFR: Estimated Creatinine Clearance: 52.5 mL/min (A) (by C-G formula based on SCr of 1.15 mg/dL (H)).  Liver Function Tests: Recent Labs  Lab 02/06/21 1315 02/07/21 0546  AST 22 25  ALT 18 18  ALKPHOS 58 55  BILITOT 0.4 0.7  PROT 8.0 6.7  ALBUMIN 4.1 3.3*    Recent Labs  Lab 02/06/21 1315  LIPASE 34     HbA1C: Hgb A1c MFr Bld  Date/Time Value Ref Range Status  02/07/2021 05:46 AM 6.2 (H) 4.8 - 5.6 % Final    Comment:    (NOTE) Pre diabetes:          5.7%-6.4%  Diabetes:              >6.4%  Glycemic control for   <7.0% adults with diabetes   06/25/2015 05:06 AM 6.2 (H) 4.8 - 5.6 % Final    Comment:    (NOTE)         Pre-diabetes: 5.7 - 6.4         Diabetes: >6.4         Glycemic control for adults with diabetes: <7.0     CBG: Recent Labs  Lab 02/09/21 1720 02/09/21 1954 02/10/21 0017 02/10/21 0406 02/10/21 0750  GLUCAP 112* 118* 144* 145* 137*     Recent Results (from the past 240 hour(s))  Resp Panel by RT-PCR (Flu A&B, Covid) Nasopharyngeal Swab     Status: None   Collection Time: 02/06/21 11:55 AM   Specimen: Nasopharyngeal Swab; Nasopharyngeal(NP) swabs in vial transport medium  Result Value Ref Range Status   SARS Coronavirus 2 by RT PCR NEGATIVE NEGATIVE Final    Comment: (NOTE) SARS-CoV-2 target nucleic acids are NOT DETECTED.  The SARS-CoV-2 RNA is generally detectable in upper respiratory specimens during the acute phase of infection. The lowest concentration of SARS-CoV-2 viral copies this assay can detect is 138 copies/mL. A negative result does not preclude SARS-Cov-2 infection and should not be used as the sole basis for treatment or other patient management decisions. A negative result may occur with  improper specimen collection/handling, submission of specimen other than nasopharyngeal swab, presence of viral mutation(s) within the areas targeted by this assay, and inadequate number of viral copies(<138 copies/mL). A  negative result must be combined with clinical observations, patient history, and epidemiological information. The expected result is Negative.  Fact Sheet for Patients:  BloggerCourse.com  Fact Sheet for Healthcare Providers:  SeriousBroker.it  This test is no t yet approved or cleared by the [place] FDA and  has been authorized for detection and/or diagnosis of SARS-CoV-2 by FDA under an Emergency Use Authorization (EUA). This EUA will remain  in effect (meaning this test can be used) for the duration of the COVID-19 declaration under Section 564(b)(1) of the Act, 21 U.S.C.section 360bbb-3(b)(1), unless the authorization is terminated  or revoked sooner.       Influenza A by PCR NEGATIVE NEGATIVE Final   Influenza B by PCR NEGATIVE NEGATIVE Final    Comment: (NOTE)  The Xpert Xpress SARS-CoV-2/FLU/RSV plus assay is intended as an aid in the diagnosis of influenza from Nasopharyngeal swab specimens and should not be used as a sole basis for treatment. Nasal washings and aspirates are unacceptable for Xpert Xpress SARS-CoV-2/FLU/RSV testing.  Fact Sheet for Patients: BloggerCourse.com  Fact Sheet for Healthcare Providers: SeriousBroker.it  This test is not yet approved or cleared by the [place] FDA and has been authorized for detection and/or diagnosis of SARS-CoV-2 by FDA under an Emergency Use Authorization (EUA). This EUA will remain in effect (meaning this test can be used) for the duration of the COVID-19 declaration under Section 564(b)(1) of the Act, 21 U.S.C. section 360bbb-3(b)(1), unless the authorization is terminated or revoked.  Performed at [facility], [address], [place], [place] [address]   MRSA Next Gen by PCR, Nasal     Status: None   Collection Time: 02/09/21  8:29 AM   Specimen: Nasal Mucosa; Nasal Swab  Result  Value Ref Range Status   MRSA by PCR Next Gen NOT DETECTED NOT DETECTED Final    Comment: (NOTE) The GeneXpert MRSA Assay (FDA approved for NASAL specimens only), is one component of a comprehensive MRSA colonization surveillance program. It is not intended to diagnose MRSA infection nor to guide or monitor treatment for MRSA infections. Test performance is not FDA approved in patients less than 33 years old. Performed at [facility], [address], [place], [place] [address]       Scheduled Meds:  heparin  5,000 Units Subcutaneous Q8H   insulin aspart  0-6 Units Subcutaneous Q4H   mometasone-formoterol  2 puff Inhalation BID   pantoprazole (PROTONIX) IV  40 mg Intravenous Q24H   QUEtiapine  12.5 mg Oral QHS   sodium chloride flush  3 mL Intravenous Q12H   Continuous Infusions:  sodium chloride 60 mL/hr at 02/09/21 1752   methocarbamol (ROBAXIN) IV 500 mg (02/10/21 0551)     LOS: 3 days   Lonia Blood, MD [facility] Office  [phone] Pager - Text Page per Loretha Stapler  If 7PM-7AM, please contact night-coverage per Amion 02/10/2021, 8:43 AM

## 2021-02-10 NOTE — Progress Notes (Signed)
Occupational Therapy Treatment Patient Details Name: Laurie [name] MRN: [id number] DOB: 04/23/1955 Today's Date: 02/10/2021   History of present illness Laurie [name] is a 65 y.o. female with a history of T2DM, HTN, HLD, anxiety/depression, GERD, obesity and multiple abdominopelvic surgeries who presented to the ED 02/06/21 with abdominal pain , CT revealed an incarcerated ventral hernia. S/P small bowell resection, hernia repair and lysis of adhesions 02/06/21.   OT comments  Treatment focused on improving activity tolerance. Patient able to stand at sink to perform grooming x 5 minutes and reports fatigue after. Patient min assist for scooting to edge of bed and min guard for in room ambulation. Reports increased abdominal pain compared to prior visit. Will continue POC.    Recommendations for follow up therapy are one component of a multi-disciplinary discharge planning process, led by the attending physician.  Recommendations may be updated based on patient status, additional functional criteria and insurance authorization.    Follow Up Recommendations  Home health OT    Assistance Recommended at Discharge Frequent or constant Supervision/Assistance  Equipment Recommendations  Other (comment) (TBD)    Recommendations for Other Services      Precautions / Restrictions Precautions Precautions: Other (comment) Precaution Comments: abdomen, NG suction, ice chips only Restrictions Weight Bearing Restrictions: No       Mobility Bed Mobility Overal bed mobility: Needs Assistance Bed Mobility: Supine to Sit     Supine to sit: Min assist     General bed mobility comments: min assist and increased time to transfer to side of bed requiring hand holds to pull herself forward.    Transfers Overall transfer level: Needs assistance Equipment used: Rolling walker (2 wheels)   Sit to Stand: Min guard;From elevated surface           General transfer comment: min  guard for ambulation to sink and standing at sinnk. No overt loss of balance. Verbals to manuever backwards to chair.     Balance Overall balance assessment: Mild deficits observed, not formally tested                                         ADL either performed or assessed with clinical judgement   ADL Overall ADL's : Needs assistance/impaired     Grooming: Wash/dry face;Wash/dry hands;Min guard;Standing Grooming Details (indicate cue type and reason): stood at sink x 5 minutes to perform grooming tasks. Elbows propped on counter.                             Functional mobility during ADLs: Min guard;Rolling walker (2 wheels)       Vision Patient Visual Report: No change from baseline     Perception     Praxis      Cognition Arousal/Alertness: Awake/alert Behavior During Therapy: WFL for tasks assessed/performed Overall Cognitive Status: Within Functional Limits for tasks assessed                                            Exercises     Shoulder Instructions       General Comments      Pertinent Vitals/ Pain       Pain Assessment: PAINAD Breathing: normal Negative Vocalization: occasional  moan/groan, low speech, negative/disapproving quality Facial Expression: sad, frightened, frown Body Language: relaxed Consolability: no need to console PAINAD Score: 2 Pain Location: abdomen Pain Descriptors / Indicators: Grimacing;Guarding Pain Intervention(s): Monitored during session;Limited activity within patient's tolerance  Home Living                                          Prior Functioning/Environment              Frequency  Min 2X/week        Progress Toward Goals  OT Goals(current goals can now be found in the care plan section)  Progress towards OT goals: Progressing toward goals  Acute Rehab OT Goals OT Goal Formulation: With patient Time For Goal Achievement:  02/22/21 Potential to Achieve Goals: Good  Plan Discharge plan remains appropriate    Co-evaluation                 AM-PAC OT "6 Clicks" Daily Activity     Outcome Measure   Help from another person eating meals?: Total (NPO) Help from another person taking care of personal grooming?: A Little Help from another person toileting, which includes using toliet, bedpan, or urinal?: A Lot Help from another person bathing (including washing, rinsing, drying)?: A Lot Help from another person to put on and taking off regular upper body clothing?: A Little Help from another person to put on and taking off regular lower body clothing?: A Lot 6 Click Score: 13    End of Session Equipment Utilized During Treatment: Rolling walker (2 wheels)  OT Visit Diagnosis: Unsteadiness on feet (R26.81);Other abnormalities of gait and mobility (R26.89)   Activity Tolerance Patient limited by pain   Patient Left in chair;with call bell/phone within reach;with nursing/sitter in room;with chair alarm set   Nurse Communication Mobility status        Time: 1110-1135 OT Time Calculation (min): 25 min  Charges: OT General Charges $OT Visit: 1 Visit OT Treatments $Self Care/Home Management : 8-22 mins $Therapeutic Activity: 8-22 mins  Kaydon Husby, OTR/L Acute Care Rehab Services  Office [phone] Pager: [phone]   Kelli Churn 02/10/2021, 2:00 PM

## 2021-02-10 NOTE — Progress Notes (Signed)
Patient ID: Laurie [name], female   DOB: December 15, 1955, 65 y.o.   MRN: [id number]   I reviewed her CT scan.  I do not believe she has a leak.  We will consult IR to see whether they can place a drain or aspirate the fluid for diagnostic purposes given her elevated white blood count.

## 2021-02-10 NOTE — Consult Note (Signed)
WOC Nurse Consult Note: Reason for Consult: placement of NPWT dressing to midline wound Wound type: surgical Pressure Injury POA: N/A Measurement: 14.5cm x 6cm x 4cm Wound AXE:NMMH pink, dry Drainage (amount, consistency, odor) scant serous Periwound: intact, clear Dressing procedure/placement/frequency: Wound cleansed with NS< patted gently dry. One pice of black foam used to obliterate dead space, this is covered with drape and dressing attached to 160mmHg continuous negative pressure.  An immediate seal is achieved. Bedside RNs to perform dressing changes moving forward every M/W/F. One dressing kit left in room for Monday's change.  [place] Nurse ostomy consult note Stoma type/location: RLQ continent ostomy stoma, red, moist Size: 7/8 inches round, lumen in center  Patient catheterizes this stoma 4-5 times per day.  Bedside RNs will need to support and assist as needed during this hospitalization. Orders provided for nursing staff to facilitate this self care action.  [place] nursing team will not follow, but will remain available to this patient, the nursing and medical teams.  Please re-consult if needed. Thanks, Maudie Flakes, MSN, RN, [place], Arther Abbott  Pager# [phone]

## 2021-02-10 NOTE — Progress Notes (Signed)
Genevive Bi, PA present and RN made her aware of external length of NG tube currently at 63 cm but that prior nurses were documenting measurement at 53 cm. Since external length of NG tube being measured at 63 cm CT scan verified correct position. Kabrich, PA acknowledged and gave no new orders.

## 2021-02-11 ENCOUNTER — Inpatient Hospital Stay (HOSPITAL_COMMUNITY): Payer: Medicare PPO

## 2021-02-11 DIAGNOSIS — R188 Other ascites: Secondary | ICD-10-CM

## 2021-02-11 DIAGNOSIS — K436 Other and unspecified ventral hernia with obstruction, without gangrene: Secondary | ICD-10-CM | POA: Diagnosis not present

## 2021-02-11 DIAGNOSIS — N179 Acute kidney failure, unspecified: Secondary | ICD-10-CM | POA: Diagnosis not present

## 2021-02-11 LAB — COMPREHENSIVE METABOLIC PANEL
ALT: 18 U/L (ref 0–44)
AST: 24 U/L (ref 15–41)
Albumin: 2.7 g/dL — ABNORMAL LOW (ref 3.5–5.0)
Alkaline Phosphatase: 85 U/L (ref 38–126)
Anion gap: 8 (ref 5–15)
BUN: 17 mg/dL (ref 8–23)
CO2: 21 mmol/L — ABNORMAL LOW (ref 22–32)
Calcium: 9 mg/dL (ref 8.9–10.3)
Chloride: 116 mmol/L — ABNORMAL HIGH (ref 98–111)
Creatinine, Ser: 0.99 mg/dL (ref 0.44–1.00)
GFR, Estimated: >60 mL/min (ref >60)
Glucose, Bld: 167 mg/dL — ABNORMAL HIGH (ref 70–99)
Potassium: 4.2 mmol/L (ref 3.5–5.1)
Sodium: 145 mmol/L (ref 135–145)
Total Bilirubin: 0.6 mg/dL (ref 0.3–1.2)
Total Protein: 7.1 g/dL (ref 6.5–8.1)

## 2021-02-11 LAB — MAGNESIUM: Magnesium: 1.9 mg/dL (ref 1.7–2.4)

## 2021-02-11 LAB — GLUCOSE, CAPILLARY
Glucose-Capillary: 147 mg/dL — ABNORMAL HIGH (ref 70–99)
Glucose-Capillary: 149 mg/dL — ABNORMAL HIGH (ref 70–99)
Glucose-Capillary: 158 mg/dL — ABNORMAL HIGH (ref 70–99)
Glucose-Capillary: 158 mg/dL — ABNORMAL HIGH (ref 70–99)
Glucose-Capillary: 164 mg/dL — ABNORMAL HIGH (ref 70–99)
Glucose-Capillary: 178 mg/dL — ABNORMAL HIGH (ref 70–99)

## 2021-02-11 LAB — CBC
HCT: 34.7 % — ABNORMAL LOW (ref 36.0–46.0)
Hemoglobin: 10.9 g/dL — ABNORMAL LOW (ref 12.0–15.0)
MCH: 31.8 pg (ref 26.0–34.0)
MCHC: 31.4 g/dL (ref 30.0–36.0)
MCV: 101.2 fL — ABNORMAL HIGH (ref 80.0–100.0)
Platelets: 389 10*3/uL (ref 150–400)
RBC: 3.43 MIL/uL — ABNORMAL LOW (ref 3.87–5.11)
RDW: 14.7 % (ref 11.5–15.5)
WBC: 21.3 10*3/uL — ABNORMAL HIGH (ref 4.0–10.5)
nRBC: 0.2 % (ref 0.0–0.2)

## 2021-02-11 LAB — PHOSPHORUS: Phosphorus: 3 mg/dL (ref 2.5–4.6)

## 2021-02-11 IMAGING — CT CT IMAGE GUIDED DRAINAGE BY PERCUTANEOUS CATHETER
1 of 3 series · 13 of 32 positions shown, 18 images · non-contrast
Comparison: none

CLINICAL DATA: Status post repair of incarcerated ventral hernia
with development of postoperative fluid collection of the anterior
peritoneal cavity immediately deep to the abdominal wall.

[Series 2: i-spiral 5.0 bf37 · axial · 0.86mm/px · z∈[+1265,+1517]mm · 13 of 81 slices shown, 18 images]
[im 5/81  soft-tissue]
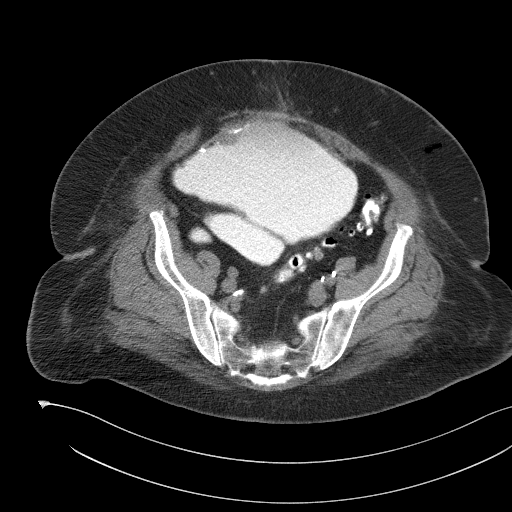
[im 5/81  bone]
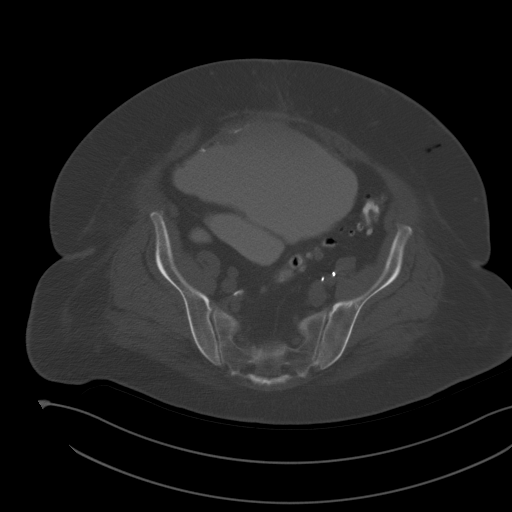
[im 13/81  soft-tissue]
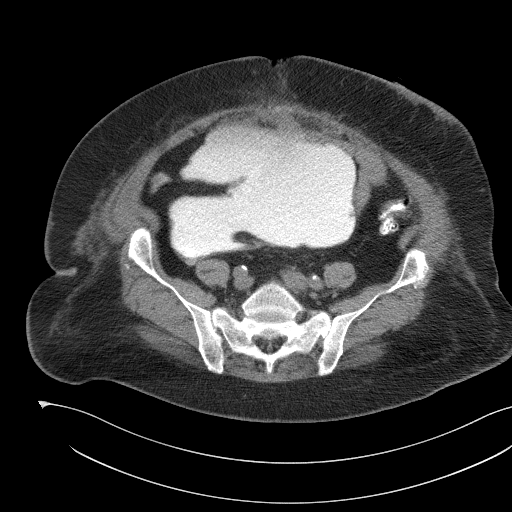
[im 17/81  soft-tissue]
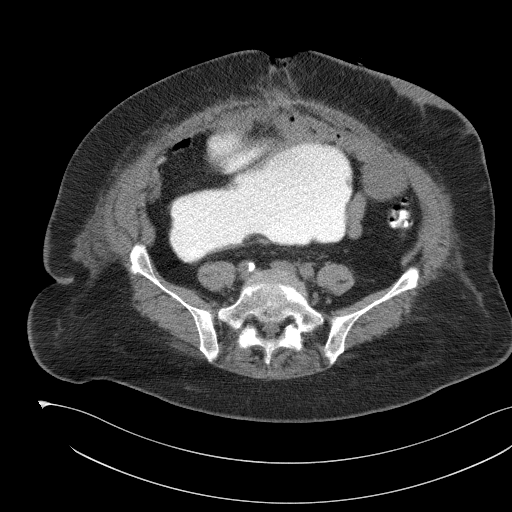
[im 25/81  soft-tissue]
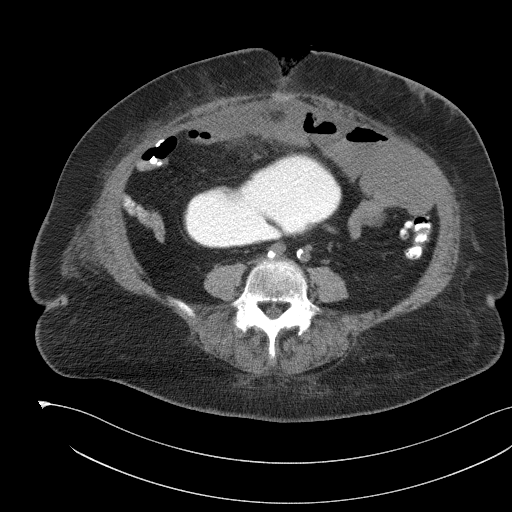
[im 33/81  soft-tissue]
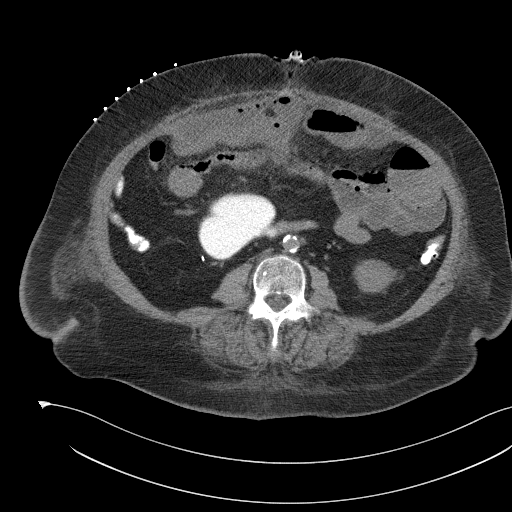
[im 37/81  soft-tissue]
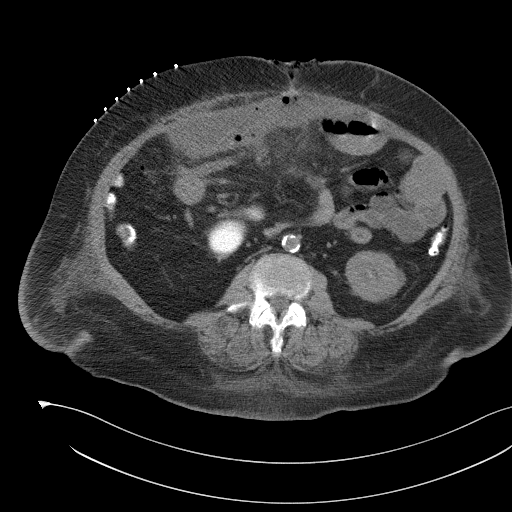
[im 45/81  soft-tissue]
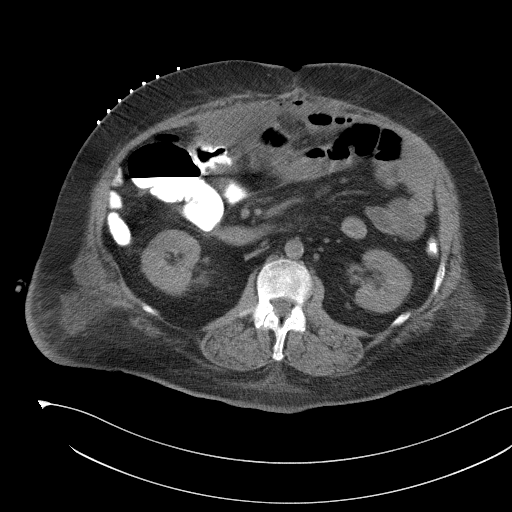
[im 49/81  soft-tissue]
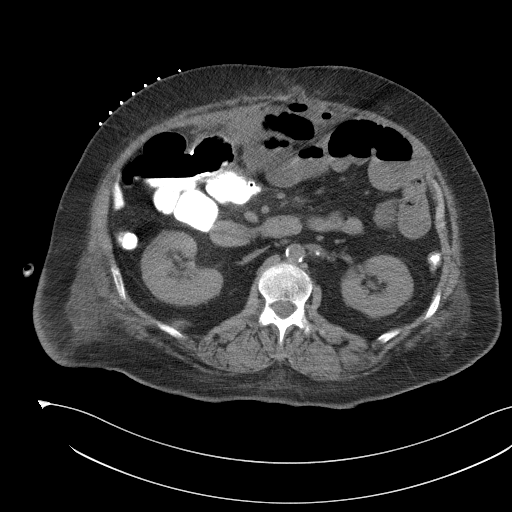
[im 57/81  soft-tissue]
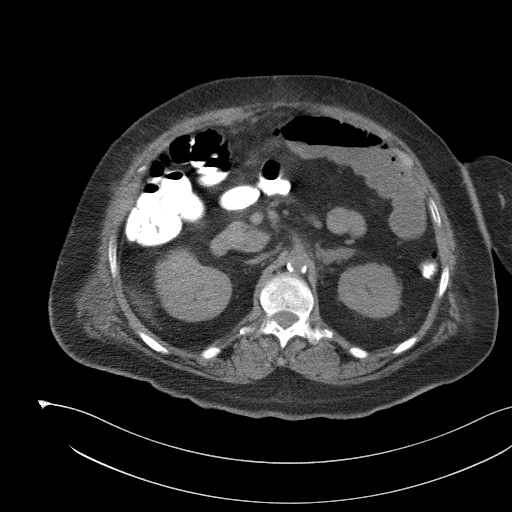
[im 57/81  bone]
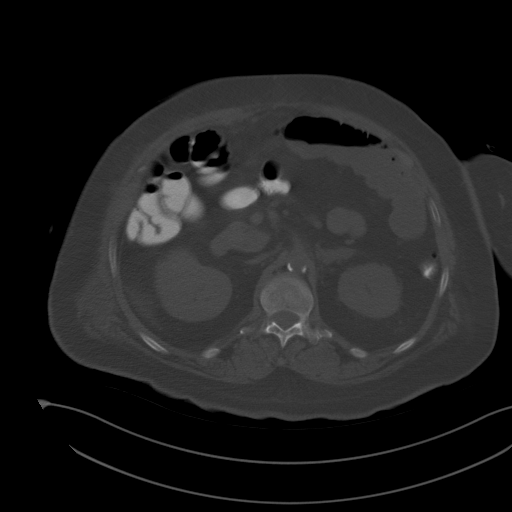
[im 65/81  soft-tissue]
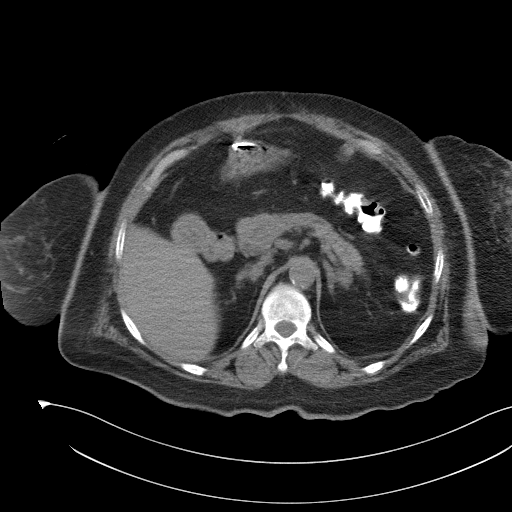
[im 65/81  lung]
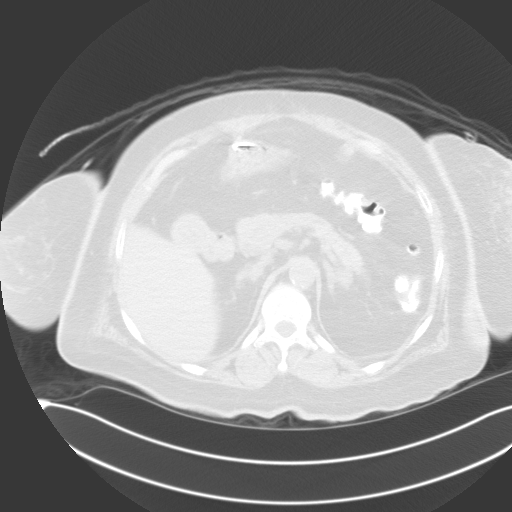
[im 69/81  soft-tissue]
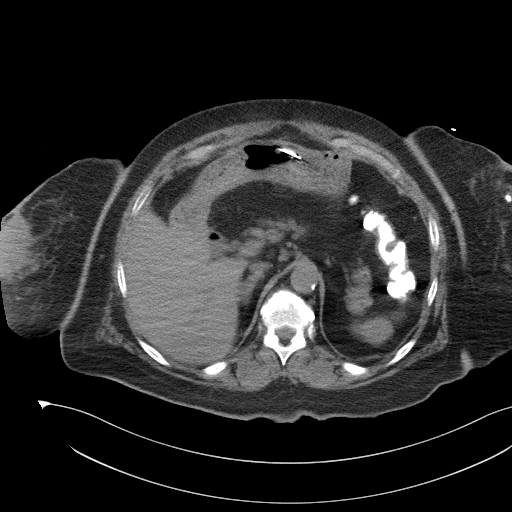
[im 69/81  lung]
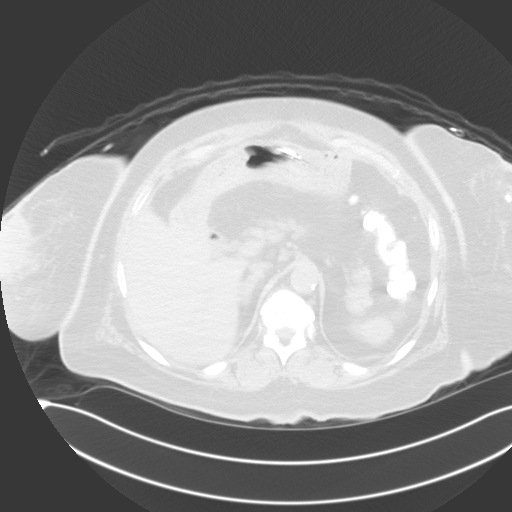
[im 73/81  lung]
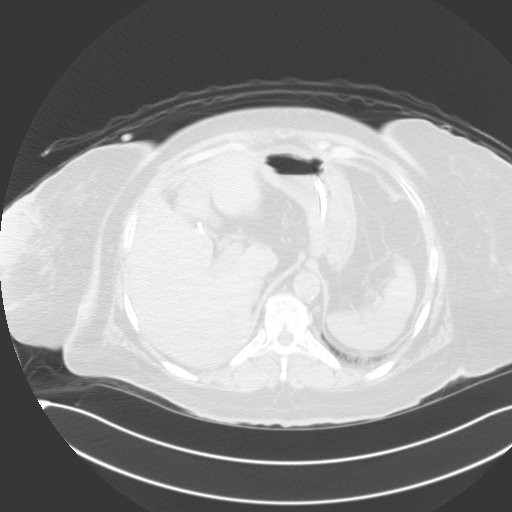
[im 77/81  soft-tissue]
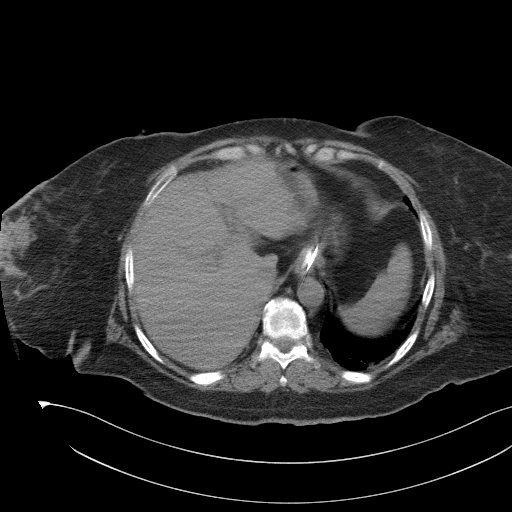
[im 77/81  lung]
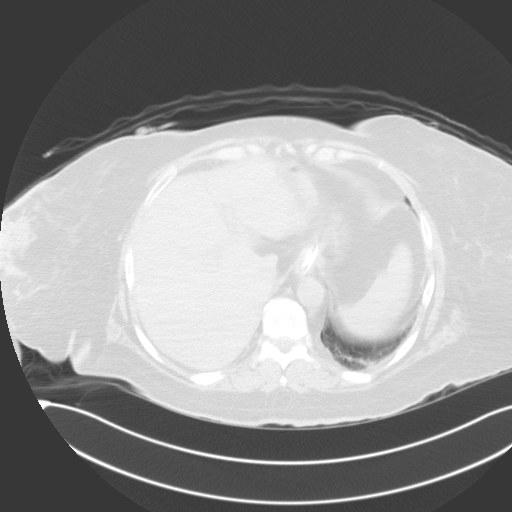

[13 of 32 positions shown; findings below may reference images not displayed]

EXAM:
CT GUIDED CATHETER DRAINAGE OF PERITONEAL ABSCESS

ANESTHESIA/SEDATION:
Moderate (conscious) sedation was employed during this procedure. A
total of Versed 1.5 mg and Fentanyl 75 mcg was administered
intravenously by radiology nursing under my supervision.

Moderate Sedation Time: 42 minutes. The patient's level of
consciousness and vital signs were monitored continuously by
radiology nursing throughout the procedure under my direct
supervision.

PROCEDURE:
The procedure, risks, benefits, and alternatives were explained to
the patient. Questions regarding the procedure were encouraged and
answered. The patient understands and consents to the procedure. A
time out was performed prior to initiating the procedure.

The abdominal wall was prepped with chlorhexidine in a sterile
fashion, and a sterile drape was applied covering the operative
field. A sterile gown and sterile gloves were used for the
procedure. Local anesthesia was provided with 1% Lidocaine.

Under CT guidance, an 18 gauge trocar needle was advanced into an
anterior peritoneal fluid collection from a right-sided anterior
approach. After confirming needle tip position and aspiration of
fluid via the needle, a guidewire was advanced and the needle
removed. The tract was dilated and a 10 French percutaneous drainage
catheter placed. The catheter was formed and attached to a suction
bulb. Additional CT was performed. A fluid sample was sent for
culture analysis. The catheter was secured at the skin with a
Prolene retention suture and adhesive StatLock device.

COMPLICATIONS:
None
FINDINGS: Aspiration at the level of the anterior peritoneal fluid collection
just to the right of midline yielded purulent fluid. There was good
return of fluid after drain placement.
IMPRESSION: CT-guided percutaneous catheter drainage of anterior peritoneal
abscess yielding purulent fluid. A fluid sample was sent for culture
analysis. A 10 French drain was placed and attached to suction bulb
drainage.

## 2021-02-11 MED ORDER — NALOXONE HCL 0.4 MG/ML IJ SOLN
INTRAMUSCULAR | Status: AC
  Filled 2021-02-11: qty 1

## 2021-02-11 MED ORDER — SODIUM CHLORIDE 0.9 % IV SOLN
INTRAVENOUS | Status: AC
  Filled 2021-02-11: qty 250

## 2021-02-11 MED ORDER — MIDAZOLAM HCL 2 MG/2ML IJ SOLN
INTRAMUSCULAR | Status: AC
  Filled 2021-02-11: qty 4

## 2021-02-11 MED ORDER — METOPROLOL TARTRATE 5 MG/5ML IV SOLN
10.0000 mg | Freq: Three times a day (TID) | INTRAVENOUS | Status: DC
  Administered 2021-02-11 – 2021-02-13 (×7): 10 mg via INTRAVENOUS
  Filled 2021-02-11 (×6): qty 10

## 2021-02-11 MED ORDER — PHENOL 1.4 % MT LIQD
1.0000 | OROMUCOSAL | Status: DC | PRN
  Filled 2021-02-11: qty 177

## 2021-02-11 MED ORDER — FLUMAZENIL 0.5 MG/5ML IV SOLN
INTRAVENOUS | Status: AC
  Filled 2021-02-11: qty 5

## 2021-02-11 MED ORDER — FENTANYL CITRATE (PF) 100 MCG/2ML IJ SOLN
INTRAMUSCULAR | Status: AC
  Filled 2021-02-11: qty 4

## 2021-02-11 MED ORDER — FENTANYL CITRATE (PF) 100 MCG/2ML IJ SOLN
INTRAMUSCULAR | Status: AC | PRN
  Administered 2021-02-11 (×3): 25 ug via INTRAVENOUS

## 2021-02-11 MED ORDER — MIDAZOLAM HCL 2 MG/2ML IJ SOLN
INTRAMUSCULAR | Status: AC | PRN
  Administered 2021-02-11 (×3): .5 mg via INTRAVENOUS

## 2021-02-11 NOTE — Sedation Documentation (Signed)
Patient is resting comfortably. 

## 2021-02-11 NOTE — Progress Notes (Signed)
NG tube clamped.

## 2021-02-11 NOTE — Consult Note (Signed)
Chief Complaint: Patient was seen in consultation today for  Chief Complaint  Patient presents with   Abdominal Pain   Diarrhea    Referring Physician(s): Carl Best, PA-C  Supervising Physician: Irish Lack  Patient Status: [facility] - In-pt  History of Present Illness: Laurie [name] is a 65 y.o. 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 CT revealed an incarcerated ventral hernia. She was taken to the OR that evening with Dr. Sheliah Hatch for lysis of adhesions, small bowel resection with anastomosis and incisional hernia repair.   She was evaluated by the surgical team yesterday and was noted to have an increase in WBC and worsening abdominal pain. Repeat imaging obtained.  CT Abdomen There are intraperitoneal fluid pockets containing a small amount of air approximating small-bowel loops in the anterior abdominal wall. The largest pocket is seen along the anterior and midline abdominal wall at the level of the umbilicus measuring 11 x 21 by 10 cm. There is no definitive wall enhancement. There also a few small foci of free intraperitoneal air in the anterior upper abdomen. IMPRESSION: 1. Status post umbilical hernia repair. There is a small amount of free air in the upper abdomen. 2. Short segment small bowel wall thickening with associated mesenteric edema in the anterior abdomen at the surgical level worrisome for nonspecific enteritis. 3. Intraperitoneal fluid collection containing air along the anterior abdominal wall approximating inflamed small bowel loops. Differential diagnosis includes abscess or other postoperative fluid collection. Small bowel perforation or fistula cannot be excluded given that there are inflamed small bowel loops at this  level. 4. Nasogastric tube tip in the distal stomach. 5. Urinary diversion appears uncomplicated. No hydronephrosis. Stable calculus in the urinary diversion. 6. Sigmoid colon diverticulosis. 7.  Aortic Atherosclerosis (ICD10-I70.0).   Interventional Radiology has been asked to evaluate this patient for an image-guided intra-peritoneal fluid collection aspiration with drain placement. This case has been reviewed and procedure approved by Dr. Fredia Sorrow.   Past Medical History:  Diagnosis Date   Anal fissure    Asthma    B12 deficiency    Candidal esophagitis (HCC)    Carpal tunnel syndrome    Chronic abdominal pain    Chronic UTI    CKD (chronic kidney disease)    Coronary artery disease    Diabetes mellitus without complication (HCC)    Diabetic gastropathy (HCC)    Episodic low back pain    GERD (gastroesophageal reflux disease)    GI bleed    Glaucoma    Hepatitis C    Hypertension    IBS (irritable bowel syndrome)    Interstitial cystitis    Iron deficiency anemia    Myofascial pain syndrome    Obesity (BMI 35.0-39.9 without comorbidity)    Plantar fasciitis     Past Surgical History:  Procedure Laterality Date   ABDOMINAL HYSTERECTOMY     BILATERAL CARPAL TUNNEL RELEASE     BREAST SURGERY     CHOLECYSTECTOMY     COLONOSCOPY     ESOPHAGOGASTRODUODENOSCOPY     ESOPHAGOGASTRODUODENOSCOPY (EGD) WITH PROPOFOL N/A 08/22/2016   Procedure: ESOPHAGOGASTRODUODENOSCOPY (EGD) WITH PROPOFOL;  Surgeon: Rachael Fee, MD;  Location: [facility];  Service: Endoscopy;  Laterality: N/A;   ESOPHAGOGASTRODUODENOSCOPY (EGD) WITH PROPOFOL N/A 02/11/2017   Procedure: ESOPHAGOGASTRODUODENOSCOPY (EGD) WITH PROPOFOL;  Surgeon: Beverley Fiedler, MD;  Location: [facility];  Service: Gastroenterology;  Laterality: N/A;   INCISION AND DRAINAGE ABSCESS Left 06/25/2015   Procedure: INCISION AND DRAINAGE ABSCESS;  Surgeon: Romie Levee, MD;  Location: [facility];  Service: General;  Laterality: Left;    LAPAROSCOPY N/A 02/06/2021   Procedure: EXPLORATORY LAPAROTOMY; LYSIS OF ADHESIONS; INCISIONAL HERNIA REPAIR,SMALL BOWEL RESECTION;  Surgeon: Kinsinger, De Blanch, MD;  Location: [facility];  Service: General;  Laterality: N/A;   REVISION UROSTOMY CUTANEOUS      Allergies: Buchu-cornsilk-ch grass-hydran, Celebrex [celecoxib], Ciprofloxacin, Cymbalta [duloxetine hcl], Dilaudid [hydromorphone hcl], Fentanyl and related, Glucophage [metformin hcl], Oxycodone, Pregabalin, Hydrocodone-acetaminophen, Morphine and related, Sulfa antibiotics, and Vicodin [hydrocodone-acetaminophen]  Medications: Prior to Admission medications   Medication Sig Start Date End Date Taking? Authorizing Provider  albuterol (PROVENTIL HFA;VENTOLIN HFA) 108 (90 BASE) MCG/ACT inhaler Inhale 2 puffs into the lungs every 4 (four) hours as needed for wheezing or shortness of breath.   Yes [provider]  amLODipine (NORVASC) 10 MG tablet Take 10 mg by mouth daily.    Yes [provider]  aspirin EC 81 MG tablet Take 81 mg by mouth daily.   Yes [provider]  budesonide-formoterol (SYMBICORT) 80-4.5 MCG/ACT inhaler Inhale 2 puffs into the lungs 2 (two) times daily.   Yes [provider]  cholecalciferol (VITAMIN D) 1000 UNITS tablet Take 1,000 Units by mouth daily.   Yes [provider]  cyanocobalamin 1000 MCG tablet Take 1,000 mcg by mouth daily.    Yes [provider]  dicyclomine (BENTYL) 20 MG tablet Take 20 mg 2 (two) times daily by mouth. 08/30/16 02/07/21 Yes [provider]  FLUoxetine (PROZAC) 20 MG capsule Take 20 mg by mouth daily.    Yes [provider]  fluticasone (FLONASE) 50 MCG/ACT nasal spray Place 2 sprays into both nostrils daily as needed for allergies or rhinitis.   Yes [provider]  glucagon 1 MG injection Inject 1 mg into the vein once as needed (severe insulin reaction).   Yes [provider]  hydrocortisone  (ANUSOL-HC) 2.5 % rectal cream Place 1 application rectally 3 (three) times daily as needed for hemorrhoids.    Yes [provider]  insulin glargine (LANTUS) 100 UNIT/ML injection Inject 50 Units into the skin at bedtime.   Yes [provider]  isosorbide mononitrate (IMDUR) 60 MG 24 hr tablet Take 60 mg by mouth daily.   Yes [provider]  lidocaine (XYLOCAINE) 5 % ointment Apply 1 application topically 4 (four) times daily as needed for moderate pain.   Yes [provider]  LIRAGLUTIDE Campbell Inject 1.8 mg into the skin daily.   Yes [provider]  lisinopril (ZESTRIL) 5 MG tablet Take 5 mg by mouth daily. 08/02/16  Yes [provider]  loperamide (IMODIUM) 2 MG capsule Take 4 mg by mouth daily as needed for diarrhea or loose stools.   Yes [provider]  magnesium oxide (MAG-OX) 400 MG tablet Take 400 mg by mouth 2 (two) times daily.   Yes [provider]  metFORMIN (GLUCOPHAGE-XR) 500 MG 24 hr tablet Take 500 mg by mouth daily with breakfast.   Yes [provider]  metoprolol succinate (TOPROL-XL) 100 MG 24 hr tablet Take 100 mg by mouth daily. Take with or immediately following a meal.   Yes [provider]  Multiple Vitamin (MULTIVITAMIN WITH MINERALS) TABS tablet Take 1 tablet by mouth daily.   Yes [provider]  nitroGLYCERIN (NITROSTAT) 0.4 MG SL  tablet Place 0.4 mg under the tongue every 5 (five) minutes as needed for chest pain.   Yes [provider]  Nitroglycerin 0.4 % OINT Place 1 application rectally 2 (two) times daily as needed (for chest pain.).    Yes [provider]  pantoprazole (PROTONIX) 20 MG tablet Take 20 mg by mouth daily.   Yes [provider]  promethazine (PHENERGAN) 25 MG tablet Take 25 mg by mouth every 6 (six) hours as needed for nausea or vomiting.   Yes [provider]  ranolazine (RANEXA) 500 MG 12 hr tablet Take 500 mg by mouth 2  (two) times daily.   Yes [provider]  rosuvastatin (CRESTOR) 10 MG tablet Take 10 mg by mouth daily.   Yes [provider]  temazepam (RESTORIL) 7.5 MG capsule Take 7.5 mg by mouth at bedtime as needed for sleep.   Yes [provider]  traMADol (ULTRAM) 50 MG tablet Take 1 tablet (50 mg total) every 6 (six) hours as needed by mouth. 02/15/17  Yes Bethel Born, PA-C  traZODone (DESYREL) 50 MG tablet Take 50 mg by mouth at bedtime.   Yes [provider]  triamcinolone cream (KENALOG) 0.5 % Apply 1 application topically 2 (two) times daily as needed (for skin irritation).    Yes [provider]  gabapentin (NEURONTIN) 300 MG capsule Take 600 mg by mouth 2 (two) times daily.    [provider]     Family History  Problem Relation Age of Onset   CAD Mother    Cancer Father    Stroke Brother     Social History   Socioeconomic History   Marital status: Widowed    Spouse name: Not on file   Number of children: Not on file   Years of education: Not on file   Highest education level: Not on file  Occupational History   Not on file  Tobacco Use   Smoking status: Former   Smokeless tobacco: Never  Substance and Sexual Activity   Alcohol use: No   Drug use: No   Sexual activity: Not on file  Other Topics Concern   Not on file  Social History Narrative   Not on file   Social Determinants of Health   Financial Resource Strain: Not on file  Food Insecurity: Not on file  Transportation Needs: Not on file  Physical Activity: Not on file  Stress: Not on file  Social Connections: Not on file    Review of Systems: A 12 point ROS discussed and pertinent positives are indicated in the HPI above.  All other systems are negative.  Review of Systems  Constitutional:  Positive for appetite change and fatigue.  Respiratory:  Negative for cough and shortness of breath.   Cardiovascular:  Negative for chest pain and leg swelling.   Gastrointestinal:  Positive for abdominal distention, abdominal pain and nausea.  Neurological:  Negative for headaches.   Vital Signs: BP (!) 159/75   Pulse 91   Temp 98.6 F (37 C) (Oral)   Resp (!) 24   Ht  (1.626 m)   Wt 200 lb (90.7 kg)   SpO2 93%   BMI 34.33 kg/m   Physical Exam Constitutional:      General: She is not in acute distress.    Appearance: She is obese.  HENT:     Mouth/Throat:     Mouth: Mucous membranes are moist.     Pharynx: Oropharynx is clear.  Cardiovascular:     Rate and Rhythm: Normal rate and regular rhythm.  Pulmonary:     Effort: Pulmonary effort is normal.     Breath sounds: Normal breath sounds.  Abdominal:     Tenderness: There is abdominal tenderness.     Comments: Distended; hypoactive bowel sounds. Midline abdominal wound vac in place. RLQ urinary diversion/ostomy.   Musculoskeletal:     Right lower leg: No edema.     Left lower leg: No edema.  Skin:    General: Skin is warm and dry.  Neurological:     Mental Status: She is alert and oriented to person, place, and time.    Imaging: CT ABDOMEN PELVIS WO CONTRAST  Result Date: 02/06/2021 CLINICAL DATA:  Abdominal pain, hernia suspected. Status post manual attempted reduction. Vomiting and diarrhea history of urinary diversion. EXAM: CT ABDOMEN AND PELVIS WITHOUT CONTRAST TECHNIQUE: Multidetector CT imaging of the abdomen and pelvis was performed following the standard protocol without IV contrast. COMPARISON:  CT 2-1/2 hours ago. FINDINGS: Lower chest: Stable chronic right lung base scarring. No acute findings or change from earlier today. Hepatobiliary: No focal liver abnormality is seen. Status post cholecystectomy. No biliary dilatation. Pancreas: Mild parenchymal atrophy. No ductal dilatation or inflammation. Spleen: Normal in size without focal abnormality. Adrenals/Urinary Tract: No adrenal nodule. Stable mild left adrenal thickening. There is no hydronephrosis or renal  calculi. Status post cystectomy with urinary diversion. No ureteral dilatation. Stable 15 mm stone in the ureteral confluence, series 2, image 78. The ostomy is nondilated. Stomach/Bowel: Small-bowel obstruction secondary to umbilical hernia without significant change in the interim. Prominently fluid-filled distended stomach. There is fluid distending the distal esophagus. Small bowel is dilated and fluid-filled to the level of umbilical hernia. The exiting small bowel is completely decompressed. The hernia neck spans 13 mm, measured on series 2, image 56. There is minimal mesenteric edema. No bowel pneumatosis. Small bowel distal to the hernia is decompressed. Ileal colonic anastomosis in the right upper quadrant there is small volume of colonic stool. Distal descending and sigmoid colonic diverticulosis without diverticulitis. Vascular/Lymphatic: Aortic atherosclerosis. No aortic aneurysm. No portal venous or mesenteric gas. No bulky abdominopelvic adenopathy. Reproductive: Hysterectomy.  No adnexal mass. Other: No ascites or free air. Umbilical hernia containing a short segment of small bowel with associated small bowel obstruction. There is skin and soft tissue thickening of the left anterior abdominal wall, typical of medication injection site. Small fat containing inguinal hernias. Musculoskeletal: Stable from earlier today. IMPRESSION: 1. Persistent and unchanged small-bowel obstruction secondary to umbilical hernia. No evidence of perforation. 2. Status post cystectomy with urinary diversion. Unchanged 15 mm stone in the ureteral confluence. 3. Colonic diverticulosis without diverticulitis. Aortic Atherosclerosis (ICD10-I70.0). Electronically Signed   By: Narda Rutherford M.D.   On: 02/06/2021 18:01   CT ABDOMEN PELVIS WO CONTRAST  Result Date: 02/06/2021 CLINICAL DATA:  Nausea, diarrhea and vomiting. Diverticulitis suspected. History of interstitial cystitis and status post cystectomy with urinary  diversion. EXAM: CT ABDOMEN AND PELVIS WITHOUT CONTRAST TECHNIQUE: Multidetector CT imaging of the abdomen and pelvis was performed following the standard protocol without IV contrast. COMPARISON:  06/24/2015 and 12/28/2014 FINDINGS: Lower chest: Chronic volume loss or scarring in the medial right lung base. Otherwise, lung bases are clear. No pleural effusions. Hepatobiliary: Cholecystectomy.  Normal appearance of liver. Pancreas: Unremarkable. No pancreatic ductal dilatation or surrounding inflammatory changes. Spleen: Normal in size without focal abnormality. Adrenals/Urinary Tract: Normal appearance of the adrenal glands. Kidneys are slightly small  for size. No hydronephrosis or ureter stones. Status post cystectomy with urinary diversion or ostomy in the right lower quadrant. Ileal conduit is not distended but there is a large stone within the conduit that measures 1.5 cm and this is new. No dilatation of the ureters. Stomach/Bowel: Stomach is distended with air-fluid level. Dilated loops of proximal small bowel containing fluid. Transition point appears to be related to a supraumbilical ventral hernia. The small bowel distal to the hernia is decompressed. Evidence for an ileocolic anastomosis in the right abdomen. No colonic dilatation. Multiple colonic diverticula particularly in the sigmoid colon. No colonic inflammation. Vascular/Lymphatic: Atherosclerotic calcifications in the abdominal aorta and iliac arteries without aneurysm. No significant lymph node enlargement in the abdomen or pelvis. Reproductive: Status post hysterectomy. No adnexal masses. Other: Negative for free fluid. Negative for free air. Small left inguinal hernia containing fat. Musculoskeletal: Degenerative facet arthropathy in the lower lumbar spine. Chronic areas of sclerosis in the lower thoracic spine. Chronic skin and superficial soft tissue thickening in the left anterior abdomen. IMPRESSION: 1. Small bowel obstruction secondary to  an incarcerated ventral hernia. Distended stomach and small bowel loops with a transition point associated with a ventral hernia. No evidence for free fluid. 2. Postsurgical changes associated with a cystectomy and ileal conduit. Negative for hydronephrosis. However, there is a large stone within the iliac conduit measuring up to 1.5 cm. 3.  Aortic Atherosclerosis (ICD10-I70.0). 4. Colonic diverticulosis but no evidence for acute diverticulitis. These results were called by telephone at the time of interpretation on 02/06/2021 at 3:04 pm to provider Pricilla Loveless , who verbally acknowledged these results. Electronically Signed   By: Richarda Overlie M.D.   On: 02/06/2021 15:05   CT ABDOMEN PELVIS W CONTRAST  Result Date: 02/10/2021 CLINICAL DATA:  Postoperative umbilical hernia repair. Abdominal pain and fever with leukocytosis. EXAM: CT ABDOMEN AND PELVIS WITH CONTRAST TECHNIQUE: Multidetector CT imaging of the abdomen and pelvis was performed using the standard protocol following bolus administration of intravenous contrast. CONTRAST:  79mL OMNIPAQUE IOHEXOL 350 MG/ML SOLN COMPARISON:  CT abdomen and pelvis 02/06/2021 FINDINGS: Lower chest: There is minimal atelectasis in the right lung base. Hepatobiliary: No focal liver abnormality is seen. Status post cholecystectomy. No biliary dilatation. Pancreas: Unremarkable. No pancreatic ductal dilatation or surrounding inflammatory changes. Spleen: Normal in size without focal abnormality. Adrenals/Urinary Tract: There is bilateral adrenal thickening similar to the prior examination. The bilateral kidneys are within normal limits. Patient is status post cystectomy. There is a right lower quadrant urinary diversion which is distended with fluid. There is a stable 14 mm calcification within the urinary diversion. Stomach/Bowel: Partial right colectomy changes are again seen. There is sigmoid colon diverticulosis without evidence for diverticulitis. There are mildly dilated  loops of small bowel proximally, but distal small bowel loops appear nondilated. Oral contrast is seen to reach distal small bowel. Patient is status post umbilical hernia repair. There is an abnormal loop of small bowel at the level of the umbilicus demonstrating wall thickening with a portion of this bowel approximating the anterior abdominal wall (images 2/48 through 54). There is no pneumatosis. There is mesenteric edema and fluid adjacent to small bowel loops at the surgical level. Nasogastric tube tip is in the distal stomach. The stomach is nondilated. Vascular/Lymphatic: Aortic atherosclerosis. No enlarged abdominal or pelvic lymph nodes. Reproductive: Status post hysterectomy. No adnexal masses. Other: Patient is status post umbilical hernia repair. There is no evidence for recurrent hernia. There is a small amount  of subcutaneous fluid and air at the level of the umbilicus compatible with recent surgery. There is also some skin thickening and edema of the left anterior abdominal wall, nonspecific. Subcutaneous air in the left abdominal wall may be related to medication injection sites. There are intraperitoneal fluid pockets containing a small amount of air approximating small-bowel loops in the anterior abdominal wall. The largest pocket is seen along the anterior and midline abdominal wall at the level of the umbilicus measuring 11 x 21 by 10 cm. There is no definitive wall enhancement. There also a few small foci of free intraperitoneal air in the anterior upper abdomen. Musculoskeletal: No acute or significant osseous findings. IMPRESSION: 1. Status post umbilical hernia repair. There is a small amount of free air in the upper abdomen. 2. Short segment small bowel wall thickening with associated mesenteric edema in the anterior abdomen at the surgical level worrisome for nonspecific enteritis. 3. Intraperitoneal fluid collection containing air along the anterior abdominal wall approximating inflamed  small bowel loops. Differential diagnosis includes abscess or other postoperative fluid collection. Small bowel perforation or fistula cannot be excluded given that there are inflamed small bowel loops at this level. 4. Nasogastric tube tip in the distal stomach. 5. Urinary diversion appears uncomplicated. No hydronephrosis. Stable calculus in the urinary diversion. 6. Sigmoid colon diverticulosis. 7.  Aortic Atherosclerosis (ICD10-I70.0). Electronically Signed   By: Darliss Cheney M.D.   On: 02/10/2021 15:25   DG Abd Portable 1V  Result Date: 02/07/2021 CLINICAL DATA:  NG tube placement. EXAM: PORTABLE ABDOMEN - 1 VIEW COMPARISON:  CT abdomen and pelvis 02/06/2021. FINDINGS: Nasogastric tube tip proximal stomach. Side hole is at the level of the gastroesophageal junction. Distended small bowel loops are again noted. There are surgical clips and sutures in the right abdomen. Visualized lung bases are clear. IMPRESSION: 1. Enteric tube tip is in the proximal stomach with side hole at the level of the gastroesophageal junction. Recommend advancing tube. 2. Stable dilated small bowel concerning for small obstruction. Electronically Signed   By: Darliss Cheney M.D.   On: 02/07/2021 19:44    Labs:  CBC: Recent Labs    02/08/21 0541 02/09/21 0443 02/10/21 0444 02/11/21 0513  WBC 21.9* 24.3* 27.3* 21.3*  HGB 12.0 11.7* 11.7* 10.9*  HCT 36.1 37.3 35.2* 34.7*  PLT 353 376 435* 389    COAGS: No results for input(s): INR, APTT in the last 8760 hours.  BMP: Recent Labs    02/08/21 0541 02/09/21 0443 02/10/21 0444 02/11/21 0513  NA 141 144 146* 145  K 4.7 4.1 3.6 4.2  CL 114* 114* 117* 116*  CO2 17* 17* 15* 21*  GLUCOSE 120* 96 144* 167*  BUN 34* 19 18 17   CALCIUM 8.4* 9.0 9.1 9.0  CREATININE 1.76* 1.04* 1.15* 0.99  GFRNONAA 32* 59* 53* >60    LIVER FUNCTION TESTS: Recent Labs    02/06/21 1315 02/07/21 0546 02/11/21 0513  BILITOT 0.4 0.7 0.6  AST 22 25 24   ALT 18 18 18   ALKPHOS 58  55 85  PROT 8.0 6.7 7.1  ALBUMIN 4.1 3.3* 2.7*    TUMOR MARKERS: No results for input(s): AFPTM, CEA, CA199, CHROMGRNA in the last 8760 hours.  Assessment and Plan:  Incarcerated ventral hernia s/p lysis of adhesions, small bowel resection with anastomosis and incisional hernia repair 02/06/21; post-op intra-abdominal fluid collection.  Laurie [name], 65 year old female, is scheduled today for an image-guided intra-abdominal fluid collection aspiration with drain placement. Telephone consent  was obtained from the patient's sister Laurie [name] (noted episodes of intermittent confusion with the patient) and the procedure was also discussed with the patient who is in agreement to proceed.   Risks and benefits discussed with the patient including bleeding, infection, damage to adjacent structures, bowel perforation/fistula connection, and sepsis.  All of the patient's questions were answered, patient is agreeable to proceed.  Consent signed and in chart.  Thank you for this interesting consult.  I greatly enjoyed meeting Laurie [name] and look forward to participating in their care.  A copy of this report was sent to the requesting provider on this date.  Electronically Signed: Alwyn Ren, AGACNP-BC [phone] 02/11/2021, 9:27 AM   I spent a total of 20 Minutes    in face to face in clinical consultation, greater than 50% of which was counseling/coordinating care for intra-abdominal fluid collection aspiration with drain placement.

## 2021-02-11 NOTE — Sedation Documentation (Signed)
Pt tolerated procedure very well. 26F JP drain to right abdomen, dressing CDI. Pt deies pain at this time.   Totals: Fentanyl 1.5 mg versed 

## 2021-02-11 NOTE — Sedation Documentation (Signed)
Vital signs stable. 

## 2021-02-11 NOTE — Procedures (Signed)
Interventional Radiology Procedure Note  Procedure: CT Guided Drainage of anterior peritoneal abscess  Complications: None  Estimated Blood Loss: < 10 mL  Findings: 10 Fr drain placed in anterior peritoneal fluid collection with return of purulent fluid. Fluid sample sent for culture analysis. Drain attached to suction bulb drainage.  Will follow.  Jodi Marble. Fredia Sorrow, M.D Pager:  [phone]

## 2021-02-11 NOTE — Sedation Documentation (Signed)
Patient is resting comfortably. No complaints at this time 

## 2021-02-11 NOTE — Progress Notes (Signed)
Laurie [name]  SFK:[id number] DOB: 20-Dec-1955 DOA: 02/06/2021 PCP: Margit Hanks, MD    Brief Narrative:  16CB w/ a hx of DM2, HTN, HLD, anxiety/depression, GERD, obesity, and multiple abdomino-pelvic surgeries who presented to the ED with 5 days of worsening lower abdominal pain associated with loose stools and vomiting. In the ED CT revealed an incarcerated ventral hernia. EDP attempted reduction though symptoms did not improve. Labs notable for acute renal failure in the setting of dehydration. She was transferred to [facility] ED for surgical consult, repeat CT abdomen showed persistent hernia with bowel obstruction for which she was taken to the OR.  Consultants:  General Surgery  Code Status: FULL CODE  Antimicrobials:  None  DVT prophylaxis: Heparin  Subjective: Afebrile.  Vital signs stable with exception to mildly elevated blood pressure.  Saturations 93% on room air.  WBC improved today.  Alert and conversant.  Mildly confused today.  Reports ongoing abdominal discomfort.  Assessment & Plan:  Incarcerated ventral hernia with SBO Status post repair with small bowel resection and LOA - ongoing care per General Surgery - NG tube remains in place - persisting abdominal discomfort and climbing WBC concerning - CT abdomen pelvis 11/4 noted questionable fluid collection within the abdomen - Surgery asking IR to drain fluid collection  Mild confusion Likely a component of sundowning/acute delirium with possible component of baseline cognitive deficit -continue low-dose nightly Seroquel - delirium precautions  Mild hypernatremia Corrected with increased free water in IV  Acute renal failure Worsened initially postop -baseline creatinine approximately 1.7 -creatinine dramatically improved 11/2 and remains stable   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 Vonita Moss at [facility] - history of cystectomy and right colon pouch for interstitial cystitis  1990s - pouch stone is found to be nonobstructing  DM2 CBG well controlled  HTN Gently adjust blood pressure medications and follow  COPD Continue usual Dulera and as needed albuterol - well compensated presently  GERD Continue usual home medical therapy  Depression Continue usual home medical therapy  HLD Statin on hold until oral intake more consistent  Obesity - Body mass index is 34.33 kg/m.   Family Communication: Spoke with husband at bedside Status is: Inpatient   Objective: Blood pressure (!) 158/75, pulse 89, temperature 98.6 F (37 C), temperature source Oral, resp. rate (!) 24, height 5\' 4"  (1.626 m), weight 90.7 kg, SpO2 93 %.  Intake/Output Summary (Last 24 hours) at 02/11/2021 0821 Last data filed at 02/11/2021 0631 Gross per 24 hour  Intake 1646.58 ml  Output 2100 ml  Net -453.42 ml    Filed Weights   02/06/21 1053  Weight: 90.7 kg    Examination: General: No acute respiratory distress Lungs: Clear to auscultation bilaterally -no wheezing Cardiovascular: RRR without murmur - Abdomen: Incision clean and dry, no rebound, tender to palpation diffusely, no mass Extremities: No significant edema bilateral lower extremities  CBC: Recent Labs  Lab 02/06/21 1155 02/07/21 0546 02/09/21 0443 02/10/21 0444 02/11/21 0513  WBC 11.1*   < > 24.3* 27.3* 21.3*  NEUTROABS 7.9*  --   --   --   --   HGB 13.8   < > 11.7* 11.7* 10.9*  HCT 40.9   < > 37.3 35.2* 34.7*  MCV 94.5   < > 101.6* 98.9 101.2*  PLT 507*   < > 376 435* 389   < > = values in this interval not displayed.    Basic Metabolic Panel: Recent Labs  Lab 02/09/21 0443 02/10/21 0444 02/11/21 0513  NA 144 146* 145  K 4.1 3.6 4.2  CL 114* 117* 116*  CO2 17* 15* 21*  GLUCOSE 96 144* 167*  BUN 19 18 17   CREATININE 1.04* 1.15* 0.99  CALCIUM 9.0 9.1 9.0  MG 1.8  --  1.9  PHOS  --   --  3.0    GFR: Estimated Creatinine Clearance: 61 mL/min (by C-G formula based on SCr of 0.99  mg/dL).  Liver Function Tests: Recent Labs  Lab 02/06/21 1315 02/07/21 0546 02/11/21 0513  AST 22 25 24   ALT 18 18 18   ALKPHOS 58 55 85  BILITOT 0.4 0.7 0.6  PROT 8.0 6.7 7.1  ALBUMIN 4.1 3.3* 2.7*    Recent Labs  Lab 02/06/21 1315  LIPASE 34     HbA1C: Hgb A1c MFr Bld  Date/Time Value Ref Range Status  02/07/2021 05:46 AM 6.2 (H) 4.8 - 5.6 % Final    Comment:    (NOTE) Pre diabetes:          5.7%-6.4%  Diabetes:              >6.4%  Glycemic control for   <7.0% adults with diabetes   06/25/2015 05:06 AM 6.2 (H) 4.8 - 5.6 % Final    Comment:    (NOTE)         Pre-diabetes: 5.7 - 6.4         Diabetes: >6.4         Glycemic control for adults with diabetes: <7.0     CBG: Recent Labs  Lab 02/10/21 1702 02/10/21 1941 02/11/21 0005 02/11/21 0412 02/11/21 0735  GLUCAP 139* 162* 149* 158* 178*     Recent Results (from the past 240 hour(s))  Resp Panel by RT-PCR (Flu A&B, Covid) Nasopharyngeal Swab     Status: None   Collection Time: 02/06/21 11:55 AM   Specimen: Nasopharyngeal Swab; Nasopharyngeal(NP) swabs in vial transport medium  Result Value Ref Range Status   SARS Coronavirus 2 by RT PCR NEGATIVE NEGATIVE Final    Comment: (NOTE) SARS-CoV-2 target nucleic acids are NOT DETECTED.  The SARS-CoV-2 RNA is generally detectable in upper respiratory specimens during the acute phase of infection. The lowest concentration of SARS-CoV-2 viral copies this assay can detect is 138 copies/mL. A negative result does not preclude SARS-Cov-2 infection and should not be used as the sole basis for treatment or other patient management decisions. A negative result may occur with  improper specimen collection/handling, submission of specimen other than nasopharyngeal swab, presence of viral mutation(s) within the areas targeted by this assay, and inadequate number of viral copies(<138 copies/mL). A negative result must be combined with clinical observations,  patient history, and epidemiological information. The expected result is Negative.  Fact Sheet for Patients:  13/05/22  Fact Sheet for Healthcare Providers:  13/05/22  This test is no t yet approved or cleared by the 02/08/21 FDA and  has been authorized for detection and/or diagnosis of SARS-CoV-2 by FDA under an Emergency Use Authorization (EUA). This EUA will remain  in effect (meaning this test can be used) for the duration of the COVID-19 declaration under Section 564(b)(1) of the Act, 21 U.S.C.section 360bbb-3(b)(1), unless the authorization is terminated  or revoked sooner.       Influenza A by PCR NEGATIVE NEGATIVE Final   Influenza B by PCR NEGATIVE NEGATIVE Final    Comment: (NOTE) The Xpert Xpress SARS-CoV-2/FLU/RSV plus assay is intended as  an aid in the diagnosis of influenza from Nasopharyngeal swab specimens and should not be used as a sole basis for treatment. Nasal washings and aspirates are unacceptable for Xpert Xpress SARS-CoV-2/FLU/RSV testing.  Fact Sheet for Patients: BloggerCourse.com  Fact Sheet for Healthcare Providers: SeriousBroker.it  This test is not yet approved or cleared by the [place] FDA and has been authorized for detection and/or diagnosis of SARS-CoV-2 by FDA under an Emergency Use Authorization (EUA). This EUA will remain in effect (meaning this test can be used) for the duration of the COVID-19 declaration under Section 564(b)(1) of the Act, 21 U.S.C. section 360bbb-3(b)(1), unless the authorization is terminated or revoked.  Performed at [facility], [address], [place], [place] [address]   MRSA Next Gen by PCR, Nasal     Status: None   Collection Time: 02/09/21  8:29 AM   Specimen: Nasal Mucosa; Nasal Swab  Result Value Ref Range Status   MRSA by PCR Next Gen NOT DETECTED NOT  DETECTED Final    Comment: (NOTE) The GeneXpert MRSA Assay (FDA approved for NASAL specimens only), is one component of a comprehensive MRSA colonization surveillance program. It is not intended to diagnose MRSA infection nor to guide or monitor treatment for MRSA infections. Test performance is not FDA approved in patients less than 2 years old. Performed at [facility], [address], [place], [place] [address]       Scheduled Meds:  heparin  5,000 Units Subcutaneous Q8H   insulin aspart  0-6 Units Subcutaneous Q4H   metoprolol tartrate  5 mg Intravenous Q8H   mometasone-formoterol  2 puff Inhalation BID   pantoprazole (PROTONIX) IV  40 mg Intravenous Q24H   QUEtiapine  12.5 mg Oral QHS   sodium chloride flush  3 mL Intravenous Q12H   Continuous Infusions:  dextrose 5 % and 0.45 % NaCl with KCl 20 mEq/L 75 mL/hr at 02/11/21 0030   methocarbamol (ROBAXIN) IV 500 mg (02/11/21 0525)     LOS: 4 days   Lonia Blood, MD [facility] Office  [phone] Pager - Text Page per Loretha Stapler  If 7PM-7AM, please contact night-coverage per Amion 02/11/2021, 8:21 AM

## 2021-02-11 NOTE — Sedation Documentation (Signed)
Images taken 

## 2021-02-12 DIAGNOSIS — R188 Other ascites: Secondary | ICD-10-CM | POA: Diagnosis not present

## 2021-02-12 DIAGNOSIS — N179 Acute kidney failure, unspecified: Secondary | ICD-10-CM | POA: Diagnosis not present

## 2021-02-12 DIAGNOSIS — K436 Other and unspecified ventral hernia with obstruction, without gangrene: Secondary | ICD-10-CM | POA: Diagnosis not present

## 2021-02-12 LAB — GLUCOSE, CAPILLARY
Glucose-Capillary: 135 mg/dL — ABNORMAL HIGH (ref 70–99)
Glucose-Capillary: 141 mg/dL — ABNORMAL HIGH (ref 70–99)
Glucose-Capillary: 146 mg/dL — ABNORMAL HIGH (ref 70–99)
Glucose-Capillary: 158 mg/dL — ABNORMAL HIGH (ref 70–99)
Glucose-Capillary: 164 mg/dL — ABNORMAL HIGH (ref 70–99)
Glucose-Capillary: 194 mg/dL — ABNORMAL HIGH (ref 70–99)
Glucose-Capillary: 225 mg/dL — ABNORMAL HIGH (ref 70–99)

## 2021-02-12 LAB — COMPREHENSIVE METABOLIC PANEL
ALT: 23 U/L (ref 0–44)
AST: 26 U/L (ref 15–41)
Albumin: 2.7 g/dL — ABNORMAL LOW (ref 3.5–5.0)
Alkaline Phosphatase: 90 U/L (ref 38–126)
Anion gap: 10 (ref 5–15)
BUN: 10 mg/dL (ref 8–23)
CO2: 21 mmol/L — ABNORMAL LOW (ref 22–32)
Calcium: 9.2 mg/dL (ref 8.9–10.3)
Chloride: 114 mmol/L — ABNORMAL HIGH (ref 98–111)
Creatinine, Ser: 0.7 mg/dL (ref 0.44–1.00)
GFR, Estimated: >60 mL/min (ref >60)
Glucose, Bld: 153 mg/dL — ABNORMAL HIGH (ref 70–99)
Potassium: 4 mmol/L (ref 3.5–5.1)
Sodium: 145 mmol/L (ref 135–145)
Total Bilirubin: 0.7 mg/dL (ref 0.3–1.2)
Total Protein: 6.9 g/dL (ref 6.5–8.1)

## 2021-02-12 LAB — CBC
HCT: 37.1 % (ref 36.0–46.0)
Hemoglobin: 11.2 g/dL — ABNORMAL LOW (ref 12.0–15.0)
MCH: 32 pg (ref 26.0–34.0)
MCHC: 30.2 g/dL (ref 30.0–36.0)
MCV: 106 fL — ABNORMAL HIGH (ref 80.0–100.0)
Platelets: 343 10*3/uL (ref 150–400)
RBC: 3.5 MIL/uL — ABNORMAL LOW (ref 3.87–5.11)
RDW: 14.8 % (ref 11.5–15.5)
WBC: 18 10*3/uL — ABNORMAL HIGH (ref 4.0–10.5)
nRBC: 0.2 % (ref 0.0–0.2)

## 2021-02-12 LAB — PHOSPHORUS: Phosphorus: 3.4 mg/dL (ref 2.5–4.6)

## 2021-02-12 LAB — MAGNESIUM: Magnesium: 1.6 mg/dL — ABNORMAL LOW (ref 1.7–2.4)

## 2021-02-12 MED ORDER — MAGNESIUM SULFATE 2 GM/50ML IV SOLN
2.0000 g | Freq: Once | INTRAVENOUS | Status: AC
  Administered 2021-02-12: 2 g via INTRAVENOUS
  Filled 2021-02-12: qty 50

## 2021-02-12 MED ORDER — SODIUM CHLORIDE 0.9% FLUSH
5.0000 mL | Freq: Three times a day (TID) | INTRAVENOUS | Status: DC
  Administered 2021-02-12 – 2021-02-16 (×12): 5 mL

## 2021-02-12 MED ORDER — PIPERACILLIN-TAZOBACTAM 3.375 G IVPB
3.3750 g | Freq: Three times a day (TID) | INTRAVENOUS | Status: AC
  Administered 2021-02-12 – 2021-02-26 (×42): 3.375 g via INTRAVENOUS
  Filled 2021-02-12 (×45): qty 50

## 2021-02-12 NOTE — Progress Notes (Signed)
Progress Note  6 Days Post-Op  Subjective: Drain placed yesterday.  Gram stain polymicrobial.  Had some flatus and tolerated clamping of NGT.   Objective: Vital signs in last 24 hours: Temp:  [97.8 F (36.6 C)-98.5 F (36.9 C)] 98.5 F (36.9 C) (11/06 0639) Pulse Rate:  [77-89] 77 (11/06 0639) Resp:  [16-24] 20 (11/06 0639) BP: (141-156)/(64-83) 156/83 (11/06 0639) SpO2:  [93 %-100 %] 98 % (11/06 0639) Last BM Date: 02/05/21  Intake/Output from previous day: 11/05 0701 - 11/06 0700 In: 30 [NG/GT:30] Out: 3505 [Urine:3275; Emesis/NG output:10; Drains:220] Intake/Output this shift: No intake/output data recorded.  PE: General: pleasant, WD, obese female who is laying in bed in NAD Heart: regular, rate, and rhythm.   Lungs:  Respiratory effort nonlabored Abd: soft, mildly tender at incision and drain site.  Drain purulent.   Psych: A&Ox3 with an appropriate affect.    Lab Results:  Recent Labs    02/11/21 0513 02/12/21 0532  WBC 21.3* 18.0*  HGB 10.9* 11.2*  HCT 34.7* 37.1  PLT 389 343   BMET Recent Labs    02/11/21 0513 02/12/21 0532  NA 145 145  K 4.2 4.0  CL 116* 114*  CO2 21* 21*  GLUCOSE 167* 153*  BUN 17 10  CREATININE 0.99 0.70  CALCIUM 9.0 9.2   PT/INR No results for input(s): LABPROT, INR in the last 72 hours. CMP     Component Value Date/Time   NA 145 02/12/2021 0532   K 4.0 02/12/2021 0532   CL 114 (H) 02/12/2021 0532   CO2 21 (L) 02/12/2021 0532   GLUCOSE 153 (H) 02/12/2021 0532   BUN 10 02/12/2021 0532   CREATININE 0.70 02/12/2021 0532   CALCIUM 9.2 02/12/2021 0532   PROT 6.9 02/12/2021 0532   ALBUMIN 2.7 (L) 02/12/2021 0532   AST 26 02/12/2021 0532   ALT 23 02/12/2021 0532   ALKPHOS 90 02/12/2021 0532   BILITOT 0.7 02/12/2021 0532   GFRNONAA >60 02/12/2021 0532   GFRAA 33 (L) 07/01/2015 0352   Lipase     Component Value Date/Time   LIPASE 34 02/06/2021 1315       Studies/Results: CT ABDOMEN PELVIS W  CONTRAST  Result Date: 02/10/2021 CLINICAL DATA:  Postoperative umbilical hernia repair. Abdominal pain and fever with leukocytosis. EXAM: CT ABDOMEN AND PELVIS WITH CONTRAST TECHNIQUE: Multidetector CT imaging of the abdomen and pelvis was performed using the standard protocol following bolus administration of intravenous contrast. CONTRAST:  7mL OMNIPAQUE IOHEXOL 350 MG/ML SOLN COMPARISON:  CT abdomen and pelvis 02/06/2021 FINDINGS: Lower chest: There is minimal atelectasis in the right lung base. Hepatobiliary: No focal liver abnormality is seen. Status post cholecystectomy. No biliary dilatation. Pancreas: Unremarkable. No pancreatic ductal dilatation or surrounding inflammatory changes. Spleen: Normal in size without focal abnormality. Adrenals/Urinary Tract: There is bilateral adrenal thickening similar to the prior examination. The bilateral kidneys are within normal limits. Patient is status post cystectomy. There is a right lower quadrant urinary diversion which is distended with fluid. There is a stable 14 mm calcification within the urinary diversion. Stomach/Bowel: Partial right colectomy changes are again seen. There is sigmoid colon diverticulosis without evidence for diverticulitis. There are mildly dilated loops of small bowel proximally, but distal small bowel loops appear nondilated. Oral contrast is seen to reach distal small bowel. Patient is status post umbilical hernia repair. There is an abnormal loop of small bowel at the level of the umbilicus demonstrating wall thickening with a portion of this  bowel approximating the anterior abdominal wall (images 2/48 through 54). There is no pneumatosis. There is mesenteric edema and fluid adjacent to small bowel loops at the surgical level. Nasogastric tube tip is in the distal stomach. The stomach is nondilated. Vascular/Lymphatic: Aortic atherosclerosis. No enlarged abdominal or pelvic lymph nodes. Reproductive: Status post hysterectomy. No  adnexal masses. Other: Patient is status post umbilical hernia repair. There is no evidence for recurrent hernia. There is a small amount of subcutaneous fluid and air at the level of the umbilicus compatible with recent surgery. There is also some skin thickening and edema of the left anterior abdominal wall, nonspecific. Subcutaneous air in the left abdominal wall may be related to medication injection sites. There are intraperitoneal fluid pockets containing a small amount of air approximating small-bowel loops in the anterior abdominal wall. The largest pocket is seen along the anterior and midline abdominal wall at the level of the umbilicus measuring 11 x 21 by 10 cm. There is no definitive wall enhancement. There also a few small foci of free intraperitoneal air in the anterior upper abdomen. Musculoskeletal: No acute or significant osseous findings. IMPRESSION: 1. Status post umbilical hernia repair. There is a small amount of free air in the upper abdomen. 2. Short segment small bowel wall thickening with associated mesenteric edema in the anterior abdomen at the surgical level worrisome for nonspecific enteritis. 3. Intraperitoneal fluid collection containing air along the anterior abdominal wall approximating inflamed small bowel loops. Differential diagnosis includes abscess or other postoperative fluid collection. Small bowel perforation or fistula cannot be excluded given that there are inflamed small bowel loops at this level. 4. Nasogastric tube tip in the distal stomach. 5. Urinary diversion appears uncomplicated. No hydronephrosis. Stable calculus in the urinary diversion. 6. Sigmoid colon diverticulosis. 7.  Aortic Atherosclerosis (ICD10-I70.0). Electronically Signed   By: Darliss Cheney M.D.   On: 02/10/2021 15:25   CT IMAGE GUIDED DRAINAGE BY PERCUTANEOUS CATHETER  Result Date: 02/11/2021 CLINICAL DATA:  Status post repair of incarcerated ventral hernia with development of postoperative fluid  collection of the anterior peritoneal cavity immediately deep to the abdominal wall. EXAM: CT GUIDED CATHETER DRAINAGE OF PERITONEAL ABSCESS ANESTHESIA/SEDATION: Moderate (conscious) sedation was employed during this procedure. A total of Versed 1.5 mg and Fentanyl 75 mcg was administered intravenously by radiology nursing under my supervision. Moderate Sedation Time: 42 minutes. The patient's level of consciousness and vital signs were monitored continuously by radiology nursing throughout the procedure under my direct supervision. PROCEDURE: The procedure, risks, benefits, and alternatives were explained to the patient. Questions regarding the procedure were encouraged and answered. The patient understands and consents to the procedure. A time out was performed prior to initiating the procedure. The abdominal wall was prepped with chlorhexidine in a sterile fashion, and a sterile drape was applied covering the operative field. A sterile gown and sterile gloves were used for the procedure. Local anesthesia was provided with 1% Lidocaine. Under CT guidance, an 18 gauge trocar needle was advanced into an anterior peritoneal fluid collection from a right-sided anterior approach. After confirming needle tip position and aspiration of fluid via the needle, a guidewire was advanced and the needle removed. The tract was dilated and a 10 French percutaneous drainage catheter placed. The catheter was formed and attached to a suction bulb. Additional CT was performed. A fluid sample was sent for culture analysis. The catheter was secured at the skin with a Prolene retention suture and adhesive StatLock device. COMPLICATIONS: None FINDINGS: Aspiration  at the level of the anterior peritoneal fluid collection just to the right of midline yielded purulent fluid. There was good return of fluid after drain placement. IMPRESSION: CT-guided percutaneous catheter drainage of anterior peritoneal abscess yielding purulent fluid. A  fluid sample was sent for culture analysis. A 10 French drain was placed and attached to suction bulb drainage. Electronically Signed   By: Irish Lack M.D.   On: 02/11/2021 14:18    Anti-infectives: Anti-infectives (From admission, onward)    Start     Dose/Rate Route Frequency Ordered Stop   02/06/21 2003  ceFAZolin (ANCEF) 2-4 GM/100ML-% IVPB       Note to Pharmacy: Ponciano Ort   : cabinet override      02/06/21 2003 02/06/21 2046   02/06/21 1945  ceFAZolin (ANCEF) IVPB 2g/100 mL premix        2 g 200 mL/hr over 30 Minutes Intravenous On call to O.R. 02/06/21 1914 02/06/21 2028        Assessment/Plan Ventral hernia with SBO POD#6 S/P ventral hernia repair with small bowel resection, LOA 10/31 Dr. Sheliah Hatch - d/c ngt - PT/OT, encourage mobilization - IV tylenol and prn dilaudid for pain control, scheduled IV robaxin  - wound vac MWF - clears.  -WBC coming down. Culture from drain pending.  Add antibiotics.    FEN: NPO, IVFper med, NGT to LIWS VTE: SQH ID: Ancef pre-op, zosyn 11/6>>   Hx of cystectomy with urostomy in place - large stone noted in ileal conduit on CT, OP follow up with [facility] recommended  AKI on CKD - Cr improving T2DM with gastroparesis - SSI HTN Chronic candidal esophagitis  GERD Hx of Hep C Glaucoma  B12 deficiency and iron deficiency anemia  CAD Hx of asthma    LOS: 5 days    Almond Lint, MD [facility] 02/12/2021, 9:15 AM Please see Amion for pager number during day hours 7:00am-4:30pm

## 2021-02-12 NOTE — Progress Notes (Signed)
Referring Physician(s): Carl Best, PA-C  Supervising Physician: Irish Lack  Patient Status:  [facility] - In-pt  Chief Complaint: Incarcerated ventral hernia s/p lysis of adhesions, small bowel resection with anastomosis and incisional hernia repair 02/06/21; post-op intra-abdominal fluid collection s/p drain placement by Dr. Fredia Sorrow 02/11/21  Subjective: Patient in bed awake/alert. RN placing new IV. Patient has been started on clear liquid diet and is tolerating. She states she feels a lot better.   Allergies: Buchu-cornsilk-ch grass-hydran, Celebrex [celecoxib], Ciprofloxacin, Cymbalta [duloxetine hcl], Dilaudid [hydromorphone hcl], Fentanyl and related, Glucophage [metformin hcl], Oxycodone, Pregabalin, Hydrocodone-acetaminophen, Morphine and related, Sulfa antibiotics, and Vicodin [hydrocodone-acetaminophen]  Medications: Prior to Admission medications   Medication Sig Start Date End Date Taking? Authorizing Provider  albuterol (PROVENTIL HFA;VENTOLIN HFA) 108 (90 BASE) MCG/ACT inhaler Inhale 2 puffs into the lungs every 4 (four) hours as needed for wheezing or shortness of breath.   Yes [provider]  amLODipine (NORVASC) 10 MG tablet Take 10 mg by mouth daily.    Yes [provider]  aspirin EC 81 MG tablet Take 81 mg by mouth daily.   Yes [provider]  budesonide-formoterol (SYMBICORT) 80-4.5 MCG/ACT inhaler Inhale 2 puffs into the lungs 2 (two) times daily.   Yes [provider]  cholecalciferol (VITAMIN D) 1000 UNITS tablet Take 1,000 Units by mouth daily.   Yes [provider]  cyanocobalamin 1000 MCG tablet Take 1,000 mcg by mouth daily.    Yes [provider]  dicyclomine (BENTYL) 20 MG tablet Take 20 mg 2 (two) times daily by mouth. 08/30/16 02/07/21 Yes [provider]  FLUoxetine (PROZAC) 20 MG capsule Take 20 mg by mouth daily.    Yes [provider]  fluticasone (FLONASE) 50 MCG/ACT nasal  spray Place 2 sprays into both nostrils daily as needed for allergies or rhinitis.   Yes [provider]  glucagon 1 MG injection Inject 1 mg into the vein once as needed (severe insulin reaction).   Yes [provider]  hydrocortisone (ANUSOL-HC) 2.5 % rectal cream Place 1 application rectally 3 (three) times daily as needed for hemorrhoids.    Yes [provider]  insulin glargine (LANTUS) 100 UNIT/ML injection Inject 50 Units into the skin at bedtime.   Yes [provider]  isosorbide mononitrate (IMDUR) 60 MG 24 hr tablet Take 60 mg by mouth daily.   Yes [provider]  lidocaine (XYLOCAINE) 5 % ointment Apply 1 application topically 4 (four) times daily as needed for moderate pain.   Yes [provider]  LIRAGLUTIDE [place] Inject 1.8 mg into the skin daily.   Yes [provider]  lisinopril (ZESTRIL) 5 MG tablet Take 5 mg by mouth daily. 08/02/16  Yes [provider]  loperamide (IMODIUM) 2 MG capsule Take 4 mg by mouth daily as needed for diarrhea or loose stools.   Yes [provider]  magnesium oxide (MAG-OX) 400 MG tablet Take 400 mg by mouth 2 (two) times daily.   Yes [provider]  metFORMIN (GLUCOPHAGE-XR) 500 MG 24 hr tablet Take 500 mg by mouth daily with breakfast.   Yes [provider]  metoprolol succinate (TOPROL-XL) 100 MG 24 hr tablet Take 100 mg by mouth daily. Take with or immediately following a meal.   Yes [provider]  Multiple Vitamin (MULTIVITAMIN WITH MINERALS) TABS tablet Take 1 tablet by mouth daily.   Yes [provider]  nitroGLYCERIN (NITROSTAT) 0.4 MG SL tablet Place 0.4 mg  under the tongue every 5 (five) minutes as needed for chest pain.   Yes [provider]  Nitroglycerin 0.4 % OINT Place 1 application rectally 2 (two) times daily as needed (for chest pain.).    Yes [provider]  pantoprazole (PROTONIX) 20 MG tablet Take 20 mg  by mouth daily.   Yes [provider]  promethazine (PHENERGAN) 25 MG tablet Take 25 mg by mouth every 6 (six) hours as needed for nausea or vomiting.   Yes [provider]  ranolazine (RANEXA) 500 MG 12 hr tablet Take 500 mg by mouth 2 (two) times daily.   Yes [provider]  rosuvastatin (CRESTOR) 10 MG tablet Take 10 mg by mouth daily.   Yes [provider]  temazepam (RESTORIL) 7.5 MG capsule Take 7.5 mg by mouth at bedtime as needed for sleep.   Yes [provider]  traMADol (ULTRAM) 50 MG tablet Take 1 tablet (50 mg total) every 6 (six) hours as needed by mouth. 02/15/17  Yes Bethel Born, PA-C  traZODone (DESYREL) 50 MG tablet Take 50 mg by mouth at bedtime.   Yes [provider]  triamcinolone cream (KENALOG) 0.5 % Apply 1 application topically 2 (two) times daily as needed (for skin irritation).    Yes [provider]  gabapentin (NEURONTIN) 300 MG capsule Take 600 mg by mouth 2 (two) times daily.    [provider]     Vital Signs: BP (!) 156/83 (BP Location: Left Arm)   Pulse 77   Temp 98.5 F (36.9 C) (Oral)   Resp 20   Ht 5\' 4"  (1.626 m)   Wt 200 lb (90.7 kg)   SpO2 98%   BMI 34.33 kg/m   Physical Exam Constitutional:      General: She is not in acute distress. Abdominal:     Comments: Right medial drain to suction. 50 ml of purulent fluid in bulb. Drain easily flushed/aspirated. Dressing is clean and dry.   Skin:    General: Skin is warm and dry.  Neurological:     Mental Status: She is alert and oriented to person, place, and time.    Imaging: CT ABDOMEN PELVIS W CONTRAST  Result Date: 02/10/2021 CLINICAL DATA:  Postoperative umbilical hernia repair. Abdominal pain and fever with leukocytosis. EXAM: CT ABDOMEN AND PELVIS WITH CONTRAST TECHNIQUE: Multidetector CT imaging of the abdomen and pelvis was performed using the standard protocol following bolus administration of intravenous  contrast. CONTRAST:  68mL OMNIPAQUE IOHEXOL 350 MG/ML SOLN COMPARISON:  CT abdomen and pelvis 02/06/2021 FINDINGS: Lower chest: There is minimal atelectasis in the right lung base. Hepatobiliary: No focal liver abnormality is seen. Status post cholecystectomy. No biliary dilatation. Pancreas: Unremarkable. No pancreatic ductal dilatation or surrounding inflammatory changes. Spleen: Normal in size without focal abnormality. Adrenals/Urinary Tract: There is bilateral adrenal thickening similar to the prior examination. The bilateral kidneys are within normal limits. Patient is status post cystectomy. There is a right lower quadrant urinary diversion which is distended with fluid. There is a stable 14 mm calcification within the urinary diversion. Stomach/Bowel: Partial right colectomy changes are again seen. There is sigmoid colon diverticulosis without evidence for diverticulitis. There are mildly dilated loops of small bowel proximally, but distal small bowel loops appear nondilated. Oral contrast is seen to reach distal small bowel. Patient is status post umbilical hernia repair. There is an abnormal loop of small bowel at the level of the umbilicus demonstrating wall thickening with a portion  of this bowel approximating the anterior abdominal wall (images 2/48 through 54). There is no pneumatosis. There is mesenteric edema and fluid adjacent to small bowel loops at the surgical level. Nasogastric tube tip is in the distal stomach. The stomach is nondilated. Vascular/Lymphatic: Aortic atherosclerosis. No enlarged abdominal or pelvic lymph nodes. Reproductive: Status post hysterectomy. No adnexal masses. Other: Patient is status post umbilical hernia repair. There is no evidence for recurrent hernia. There is a small amount of subcutaneous fluid and air at the level of the umbilicus compatible with recent surgery. There is also some skin thickening and edema of the left anterior abdominal wall, nonspecific.  Subcutaneous air in the left abdominal wall may be related to medication injection sites. There are intraperitoneal fluid pockets containing a small amount of air approximating small-bowel loops in the anterior abdominal wall. The largest pocket is seen along the anterior and midline abdominal wall at the level of the umbilicus measuring 11 x 21 by 10 cm. There is no definitive wall enhancement. There also a few small foci of free intraperitoneal air in the anterior upper abdomen. Musculoskeletal: No acute or significant osseous findings. IMPRESSION: 1. Status post umbilical hernia repair. There is a small amount of free air in the upper abdomen. 2. Short segment small bowel wall thickening with associated mesenteric edema in the anterior abdomen at the surgical level worrisome for nonspecific enteritis. 3. Intraperitoneal fluid collection containing air along the anterior abdominal wall approximating inflamed small bowel loops. Differential diagnosis includes abscess or other postoperative fluid collection. Small bowel perforation or fistula cannot be excluded given that there are inflamed small bowel loops at this level. 4. Nasogastric tube tip in the distal stomach. 5. Urinary diversion appears uncomplicated. No hydronephrosis. Stable calculus in the urinary diversion. 6. Sigmoid colon diverticulosis. 7.  Aortic Atherosclerosis (ICD10-I70.0). Electronically Signed   By: Darliss Cheney M.D.   On: 02/10/2021 15:25   CT IMAGE GUIDED DRAINAGE BY PERCUTANEOUS CATHETER  Result Date: 02/11/2021 CLINICAL DATA:  Status post repair of incarcerated ventral hernia with development of postoperative fluid collection of the anterior peritoneal cavity immediately deep to the abdominal wall. EXAM: CT GUIDED CATHETER DRAINAGE OF PERITONEAL ABSCESS ANESTHESIA/SEDATION: Moderate (conscious) sedation was employed during this procedure. A total of Versed 1.5 mg and Fentanyl 75 mcg was administered intravenously by radiology nursing  under my supervision. Moderate Sedation Time: 42 minutes. The patient's level of consciousness and vital signs were monitored continuously by radiology nursing throughout the procedure under my direct supervision. PROCEDURE: The procedure, risks, benefits, and alternatives were explained to the patient. Questions regarding the procedure were encouraged and answered. The patient understands and consents to the procedure. A time out was performed prior to initiating the procedure. The abdominal wall was prepped with chlorhexidine in a sterile fashion, and a sterile drape was applied covering the operative field. A sterile gown and sterile gloves were used for the procedure. Local anesthesia was provided with 1% Lidocaine. Under CT guidance, an 18 gauge trocar needle was advanced into an anterior peritoneal fluid collection from a right-sided anterior approach. After confirming needle tip position and aspiration of fluid via the needle, a guidewire was advanced and the needle removed. The tract was dilated and a 10 French percutaneous drainage catheter placed. The catheter was formed and attached to a suction bulb. Additional CT was performed. A fluid sample was sent for culture analysis. The catheter was secured at the skin with a Prolene retention suture and adhesive StatLock device. COMPLICATIONS: None  FINDINGS: Aspiration at the level of the anterior peritoneal fluid collection just to the right of midline yielded purulent fluid. There was good return of fluid after drain placement. IMPRESSION: CT-guided percutaneous catheter drainage of anterior peritoneal abscess yielding purulent fluid. A fluid sample was sent for culture analysis. A 10 French drain was placed and attached to suction bulb drainage. Electronically Signed   By: Irish Lack M.D.   On: 02/11/2021 14:18    Labs:  CBC: Recent Labs    02/09/21 0443 02/10/21 0444 02/11/21 0513 02/12/21 0532  WBC 24.3* 27.3* 21.3* 18.0*  HGB 11.7* 11.7*  10.9* 11.2*  HCT 37.3 35.2* 34.7* 37.1  PLT 376 435* 389 343    COAGS: No results for input(s): INR, APTT in the last 8760 hours.  BMP: Recent Labs    02/09/21 0443 02/10/21 0444 02/11/21 0513 02/12/21 0532  NA 144 146* 145 145  K 4.1 3.6 4.2 4.0  CL 114* 117* 116* 114*  CO2 17* 15* 21* 21*  GLUCOSE 96 144* 167* 153*  BUN 19 18 17 10   CALCIUM 9.0 9.1 9.0 9.2  CREATININE 1.04* 1.15* 0.99 0.70  GFRNONAA 59* 53* >60 >60    LIVER FUNCTION TESTS: Recent Labs    02/06/21 1315 02/07/21 0546 02/11/21 0513 02/12/21 0532  BILITOT 0.4 0.7 0.6 0.7  AST 22 25 24 26   ALT 18 18 18 23   ALKPHOS 58 55 85 90  PROT 8.0 6.7 7.1 6.9  ALBUMIN 4.1 3.3* 2.7* 2.7*    Assessment and Plan:  Incarcerated ventral hernia s/p lysis of adhesions, small bowel resection with anastomosis and incisional hernia repair 02/06/21; post-op intra-abdominal fluid collection s/p drain placement in IR 02/11/21   Drain Location: right medial abdomen Size: Fr size: 10 Fr Date of placement: 01/11/21  Currently to: Drain collection device: suction bulb 24 hour output:  Output by Drain (mL) 02/10/21 0701 - 02/10/21 1900 02/10/21 1901 - 02/11/21 0700 02/11/21 0701 - 02/11/21 1900 02/11/21 1901 - 02/12/21 0700 02/12/21 0701 - 02/12/21 1340  Closed System Drain 1 Right RUQ Bulb (JP) 10 Fr.   75 125   Negative Pressure Wound Therapy Abdomen Mid 0 0  20     Interval imaging/drain manipulation:    Current examination: Flushes/aspirates easily.  Insertion site unremarkable. Suture and stat lock in place. Dressed appropriately.   Plan: Continue TID flushes with 5 cc NS. Record output Q shift. Dressing changes QD or PRN if soiled.  Call IR APP or on call IR MD if difficulty flushing or sudden change in drain output.  Repeat imaging/possible drain injection once output < 10 mL/QD (excluding flush material.)  Discharge planning: Please contact IR APP or on call IR MD prior to patient d/c to ensure  appropriate follow up plans are in place. Typically patient will follow up with IR clinic 10-14 days post d/c for repeat imaging/possible drain injection. IR scheduler will contact patient with date/time of appointment. Patient will need to flush drain QD with 5 cc NS, record output QD, dressing changes every 2-3 days or earlier if soiled.   IR will continue to follow - please call with questions or concerns.   Electronically Signed: Alwyn Ren, AGACNP-BC [phone] 02/12/2021, 1:26 PM   I spent a total of 15 Minutes at the the patient's bedside AND on the patient's hospital floor or unit, greater than 50% of which was counseling/coordinating care for intra-abdominal abscess drain.

## 2021-02-12 NOTE — Progress Notes (Addendum)
Laurie [name]  YIF:[id number] DOB: Jun 04, 1955 DOA: 02/06/2021 PCP: Margit Hanks, MD    Brief Narrative:  85VE w/ a hx of DM2, HTN, HLD, anxiety/depression, GERD, obesity, and multiple abdomino-pelvic surgeries who presented to the ED with 5 days of worsening lower abdominal pain associated with loose stools and vomiting. In the ED CT revealed an incarcerated ventral hernia. EDP attempted reduction though symptoms did not improve. Labs notable for acute renal failure in the setting of dehydration. She was transferred to [facility] ED for surgical consult, and repeat CT abdomen showed persistent hernia with bowel obstruction for which she was taken to the OR.  Significant events: 10/31 ventral hernia repair with small bowel resection and LOA 11/5 CT-guided drainage of anterior peritoneal abscess in IR -10 French drain left in place none  Consultants:  General Surgery IR  Code Status: FULL CODE  Antimicrobials:  Zosyn 11/6 >  DVT prophylaxis: Heparin  Subjective: Patient underwent CT-guided drainage of anterior peritoneal abscess 11/5 in IR.  Appears to have tolerated procedure well.  Vital signs stable.  Afebrile.  WBC trending downward.  In good spirits today and more alert.  Very much wants to be allowed to eat.  No chest pain or shortness of breath.  Reports significant improvement in abdominal pain.  Assessment & Plan:  Incarcerated ventral hernia with SBO Status post repair with small bowel resection and LOA 10/31 - ongoing care per General Surgery - NG tube remains in place - persisting abdominal discomfort and climbing WBC raised concern - CT abdomen pelvis 11/4 noted questionable fluid collection within the abdomen   Anterior peirtoneal abscess Drained in IR 11/6 w/ drain left in place - follow cultures   Mild confusion Likely a component of sundowning/acute delirium with possible component of baseline cognitive deficit - continue low-dose nightly Seroquel - delirium  precautions  Mild hypernatremia Corrected with increased free water in IV  Hypomagnesemia Due to lack of intake -supplement and follow  Acute renal failure Worsened initially postop -baseline creatinine approximately 1.7 -creatinine dramatically improved 11/2 and remains stable within normal range  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   Family Communication: Spoke with patient and family at bedside Status is: Inpatient   Objective: Blood pressure (!) 156/83, pulse 77, temperature 98.5 F (36.9 C), temperature source Oral, resp. rate 20, height 5\' 4"  (1.626 m), weight 90.7 kg, SpO2 98 %.  Intake/Output Summary (Last 24 hours) at 02/12/2021 0914 Last data filed at 02/12/2021 0600 Gross per 24 hour  Intake 30 ml  Output 3505 ml  Net -3475 ml    Filed Weights   02/06/21 1053  Weight: 90.7 kg    Examination: General: No acute respiratory distress Lungs: Clear to auscultation bilaterally Cardiovascular: RRR  Abdomen: Incision clean and dry, no rebound, tender to palpation diffusely, no mass Extremities: No significant edema bilateral lower extremities  CBC: Recent Labs  Lab 02/06/21 1155 02/07/21 0546 02/10/21 0444 02/11/21 0513 02/12/21 0532  WBC 11.1*   < >  27.3* 21.3* 18.0*  NEUTROABS 7.9*  --   --   --   --   HGB 13.8   < > 11.7* 10.9* 11.2*  HCT 40.9   < > 35.2* 34.7* 37.1  MCV 94.5   < > 98.9 101.2* 106.0*  PLT 507*   < > 435* 389 343   < >  = values in this interval not displayed.    Basic Metabolic Panel: Recent Labs  Lab 02/09/21 0443 02/10/21 0444 02/11/21 0513 02/12/21 0532  NA 144 146* 145 145  K 4.1 3.6 4.2 4.0  CL 114* 117* 116* 114*  CO2 17* 15* 21* 21*  GLUCOSE 96 144* 167* 153*  BUN 19 18 17 10   CREATININE 1.04* 1.15* 0.99 0.70  CALCIUM 9.0 9.1 9.0 9.2  MG 1.8  --  1.9 1.6*  PHOS  --   --  3.0 3.4    GFR: Estimated Creatinine Clearance: 75.5 mL/min (by C-G formula based on SCr of 0.7 mg/dL).  Liver Function Tests: Recent Labs  Lab 02/06/21 1315 02/07/21 0546 02/11/21 0513 02/12/21 0532  AST 22 25 24 26   ALT 18 18 18 23   ALKPHOS 58 55 85 90  BILITOT 0.4 0.7 0.6 0.7  PROT 8.0 6.7 7.1 6.9  ALBUMIN 4.1 3.3* 2.7* 2.7*    Recent Labs  Lab 02/06/21 1315  LIPASE 34     HbA1C: Hgb A1c MFr Bld  Date/Time Value Ref Range Status  02/07/2021 05:46 AM 6.2 (H) 4.8 - 5.6 % Final    Comment:    (NOTE) Pre diabetes:          5.7%-6.4%  Diabetes:              >6.4%  Glycemic control for   <7.0% adults with diabetes   06/25/2015 05:06 AM 6.2 (H) 4.8 - 5.6 % Final    Comment:    (NOTE)         Pre-diabetes: 5.7 - 6.4         Diabetes: >6.4         Glycemic control for adults with diabetes: <7.0     CBG: Recent Labs  Lab 02/11/21 1657 02/11/21 2018 02/12/21 0020 02/12/21 0338 02/12/21 0748  GLUCAP 158* 147* 164* 146* 158*     Recent Results (from the past 240 hour(s))  Resp Panel by RT-PCR (Flu A&B, Covid) Nasopharyngeal Swab     Status: None   Collection Time: 02/06/21 11:55 AM   Specimen: Nasopharyngeal Swab; Nasopharyngeal(NP) swabs in vial transport medium  Result Value Ref Range Status   SARS Coronavirus 2 by RT PCR NEGATIVE NEGATIVE Final    Comment: (NOTE) SARS-CoV-2 target nucleic acids are NOT DETECTED.  The SARS-CoV-2 RNA is generally detectable in upper respiratory specimens during the acute phase of infection. The lowest concentration of SARS-CoV-2 viral copies  this assay can detect is 138 copies/mL. A negative result does not preclude SARS-Cov-2 infection and should not be used as the sole basis for treatment or other patient management decisions. A negative result may occur with  improper specimen collection/handling, submission of specimen other than nasopharyngeal swab, presence of viral mutation(s) within the areas targeted by this assay, and inadequate number of viral copies(<138 copies/mL). A negative result must be combined with clinical observations, patient history, and epidemiological information. The expected result is Negative.  Fact Sheet for Patients:  13/06/22  Fact Sheet for Healthcare Providers:  13/06/22  This test is no t yet approved or cleared  by the [place] and  has been authorized for detection and/or diagnosis of SARS-CoV-2 by FDA under an Emergency Use Authorization (EUA). This EUA will remain  in effect (meaning this test can be used) for the duration of the COVID-19 declaration under Section 564(b)(1) of the Act, 21 U.S.C.section 360bbb-3(b)(1), unless the authorization is terminated  or revoked sooner.       Influenza A by PCR NEGATIVE NEGATIVE Final   Influenza B by PCR NEGATIVE NEGATIVE Final    Comment: (NOTE) The Xpert Xpress SARS-CoV-2/FLU/RSV plus assay is intended as an aid in the diagnosis of influenza from Nasopharyngeal swab specimens and should not be used as a sole basis for treatment. Nasal washings and aspirates are unacceptable for Xpert Xpress SARS-CoV-2/FLU/RSV testing.  Fact Sheet for Patients: BloggerCourse.com  Fact Sheet for Healthcare Providers: SeriousBroker.it  This test is not yet approved or cleared by the [place] FDA and has been authorized for detection and/or diagnosis of SARS-CoV-2 by FDA under an Emergency Use Authorization (EUA). This EUA  will remain in effect (meaning this test can be used) for the duration of the COVID-19 declaration under Section 564(b)(1) of the Act, 21 U.S.C. section 360bbb-3(b)(1), unless the authorization is terminated or revoked.  Performed at [facility], [address] [place], [place] [address]   MRSA Next Gen by PCR, Nasal     Status: None   Collection Time: 02/09/21  8:29 AM   Specimen: Nasal Mucosa; Nasal Swab  Result Value Ref Range Status   MRSA by PCR Next Gen NOT DETECTED NOT DETECTED Final    Comment: (NOTE) The GeneXpert MRSA Assay (FDA approved for NASAL specimens only), is one component of a comprehensive MRSA colonization surveillance program. It is not intended to diagnose MRSA infection nor to guide or monitor treatment for MRSA infections. Test performance is not FDA approved in patients less than 66 years old. Performed at [facility], [address], [place], [place] [address]   Aerobic/Anaerobic Culture w Gram Stain (surgical/deep wound)     Status: None (Preliminary result)   Collection Time: 02/11/21  1:56 PM   Specimen: Abscess  Result Value Ref Range Status   Specimen Description   Final    ABSCESS Performed at [facility], [address] [place], [place] [address]    Special Requests   Final    Normal Performed at [facility], [address] [place], [place] [address]    Gram Stain   Final    NO SQUAMOUS EPITHELIAL CELLS SEEN ABUNDANT WBC SEEN MODERATE GRAM NEGATIVE RODS MODERATE GRAM POSITIVE COCCI Performed at [facility], [address] [place], [place] [address]    Culture PENDING  Incomplete   Report Status PENDING  Incomplete      Scheduled Meds:  heparin  5,000 Units Subcutaneous Q8H   insulin aspart  0-6 Units Subcutaneous Q4H   metoprolol tartrate  10 mg Intravenous Q8H   mometasone-formoterol  2 puff Inhalation BID   pantoprazole (PROTONIX) IV  40  mg Intravenous Q24H   QUEtiapine  12.5 mg Oral QHS   sodium chloride flush  3 mL Intravenous Q12H   Continuous Infusions:  dextrose 5 % and 0.45 % NaCl with KCl 20 mEq/L 75 mL/hr at 02/12/21 0540   methocarbamol (ROBAXIN) IV 500 mg (02/12/21 0550)     LOS: 5 days   Lonia Blood, MD [facility] Office  [phone] Pager - Text Page  per Amion  If 7PM-7AM, please contact night-coverage per Amion 02/12/2021, 9:14 AM

## 2021-02-12 NOTE — Progress Notes (Signed)
   02/12/21 1100  Mobility  Activity Ambulated in hall  Level of Assistance Contact guard assist, steadying assist  Assistive Device Front wheel walker  Distance Ambulated (ft) 150 ft  Mobility Ambulated with assistance in hallway  Mobility Response Tolerated well  Mobility performed by Mobility specialist  $Mobility charge 1 Mobility   Pt agreeable to mobilize this afternoon. Ambulated about 114ft in hall with RW, tolerated well. She did state that she was experiencing 8-9/10 pain in her abdomen, but was determined to walk as far as she could. Ensured to the pt that she need not push herself too hard, but she was insistent. Otherwise no complaints. Left pt in bed with family present, and call bell at side. RN notified of session.   Timoteo Expose Mobility Specialist Acute Rehab Services Office: [phone]

## 2021-02-12 NOTE — Progress Notes (Signed)
Pharmacy Antibiotic Note  Laurie [name] is a 65 y.o. female admitted on 02/06/2021 .  Pharmacy has been consulted for Zosyn dosing for IA abscess.  10/31 - small bowel resection with anastomosis, lysis of adhesions, incisional hernia repair 11/5 drain placed Plan: Zosyn 3.375g IV q8h (4 hour infusion).  Height: 5\' 4"  (162.6 cm) Weight: 90.7 kg (200 lb) IBW/kg (Calculated) : 54.7  Temp (24hrs), Avg:98.3 F (36.8 C), Min:97.8 F (36.6 C), Max:98.5 F (36.9 C)  Recent Labs  Lab 02/08/21 0541 02/09/21 0443 02/10/21 0444 02/11/21 0513 02/12/21 0532  WBC 21.9* 24.3* 27.3* 21.3* 18.0*  CREATININE 1.76* 1.04* 1.15* 0.99 0.70    Estimated Creatinine Clearance: 75.5 mL/min (by C-G formula based on SCr of 0.7 mg/dL).    Allergies  Allergen Reactions   Buchu-Cornsilk-Ch Grass-Hydran Other (See Comments)    Dehydration. Elevated creatini Other reaction(s): Other (See Comments) Dehydration, increase in creatinine   Celebrex [Celecoxib] Itching   Ciprofloxacin Other (See Comments)    No history of rash or SOB. Had kidney problems when she took cipro but was not told she had interstitial nephritis. Other reaction(s): Other (See Comments), Unknown Kidney problems   Cymbalta [Duloxetine Hcl]     GI upset / constipation.    Dilaudid [Hydromorphone Hcl] Itching   Fentanyl And Related Itching   Glucophage [Metformin Hcl] Other (See Comments)    Stopped due to increase creat   Oxycodone Itching   Pregabalin Other (See Comments)    CNS disorder. Headache Other reaction(s): Other (See Comments) Headache, CNS disorder   Hydrocodone-Acetaminophen Rash   Morphine And Related Rash and Itching   Sulfa Antibiotics Rash   Vicodin [Hydrocodone-Acetaminophen] Rash   Antimicrobials this admission:  10/31 Ancef x 1 11/6 Zosyn>> Dose adjustments this admission:   Microbiology results:  11/5 abscess: mod GNR, mod GPC 11/3 MRSA neg  Thank you for allowing pharmacy to be a part of  this patient's care.  13/3, Pharm.D 02/12/2021 10:42 AM

## 2021-02-13 DIAGNOSIS — K436 Other and unspecified ventral hernia with obstruction, without gangrene: Secondary | ICD-10-CM | POA: Diagnosis not present

## 2021-02-13 DIAGNOSIS — R188 Other ascites: Secondary | ICD-10-CM | POA: Diagnosis not present

## 2021-02-13 DIAGNOSIS — N179 Acute kidney failure, unspecified: Secondary | ICD-10-CM | POA: Diagnosis not present

## 2021-02-13 LAB — CBC
HCT: 32 % — ABNORMAL LOW (ref 36.0–46.0)
Hemoglobin: 10.5 g/dL — ABNORMAL LOW (ref 12.0–15.0)
MCH: 32.1 pg (ref 26.0–34.0)
MCHC: 32.8 g/dL (ref 30.0–36.0)
MCV: 97.9 fL (ref 80.0–100.0)
Platelets: 344 10*3/uL (ref 150–400)
RBC: 3.27 MIL/uL — ABNORMAL LOW (ref 3.87–5.11)
RDW: 14 % (ref 11.5–15.5)
WBC: 14 10*3/uL — ABNORMAL HIGH (ref 4.0–10.5)
nRBC: 0.1 % (ref 0.0–0.2)

## 2021-02-13 LAB — BASIC METABOLIC PANEL
Anion gap: 10 (ref 5–15)
BUN: 10 mg/dL (ref 8–23)
CO2: 20 mmol/L — ABNORMAL LOW (ref 22–32)
Calcium: 8.9 mg/dL (ref 8.9–10.3)
Chloride: 111 mmol/L (ref 98–111)
Creatinine, Ser: 0.85 mg/dL (ref 0.44–1.00)
GFR, Estimated: >60 mL/min (ref >60)
Glucose, Bld: 156 mg/dL — ABNORMAL HIGH (ref 70–99)
Potassium: 3.4 mmol/L — ABNORMAL LOW (ref 3.5–5.1)
Sodium: 141 mmol/L (ref 135–145)

## 2021-02-13 LAB — GLUCOSE, CAPILLARY
Glucose-Capillary: 148 mg/dL — ABNORMAL HIGH (ref 70–99)
Glucose-Capillary: 162 mg/dL — ABNORMAL HIGH (ref 70–99)
Glucose-Capillary: 188 mg/dL — ABNORMAL HIGH (ref 70–99)
Glucose-Capillary: 225 mg/dL — ABNORMAL HIGH (ref 70–99)
Glucose-Capillary: 169 mg/dL — ABNORMAL HIGH (ref 70–99)
Glucose-Capillary: 170 mg/dL — ABNORMAL HIGH (ref 70–99)

## 2021-02-13 LAB — MAGNESIUM: Magnesium: 1.8 mg/dL (ref 1.7–2.4)

## 2021-02-13 MED ORDER — HYDROMORPHONE HCL 1 MG/ML IJ SOLN
0.5000 mg | Freq: Once | INTRAMUSCULAR | Status: AC
  Administered 2021-02-13: 0.5 mg via INTRAVENOUS
  Filled 2021-02-13: qty 0.5

## 2021-02-13 MED ORDER — POLYETHYLENE GLYCOL 3350 17 G PO PACK
17.0000 g | PACK | Freq: Every day | ORAL | Status: DC
  Administered 2021-02-13 – 2021-02-26 (×9): 17 g via ORAL
  Filled 2021-02-13 (×11): qty 1

## 2021-02-13 MED ORDER — CALCIUM POLYCARBOPHIL 625 MG PO TABS
625.0000 mg | ORAL_TABLET | Freq: Two times a day (BID) | ORAL | Status: DC
  Administered 2021-02-13 – 2021-02-27 (×23): 625 mg via ORAL
  Filled 2021-02-13 (×31): qty 1

## 2021-02-13 MED ORDER — BISACODYL 10 MG RE SUPP: 10.0000 mg | Freq: Every day | RECTAL | Status: DC | PRN

## 2021-02-13 MED ORDER — POTASSIUM CHLORIDE CRYS ER 20 MEQ PO TBCR
40.0000 meq | EXTENDED_RELEASE_TABLET | Freq: Once | ORAL | Status: AC
  Administered 2021-02-13: 40 meq via ORAL
  Filled 2021-02-13: qty 2

## 2021-02-13 MED ORDER — INSULIN ASPART 100 UNIT/ML IJ SOLN
0.0000 [IU] | Freq: Every day | INTRAMUSCULAR | Status: DC
  Administered 2021-02-14: 22:00:00 2 [IU] via SUBCUTANEOUS

## 2021-02-13 MED ORDER — INSULIN ASPART 100 UNIT/ML IJ SOLN
0.0000 [IU] | Freq: Three times a day (TID) | INTRAMUSCULAR | Status: DC
  Administered 2021-02-13: 2 [IU] via SUBCUTANEOUS
  Administered 2021-02-13: 17:00:00 3 [IU] via SUBCUTANEOUS
  Administered 2021-02-14 – 2021-02-15 (×5): 2 [IU] via SUBCUTANEOUS
  Administered 2021-02-15: 1 [IU] via SUBCUTANEOUS
  Administered 2021-02-16: 2 [IU] via SUBCUTANEOUS
  Administered 2021-02-16 – 2021-02-17 (×3): 1 [IU] via SUBCUTANEOUS
  Administered 2021-02-17 – 2021-02-18 (×4): 2 [IU] via SUBCUTANEOUS
  Administered 2021-02-18 – 2021-02-20 (×5): 1 [IU] via SUBCUTANEOUS
  Administered 2021-02-21: 18:00:00 2 [IU] via SUBCUTANEOUS
  Administered 2021-02-21 (×2): 1 [IU] via SUBCUTANEOUS
  Administered 2021-02-22: 2 [IU] via SUBCUTANEOUS
  Administered 2021-02-22: 3 [IU] via SUBCUTANEOUS
  Administered 2021-02-22: 14:00:00 2 [IU] via SUBCUTANEOUS
  Administered 2021-02-23: 14:00:00 3 [IU] via SUBCUTANEOUS
  Administered 2021-02-23 – 2021-02-24 (×2): 1 [IU] via SUBCUTANEOUS
  Administered 2021-02-25: 2 [IU] via SUBCUTANEOUS
  Administered 2021-02-25 (×2): 1 [IU] via SUBCUTANEOUS
  Administered 2021-02-26 (×2): 2 [IU] via SUBCUTANEOUS
  Administered 2021-02-26: 1 [IU] via SUBCUTANEOUS
  Administered 2021-02-27: 2 [IU] via SUBCUTANEOUS
  Administered 2021-02-27: 1 [IU] via SUBCUTANEOUS

## 2021-02-13 MED ORDER — PANTOPRAZOLE SODIUM 40 MG PO TBEC
40.0000 mg | DELAYED_RELEASE_TABLET | Freq: Every day | ORAL | Status: DC
  Administered 2021-02-13 – 2021-02-27 (×13): 40 mg via ORAL
  Filled 2021-02-13 (×14): qty 1

## 2021-02-13 MED ORDER — SACCHAROMYCES BOULARDII 250 MG PO CAPS
250.0000 mg | ORAL_CAPSULE | Freq: Two times a day (BID) | ORAL | Status: DC
  Administered 2021-02-13 – 2021-02-27 (×26): 250 mg via ORAL
  Filled 2021-02-13 (×27): qty 1

## 2021-02-13 MED ORDER — DOCUSATE SODIUM 100 MG PO CAPS
100.0000 mg | ORAL_CAPSULE | Freq: Two times a day (BID) | ORAL | Status: DC
  Administered 2021-02-13: 100 mg via ORAL
  Filled 2021-02-13: qty 1

## 2021-02-13 MED ORDER — ENOXAPARIN SODIUM 40 MG/0.4ML IJ SOSY
40.0000 mg | PREFILLED_SYRINGE | INTRAMUSCULAR | Status: DC
  Administered 2021-02-13 – 2021-02-27 (×14): 40 mg via SUBCUTANEOUS
  Filled 2021-02-13 (×14): qty 0.4

## 2021-02-13 MED ORDER — METOPROLOL SUCCINATE ER 50 MG PO TB24
50.0000 mg | ORAL_TABLET | Freq: Every day | ORAL | Status: DC
  Administered 2021-02-13 – 2021-02-27 (×13): 50 mg via ORAL
  Filled 2021-02-13 (×14): qty 1

## 2021-02-13 NOTE — Progress Notes (Signed)
Occupational Therapy Treatment Patient Details Name: Laurie [name] MRN: [id number] DOB: [birth year] Today's Date: 02/13/2021   History of present illness Laurie [name] is a 65 y.o. female with a history of T2DM, HTN, HLD, anxiety/depression, GERD, obesity and multiple abdominopelvic surgeries who presented to the ED 02/06/21 with abdominal pain , CT revealed an incarcerated ventral hernia. S/P small bowell resection, hernia repair and lysis of adhesions 02/06/21. patient underwent drain placement for anterior peirtoneal abcess on 11/5.   OT comments  Patient was educated on LB dressing with AE. Patient verbalized and demonstrated understanding with simulated donning pants and donning/doffing socks sitting on edge of bed. Patient tolerated session well. Patient's discharge plan remains appropriate at this time. OT will continue to follow acutely.     Recommendations for follow up therapy are one component of a multi-disciplinary discharge planning process, led by the attending physician.  Recommendations may be updated based on patient status, additional functional criteria and insurance authorization.    Follow Up Recommendations  Home health OT    Assistance Recommended at Discharge Frequent or constant Supervision/Assistance  Equipment Recommendations  Other (comment) (total hip kit)    Recommendations for Other Services      Precautions / Restrictions Precautions Precaution Comments: abdomen, wound vac Restrictions Weight Bearing Restrictions: No       Mobility Bed Mobility Overal bed mobility: Needs Assistance Bed Mobility: Sit to Supine       Sit to supine: Min assist        Transfers Overall transfer level: Needs assistance Equipment used: Rolling walker (2 wheels) Transfers: Sit to/from Stand Sit to Stand: Min guard;From elevated surface Stand pivot transfers: Min assist         General transfer comment: VCs hand placement     Balance                                            ADL either performed or assessed with clinical judgement   ADL Overall ADL's : Needs assistance/impaired   Eating/Feeding Details (indicate cue type and reason): liquid diet         Lower Body Bathing: Min guard;Minimal assistance;Sitting/lateral leans;Sit to/from stand       Lower Body Dressing: Minimal assistance;Min guard;Sit to/from stand;Sitting/lateral leans Lower Body Dressing Details (indicate cue type and reason): patient was educated on using AE for LB dressing tasks. Toilet Transfer: Agricultural engineer (2 wheels) Toilet Transfer Details (indicate cue type and reason): education on proper hand placement Toileting- Clothing Manipulation and Hygiene: Moderate assistance;Sit to/from stand Toileting - Clothing Manipulation Details (indicate cue type and reason): education on not wiping fowards            Extremity/Trunk Assessment Upper Extremity Assessment Upper Extremity Assessment: Overall WFL for tasks assessed   Lower Extremity Assessment Lower Extremity Assessment: Defer to PT evaluation   Cervical / Trunk Assessment Cervical / Trunk Assessment: Normal;Other exceptions Cervical / Trunk Exceptions: guarding abdomen when stands    Vision Patient Visual Report: No change from baseline     Perception     Praxis      Cognition Arousal/Alertness: Awake/alert Behavior During Therapy: WFL for tasks assessed/performed Overall Cognitive Status: Within Functional Limits for tasks assessed  Exercises     Shoulder Instructions       General Comments      Pertinent Vitals/ Pain       Pain Assessment: Faces Faces Pain Scale: Hurts little more Pain Location: abdomen Pain Descriptors / Indicators: Grimacing;Guarding Pain Intervention(s): Monitored during session;Repositioned  Home Living                                           Prior Functioning/Environment              Frequency  Min 2X/week        Progress Toward Goals  OT Goals(current goals can now be found in the care plan section)  Progress towards OT goals: Progressing toward goals     Plan Discharge plan remains appropriate    Co-evaluation                 AM-PAC OT "6 Clicks" Daily Activity     Outcome Measure   Help from another person eating meals?: Total (NPO) Help from another person taking care of personal grooming?: A Little Help from another person toileting, which includes using toliet, bedpan, or urinal?: A Little Help from another person bathing (including washing, rinsing, drying)?: A Lot Help from another person to put on and taking off regular upper body clothing?: A Little Help from another person to put on and taking off regular lower body clothing?: A Little (with AE) 6 Click Score: 15    End of Session Equipment Utilized During Treatment: Rolling walker (2 wheels)  OT Visit Diagnosis: Unsteadiness on feet (R26.81);Other abnormalities of gait and mobility (R26.89)   Activity Tolerance Patient limited by pain   Patient Left in bed;with call bell/phone within reach;with bed alarm set   Nurse Communication Mobility status        Time: 1610-9604 OT Time Calculation (min): 27 min  Charges: OT General Charges $OT Visit: 1 Visit OT Treatments $Self Care/Home Management : 23-37 mins  Laurie [name] OTR/L, [place] Acute Rehabilitation Department Office# [phone] Pager# [phone]   [place] 02/13/2021, 12:24 PM

## 2021-02-13 NOTE — Progress Notes (Signed)
   02/13/21 1300  Mobility  Activity Ambulated in room;Transferred to/from BSC  Level of Assistance Contact guard assist, steadying assist  Assistive Device Front wheel walker  Distance Ambulated (ft) 50 ft  Mobility Ambulated with assistance in room;Out of bed for toileting  Mobility Response Tolerated well  Mobility performed by Mobility specialist  $Mobility charge 1 Mobility   Pt reluctant to mobilize in hall secondary to some abdominal pain. Agreeable to mobilizing in her room. Ambulated around the bed with RW about 71ft, tolerated well. Pt requested to use BSC. Assisisted pt with this. Left pt in bed with call bell at side and RN present.    Timoteo Expose Mobility Specialist Acute Rehab Services Office: [phone]

## 2021-02-13 NOTE — Progress Notes (Signed)
Laurie [name]  JJK:[id number] DOB: 04/06/1956 DOA: 02/06/2021 PCP: Margit Hanks, MD    Brief Narrative:  36JI w/ a hx of DM2, HTN, HLD, anxiety/depression, GERD, obesity, and multiple abdomino-pelvic surgeries who presented to the ED with 5 days of worsening lower abdominal pain associated with loose stools and vomiting. In the ED CT revealed an incarcerated ventral hernia. EDP attempted reduction though symptoms did not improve. Labs notable for acute renal failure in the setting of dehydration. She was transferred to [facility] ED for surgical consult, and repeat CT abdomen showed persistent hernia with bowel obstruction for which she was taken to the OR.  Significant events: 10/31 ventral hernia repair with small bowel resection and LOA 11/5 CT-guided drainage of anterior peritoneal abscess in IR -10 French drain left in place none  Consultants:  General Surgery IR  Code Status: FULL CODE  Antimicrobials:  Zosyn 11/6 >  DVT prophylaxis: Heparin  Interval history: Afebrile.  Vital signs stable.  Saturation 96% room air.  CBG reasonably controlled.  Renal function stable.  WBC continues to fall.  Hemoglobin slightly lower today.  Complains of ongoing modest abdominal cramping but no new complaints today.  Assessment & Plan:  Incarcerated ventral hernia with SBO Status post repair with small bowel resection and LOA 10/31 - ongoing care per General Surgery - NG tube remains in place - persisting abdominal discomfort and climbing WBC raised concern - CT abdomen pelvis 11/4 noted questionable fluid collection within the abdomen -appears to be improving  Anterior peirtoneal abscess Drained in IR 11/6 w/ drain left in place - follow cultures -WBC falling and patient not febrile -continue broad-spectrum empiric antibiotic for now  Mild confusion Likely a component of sundowning/acute delirium with possible component of baseline cognitive deficit - continue low-dose nightly  Seroquel - delirium precautions -mental status appears to be slowly improving  Mild hypernatremia Corrected with increased free water in IV -encouraged oral hydration  Hypomagnesemia Due to lack of intake -corrected with supplementation  Acute renal failure Worsened initially postop -baseline creatinine approximately 1.7 -creatinine dramatically improved 11/2 and remains stable within normal range  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   Family Communication: No family present at time of exam today Status is: Inpatient   Objective: Blood pressure 137/72, pulse 75, temperature 98 F (36.7 C), temperature source Oral, resp. rate 20, height 5\' 4"  (1.626 m), weight 90.7 kg, SpO2 96 %.  Intake/Output Summary (Last 24 hours) at 02/13/2021 0903 Last data filed at 02/13/2021 0600 Gross per 24 hour  Intake 110 ml  Output 1390 ml  Net -1280 ml    Filed Weights   02/06/21 1053  Weight: 90.7 kg    Examination: General: No acute respiratory distress Lungs: Clear to auscultation bilaterally without wheezing Cardiovascular: RRR without rub Abdomen: Incision clean and dry, no rebound, tender to palpation diffusely, no mass Extremities: No edema B LE  CBC: Recent Labs  Lab 02/06/21 1155 02/07/21 0546 02/11/21 0513 02/12/21 0532 02/13/21 0442  WBC  11.1*   < > 21.3* 18.0* 14.0*  NEUTROABS 7.9*  --   --   --   --   HGB 13.8   < > 10.9* 11.2* 10.5*  HCT 40.9   < > 34.7* 37.1  32.0*  MCV 94.5   < > 101.2* 106.0* 97.9  PLT 507*   < > 389 343 344   < > = values in this interval not displayed.    Basic Metabolic Panel: Recent Labs  Lab 02/11/21 0513 02/12/21 0532 02/13/21 0442  NA 145 145 141  K 4.2 4.0 3.4*  CL 116* 114* 111  CO2 21* 21* 20*  GLUCOSE 167* 153* 156*  BUN 17 10 10   CREATININE 0.99 0.70 0.85  CALCIUM 9.0 9.2 8.9  MG 1.9 1.6* 1.8  PHOS 3.0 3.4  --     GFR: Estimated Creatinine Clearance: 71 mL/min (by C-G formula based on SCr of 0.85 mg/dL).  Liver Function Tests: Recent Labs  Lab 02/06/21 1315 02/07/21 0546 02/11/21 0513 02/12/21 0532  AST 22 25 24 26   ALT 18 18 18 23   ALKPHOS 58 55 85 90  BILITOT 0.4 0.7 0.6 0.7  PROT 8.0 6.7 7.1 6.9  ALBUMIN 4.1 3.3* 2.7* 2.7*    Recent Labs  Lab 02/06/21 1315  LIPASE 34     HbA1C: Hgb A1c MFr Bld  Date/Time Value Ref Range Status  02/07/2021 05:46 AM 6.2 (H) 4.8 - 5.6 % Final    Comment:    (NOTE) Pre diabetes:          5.7%-6.4%  Diabetes:              >6.4%  Glycemic control for   <7.0% adults with diabetes   06/25/2015 05:06 AM 6.2 (H) 4.8 - 5.6 % Final    Comment:    (NOTE)         Pre-diabetes: 5.7 - 6.4         Diabetes: >6.4         Glycemic control for adults with diabetes: <7.0     CBG: Recent Labs  Lab 02/12/21 1558 02/12/21 2036 02/12/21 2309 02/13/21 0322 02/13/21 0723  GLUCAP 225* 141* 194* 148* 162*     Recent Results (from the past 240 hour(s))  Resp Panel by RT-PCR (Flu A&B, Covid) Nasopharyngeal Swab     Status: None   Collection Time: 02/06/21 11:55 AM   Specimen: Nasopharyngeal Swab; Nasopharyngeal(NP) swabs in vial transport medium  Result Value Ref Range Status   SARS Coronavirus 2 by RT PCR NEGATIVE NEGATIVE Final    Comment: (NOTE) SARS-CoV-2 target nucleic acids are NOT DETECTED.  The SARS-CoV-2 RNA is generally detectable in upper respiratory specimens during the acute phase of infection. The lowest concentration of  SARS-CoV-2 viral copies this assay can detect is 138 copies/mL. A negative result does not preclude SARS-Cov-2 infection and should not be used as the sole basis for treatment or other patient management decisions. A negative result may occur with  improper specimen collection/handling, submission of specimen other than nasopharyngeal swab, presence of viral mutation(s) within the areas targeted by this assay, and inadequate number of viral copies(<138 copies/mL). A negative result must be combined with clinical observations, patient history, and epidemiological information. The expected result is Negative.  Fact Sheet for Patients:  13/07/22  Fact Sheet for Healthcare Providers:  13/07/22  This test is no t yet approved or cleared by the 02/08/21 and  has been authorized for  detection and/or diagnosis of SARS-CoV-2 by FDA under an Emergency Use Authorization (EUA). This EUA will remain  in effect (meaning this test can be used) for the duration of the COVID-19 declaration under Section 564(b)(1) of the Act, 21 U.S.C.section 360bbb-3(b)(1), unless the authorization is terminated  or revoked sooner.       Influenza A by PCR NEGATIVE NEGATIVE Final   Influenza B by PCR NEGATIVE NEGATIVE Final    Comment: (NOTE) The Xpert Xpress SARS-CoV-2/FLU/RSV plus assay is intended as an aid in the diagnosis of influenza from Nasopharyngeal swab specimens and should not be used as a sole basis for treatment. Nasal washings and aspirates are unacceptable for Xpert Xpress SARS-CoV-2/FLU/RSV testing.  Fact Sheet for Patients: BloggerCourse.com  Fact Sheet for Healthcare Providers: SeriousBroker.it  This test is not yet approved or cleared by the [place] FDA and has been authorized for detection and/or diagnosis of SARS-CoV-2 by FDA under an Emergency Use  Authorization (EUA). This EUA will remain in effect (meaning this test can be used) for the duration of the COVID-19 declaration under Section 564(b)(1) of the Act, 21 U.S.C. section 360bbb-3(b)(1), unless the authorization is terminated or revoked.  Performed at [facility], [address] [place], [place] [address]   MRSA Next Gen by PCR, Nasal     Status: None   Collection Time: 02/09/21  8:29 AM   Specimen: Nasal Mucosa; Nasal Swab  Result Value Ref Range Status   MRSA by PCR Next Gen NOT DETECTED NOT DETECTED Final    Comment: (NOTE) The GeneXpert MRSA Assay (FDA approved for NASAL specimens only), is one component of a comprehensive MRSA colonization surveillance program. It is not intended to diagnose MRSA infection nor to guide or monitor treatment for MRSA infections. Test performance is not FDA approved in patients less than 67 years old. Performed at [facility], [address], [place], [place] [address]   Aerobic/Anaerobic Culture w Gram Stain (surgical/deep wound)     Status: None (Preliminary result)   Collection Time: 02/11/21  1:56 PM   Specimen: Abscess  Result Value Ref Range Status   Specimen Description   Final    ABSCESS Performed at [facility], [address], [place], [place] [address]    Special Requests   Final    Normal Performed at [facility], [address], [place], [place] [address]    Gram Stain   Final    NO SQUAMOUS EPITHELIAL CELLS SEEN ABUNDANT WBC SEEN MODERATE GRAM NEGATIVE RODS MODERATE GRAM POSITIVE COCCI    Culture   Final    ABUNDANT GRAM NEGATIVE RODS CULTURE REINCUBATED FOR BETTER GROWTH Performed at [facility], [address], [place], [place] [address]    Report Status PENDING  Incomplete      Scheduled Meds:  heparin  5,000 Units Subcutaneous Q8H   insulin aspart  0-6 Units Subcutaneous Q4H   metoprolol tartrate  10 mg  Intravenous Q8H   mometasone-formoterol  2 puff Inhalation BID   pantoprazole (PROTONIX) IV  40 mg Intravenous Q24H   QUEtiapine  12.5 mg Oral QHS   sodium chloride flush  3 mL Intravenous Q12H   sodium chloride flush  5 mL Intracatheter Q8H   Continuous Infusions:  dextrose 5 % and 0.45 % NaCl with KCl 20 mEq/L 50 mL/hr at 02/12/21 2159   methocarbamol (ROBAXIN) IV 500 mg (02/13/21 0555)   piperacillin-tazobactam (ZOSYN)  IV 3.375 g (02/13/21 0408)  LOS: 6 days   Lonia Blood, MD [facility] Office  [phone] Pager - Text Page per Amion  If 7PM-7AM, please contact night-coverage per Amion 02/13/2021, 9:03 AM

## 2021-02-13 NOTE — Care Management Important Message (Signed)
Important Message  Patient Details IM Letter given to the Patient. Name: Laurie [name] MRN: [id number] Date of Birth: Jul 31, 1955   Medicare Important Message Given:  Yes     [name] 02/13/2021, 12:10 PM

## 2021-02-13 NOTE — TOC Initial Note (Signed)
Transition of Care [facility]) - Initial/Assessment Note    Patient Details  Name: Laurie [name] MRN: [id number] Date of Birth: [birth year]  Transition of Care [facility]) CM/SW Contact:    Trish Mage, LCSW Phone Number: 02/13/2021, 3:05 PM  Clinical Narrative:   Patient seen in follow up to recommendation of [facility] services.  Ms Livshits was being visited by her daughter and sister, and she named at least 40 other supports who will all be involved in her care at d/c, including significant other with whom she lives.  Sister Curly Shores informs me that this is not her sister's first rodeo, and they have been involved in helping with dressing changes and drains in the past.  She has a walker and a bedside commode-is in need of a tub bench.  She is also requesting all available [facility] services.  Spoke with sister Daneen Schick [facility], per patient request, who reiterates that care is in place at home.  She requests that we deliver all above DME as walker and BSC were another family member's, and she is not sure where they are located.  She is also in favor of the full array of [place] services.  Spoke with Cindie with [facility] who agrees to service patient for [facility] needs.  She requested drain supplies to last for 3 days at d/c.  Will need orders for the services and DME RW, 3 in 1 and tub bench. TOC will continue to follow during the course of hospitalization.                Expected Discharge Plan: [place] Barriers to Discharge: No Barriers Identified   Patient Goals and CMS Choice     Choice offered to / list presented to : Patient, Adult Children, Sibling  Expected Discharge Plan and Services Expected Discharge Plan: [place]   Discharge Planning Services: CM Consult Post Acute Care Choice: Lamyra Malcolm [place] arrangements for the past 2 months: Single Family Home                                      Prior Living Arrangements/Services Living arrangements for the past 2  months: Single Family Home Lives with:: Significant Other Patient language and need for interpreter reviewed:: Yes        Need for Family Participation in Patient Care: Yes (Comment) Care giver support system in place?: Yes (comment) Current home services: DME Criminal Activity/Legal Involvement Pertinent to Current Situation/Hospitalization: No - Comment as needed  Activities of Daily Living Home Assistive Devices/Equipment: Eyeglasses ADL Screening (condition at time of admission) Patient's cognitive ability adequate to safely complete daily activities?: Yes Is the patient deaf or have difficulty hearing?: No Does the patient have difficulty seeing, even when wearing glasses/contacts?: No Does the patient have difficulty concentrating, remembering, or making decisions?: No Patient able to express need for assistance with ADLs?: Yes Does the patient have difficulty dressing or bathing?: No Independently performs ADLs?: Yes (appropriate for developmental age) Does the patient have difficulty walking or climbing stairs?: No Weakness of Legs: Both Weakness of Arms/Hands: Both  Permission Sought/Granted Permission sought to share information with : Family Supports Permission granted to share information with : Yes, Verbal Permission Granted  Share Information with NAME: Hall,Jolana "Josie" (Sister)   [phone]           Emotional Assessment Appearance:: Appears stated age Attitude/Demeanor/Rapport:  Engaged Affect (typically observed): Appropriate Orientation: : Oriented to Self, Oriented to Place, Oriented to  Time, Oriented to Situation Alcohol / Substance Use: Not Applicable Psych Involvement: No (comment)  Admission diagnosis:  Incarcerated ventral hernia [K43.6] Acute renal failure (ARF) (HCC) [N17.9] Acute kidney injury (HCC) [N17.9] Patient Active Problem List   Diagnosis Date Noted   Acute renal failure (ARF) (HCC) 02/06/2021   Diabetes mellitus without  complication (HCC)    Incarcerated ventral hernia    SBO (small bowel obstruction) (HCC)    Obesity    Esophageal obstruction due to food impaction    Esophageal foreign body, initial encounter    Food impaction of esophagus    AKI (acute kidney injury) (HCC) 07/01/2015   Cellulitis of buttock 06/24/2015   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

## 2021-02-13 NOTE — Progress Notes (Signed)
Patient ID: Laurie [name], female   DOB: 1956-02-22, 65 y.o.   MRN: [id number] [facility] Surgery Progress Note  7 Days Post-Op  Subjective: CC-  Feeling a little lousy today. States that her abdomen is bubbling. Feels a little nauseated but no emesis. Tolerating diet but not eating a lot. Passing small amount of flatus, has not had a BM in several days. WBC down to 14, afebrile  Objective: Vital signs in last 24 hours: Temp:  [98 F (36.7 C)-98.6 F (37 C)] 98 F (36.7 C) (11/07 0320) Pulse Rate:  [62-75] 75 (11/07 0320) Resp:  [18-20] 20 (11/07 0320) BP: (137-146)/(57-72) 137/72 (11/07 0320) SpO2:  [94 %-100 %] 96 % (11/07 0843) Last BM Date: 02/05/21  Intake/Output from previous day: 11/06 0701 - 11/07 0700 In: 110 [IV Piggyback:100] Out: 2990 [Urine:2700; Drains:290] Intake/Output this shift: No intake/output data recorded.  PE: Gen:  Alert, NAD, pleasant Pulm: rate and effort normal Abd: vac to midline with good seal, soft, nondistended, mild TTP around drain, drain purulent   Lab Results:  Recent Labs    02/12/21 0532 02/13/21 0442  WBC 18.0* 14.0*  HGB 11.2* 10.5*  HCT 37.1 32.0*  PLT 343 344   BMET Recent Labs    02/12/21 0532 02/13/21 0442  NA 145 141  K 4.0 3.4*  CL 114* 111  CO2 21* 20*  GLUCOSE 153* 156*  BUN 10 10  CREATININE 0.70 0.85  CALCIUM 9.2 8.9   PT/INR No results for input(s): LABPROT, INR in the last 72 hours. CMP     Component Value Date/Time   NA 141 02/13/2021 0442   K 3.4 (L) 02/13/2021 0442   CL 111 02/13/2021 0442   CO2 20 (L) 02/13/2021 0442   GLUCOSE 156 (H) 02/13/2021 0442   BUN 10 02/13/2021 0442   CREATININE 0.85 02/13/2021 0442   CALCIUM 8.9 02/13/2021 0442   PROT 6.9 02/12/2021 0532   ALBUMIN 2.7 (L) 02/12/2021 0532   AST 26 02/12/2021 0532   ALT 23 02/12/2021 0532   ALKPHOS 90 02/12/2021 0532   BILITOT 0.7 02/12/2021 0532   GFRNONAA >60 02/13/2021 0442   GFRAA 33 (L) 07/01/2015 0352   Lipase      Component Value Date/Time   LIPASE 34 02/06/2021 1315       Studies/Results: CT IMAGE GUIDED DRAINAGE BY PERCUTANEOUS CATHETER  Result Date: 02/11/2021 CLINICAL DATA:  Status post repair of incarcerated ventral hernia with development of postoperative fluid collection of the anterior peritoneal cavity immediately deep to the abdominal wall. EXAM: CT GUIDED CATHETER DRAINAGE OF PERITONEAL ABSCESS ANESTHESIA/SEDATION: Moderate (conscious) sedation was employed during this procedure. A total of Versed 1.5 mg and Fentanyl 75 mcg was administered intravenously by radiology nursing under my supervision. Moderate Sedation Time: 42 minutes. The patient's level of consciousness and vital signs were monitored continuously by radiology nursing throughout the procedure under my direct supervision. PROCEDURE: The procedure, risks, benefits, and alternatives were explained to the patient. Questions regarding the procedure were encouraged and answered. The patient understands and consents to the procedure. A time out was performed prior to initiating the procedure. The abdominal wall was prepped with chlorhexidine in a sterile fashion, and a sterile drape was applied covering the operative field. A sterile gown and sterile gloves were used for the procedure. Local anesthesia was provided with 1% Lidocaine. Under CT guidance, an 18 gauge trocar needle was advanced into an anterior peritoneal fluid collection from a right-sided anterior approach. After confirming needle  tip position and aspiration of fluid via the needle, a guidewire was advanced and the needle removed. The tract was dilated and a 10 French percutaneous drainage catheter placed. The catheter was formed and attached to a suction bulb. Additional CT was performed. A fluid sample was sent for culture analysis. The catheter was secured at the skin with a Prolene retention suture and adhesive StatLock device. COMPLICATIONS: None FINDINGS: Aspiration at  the level of the anterior peritoneal fluid collection just to the right of midline yielded purulent fluid. There was good return of fluid after drain placement. IMPRESSION: CT-guided percutaneous catheter drainage of anterior peritoneal abscess yielding purulent fluid. A fluid sample was sent for culture analysis. A 10 French drain was placed and attached to suction bulb drainage. Electronically Signed   By: Irish Lack M.D.   On: 02/11/2021 14:18    Anti-infectives: Anti-infectives (From admission, onward)    Start     Dose/Rate Route Frequency Ordered Stop   02/12/21 1200  piperacillin-tazobactam (ZOSYN) IVPB 3.375 g        3.375 g 12.5 mL/hr over 240 Minutes Intravenous Every 8 hours 02/12/21 0950     02/06/21 2003  ceFAZolin (ANCEF) 2-4 GM/100ML-% IVPB       Note to Pharmacy: Ponciano Ort   : cabinet override      02/06/21 2003 02/06/21 2046   02/06/21 1945  ceFAZolin (ANCEF) IVPB 2g/100 mL premix        2 g 200 mL/hr over 30 Minutes Intravenous On call to O.R. 02/06/21 1914 02/06/21 2028        Assessment/Plan Ventral hernia with SBO POD#7 S/P ventral hernia repair with small bowel resection, LOA 10/31 Dr. 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

## 2021-02-14 LAB — BASIC METABOLIC PANEL
Anion gap: 8 (ref 5–15)
BUN: 11 mg/dL (ref 8–23)
CO2: 23 mmol/L (ref 22–32)
Calcium: 8.9 mg/dL (ref 8.9–10.3)
Chloride: 110 mmol/L (ref 98–111)
Creatinine, Ser: 0.98 mg/dL (ref 0.44–1.00)
GFR, Estimated: >60 mL/min (ref >60)
Glucose, Bld: 161 mg/dL — ABNORMAL HIGH (ref 70–99)
Potassium: 3.3 mmol/L — ABNORMAL LOW (ref 3.5–5.1)
Sodium: 141 mmol/L (ref 135–145)

## 2021-02-14 LAB — GLUCOSE, CAPILLARY
Glucose-Capillary: 146 mg/dL — ABNORMAL HIGH (ref 70–99)
Glucose-Capillary: 172 mg/dL — ABNORMAL HIGH (ref 70–99)
Glucose-Capillary: 199 mg/dL — ABNORMAL HIGH (ref 70–99)
Glucose-Capillary: 190 mg/dL — ABNORMAL HIGH (ref 70–99)
Glucose-Capillary: 203 mg/dL — ABNORMAL HIGH (ref 70–99)

## 2021-02-14 LAB — CBC
HCT: 34.1 % — ABNORMAL LOW (ref 36.0–46.0)
Hemoglobin: 11 g/dL — ABNORMAL LOW (ref 12.0–15.0)
MCH: 31.9 pg (ref 26.0–34.0)
MCHC: 32.3 g/dL (ref 30.0–36.0)
MCV: 98.8 fL (ref 80.0–100.0)
Platelets: 375 10*3/uL (ref 150–400)
RBC: 3.45 MIL/uL — ABNORMAL LOW (ref 3.87–5.11)
RDW: 14 % (ref 11.5–15.5)
WBC: 13 10*3/uL — ABNORMAL HIGH (ref 4.0–10.5)
nRBC: 0.2 % (ref 0.0–0.2)

## 2021-02-14 MED ORDER — OXYCODONE HCL 5 MG PO TABS
5.0000 mg | ORAL_TABLET | ORAL | Status: DC | PRN
  Administered 2021-02-14 – 2021-02-26 (×28): 10 mg via ORAL
  Filled 2021-02-14 (×28): qty 2

## 2021-02-14 MED ORDER — METHOCARBAMOL 500 MG PO TABS
500.0000 mg | ORAL_TABLET | Freq: Four times a day (QID) | ORAL | Status: DC
  Administered 2021-02-14 – 2021-02-27 (×49): 500 mg via ORAL
  Filled 2021-02-14 (×51): qty 1

## 2021-02-14 MED ORDER — POTASSIUM CHLORIDE CRYS ER 20 MEQ PO TBCR
20.0000 meq | EXTENDED_RELEASE_TABLET | Freq: Two times a day (BID) | ORAL | Status: AC
  Administered 2021-02-14 – 2021-02-15 (×4): 20 meq via ORAL
  Filled 2021-02-14 (×4): qty 1

## 2021-02-14 MED ORDER — TRAMADOL HCL 50 MG PO TABS
50.0000 mg | ORAL_TABLET | Freq: Four times a day (QID) | ORAL | Status: DC | PRN
  Administered 2021-02-14 – 2021-02-21 (×6): 50 mg via ORAL
  Filled 2021-02-14 (×7): qty 1

## 2021-02-14 MED ORDER — BOOST / RESOURCE BREEZE PO LIQD CUSTOM
1.0000 | Freq: Three times a day (TID) | ORAL | Status: DC
  Administered 2021-02-14 – 2021-02-20 (×13): 1 via ORAL

## 2021-02-14 NOTE — Progress Notes (Signed)
Laurie [name]  DDU:[id number] DOB: 06-13-1955 DOA: 02/06/2021 PCP: Margit Hanks, MD    Brief Narrative:  22JS w/ a hx of DM2, HTN, HLD, anxiety/depression, GERD, obesity, and multiple abdomino-pelvic surgeries who presented to the ED with 5 days of worsening lower abdominal pain associated with loose stools and vomiting. In the ED CT revealed an incarcerated ventral hernia. EDP attempted reduction though symptoms did not improve. Labs notable for acute renal failure in the setting of dehydration. She was transferred to [facility] ED for surgical consult, and repeat CT abdomen showed persistent hernia with bowel obstruction for which she was taken to the OR.  Significant events: 10/31 ventral hernia repair with small bowel resection and LOA 11/5 CT-guided drainage of anterior peritoneal abscess in IR -10 French drain left in place none  Consultants:  General Surgery IR  Code Status: FULL CODE  Antimicrobials:  Zosyn 11/6 >  DVT prophylaxis: Heparin  Interval history: No acute events reported overnight.  Afebrile.  Vital signs stable.  Mild hypokalemia noted.  WBC slowly trending downward.  Thus far abscess culture revealing abundant gram-negative rods without speciation or sensitivities.  Reports ongoing cramping abdominal pain but denies that it is severe.  No chest pain or shortness of breath.  Assessment & Plan:  Incarcerated ventral hernia with SBO Status post repair with small bowel resection and LOA 10/31 - ongoing care per General Surgery - NG tube has been removed successfully - persisting abdominal discomfort and climbing WBC raised concern leading to CT abdomen pelvis 11/4 which noted questionable fluid collection within the abdomen which was drained by IR - pain much improved overall - diet advanced successfully  Anterior peirtoneal abscess Drained in IR 11/6 - following cultures - WBC falling and patient not febrile -continue broad-spectrum empiric antibiotic for now  -follow-up speciation and sensitivities  Mild confusion Likely a component of sundowning/acute delirium with possible component of baseline cognitive deficit - continue low-dose nightly Seroquel - delirium precautions -mental status slowly improving  Mild hypernatremia Corrected with increased free water in IV - encouraged oral hydration  Hypomagnesemia Due to lack of intake -corrected with supplementation  Hypokalemia Supplement and follow  Acute renal failure Worsened initially postop -baseline creatinine approximately 1.7 -creatinine dramatically improved 11/2 and remains stable within normal range  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 Vonita Moss at [facility] - history of cystectomy and right colon pouch for interstitial cystitis 1990s - pouch stone is found to be nonobstructing  DM2 CBG well controlled  HTN BP reasonably controlled  COPD Continue usual Dulera and as needed albuterol - well compensated   GERD Continue usual home medical therapy  Depression Continue usual home medical therapy  HLD Statin on hold until oral intake more consistent  Obesity - Body mass index is 34.33 kg/m.   Family Communication:  Status is: Inpatient -ultimate disposition will be discharge home -she has many family members and an extensive support network   Objective: Blood pressure 128/74, pulse 84, temperature (!) 97.5 F (36.4 C), temperature source Oral, resp. rate 20, height 5\' 4"  (1.626 m), weight 90.7 kg, SpO2 94 %.  Intake/Output Summary (Last 24 hours) at 02/14/2021 0846 Last data filed at 02/14/2021 0800 Gross per 24 hour  Intake --  Output 830 ml  Net -830 ml    Filed Weights   02/06/21 1053  Weight: 90.7 kg    Examination: General: No acute respiratory distress -alert and oriented Lungs: Clear to auscultation bilaterally -no wheezing  Cardiovascular: RRR without rub  Abdomen: Incision clean and dry, no rebound, tender to  palpation diffusely, no mass Extremities: No edema B lower extremities  CBC: Recent Labs  Lab 02/12/21 0532 02/13/21 0442 02/14/21 0504  WBC 18.0* 14.0* 13.0*  HGB 11.2* 10.5* 11.0*  HCT 37.1 32.0* 34.1*  MCV 106.0* 97.9 98.8  PLT 343 344 375    Basic Metabolic Panel: Recent Labs  Lab 02/11/21 0513 02/12/21 0532 02/13/21 0442 02/14/21 0504  NA 145 145 141 141  K 4.2 4.0 3.4* 3.3*  CL 116* 114* 111 110  CO2 21* 21* 20* 23  GLUCOSE 167* 153* 156* 161*  BUN 17 10 10 11   CREATININE 0.99 0.70 0.85 0.98  CALCIUM 9.0 9.2 8.9 8.9  MG 1.9 1.6* 1.8  --   PHOS 3.0 3.4  --   --     GFR: Estimated Creatinine Clearance: 61.6 mL/min (by C-G formula based on SCr of 0.98 mg/dL).  Liver Function Tests: Recent Labs  Lab 02/11/21 0513 02/12/21 0532  AST 24 26  ALT 18 23  ALKPHOS 85 90  BILITOT 0.6 0.7  PROT 7.1 6.9  ALBUMIN 2.7* 2.7*      HbA1C: Hgb A1c MFr Bld  Date/Time Value Ref Range Status  02/07/2021 05:46 AM 6.2 (H) 4.8 - 5.6 % Final    Comment:    (NOTE) Pre diabetes:          5.7%-6.4%  Diabetes:              >6.4%  Glycemic control for   <7.0% adults with diabetes   06/25/2015 05:06 AM 6.2 (H) 4.8 - 5.6 % Final    Comment:    (NOTE)         Pre-diabetes: 5.7 - 6.4         Diabetes: >6.4         Glycemic control for adults with diabetes: <7.0     CBG: Recent Labs  Lab 02/13/21 1625 02/13/21 2032 02/13/21 2301 02/14/21 0322 02/14/21 0816  GLUCAP 225* 169* 170* 146* 172*     Recent Results (from the past 240 hour(s))  Resp Panel by RT-PCR (Flu A&B, Covid) Nasopharyngeal Swab     Status: None   Collection Time: 02/06/21 11:55 AM   Specimen: Nasopharyngeal Swab; Nasopharyngeal(NP) swabs in vial transport medium  Result Value Ref Range Status   SARS Coronavirus 2 by RT PCR NEGATIVE NEGATIVE Final    Comment: (NOTE) SARS-CoV-2 target nucleic acids are NOT DETECTED.  The SARS-CoV-2 RNA is generally detectable in upper  respiratory specimens during the acute phase of infection. The lowest concentration of SARS-CoV-2 viral copies this assay can detect is 138 copies/mL. A negative result does not preclude SARS-Cov-2 infection and should not be used as the sole basis for treatment or other patient management decisions. A negative result may occur with  improper specimen collection/handling, submission of specimen other than nasopharyngeal swab, presence of viral mutation(s) within the areas targeted by this assay, and inadequate number of viral copies(<138 copies/mL). A negative result must be combined with clinical observations, patient history, and epidemiological information. The expected result is Negative.  Fact Sheet for Patients:  02/08/21  Fact Sheet for Healthcare Providers:  BloggerCourse.com  This test is no t yet approved or cleared by the [place] FDA and  has been authorized for detection and/or diagnosis of SARS-CoV-2 by FDA under an Emergency Use Authorization (EUA). This EUA will remain  in effect (meaning this test can  be used) for the duration of the COVID-19 declaration under Section 564(b)(1) of the Act, 21 U.S.C.section 360bbb-3(b)(1), unless the authorization is terminated  or revoked sooner.       Influenza A by PCR NEGATIVE NEGATIVE Final   Influenza B by PCR NEGATIVE NEGATIVE Final    Comment: (NOTE) The Xpert Xpress SARS-CoV-2/FLU/RSV plus assay is intended as an aid in the diagnosis of influenza from Nasopharyngeal swab specimens and should not be used as a sole basis for treatment. Nasal washings and aspirates are unacceptable for Xpert Xpress SARS-CoV-2/FLU/RSV testing.  Fact Sheet for Patients: BloggerCourse.com  Fact Sheet for Healthcare Providers: SeriousBroker.it  This test is not yet approved or cleared by the [place] FDA and has been  authorized for detection and/or diagnosis of SARS-CoV-2 by FDA under an Emergency Use Authorization (EUA). This EUA will remain in effect (meaning this test can be used) for the duration of the COVID-19 declaration under Section 564(b)(1) of the Act, 21 U.S.C. section 360bbb-3(b)(1), unless the authorization is terminated or revoked.  Performed at [facility], [address], [place], [place] [address]   MRSA Next Gen by PCR, Nasal     Status: None   Collection Time: 02/09/21  8:29 AM   Specimen: Nasal Mucosa; Nasal Swab  Result Value Ref Range Status   MRSA by PCR Next Gen NOT DETECTED NOT DETECTED Final    Comment: (NOTE) The GeneXpert MRSA Assay (FDA approved for NASAL specimens only), is one component of a comprehensive MRSA colonization surveillance program. It is not intended to diagnose MRSA infection nor to guide or monitor treatment for MRSA infections. Test performance is not FDA approved in patients less than 110 years old. Performed at [facility], [address] [place], [place] [address]   Aerobic/Anaerobic Culture w Gram Stain (surgical/deep wound)     Status: None (Preliminary result)   Collection Time: 02/11/21  1:56 PM   Specimen: Abscess  Result Value Ref Range Status   Specimen Description   Final    ABSCESS Performed at [facility], [address] [place], [place] [address]    Special Requests   Final    Normal Performed at [facility], [address] [place], [place] [address]    Gram Stain   Final    NO SQUAMOUS EPITHELIAL CELLS SEEN ABUNDANT WBC SEEN MODERATE GRAM NEGATIVE RODS MODERATE GRAM POSITIVE COCCI    Culture   Final    ABUNDANT GRAM NEGATIVE RODS CULTURE REINCUBATED FOR BETTER GROWTH Performed at [facility], [address] [place], [place] [address]    Report Status PENDING  Incomplete      Scheduled Meds:  enoxaparin (LOVENOX) injection  40  mg Subcutaneous Q24H   insulin aspart  0-5 Units Subcutaneous QHS   insulin aspart  0-9 Units Subcutaneous TID WC   metoprolol succinate  50 mg Oral Daily   mometasone-formoterol  2 puff Inhalation BID   pantoprazole  40 mg Oral Daily   polycarbophil  625 mg Oral BID   polyethylene glycol  17 g Oral Daily   QUEtiapine  12.5 mg Oral QHS   saccharomyces boulardii  250 mg Oral BID   sodium chloride flush  3 mL Intravenous Q12H   sodium chloride flush  5 mL Intracatheter Q8H   Continuous Infusions:  methocarbamol (ROBAXIN) IV 500 mg (02/14/21 0456)   piperacillin-tazobactam (ZOSYN)  IV 3.375 g (02/14/21 0351)     LOS: 7  days   Lonia Blood, MD [facility] Office  [phone] Pager - Text Page per Amion  If 7PM-7AM, please contact night-coverage per Amion 02/14/2021, 8:46 AM

## 2021-02-14 NOTE — Progress Notes (Signed)
Referring Physician(s): * No referring provider recorded for this case *  Supervising Physician: Marliss Coots  Patient Status:  [facility] - In-pt  Chief Complaint:  Post-op intra-abdominal fluid collection s/p drain placement  02/11/21  Subjective:   Pt denies pain/tenderness to insertion site. She states she is eating/drinking normally with no N/V.   Allergies: Buchu-cornsilk-ch grass-hydran, Celebrex [celecoxib], Ciprofloxacin, Cymbalta [duloxetine hcl], Dilaudid [hydromorphone hcl], Fentanyl and related, Glucophage [metformin hcl], Oxycodone, Pregabalin, Hydrocodone-acetaminophen, Morphine and related, Sulfa antibiotics, and Vicodin [hydrocodone-acetaminophen]  Medications: Prior to Admission medications   Medication Sig Start Date End Date Taking? Authorizing Provider  albuterol (PROVENTIL HFA;VENTOLIN HFA) 108 (90 BASE) MCG/ACT inhaler Inhale 2 puffs into the lungs every 4 (four) hours as needed for wheezing or shortness of breath.   Yes [provider]  amLODipine (NORVASC) 10 MG tablet Take 10 mg by mouth daily.    Yes [provider]  aspirin EC 81 MG tablet Take 81 mg by mouth daily.   Yes [provider]  budesonide-formoterol (SYMBICORT) 80-4.5 MCG/ACT inhaler Inhale 2 puffs into the lungs 2 (two) times daily.   Yes [provider]  cholecalciferol (VITAMIN D) 1000 UNITS tablet Take 1,000 Units by mouth daily.   Yes [provider]  cyanocobalamin 1000 MCG tablet Take 1,000 mcg by mouth daily.    Yes [provider]  dicyclomine (BENTYL) 20 MG tablet Take 20 mg 2 (two) times daily by mouth. 08/30/16 02/07/21 Yes [provider]  FLUoxetine (PROZAC) 20 MG capsule Take 20 mg by mouth daily.    Yes [provider]  fluticasone (FLONASE) 50 MCG/ACT nasal spray Place 2 sprays into both nostrils daily as needed for allergies or rhinitis.   Yes [provider]  glucagon 1 MG injection Inject 1 mg into  the vein once as needed (severe insulin reaction).   Yes [provider]  hydrocortisone (ANUSOL-HC) 2.5 % rectal cream Place 1 application rectally 3 (three) times daily as needed for hemorrhoids.    Yes [provider]  insulin glargine (LANTUS) 100 UNIT/ML injection Inject 50 Units into the skin at bedtime.   Yes [provider]  isosorbide mononitrate (IMDUR) 60 MG 24 hr tablet Take 60 mg by mouth daily.   Yes [provider]  lidocaine (XYLOCAINE) 5 % ointment Apply 1 application topically 4 (four) times daily as needed for moderate pain.   Yes [provider]  LIRAGLUTIDE [place] Inject 1.8 mg into the skin daily.   Yes [provider]  lisinopril (ZESTRIL) 5 MG tablet Take 5 mg by mouth daily. 08/02/16  Yes [provider]  loperamide (IMODIUM) 2 MG capsule Take 4 mg by mouth daily as needed for diarrhea or loose stools.   Yes [provider]  magnesium oxide (MAG-OX) 400 MG tablet Take 400 mg by mouth 2 (two) times daily.   Yes [provider]  metFORMIN (GLUCOPHAGE-XR) 500 MG 24 hr tablet Take 500 mg by mouth daily with breakfast.   Yes [provider]  metoprolol succinate (TOPROL-XL) 100 MG 24 hr tablet Take 100 mg by mouth daily. Take with or immediately following a meal.   Yes [provider]  Multiple Vitamin (MULTIVITAMIN WITH MINERALS) TABS tablet Take 1 tablet by mouth daily.   Yes [provider]  nitroGLYCERIN (NITROSTAT) 0.4 MG SL tablet Place 0.4 mg under the tongue every 5 (five) minutes as needed for chest pain.   Yes [provider]  Nitroglycerin 0.4 %  OINT Place 1 application rectally 2 (two) times daily as needed (for chest pain.).    Yes [provider]  pantoprazole (PROTONIX) 20 MG tablet Take 20 mg by mouth daily.   Yes [provider]  promethazine (PHENERGAN) 25 MG tablet Take 25 mg by mouth every 6 (six) hours as needed for nausea or  vomiting.   Yes [provider]  ranolazine (RANEXA) 500 MG 12 hr tablet Take 500 mg by mouth 2 (two) times daily.   Yes [provider]  rosuvastatin (CRESTOR) 10 MG tablet Take 10 mg by mouth daily.   Yes [provider]  temazepam (RESTORIL) 7.5 MG capsule Take 7.5 mg by mouth at bedtime as needed for sleep.   Yes [provider]  traMADol (ULTRAM) 50 MG tablet Take 1 tablet (50 mg total) every 6 (six) hours as needed by mouth. 02/15/17  Yes Bethel Born, PA-C  traZODone (DESYREL) 50 MG tablet Take 50 mg by mouth at bedtime.   Yes [provider]  triamcinolone cream (KENALOG) 0.5 % Apply 1 application topically 2 (two) times daily as needed (for skin irritation).    Yes [provider]  gabapentin (NEURONTIN) 300 MG capsule Take 600 mg by mouth 2 (two) times daily.    [provider]     Vital Signs: BP 128/74 (BP Location: Left Arm)   Pulse 84   Temp (!) 97.5 F (36.4 C) (Oral)   Resp 20   Ht 5\' 4"  (1.626 m)   Wt 200 lb (90.7 kg)   SpO2 94%   BMI 34.33 kg/m   Physical Exam Constitutional:      Appearance: She is not ill-appearing.  HENT:     Head: Normocephalic and atraumatic.  Eyes:     Pupils: Pupils are equal, round, and reactive to light.  Cardiovascular:     Rate and Rhythm: Normal rate.  Pulmonary:     Effort: Pulmonary effort is normal.  Abdominal:     Palpations: Abdomen is soft.     Tenderness: There is no abdominal tenderness. There is no guarding.     Comments: RUQ drain insertion site unremarkable with no drainage, bleeding, redness or other s/sx of infection. Sutures/statlock in place  Midline surgical wound with wound vac in place  Skin:    General: Skin is warm and dry.  Neurological:     Mental Status: She is alert and oriented to person, place, and time.  Psychiatric:        Mood and Affect: Mood normal.        Behavior: Behavior normal.        Thought Content: Thought content  normal.        Judgment: Judgment normal.    Imaging: CT ABDOMEN PELVIS W CONTRAST  Result Date: 02/10/2021 CLINICAL DATA:  Postoperative umbilical hernia repair. Abdominal pain and fever with leukocytosis. EXAM: CT ABDOMEN AND PELVIS WITH CONTRAST TECHNIQUE: Multidetector CT imaging of the abdomen and pelvis was performed using the standard protocol following bolus administration of intravenous contrast. CONTRAST:  31mL OMNIPAQUE IOHEXOL 350 MG/ML SOLN COMPARISON:  CT abdomen and pelvis 02/06/2021 FINDINGS: Lower chest: There is minimal atelectasis in the right lung base. Hepatobiliary: No focal liver abnormality is seen. Status post cholecystectomy. No biliary dilatation. Pancreas: Unremarkable. No pancreatic ductal dilatation or surrounding inflammatory changes. Spleen: Normal in size without focal abnormality. Adrenals/Urinary Tract: There is bilateral adrenal thickening similar to the prior examination. The bilateral kidneys are within normal limits.  Patient is status post cystectomy. There is a right lower quadrant urinary diversion which is distended with fluid. There is a stable 14 mm calcification within the urinary diversion. Stomach/Bowel: Partial right colectomy changes are again seen. There is sigmoid colon diverticulosis without evidence for diverticulitis. There are mildly dilated loops of small bowel proximally, but distal small bowel loops appear nondilated. Oral contrast is seen to reach distal small bowel. Patient is status post umbilical hernia repair. There is an abnormal loop of small bowel at the level of the umbilicus demonstrating wall thickening with a portion of this bowel approximating the anterior abdominal wall (images 2/48 through 54). There is no pneumatosis. There is mesenteric edema and fluid adjacent to small bowel loops at the surgical level. Nasogastric tube tip is in the distal stomach. The stomach is nondilated. Vascular/Lymphatic: Aortic atherosclerosis. No enlarged  abdominal or pelvic lymph nodes. Reproductive: Status post hysterectomy. No adnexal masses. Other: Patient is status post umbilical hernia repair. There is no evidence for recurrent hernia. There is a small amount of subcutaneous fluid and air at the level of the umbilicus compatible with recent surgery. There is also some skin thickening and edema of the left anterior abdominal wall, nonspecific. Subcutaneous air in the left abdominal wall may be related to medication injection sites. There are intraperitoneal fluid pockets containing a small amount of air approximating small-bowel loops in the anterior abdominal wall. The largest pocket is seen along the anterior and midline abdominal wall at the level of the umbilicus measuring 11 x 21 by 10 cm. There is no definitive wall enhancement. There also a few small foci of free intraperitoneal air in the anterior upper abdomen. Musculoskeletal: No acute or significant osseous findings. IMPRESSION: 1. Status post umbilical hernia repair. There is a small amount of free air in the upper abdomen. 2. Short segment small bowel wall thickening with associated mesenteric edema in the anterior abdomen at the surgical level worrisome for nonspecific enteritis. 3. Intraperitoneal fluid collection containing air along the anterior abdominal wall approximating inflamed small bowel loops. Differential diagnosis includes abscess or other postoperative fluid collection. Small bowel perforation or fistula cannot be excluded given that there are inflamed small bowel loops at this level. 4. Nasogastric tube tip in the distal stomach. 5. Urinary diversion appears uncomplicated. No hydronephrosis. Stable calculus in the urinary diversion. 6. Sigmoid colon diverticulosis. 7.  Aortic Atherosclerosis (ICD10-I70.0). Electronically Signed   By: Darliss Cheney M.D.   On: 02/10/2021 15:25   CT IMAGE GUIDED DRAINAGE BY PERCUTANEOUS CATHETER  Result Date: 02/11/2021 CLINICAL DATA:  Status post  repair of incarcerated ventral hernia with development of postoperative fluid collection of the anterior peritoneal cavity immediately deep to the abdominal wall. EXAM: CT GUIDED CATHETER DRAINAGE OF PERITONEAL ABSCESS ANESTHESIA/SEDATION: Moderate (conscious) sedation was employed during this procedure. A total of Versed 1.5 mg and Fentanyl 75 mcg was administered intravenously by radiology nursing under my supervision. Moderate Sedation Time: 42 minutes. The patient's level of consciousness and vital signs were monitored continuously by radiology nursing throughout the procedure under my direct supervision. PROCEDURE: The procedure, risks, benefits, and alternatives were explained to the patient. Questions regarding the procedure were encouraged and answered. The patient understands and consents to the procedure. A time out was performed prior to initiating the procedure. The abdominal wall was prepped with chlorhexidine in a sterile fashion, and a sterile drape was applied covering the operative field. A sterile gown and sterile gloves were used for the procedure. Local anesthesia  was provided with 1% Lidocaine. Under CT guidance, an 18 gauge trocar needle was advanced into an anterior peritoneal fluid collection from a right-sided anterior approach. After confirming needle tip position and aspiration of fluid via the needle, a guidewire was advanced and the needle removed. The tract was dilated and a 10 French percutaneous drainage catheter placed. The catheter was formed and attached to a suction bulb. Additional CT was performed. A fluid sample was sent for culture analysis. The catheter was secured at the skin with a Prolene retention suture and adhesive StatLock device. COMPLICATIONS: None FINDINGS: Aspiration at the level of the anterior peritoneal fluid collection just to the right of midline yielded purulent fluid. There was good return of fluid after drain placement. IMPRESSION: CT-guided percutaneous  catheter drainage of anterior peritoneal abscess yielding purulent fluid. A fluid sample was sent for culture analysis. A 10 French drain was placed and attached to suction bulb drainage. Electronically Signed   By: Irish Lack M.D.   On: 02/11/2021 14:18    Labs:  CBC: Recent Labs    02/11/21 0513 02/12/21 0532 02/13/21 0442 02/14/21 0504  WBC 21.3* 18.0* 14.0* 13.0*  HGB 10.9* 11.2* 10.5* 11.0*  HCT 34.7* 37.1 32.0* 34.1*  PLT 389 343 344 375    COAGS: No results for input(s): INR, APTT in the last 8760 hours.  BMP: Recent Labs    02/11/21 0513 02/12/21 0532 02/13/21 0442 02/14/21 0504  NA 145 145 141 141  K 4.2 4.0 3.4* 3.3*  CL 116* 114* 111 110  CO2 21* 21* 20* 23  GLUCOSE 167* 153* 156* 161*  BUN 17 10 10 11   CALCIUM 9.0 9.2 8.9 8.9  CREATININE 0.99 0.70 0.85 0.98  GFRNONAA >60 >60 >60 >60    LIVER FUNCTION TESTS: Recent Labs    02/06/21 1315 02/07/21 0546 02/11/21 0513 02/12/21 0532  BILITOT 0.4 0.7 0.6 0.7  AST 22 25 24 26   ALT 18 18 18 23   ALKPHOS 58 55 85 90  PROT 8.0 6.7 7.1 6.9  ALBUMIN 4.1 3.3* 2.7* 2.7*    Assessment and Plan:  Pt noted to be sitting upright in bed eating breakfast.  She is A&O, pleasant and in NAD.  RUQ drain site unremarkable with no drainage, bleeding or redness noted. Sutures/statlock in place.  Scant amt yellow drainage to JP with 100 cc documented last 24 hours in Epic. Drain flushed/aspirates easily.  Pt is afebrile, VSS. WBC trending down.   Electronically Signed: 13/06/22, NP 02/14/2021, 10:15 AM   I spent a total of 15 Minutes at the the patient's bedside AND on the patient's hospital floor or unit, greater than 50% of which was counseling/coordinating care for post-op intra-abdominal fluid collection s/p drain placement 02/11/21.

## 2021-02-14 NOTE — Progress Notes (Signed)
Physical Therapy Treatment Patient Details Name: Laurie [name] MRN: [id number] DOB: 01/28/1956 Today's Date: 02/14/2021   History of Present Illness Laurie [name] is a 65 y.o. female with a history of T2DM, HTN, HLD, anxiety/depression, GERD, obesity and multiple abdominopelvic surgeries who presented to the ED 02/06/21 with abdominal pain , CT revealed an incarcerated ventral hernia. S/P small bowell resection, hernia repair and lysis of adhesions 02/06/21. patient underwent drain placement for anterior peirtoneal abcess on 11/5.    PT Comments    Patient making steady progress with acute PT and ambulated ~160' today despite pain limitations. No overt LOB noted and pt maintained safe position to walker. Educated pt on technique for log roll to improve independence with bed mobility. Educated on benefits of mobilizing and encouraged pt to ambulate ~3x/day with RN/NT staff. Acute PT will continue to progress pt as able during stay. Plan to introduce HEP as pt's pain improves.    Recommendations for follow up therapy are one component of a multi-disciplinary discharge planning process, led by the attending physician.  Recommendations may be updated based on patient status, additional functional criteria and insurance authorization.  Follow Up Recommendations  Home health PT     Assistance Recommended at Discharge Intermittent Supervision/Assistance  Equipment Recommendations  None recommended by PT    Recommendations for Other Services       Precautions / Restrictions Precautions Precautions: Fall;Other (comment) Precaution Comments: abdomen, wound vac Restrictions Weight Bearing Restrictions: No     Mobility  Bed Mobility Overal bed mobility: Needs Assistance Bed Mobility: Rolling;Sidelying to Sit Rolling: Min assist Sidelying to sit: Min assist;HOB elevated       General bed mobility comments: cues for log roll technique due to abdominla surgery and pain, min  assist to complete roll and raise trunk upright.    Transfers Overall transfer level: Needs assistance Equipment used: Rolling walker (2 wheels)   Sit to Stand: Min guard           General transfer comment: VCs hand placement, no assist to rise or control lower to sit in recliner.    Ambulation/Gait Ambulation/Gait assistance: Min guard Gait Distance (Feet): 160 Feet Assistive device: Rolling walker (2 wheels) Gait Pattern/deviations: Step-to pattern;Decreased stride length;Narrow base of support Gait velocity: decreased     General Gait Details: slow but steady, no loss of balance, trunk flexed slightly due to pain and pt stopping 2x for rest break.   Stairs             Wheelchair Mobility    Modified Rankin (Stroke Patients Only)       Balance Overall balance assessment: Needs assistance Sitting-balance support: Feet supported Sitting balance-Leahy Scale: Good     Standing balance support: Reliant on assistive device for balance;During functional activity;Bilateral upper extremity supported;Single extremity supported Standing balance-Leahy Scale: Fair Standing balance comment: reliant on UE support                            Cognition Arousal/Alertness: Awake/alert Behavior During Therapy: WFL for tasks assessed/performed Overall Cognitive Status: Within Functional Limits for tasks assessed                                          Exercises      General Comments        Pertinent Vitals/Pain Pain Assessment:  Faces Faces Pain Scale: Hurts little more Pain Location: abdomen Pain Descriptors / Indicators: Grimacing;Guarding Pain Intervention(s): Limited activity within patient's tolerance;Repositioned;Ice applied    Home Living                          Prior Function            PT Goals (current goals can now be found in the care plan section) Acute Rehab PT Goals Patient Stated Goal: eat and drink,  go home PT Goal Formulation: With patient/family Time For Goal Achievement: 02/21/21 Potential to Achieve Goals: Good Progress towards PT goals: Progressing toward goals    Frequency    Min 3X/week      PT Plan Current plan remains appropriate    Co-evaluation              AM-PAC PT "6 Clicks" Mobility   Outcome Measure  Help needed turning from your back to your side while in a flat bed without using bedrails?: A Little Help needed moving from lying on your back to sitting on the side of a flat bed without using bedrails?: A Little Help needed moving to and from a bed to a chair (including a wheelchair)?: A Little Help needed standing up from a chair using your arms (e.g., wheelchair or bedside chair)?: A Little Help needed to walk in hospital room?: A Little Help needed climbing 3-5 steps with a railing? : A Lot 6 Click Score: 17    End of Session Equipment Utilized During Treatment: Gait belt Activity Tolerance: Patient tolerated treatment well Patient left: in chair;with call bell/phone within reach;with family/visitor present;with chair alarm set Nurse Communication: Mobility status PT Visit Diagnosis: Unsteadiness on feet (R26.81);Pain     Time: 9242-6834 PT Time Calculation (min) (ACUTE ONLY): 31 min  Charges:  $Gait Training: 8-22 mins $Therapeutic Activity: 8-22 mins                     Wynn Maudlin, DPT Acute Rehabilitation Services Office [phone] Pager [phone]    Anitra Lauth 02/14/2021, 4:23 PM

## 2021-02-14 NOTE — Progress Notes (Signed)
Patient ID: Laurie [name], female   DOB: [birth year], 65 y.o.   MRN: [id number] [facility] Progress Note  8 Days Post-Op  Subjective: CC-  Still having some abdominal discomfort. Nausea at times, no emesis. Passing flatus and had a good BM yesterday. Tolerating diet but not eating much. WBC continues to downtrend 13, afebrile.  Objective: Vital signs in last 24 hours: Temp:  [97.5 F (36.4 C)-98 F (36.7 C)] 97.5 F (36.4 C) (11/08 0800) Pulse Rate:  [71-84] 84 (11/08 0800) Resp:  [20] 20 (11/08 0800) BP: (128-159)/(62-83) 128/74 (11/08 0800) SpO2:  [94 %-98 %] 94 % (11/08 0800) Last BM Date: 02/13/21  Intake/Output from previous day: 11/07 0701 - 11/08 0700 In: -  Out: 780 [Urine:650; Drains:130] Intake/Output this shift: Total I/O In: -  Out: 50 [Drains:50]  PE: Gen:  Alert, NAD, pleasant Pulm: rate and effort normal Abd: vac to midline with good seal, soft, nondistended, mild right sided TTP around drain, drain purulent ?feculent (Picture during vac change 02/13/21)    Lab Results:  Recent Labs    02/13/21 0442 02/14/21 0504  WBC 14.0* 13.0*  HGB 10.5* 11.0*  HCT 32.0* 34.1*  PLT 344 375   BMET Recent Labs    02/13/21 0442 02/14/21 0504  NA 141 141  K 3.4* 3.3*  CL 111 110  CO2 20* 23  GLUCOSE 156* 161*  BUN 10 11  CREATININE 0.85 0.98  CALCIUM 8.9 8.9   PT/INR No results for input(s): LABPROT, INR in the last 72 hours. CMP     Component Value Date/Time   NA 141 02/14/2021 0504   K 3.3 (L) 02/14/2021 0504   CL 110 02/14/2021 0504   CO2 23 02/14/2021 0504   GLUCOSE 161 (H) 02/14/2021 0504   BUN 11 02/14/2021 0504   CREATININE 0.98 02/14/2021 0504   CALCIUM 8.9 02/14/2021 0504   PROT 6.9 02/12/2021 0532   ALBUMIN 2.7 (L) 02/12/2021 0532   AST 26 02/12/2021 0532   ALT 23 02/12/2021 0532   ALKPHOS 90 02/12/2021 0532   BILITOT 0.7 02/12/2021 0532   GFRNONAA >60 02/14/2021 0504   GFRAA 33 (L) 07/01/2015 0352   Lipase      Component Value Date/Time   LIPASE 34 02/06/2021 1315       Studies/Results: No results found.  Anti-infectives: Anti-infectives (From admission, onward)    Start     Dose/Rate Route Frequency Ordered Stop   02/12/21 1200  piperacillin-tazobactam (ZOSYN) IVPB 3.375 g        3.375 g 12.5 mL/hr over 240 Minutes Intravenous Every 8 hours 02/12/21 0950     02/06/21 2003  ceFAZolin (ANCEF) 2-4 GM/100ML-% IVPB       Note to Pharmacy: Ponciano Ort   : cabinet override      02/06/21 2003 02/06/21 2046   02/06/21 1945  ceFAZolin (ANCEF) IVPB 2g/100 mL premix        2 g 200 mL/hr over 30 Minutes Intravenous On call to O.R. 02/06/21 1914 02/06/21 2028        Assessment/Plan Ventral hernia with SBO POD#8 S/P ventral hernia repair with small bowel resection, LOA 10/31 Dr. 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

## 2021-02-14 NOTE — Progress Notes (Signed)
Pharmacy Antibiotic Note  Laurie [name] is a 65 y.o. female presented to the ED on 02/06/2021 with c/o abdominal pain and diarrhea.  Abdominal CT on 02/06/21 showed SBO secondary to an incarcerated ventral hernia. She underwent exp lap with lysis of adhesions, hernia repair and small bowel resection on 10/31.  Abdominal CT on 11/4 showed findings with concern for fluid collection in the abdomen. She underwent CT guided drainage of anterior peritoneal abscess on 11/5.  Zosyn started on 11/6 for infection.  Today, 02/14/2021: - day #3 abx - afeb, wbc elevated but trending down - scr 0.98 (crcl~61)  Plan: - continue zosyn 3.375 gm IV q8h (infuse over 4 hours) - pharmacy will sign off for abx. Re-consult [place] if need further assistance  _____________________________________  Height: 5\' 4"  (162.6 cm) Weight: 90.7 kg (200 lb) IBW/kg (Calculated) : 54.7  Temp (24hrs), Avg:98 F (36.7 C), Min:97.9 F (36.6 C), Max:98 F (36.7 C)  Recent Labs  Lab 02/10/21 0444 02/11/21 0513 02/12/21 0532 02/13/21 0442 02/14/21 0504  WBC 27.3* 21.3* 18.0* 14.0* 13.0*  CREATININE 1.15* 0.99 0.70 0.85 0.98    Estimated Creatinine Clearance: 61.6 mL/min (by C-G formula based on SCr of 0.98 mg/dL).    Allergies  Allergen Reactions   Buchu-[place]sil[place]-Ch Grass-Hydran Other (See Comments)    Dehydration. Elevated creatini Other reaction(s): Other (See Comments) Dehydration, increase in creatinine   Celebrex [Celecoxib] Itching   Ciprofloxacin Other (See Comments)    No history of rash or SOB. Had kidney problems when she took cipro but was not told she had interstitial nephritis. Other reaction(s): Other (See Comments), Unknown Kidney problems   Cymbalta [Duloxetine Hcl]     GI upset / constipation.    Dilaudid [Hydromorphone Hcl] Itching   Fentanyl And Related Itching   Glucophage [Metformin Hcl] Other (See Comments)    Stopped due to increase creat   Oxycodone Itching   Pregabalin Other (See  Comments)    CNS disorder. Headache Other reaction(s): Other (See Comments) Headache, CNS disorder   Hydrocodone-Acetaminophen Rash   Morphine And Related Rash and Itching   Sulfa Antibiotics Rash   Vicodin [Hydrocodone-Acetaminophen] Rash    10/31 Ancef x 1 11/6 Zosyn>>   11/5 abscess: mod GNR, mod GPC 11/3 MRSA neg  Thank you for allowing pharmacy to be a part of this patient's care.  13/3 02/14/2021 8:13 AM

## 2021-02-15 LAB — BASIC METABOLIC PANEL
Anion gap: 7 (ref 5–15)
BUN: 18 mg/dL (ref 8–23)
CO2: 21 mmol/L — ABNORMAL LOW (ref 22–32)
Calcium: 9 mg/dL (ref 8.9–10.3)
Chloride: 111 mmol/L (ref 98–111)
Creatinine, Ser: 1.4 mg/dL — ABNORMAL HIGH (ref 0.44–1.00)
GFR, Estimated: 41 mL/min — ABNORMAL LOW (ref >60)
Glucose, Bld: 160 mg/dL — ABNORMAL HIGH (ref 70–99)
Potassium: 3.7 mmol/L (ref 3.5–5.1)
Sodium: 139 mmol/L (ref 135–145)

## 2021-02-15 LAB — CBC
HCT: 34.8 % — ABNORMAL LOW (ref 36.0–46.0)
Hemoglobin: 11.1 g/dL — ABNORMAL LOW (ref 12.0–15.0)
MCH: 32.2 pg (ref 26.0–34.0)
MCHC: 31.9 g/dL (ref 30.0–36.0)
MCV: 100.9 fL — ABNORMAL HIGH (ref 80.0–100.0)
Platelets: 397 10*3/uL (ref 150–400)
RBC: 3.45 MIL/uL — ABNORMAL LOW (ref 3.87–5.11)
RDW: 14.3 % (ref 11.5–15.5)
WBC: 13.6 10*3/uL — ABNORMAL HIGH (ref 4.0–10.5)
nRBC: 0 % (ref 0.0–0.2)

## 2021-02-15 LAB — AEROBIC/ANAEROBIC CULTURE W GRAM STAIN (SURGICAL/DEEP WOUND)
Gram Stain: NONE SEEN
Special Requests: NORMAL

## 2021-02-15 LAB — GLUCOSE, CAPILLARY
Glucose-Capillary: 181 mg/dL — ABNORMAL HIGH (ref 70–99)
Glucose-Capillary: 149 mg/dL — ABNORMAL HIGH (ref 70–99)
Glucose-Capillary: 175 mg/dL — ABNORMAL HIGH (ref 70–99)
Glucose-Capillary: 154 mg/dL — ABNORMAL HIGH (ref 70–99)

## 2021-02-15 LAB — MAGNESIUM: Magnesium: 1.8 mg/dL (ref 1.7–2.4)

## 2021-02-15 MED ORDER — MELATONIN 3 MG PO TABS
3.0000 mg | ORAL_TABLET | Freq: Every day | ORAL | Status: DC
  Administered 2021-02-15 – 2021-02-26 (×12): 3 mg via ORAL
  Filled 2021-02-15 (×12): qty 1

## 2021-02-15 NOTE — Progress Notes (Signed)
Occupational Therapy Treatment Patient Details Name: Laurie [name] MRN: [id number] DOB: April 03, 1956 Today's Date: 02/15/2021   History of present illness Laurie [name] is a 65 y.o. female with a history of T2DM, HTN, HLD, anxiety/depression, GERD, obesity and multiple abdominopelvic surgeries who presented to the ED 02/06/21 with abdominal pain , CT revealed an incarcerated ventral hernia. S/P small bowell resection, hernia repair and lysis of adhesions 02/06/21. patient underwent drain placement for anterior peirtoneal abcess on 11/5.   OT comments  Due to pts reported increase in abdominal pain and declining functional mobility with mobility tech, focused pt session on UE exercises and grooming in sitting. Pt sat EOB for 10 minutes performing grooming with set up and seated UE exercises (see below). Pt performed sit to stand and took 2 steps to [facility] with min guard, and log rolling with supervision. Continuing to recommend [place] post-d/c, will continue to follow in the acute setting.    Recommendations for follow up therapy are one component of a multi-disciplinary discharge planning process, led by the attending physician.  Recommendations may be updated based on patient status, additional functional criteria and insurance authorization.    Follow Up Recommendations  Home health OT    Assistance Recommended at Discharge Frequent or constant Supervision/Assistance  Equipment Recommendations  Other (comment) (total hip kit)    Recommendations for Other Services      Precautions / Restrictions Precautions Precautions: Fall Precaution Comments: abdomen, wound vac Restrictions Weight Bearing Restrictions: No       Mobility Bed Mobility Overal bed mobility: Needs Assistance Bed Mobility: Rolling;Sit to Sidelying Rolling: Supervision       Sit to sidelying: Supervision General bed mobility comments: min verbal cues for log roll technique.    Transfers Overall transfer  level: Needs assistance Equipment used: Rolling walker (2 wheels) Transfers: Sit to/from Stand Sit to Stand: Min guard           General transfer comment: no physical assist     Balance Overall balance assessment: Needs assistance Sitting-balance support: Feet supported Sitting balance-Leahy Scale: Good     Standing balance support: Reliant on assistive device for balance;During functional activity;Bilateral upper extremity supported;Single extremity supported Standing balance-Leahy Scale: Fair Standing balance comment: no support for taking two steps towards [facility]                           ADL either performed or assessed with clinical judgement   ADL Overall ADL's : Needs assistance/impaired     Grooming: Oral care;Sitting;Set up Grooming Details (indicate cue type and reason): sat to perform oral care due to increased abdominal pain.                                    Extremity/Trunk Assessment              Vision       Perception     Praxis      Cognition Arousal/Alertness: Awake/alert Behavior During Therapy: WFL for tasks assessed/performed Overall Cognitive Status: Within Functional Limits for tasks assessed                                            Exercises Exercises: Other exercises;General Upper Extremity Other Exercises Other Exercises: arm pumps  outward;10x each;both UEs Other Exercises: vertical arm pumps/shoulder flexion;10x each;BUEs   Shoulder Instructions       General Comments      Pertinent Vitals/ Pain       Pain Assessment: 0-10 Pain Score: 7  Pain Location: abdomen Pain Descriptors / Indicators: Grimacing;Guarding Pain Intervention(s): Limited activity within patient's tolerance;Monitored during session;Patient requesting pain meds-RN notified  Home Living                                          Prior Functioning/Environment              Frequency  Min  2X/week        Progress Toward Goals  OT Goals(current goals can now be found in the care plan section)  Progress towards OT goals: Progressing toward goals  Acute Rehab OT Goals Patient Stated Goal: to self cath OT Goal Formulation: With patient Time For Goal Achievement: 02/22/21 Potential to Achieve Goals: Good ADL Goals Pt Will Perform Lower Body Dressing: with adaptive equipment;with modified independence;sit to/from stand Additional ADL Goal #1: Patient will perform 10 min functional activity or exercise activity as evidence of improving activity tolerance  Plan Discharge plan remains appropriate    Co-evaluation                 AM-PAC OT "6 Clicks" Daily Activity     Outcome Measure   Help from another person eating meals?: None Help from another person taking care of personal grooming?: A Little Help from another person toileting, which includes using toliet, bedpan, or urinal?: A Little Help from another person bathing (including washing, rinsing, drying)?: A Lot Help from another person to put on and taking off regular upper body clothing?: A Little Help from another person to put on and taking off regular lower body clothing?: A Little 6 Click Score: 18    End of Session    OT Visit Diagnosis: Unsteadiness on feet (R26.81);Other abnormalities of gait and mobility (R26.89)   Activity Tolerance Patient limited by pain   Patient Left in bed;with call bell/phone within reach;with bed alarm set   Nurse Communication Mobility status (pt requesting pain meds)        Time: 6811-5726 OT Time Calculation (min): 18 min  Charges: OT General Charges $OT Visit: 1 Visit OT Treatments $Therapeutic Exercise: 8-22 mins  Jackquline Denmark, OTS Acute Rehab Office: [phone]     02/15/2021, 4:01 PM

## 2021-02-15 NOTE — Progress Notes (Signed)
Patient ID: Laurie [name], female   DOB: 02-08-1956, 65 y.o.   MRN: [id number] [facility] Surgery Progress Note  9 Days Post-Op  Subjective: CC-  Continues to have intermittent abdominal pain. Not worse but not significantly better. Some nausea, no emesis. Not eating much. Passing flatus. Last BM 2 days ago. WBC up 13.6, afebrile  Objective: Vital signs in last 24 hours: Temp:  [97.7 F (36.5 C)-98 F (36.7 C)] 97.7 F (36.5 C) (11/09 0316) Pulse Rate:  [72-79] 74 (11/09 0316) Resp:  [20] 20 (11/09 0316) BP: (116-138)/(65-79) 117/71 (11/09 0316) SpO2:  [95 %-97 %] 96 % (11/09 0316) Last BM Date: 02/14/21  Intake/Output from previous day: 11/08 0701 - 11/09 0700 In: 5  Out: 105 [Drains:105] Intake/Output this shift: No intake/output data recorded.  PE: Gen:  Alert, NAD, pleasant Pulm: rate and effort normal Abd: vac to midline with good seal, soft, nondistended, mild diffuse TTP more significant on the right around drain, drain output yellow  Lab Results:  Recent Labs    02/14/21 0504 02/15/21 0508  WBC 13.0* 13.6*  HGB 11.0* 11.1*  HCT 34.1* 34.8*  PLT 375 397   BMET Recent Labs    02/14/21 0504 02/15/21 0508  NA 141 139  K 3.3* 3.7  CL 110 111  CO2 23 21*  GLUCOSE 161* 160*  BUN 11 18  CREATININE 0.98 1.40*  CALCIUM 8.9 9.0   PT/INR No results for input(s): LABPROT, INR in the last 72 hours. CMP     Component Value Date/Time   NA 139 02/15/2021 0508   K 3.7 02/15/2021 0508   CL 111 02/15/2021 0508   CO2 21 (L) 02/15/2021 0508   GLUCOSE 160 (H) 02/15/2021 0508   BUN 18 02/15/2021 0508   CREATININE 1.40 (H) 02/15/2021 0508   CALCIUM 9.0 02/15/2021 0508   PROT 6.9 02/12/2021 0532   ALBUMIN 2.7 (L) 02/12/2021 0532   AST 26 02/12/2021 0532   ALT 23 02/12/2021 0532   ALKPHOS 90 02/12/2021 0532   BILITOT 0.7 02/12/2021 0532   GFRNONAA 41 (L) 02/15/2021 0508   GFRAA 33 (L) 07/01/2015 0352   Lipase     Component Value Date/Time    LIPASE 34 02/06/2021 1315       Studies/Results: No results found.  Anti-infectives: Anti-infectives (From admission, onward)    Start     Dose/Rate Route Frequency Ordered Stop   02/12/21 1200  piperacillin-tazobactam (ZOSYN) IVPB 3.375 g        3.375 g 12.5 mL/hr over 240 Minutes Intravenous Every 8 hours 02/12/21 0950     02/06/21 2003  ceFAZolin (ANCEF) 2-4 GM/100ML-% IVPB       Note to Pharmacy: Ponciano Ort   : cabinet override      02/06/21 2003 02/06/21 2046   02/06/21 1945  ceFAZolin (ANCEF) IVPB 2g/100 mL premix        2 g 200 mL/hr over 30 Minutes Intravenous On call to O.R. 02/06/21 1914 02/06/21 2028        Assessment/Plan Ventral hernia with SBO POD#9 S/P ventral hernia repair with small bowel resection, LOA 10/31 Dr. Sheliah Hatch - wound vac MWF - will need vac at home, order placed - s/p IR drain 11/5, culture E coli, report pending, continue IV antibiotics - continue drain and monitor output - WBC slightly up. 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

## 2021-02-15 NOTE — Progress Notes (Signed)
   02/15/21 1544  Mobility  Activity Contraindicated/medical hold   Upon entry pt was agreeable to ambulate and required Min A to sit EOB. Pt reported 7/10 pain while sitting. Pt stood using RW and rated pain as 10/10 and requested to return to bed and hold mobility. Pt was left sitting EOB as OT entered room and began OT session. Will check back on pt as schedule permits.   Arliss Journey Mobility Specialist Acute Rehabilitation Services Phone: [phone] 02/15/21, 3:50 PM

## 2021-02-15 NOTE — Progress Notes (Signed)
Pt refused the in and out cath at 600 and asked to do it around 8.

## 2021-02-15 NOTE — Plan of Care (Signed)

## 2021-02-15 NOTE — Progress Notes (Signed)
The patient is injury-free, afebrile, alert, and oriented X 3. Vital signs were within the baseline during this shift. She complained of back and abdominal pain, PRNs were given.  Pt denies chest pain, SOB, nausea, vomiting, dizziness, signs or symptoms of bleeding or acute changes during this shift. We will continue to monitor and work toward achieving the care plan goals.

## 2021-02-15 NOTE — Care Management Important Message (Signed)
Important Message  Patient Details IM Letter given to the Patient. Name: Laurie [name] MRN: [id number] Date of Birth: 19-May-1955   Medicare Important Message Given:  Yes     [name] 02/15/2021, 10:50 AM

## 2021-02-15 NOTE — Progress Notes (Signed)
Laurie [name]  WNU:[id number] DOB: [birth year] DOA: 02/06/2021 PCP: Margit Hanks, MD    Brief Narrative:  2KV w/ a hx of DM2, HTN, HLD, anxiety/depression, GERD, obesity, and multiple abdomino-pelvic surgeries who presented to the ED with 5 days of worsening lower abdominal pain associated with loose stools and vomiting. In the ED CT revealed an incarcerated ventral hernia. EDP attempted reduction though symptoms did not improve. Labs notable for acute renal failure in the setting of dehydration. She was transferred to [facility] ED for surgical consult, and repeat CT abdomen showed persistent hernia with bowel obstruction for which she was taken to the OR.   Today, pt denies any new complaints, continues to have intermittent abd pain, denies any N/V.   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 Vonita Moss at [facility] - history of cystectomy and right colon pouch for interstitial cystitis 1990s - pouch stone is found to be nonobstructing  DM2 SSI, accuchecks  HTN BP stable  COPD Stable Continue usual Dulera and as needed albuterol  GERD PPI  Obesity - Body mass index is 34.33 kg/m. Lifestyle modification advised    Significant events: 10/31 ventral hernia repair with small bowel resection and LOA 11/5 CT-guided drainage of anterior peritoneal abscess in IR -10 French drain left in place none  Consultants:   General Surgery IR  Code Status: FULL CODE  Antimicrobials:  Zosyn 11/6 >  DVT prophylaxis: Lovenox  Family Communication: None at bedside  Status is: Inpatient -ultimate disposition will be discharge home -she has many family members and an extensive support network   Objective: Blood pressure 127/84, pulse 76, temperature 98.6 F (37 C), temperature source Oral, resp. rate 18, height 5\' 4"  (1.626 m), weight 90.7 kg, SpO2 99 %.  Intake/Output Summary (Last 24 hours) at 02/15/2021 1911 Last data filed at 02/15/2021 1319 Gross per 24 hour  Intake 1682 ml  Output 1630 ml  Net 52 ml   Filed Weights   02/06/21 1053  Weight: 90.7 kg    Examination: General: NAD  Cardiovascular: S1, S2 present Respiratory: CTAB Abdomen: Soft, mildly tender, nondistended, bowel sounds present, incision clean/dry/intact Musculoskeletal: No bilateral pedal edema noted Skin: As above Psychiatry: Normal mood    CBC: Recent Labs  Lab 02/13/21 0442 02/14/21 0504 02/15/21 0508  WBC 14.0* 13.0* 13.6*  HGB 10.5* 11.0* 11.1*  HCT 32.0* 34.1* 34.8*  MCV 97.9 98.8 100.9*  PLT 344 375 397   Basic Metabolic Panel: Recent Labs  Lab 02/11/21 0513 02/12/21 0532 02/13/21 0442 02/14/21 0504 02/15/21 0508  NA 145 145 141 141 139  K 4.2 4.0 3.4* 3.3* 3.7  CL 116* 114* 111 110 111  CO2 21* 21* 20* 23 21*  GLUCOSE 167* 153* 156* 161* 160*  BUN 17 10 10 11 18   CREATININE 0.99 0.70 0.85 0.98 1.40*  CALCIUM 9.0 9.2 8.9 8.9 9.0  MG 1.9 1.6* 1.8  --  1.8  PHOS 3.0 3.4  --   --   --  GFR: Estimated Creatinine Clearance: 43.1 mL/min (A) (by C-G formula based on SCr of 1.4 mg/dL (H)).  Liver Function Tests: Recent Labs  Lab 02/11/21 0513 02/12/21 0532  AST 24 26  ALT 18 23  ALKPHOS 85 90  BILITOT 0.6 0.7  PROT 7.1 6.9  ALBUMIN 2.7* 2.7*  No results for input(s): LIPASE, AMYLASE in the last 168 hours.   HbA1C: Hgb A1c MFr Bld  Date/Time Value Ref Range Status  02/07/2021 05:46  AM 6.2 (H) 4.8 - 5.6 % Final    Comment:    (NOTE) Pre diabetes:          5.7%-6.4%  Diabetes:              >6.4%  Glycemic control for   <7.0% adults with diabetes   06/25/2015 05:06 AM 6.2 (H) 4.8 - 5.6 % Final    Comment:    (NOTE)         Pre-diabetes: 5.7 - 6.4         Diabetes: >6.4         Glycemic control for adults with diabetes: <7.0     CBG: Recent Labs  Lab 02/14/21 1639 02/14/21 2116 02/15/21 0755 02/15/21 1222 02/15/21 1631  GLUCAP 190* 203* 181* 149* 175*    Recent Results (from the past 240 hour(s))  Resp Panel by RT-PCR (Flu A&B, Covid) Nasopharyngeal Swab     Status: None   Collection Time: 02/06/21 11:55 AM   Specimen: Nasopharyngeal Swab; Nasopharyngeal(NP) swabs in vial transport medium  Result Value Ref Range Status   SARS Coronavirus 2 by RT PCR NEGATIVE NEGATIVE Final    Comment: (NOTE) SARS-CoV-2 target nucleic acids are NOT DETECTED.  The SARS-CoV-2 RNA is generally detectable in upper respiratory specimens during the acute phase of infection. The lowest concentration of SARS-CoV-2 viral copies this assay can detect is 138 copies/mL. A negative result does not preclude SARS-Cov-2 infection and should not be used as the sole basis for treatment or other patient management decisions. A negative result may occur with  improper specimen collection/handling, submission of specimen other than nasopharyngeal swab, presence of viral mutation(s) within the areas targeted by this assay, and inadequate number of viral copies(<138 copies/mL). A negative result must be combined with clinical observations, patient history, and epidemiological information. The expected result is Negative.  Fact Sheet for Patients:  BloggerCourse.com  Fact Sheet for Healthcare Providers:  SeriousBroker.it  This test is no t yet approved or cleared by the [place] FDA and  has been authorized for detection and/or  diagnosis of SARS-CoV-2 by FDA under an Emergency Use Authorization (EUA). This EUA will remain  in effect (meaning this test can be used) for the duration of the COVID-19 declaration under Section 564(b)(1) of the Act, 21 U.S.C.section 360bbb-3(b)(1), unless the authorization is terminated  or revoked sooner.       Influenza A by PCR NEGATIVE NEGATIVE Final   Influenza B by PCR NEGATIVE NEGATIVE Final    Comment: (NOTE) The Xpert Xpress SARS-CoV-2/FLU/RSV plus assay is intended as an aid in the diagnosis of influenza from Nasopharyngeal swab specimens and should not be used as a sole basis for treatment. Nasal washings and aspirates are unacceptable for Xpert Xpress SARS-CoV-2/FLU/RSV testing.  Fact Sheet for Patients: BloggerCourse.com  Fact Sheet for Healthcare Providers: SeriousBroker.it  This test is not yet approved or cleared by the [place] FDA and has been authorized for detection and/or diagnosis of SARS-CoV-2 by FDA under an Emergency  Use Authorization (EUA). This EUA will remain in effect (meaning this test can be used) for the duration of the COVID-19 declaration under Section 564(b)(1) of the Act, 21 U.S.C. section 360bbb-3(b)(1), unless the authorization is terminated or revoked.  Performed at [facility], [address] [place], [place] [address]   MRSA Next Gen by PCR, Nasal     Status: None   Collection Time: 02/09/21  8:29 AM   Specimen: Nasal Mucosa; Nasal Swab  Result Value Ref Range Status   MRSA by PCR Next Gen NOT DETECTED NOT DETECTED Final    Comment: (NOTE) The GeneXpert MRSA Assay (FDA approved for NASAL specimens only), is one component of a comprehensive MRSA colonization surveillance program. It is not intended to diagnose MRSA infection nor to guide or monitor treatment for MRSA infections. Test performance is not FDA approved in patients less than 97  years old. Performed at [facility], [address] [place], [place] [address]   Aerobic/Anaerobic Culture w Gram Stain (surgical/deep wound)     Status: None   Collection Time: 02/11/21  1:56 PM   Specimen: Abscess  Result Value Ref Range Status   Specimen Description   Final    ABSCESS Performed at [facility], [address] [place], [place] [address]    Special Requests   Final    Normal Performed at [facility], [address], [place], [place] [address]    Gram Stain   Final    NO SQUAMOUS EPITHELIAL CELLS SEEN ABUNDANT WBC SEEN MODERATE GRAM NEGATIVE RODS MODERATE GRAM POSITIVE COCCI    Culture   Final    ABUNDANT ESCHERICHIA COLI NO ANAEROBES ISOLATED Performed at [facility], [address] [place], [place] [address]    Report Status 02/15/2021 FINAL  Final   Organism ID, Bacteria ESCHERICHIA COLI  Final      Susceptibility   Escherichia coli - MIC*    AMPICILLIN >=32 RESISTANT Resistant     CEFAZOLIN >=64 RESISTANT Resistant     CEFEPIME <=0.12 SENSITIVE Sensitive     CEFTAZIDIME 16 INTERMEDIATE Intermediate     CEFTRIAXONE 32 RESISTANT Resistant     CIPROFLOXACIN <=0.25 SENSITIVE Sensitive     GENTAMICIN <=1 SENSITIVE Sensitive     IMIPENEM <=0.25 SENSITIVE Sensitive     TRIMETH/SULFA <=20 SENSITIVE Sensitive     AMPICILLIN/SULBACTAM >=32 RESISTANT Resistant     PIP/TAZO <=4 SENSITIVE Sensitive     * ABUNDANT ESCHERICHIA COLI      Scheduled Meds:  enoxaparin (LOVENOX) injection  40 mg Subcutaneous Q24H   feeding supplement  1 Container Oral TID BM   insulin aspart  0-5 Units Subcutaneous QHS   insulin aspart  0-9 Units Subcutaneous TID WC   methocarbamol  500 mg Oral Q6H   metoprolol succinate  50 mg Oral Daily   mometasone-formoterol  2 puff Inhalation BID   pantoprazole  40 mg Oral Daily   polycarbophil  625 mg Oral BID   polyethylene glycol  17 g Oral Daily   potassium  chloride  20 mEq Oral BID   QUEtiapine  12.5 mg Oral QHS   saccharomyces boulardii  250 mg Oral BID   sodium chloride flush  3 mL Intravenous Q12H   sodium chloride flush  5 mL Intracatheter Q8H   Continuous Infusions:  piperacillin-tazobactam (ZOSYN)  IV 3.375 g (02/15/21 1553)     LOS: 8 days   Briant Cedar, MD [facility]   If 7PM-7AM, please contact night-coverage per Amion 02/15/2021, 7:11 PM

## 2021-02-15 NOTE — Progress Notes (Signed)
Supervising Physician: Gilmer Mor  Patient Status:  [facility] - In-pt  Chief Complaint:  Post operative fluid collection   Subjective:  9 days status post repair of incarcerated ventral hernia with development of postoperative fluid collection of the anterior peritoneal cavity immediately deep to the abdominal wall.    4 days s/p drain placement into fluid collection.  Patient reports minimal appetite and intermittent abdominal pain.     Allergies: Buchu-cornsilk-ch grass-hydran, Celebrex [celecoxib], Ciprofloxacin, Cymbalta [duloxetine hcl], Dilaudid [hydromorphone hcl], Fentanyl and related, Glucophage [metformin hcl], Oxycodone, Pregabalin, Hydrocodone-acetaminophen, Morphine and related, Sulfa antibiotics, and Vicodin [hydrocodone-acetaminophen]  Medications: Prior to Admission medications   Medication Sig Start Date End Date Taking? Authorizing Provider  albuterol (PROVENTIL HFA;VENTOLIN HFA) 108 (90 BASE) MCG/ACT inhaler Inhale 2 puffs into the lungs every 4 (four) hours as needed for wheezing or shortness of breath.   Yes [provider]  amLODipine (NORVASC) 10 MG tablet Take 10 mg by mouth daily.    Yes [provider]  aspirin EC 81 MG tablet Take 81 mg by mouth daily.   Yes [provider]  budesonide-formoterol (SYMBICORT) 80-4.5 MCG/ACT inhaler Inhale 2 puffs into the lungs 2 (two) times daily.   Yes [provider]  cholecalciferol (VITAMIN D) 1000 UNITS tablet Take 1,000 Units by mouth daily.   Yes [provider]  cyanocobalamin 1000 MCG tablet Take 1,000 mcg by mouth daily.    Yes [provider]  dicyclomine (BENTYL) 20 MG tablet Take 20 mg 2 (two) times daily by mouth. 08/30/16 02/07/21 Yes [provider]  FLUoxetine (PROZAC) 20 MG capsule Take 20 mg by mouth daily.    Yes [provider]  fluticasone (FLONASE) 50 MCG/ACT nasal spray Place 2 sprays into both nostrils daily as needed for  allergies or rhinitis.   Yes [provider]  glucagon 1 MG injection Inject 1 mg into the vein once as needed (severe insulin reaction).   Yes [provider]  hydrocortisone (ANUSOL-HC) 2.5 % rectal cream Place 1 application rectally 3 (three) times daily as needed for hemorrhoids.    Yes [provider]  insulin glargine (LANTUS) 100 UNIT/ML injection Inject 50 Units into the skin at bedtime.   Yes [provider]  isosorbide mononitrate (IMDUR) 60 MG 24 hr tablet Take 60 mg by mouth daily.   Yes [provider]  lidocaine (XYLOCAINE) 5 % ointment Apply 1 application topically 4 (four) times daily as needed for moderate pain.   Yes [provider]  LIRAGLUTIDE [place] Inject 1.8 mg into the skin daily.   Yes [provider]  lisinopril (ZESTRIL) 5 MG tablet Take 5 mg by mouth daily. 08/02/16  Yes [provider]  loperamide (IMODIUM) 2 MG capsule Take 4 mg by mouth daily as needed for diarrhea or loose stools.   Yes [provider]  magnesium oxide (MAG-OX) 400 MG tablet Take 400 mg by mouth 2 (two) times daily.   Yes [provider]  metFORMIN (GLUCOPHAGE-XR) 500 MG 24 hr tablet Take 500 mg by mouth daily with breakfast.   Yes [provider]  metoprolol succinate (TOPROL-XL) 100 MG 24 hr tablet Take 100 mg by mouth daily. Take with or immediately following a meal.   Yes [provider]  Multiple Vitamin (MULTIVITAMIN WITH MINERALS) TABS tablet Take 1 tablet by mouth daily.   Yes [provider]  nitroGLYCERIN (NITROSTAT) 0.4 MG SL tablet Place 0.4 mg under the tongue every  5 (five) minutes as needed for chest pain.   Yes [provider]  Nitroglycerin 0.4 % OINT Place 1 application rectally 2 (two) times daily as needed (for chest pain.).    Yes [provider]  pantoprazole (PROTONIX) 20 MG tablet Take 20 mg by mouth daily.   Yes [provider]  promethazine  (PHENERGAN) 25 MG tablet Take 25 mg by mouth every 6 (six) hours as needed for nausea or vomiting.   Yes [provider]  ranolazine (RANEXA) 500 MG 12 hr tablet Take 500 mg by mouth 2 (two) times daily.   Yes [provider]  rosuvastatin (CRESTOR) 10 MG tablet Take 10 mg by mouth daily.   Yes [provider]  temazepam (RESTORIL) 7.5 MG capsule Take 7.5 mg by mouth at bedtime as needed for sleep.   Yes [provider]  traMADol (ULTRAM) 50 MG tablet Take 1 tablet (50 mg total) every 6 (six) hours as needed by mouth. 02/15/17  Yes Bethel Born, PA-C  traZODone (DESYREL) 50 MG tablet Take 50 mg by mouth at bedtime.   Yes [provider]  triamcinolone cream (KENALOG) 0.5 % Apply 1 application topically 2 (two) times daily as needed (for skin irritation).    Yes [provider]  gabapentin (NEURONTIN) 300 MG capsule Take 600 mg by mouth 2 (two) times daily.    [provider]     Vital Signs: BP 117/71 (BP Location: Left Arm)   Pulse 74   Temp 97.7 F (36.5 C) (Oral)   Resp 20   Ht 5\' 4"  (1.626 m)   Wt 200 lb (90.7 kg)   SpO2 96%   BMI 34.33 kg/m   Physical Exam: General: Alert, A/Ox3, in NAD HENT: WNL CV: Normal pulses Pulm: Normal respiratory effort and rate Abd: RUQ drain w 25cc purulent output  Drain Location: RUQ Size: Fr size: 10 Fr Date of placement: 02/11/21  Currently to: Drain collection device: suction bulb 24 hour output:  Output by Drain (mL) 02/13/21 0701 - 02/13/21 1900 02/13/21 1901 - 02/14/21 0700 02/14/21 0701 - 02/14/21 1900 02/14/21 1901 - 02/15/21 0700 02/15/21 0701 - 02/15/21 1035  Closed System Drain 1 Right RUQ Bulb (JP) 10 Fr. 50 50 50 45   Negative Pressure Wound Therapy Abdomen Mid  30  10     Current examination: Flushes/aspirates easily.  Insertion site unremarkable. Suture and stat lock in place. Dressed appropriately.       Imaging: CT IMAGE GUIDED DRAINAGE BY  PERCUTANEOUS CATHETER  Result Date: 02/11/2021 CLINICAL DATA:  Status post repair of incarcerated ventral hernia with development of postoperative fluid collection of the anterior peritoneal cavity immediately deep to the abdominal wall. EXAM: CT GUIDED CATHETER DRAINAGE OF PERITONEAL ABSCESS ANESTHESIA/SEDATION: Moderate (conscious) sedation was employed during this procedure. A total of Versed 1.5 mg and Fentanyl 75 mcg was administered intravenously by radiology nursing under my supervision. Moderate Sedation Time: 42 minutes. The patient's level of consciousness and vital signs were monitored continuously by radiology nursing throughout the procedure under my direct supervision. PROCEDURE: The procedure, risks, benefits, and alternatives were explained to the patient. Questions regarding the procedure were encouraged and answered. The patient understands and consents to the procedure. A time out was performed prior to initiating the procedure. The abdominal wall was prepped with chlorhexidine in a sterile fashion, and a sterile drape was applied covering the operative field. A sterile gown and sterile gloves were used for the procedure. Local  anesthesia was provided with 1% Lidocaine. Under CT guidance, an 18 gauge trocar needle was advanced into an anterior peritoneal fluid collection from a right-sided anterior approach. After confirming needle tip position and aspiration of fluid via the needle, a guidewire was advanced and the needle removed. The tract was dilated and a 10 French percutaneous drainage catheter placed. The catheter was formed and attached to a suction bulb. Additional CT was performed. A fluid sample was sent for culture analysis. The catheter was secured at the skin with a Prolene retention suture and adhesive StatLock device. COMPLICATIONS: None FINDINGS: Aspiration at the level of the anterior peritoneal fluid collection just to the right of midline yielded purulent fluid. There was good  return of fluid after drain placement. IMPRESSION: CT-guided percutaneous catheter drainage of anterior peritoneal abscess yielding purulent fluid. A fluid sample was sent for culture analysis. A 10 French drain was placed and attached to suction bulb drainage. Electronically Signed   By: Irish Lack M.D.   On: 02/11/2021 14:18    Labs:  CBC: Recent Labs    02/12/21 0532 02/13/21 0442 02/14/21 0504 02/15/21 0508  WBC 18.0* 14.0* 13.0* 13.6*  HGB 11.2* 10.5* 11.0* 11.1*  HCT 37.1 32.0* 34.1* 34.8*  PLT 343 344 375 397    COAGS: No results for input(s): INR, APTT in the last 8760 hours.  BMP: Recent Labs    02/12/21 0532 02/13/21 0442 02/14/21 0504 02/15/21 0508  NA 145 141 141 139  K 4.0 3.4* 3.3* 3.7  CL 114* 111 110 111  CO2 21* 20* 23 21*  GLUCOSE 153* 156* 161* 160*  BUN 10 10 11 18   CALCIUM 9.2 8.9 8.9 9.0  CREATININE 0.70 0.85 0.98 1.40*  GFRNONAA >60 >60 >60 41*    LIVER FUNCTION TESTS: Recent Labs    02/06/21 1315 02/07/21 0546 02/11/21 0513 02/12/21 0532  BILITOT 0.4 0.7 0.6 0.7  AST 22 25 24 26   ALT 18 18 18 23   ALKPHOS 58 55 85 90  PROT 8.0 6.7 7.1 6.9  ALBUMIN 4.1 3.3* 2.7* 2.7*    Assessment and Plan:  Abdominal fluid collection:  Continue TID flushes with 5 cc NS. Record output Q shift. Dressing changes QD or PRN if soiled.  Call IR APP or on call IR MD if difficulty flushing or sudden change in drain output.  Repeat imaging/possible drain injection once output < 10 mL/QD (excluding flush material.)  Discharge planning: Please contact IR APP or on call IR MD prior to patient d/c to ensure appropriate follow up plans are in place. Typically patient will follow up with IR clinic 10-14 days post d/c for repeat imaging/possible drain injection. IR scheduler will contact patient with date/time of appointment. Patient will need to flush drain QD with 5 cc NS, record output QD, dressing changes every 2-3 days or earlier if soiled.   IR will  continue to follow - please call with questions or concerns.  Electronically Signed: 13/06/22, PA 02/15/2021, 10:29 AM   I spent a total of 15 Minutes at the the patient's bedside AND on the patient's hospital floor or unit, greater than 50% of which was counseling/coordinating care for RUQ drain follow up.

## 2021-02-16 ENCOUNTER — Inpatient Hospital Stay (HOSPITAL_COMMUNITY): Payer: Medicare PPO

## 2021-02-16 LAB — CBC WITH DIFFERENTIAL/PLATELET
Abs Immature Granulocytes: 0.18 10*3/uL — ABNORMAL HIGH (ref 0.00–0.07)
Basophils Absolute: 0 10*3/uL (ref 0.0–0.1)
Basophils Relative: 0 %
Eosinophils Absolute: 0.3 10*3/uL (ref 0.0–0.5)
Eosinophils Relative: 3 %
HCT: 32.2 % — ABNORMAL LOW (ref 36.0–46.0)
Hemoglobin: 10.2 g/dL — ABNORMAL LOW (ref 12.0–15.0)
Immature Granulocytes: 2 %
Lymphocytes Relative: 18 %
Lymphs Abs: 1.7 10*3/uL (ref 0.7–4.0)
MCH: 32.1 pg (ref 26.0–34.0)
MCHC: 31.7 g/dL (ref 30.0–36.0)
MCV: 101.3 fL — ABNORMAL HIGH (ref 80.0–100.0)
Monocytes Absolute: 1.2 10*3/uL — ABNORMAL HIGH (ref 0.1–1.0)
Monocytes Relative: 12 %
Neutro Abs: 6.1 10*3/uL (ref 1.7–7.7)
Neutrophils Relative %: 65 %
Platelets: 362 10*3/uL (ref 150–400)
RBC: 3.18 MIL/uL — ABNORMAL LOW (ref 3.87–5.11)
RDW: 14.1 % (ref 11.5–15.5)
WBC: 9.4 10*3/uL (ref 4.0–10.5)
nRBC: 0 % (ref 0.0–0.2)

## 2021-02-16 LAB — GLUCOSE, CAPILLARY
Glucose-Capillary: 138 mg/dL — ABNORMAL HIGH (ref 70–99)
Glucose-Capillary: 136 mg/dL — ABNORMAL HIGH (ref 70–99)
Glucose-Capillary: 173 mg/dL — ABNORMAL HIGH (ref 70–99)
Glucose-Capillary: 129 mg/dL — ABNORMAL HIGH (ref 70–99)

## 2021-02-16 LAB — BASIC METABOLIC PANEL
Anion gap: 8 (ref 5–15)
BUN: 17 mg/dL (ref 8–23)
CO2: 20 mmol/L — ABNORMAL LOW (ref 22–32)
Calcium: 8.9 mg/dL (ref 8.9–10.3)
Chloride: 112 mmol/L — ABNORMAL HIGH (ref 98–111)
Creatinine, Ser: 1.05 mg/dL — ABNORMAL HIGH (ref 0.44–1.00)
GFR, Estimated: 59 mL/min — ABNORMAL LOW (ref >60)
Glucose, Bld: 141 mg/dL — ABNORMAL HIGH (ref 70–99)
Potassium: 4 mmol/L (ref 3.5–5.1)
Sodium: 140 mmol/L (ref 135–145)

## 2021-02-16 MED ORDER — DIATRIZOATE MEGLUMINE & SODIUM 66-10 % PO SOLN
15.0000 mL | ORAL | Status: AC
  Administered 2021-02-16: 15 mL via ORAL
  Filled 2021-02-16: qty 30

## 2021-02-16 MED ORDER — DIATRIZOATE MEGLUMINE & SODIUM 66-10 % PO SOLN
ORAL | Status: AC
  Administered 2021-02-16: 15 mL via ORAL
  Filled 2021-02-16: qty 30

## 2021-02-16 MED ORDER — IOHEXOL 350 MG/ML SOLN
80.0000 mL | Freq: Once | INTRAVENOUS | Status: AC | PRN
  Administered 2021-02-16: 80 mL via INTRAVENOUS

## 2021-02-16 NOTE — Progress Notes (Signed)
Laurie [name]  MGN:[id number] DOB: 1955/12/19 DOA: 02/06/2021 PCP: Margit Hanks, MD    Brief Narrative:  89QX w/ a hx of DM2, HTN, HLD, anxiety/depression, GERD, obesity, and multiple abdomino-pelvic surgeries who presented to the ED with 5 days of worsening lower abdominal pain associated with loose stools and vomiting. In the ED CT revealed an incarcerated ventral hernia. EDP attempted reduction though symptoms did not improve. Labs notable for acute renal failure in the setting of dehydration. She was transferred to [facility] ED for surgical consult, and repeat CT abdomen showed persistent hernia with bowel obstruction for which she was taken to the OR.    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 Vonita Moss at [facility] - history of cystectomy and right colon pouch for interstitial cystitis 1990s - pouch stone is found to be nonobstructing  DM2 SSI, accuchecks  HTN BP stable  COPD Stable Continue usual Dulera and as needed albuterol  GERD PPI  Obesity - Body mass index is 34.33 kg/m. Lifestyle modification advised    Significant events: 10/31 ventral hernia repair with small bowel resection and LOA 11/5 CT-guided drainage of anterior peritoneal abscess in IR -10 French drain left in place none  Consultants:  General Surgery IR  Code Status: FULL  CODE  Antimicrobials:  Zosyn 11/6 >  DVT prophylaxis: Lovenox  Family Communication: None at bedside  Status is: Inpatient -ultimate disposition will be discharge home -she has many family members and an extensive support network   Objective: Blood pressure 138/72, pulse 72, temperature 97.9 F (36.6 C), temperature source Oral, resp. rate 19, height 5\' 4"  (1.626 m), weight 90.7 kg, SpO2 99 %.  Intake/Output Summary (Last 24 hours) at 02/16/2021 1600 Last data filed at 02/16/2021 13/01/2021 Gross per 24 hour  Intake 318 ml  Output 700 ml  Net -382 ml   Filed Weights   02/06/21 1053  Weight: 90.7 kg    Examination: General: NAD  Cardiovascular: S1, S2 present Respiratory: CTAB Abdomen: Soft, mildly tender, nondistended, bowel sounds present, incision clean/dry/intact Musculoskeletal: No bilateral pedal edema noted Skin: As above Psychiatry: Normal mood    CBC: Recent Labs  Lab 02/14/21 0504 02/15/21 0508 02/16/21 0445  WBC 13.0* 13.6* 9.4  NEUTROABS  --   --  6.1  HGB 11.0* 11.1* 10.2*  HCT 34.1* 34.8* 32.2*  MCV 98.8 100.9* 101.3*  PLT 375 397 362   Basic Metabolic Panel: Recent Labs  Lab 02/11/21 0513 02/12/21 0532 02/13/21 0442 02/14/21 0504 02/15/21 0508 02/16/21 0445  NA 145 145 141 141 139 140  K 4.2 4.0 3.4* 3.3* 3.7 4.0  CL 116* 114* 111 110 111 112*  CO2 21* 21* 20* 23 21* 20*  GLUCOSE 167* 153* 156* 161* 160* 141*  BUN 17 10 10 11 18 17   CREATININE 0.99 0.70 0.85 0.98 1.40* 1.05*  CALCIUM 9.0 9.2 8.9 8.9 9.0 8.9  MG 1.9 1.6* 1.8  --  1.8  --  PHOS 3.0 3.4  --   --   --   --    GFR: Estimated Creatinine Clearance: 57.5 mL/min (A) (by C-G formula based on SCr of 1.05 mg/dL (H)).  Liver Function Tests: Recent Labs  Lab 02/11/21 0513 02/12/21 0532  AST 24 26  ALT 18 23  ALKPHOS 85 90  BILITOT 0.6 0.7  PROT 7.1 6.9  ALBUMIN 2.7* 2.7*  No results for input(s): LIPASE, AMYLASE in the last 168 hours.   HbA1C: Hgb A1c MFr Bld   Date/Time Value Ref Range Status  02/07/2021 05:46 AM 6.2 (H) 4.8 - 5.6 % Final    Comment:    (NOTE) Pre diabetes:          5.7%-6.4%  Diabetes:              >6.4%  Glycemic control for   <7.0% adults with diabetes   06/25/2015 05:06 AM 6.2 (H) 4.8 - 5.6 % Final    Comment:    (NOTE)         Pre-diabetes: 5.7 - 6.4         Diabetes: >6.4         Glycemic control for adults with diabetes: <7.0     CBG: Recent Labs  Lab 02/15/21 1222 02/15/21 1631 02/15/21 2237 02/16/21 0806 02/16/21 1158  GLUCAP 149* 175* 154* 138* 136*    Recent Results (from the past 240 hour(s))  MRSA Next Gen by PCR, Nasal     Status: None   Collection Time: 02/09/21  8:29 AM   Specimen: Nasal Mucosa; Nasal Swab  Result Value Ref Range Status   MRSA by PCR Next Gen NOT DETECTED NOT DETECTED Final    Comment: (NOTE) The GeneXpert MRSA Assay (FDA approved for NASAL specimens only), is one component of a comprehensive MRSA colonization surveillance program. It is not intended to diagnose MRSA infection nor to guide or monitor treatment for MRSA infections. Test performance is not FDA approved in patients less than 32 years old. Performed at [facility], [address], [place], [place] [address]   Aerobic/Anaerobic Culture w Gram Stain (surgical/deep wound)     Status: None   Collection Time: 02/11/21  1:56 PM   Specimen: Abscess  Result Value Ref Range Status   Specimen Description   Final    ABSCESS Performed at [facility], [address], [place], [place] [address]    Special Requests   Final    Normal Performed at [facility], [address] [place], [place] [address]    Gram Stain   Final    NO SQUAMOUS EPITHELIAL CELLS SEEN ABUNDANT WBC SEEN MODERATE GRAM NEGATIVE RODS MODERATE GRAM POSITIVE COCCI    Culture   Final    ABUNDANT ESCHERICHIA COLI NO ANAEROBES ISOLATED Performed at [facility], [address] [place], [place] [address]    Report Status 02/15/2021 FINAL  Final   Organism ID, Bacteria ESCHERICHIA COLI  Final      Susceptibility   Escherichia coli - MIC*    AMPICILLIN >=32 RESISTANT Resistant     CEFAZOLIN >=64 RESISTANT Resistant     CEFEPIME <=0.12 SENSITIVE Sensitive     CEFTAZIDIME 16 INTERMEDIATE Intermediate     CEFTRIAXONE 32 RESISTANT Resistant     CIPROFLOXACIN <=0.25 SENSITIVE Sensitive     GENTAMICIN <=1 SENSITIVE Sensitive     IMIPENEM <=0.25 SENSITIVE Sensitive     TRIMETH/SULFA <=20 SENSITIVE Sensitive  AMPICILLIN/SULBACTAM >=32 RESISTANT Resistant     PIP/TAZO <=4 SENSITIVE Sensitive     * ABUNDANT ESCHERICHIA COLI      Scheduled Meds:  enoxaparin (LOVENOX) injection  40 mg Subcutaneous Q24H   feeding supplement  1 Container Oral TID BM   insulin aspart  0-5 Units Subcutaneous QHS   insulin aspart  0-9 Units Subcutaneous TID WC   melatonin  3 mg Oral QHS   methocarbamol  500 mg Oral Q6H   metoprolol succinate  50 mg Oral Daily   mometasone-formoterol  2 puff Inhalation BID   pantoprazole  40 mg Oral Daily   polycarbophil  625 mg Oral BID   polyethylene glycol  17 g Oral Daily   QUEtiapine  12.5 mg Oral QHS   saccharomyces boulardii  250 mg Oral BID   sodium chloride flush  3 mL Intravenous Q12H   sodium chloride flush  5 mL Intracatheter Q8H   Continuous Infusions:  piperacillin-tazobactam (ZOSYN)  IV 3.375 g (02/16/21 1336)     LOS: 9 days   Briant Cedar, MD [facility]   If 7PM-7AM, please contact night-coverage per Amion 02/16/2021, 4:00 PM

## 2021-02-16 NOTE — Progress Notes (Signed)
Referring Physician(s): Gross,S  Supervising Physician: Marliss Coots  Patient Status:  [facility] - In-pt  Chief Complaint:  Abdominal pain, anterior abdominal abscess  Subjective: Pt c/o abd discomfort; denies N/V   Allergies: Buchu-cornsilk-ch grass-hydran, Celebrex [celecoxib], Ciprofloxacin, Cymbalta [duloxetine hcl], Dilaudid [hydromorphone hcl], Fentanyl and related, Glucophage [metformin hcl], Oxycodone, Pregabalin, Hydrocodone-acetaminophen, Morphine and related, Sulfa antibiotics, and Vicodin [hydrocodone-acetaminophen]  Medications: Prior to Admission medications   Medication Sig Start Date End Date Taking? Authorizing Provider  albuterol (PROVENTIL HFA;VENTOLIN HFA) 108 (90 BASE) MCG/ACT inhaler Inhale 2 puffs into the lungs every 4 (four) hours as needed for wheezing or shortness of breath.   Yes [provider]  amLODipine (NORVASC) 10 MG tablet Take 10 mg by mouth daily.    Yes [provider]  aspirin EC 81 MG tablet Take 81 mg by mouth daily.   Yes [provider]  budesonide-formoterol (SYMBICORT) 80-4.5 MCG/ACT inhaler Inhale 2 puffs into the lungs 2 (two) times daily.   Yes [provider]  cholecalciferol (VITAMIN D) 1000 UNITS tablet Take 1,000 Units by mouth daily.   Yes [provider]  cyanocobalamin 1000 MCG tablet Take 1,000 mcg by mouth daily.    Yes [provider]  dicyclomine (BENTYL) 20 MG tablet Take 20 mg 2 (two) times daily by mouth. 08/30/16 02/07/21 Yes [provider]  FLUoxetine (PROZAC) 20 MG capsule Take 20 mg by mouth daily.    Yes [provider]  fluticasone (FLONASE) 50 MCG/ACT nasal spray Place 2 sprays into both nostrils daily as needed for allergies or rhinitis.   Yes [provider]  glucagon 1 MG injection Inject 1 mg into the vein once as needed (severe insulin reaction).   Yes [provider]  hydrocortisone (ANUSOL-HC) 2.5 % rectal cream Place 1  application rectally 3 (three) times daily as needed for hemorrhoids.    Yes [provider]  insulin glargine (LANTUS) 100 UNIT/ML injection Inject 50 Units into the skin at bedtime.   Yes [provider]  isosorbide mononitrate (IMDUR) 60 MG 24 hr tablet Take 60 mg by mouth daily.   Yes [provider]  lidocaine (XYLOCAINE) 5 % ointment Apply 1 application topically 4 (four) times daily as needed for moderate pain.   Yes [provider]  LIRAGLUTIDE Jerome Inject 1.8 mg into the skin daily.   Yes [provider]  lisinopril (ZESTRIL) 5 MG tablet Take 5 mg by mouth daily. 08/02/16  Yes [provider]  loperamide (IMODIUM) 2 MG capsule Take 4 mg by mouth daily as needed for diarrhea or loose stools.   Yes [provider]  magnesium oxide (MAG-OX) 400 MG tablet Take 400 mg by mouth 2 (two) times daily.   Yes [provider]  metFORMIN (GLUCOPHAGE-XR) 500 MG 24 hr tablet Take 500 mg by mouth daily with breakfast.   Yes [provider]  metoprolol succinate (TOPROL-XL) 100 MG 24 hr tablet Take 100 mg by mouth daily. Take with or immediately following a meal.   Yes [provider]  Multiple Vitamin (MULTIVITAMIN WITH MINERALS) TABS tablet Take 1 tablet by mouth daily.   Yes [provider]  nitroGLYCERIN (NITROSTAT) 0.4 MG SL tablet Place 0.4 mg under the tongue every 5 (five) minutes as needed for chest pain.   Yes [provider]  Nitroglycerin 0.4 % OINT Place 1 application rectally 2 (two) times daily as needed (for chest pain.).    Yes [provider]  pantoprazole (PROTONIX) 20 MG tablet Take 20 mg by mouth daily.   Yes [provider]  promethazine (PHENERGAN) 25 MG tablet Take 25 mg by mouth every 6 (six) hours as needed for nausea or vomiting.   Yes [provider]  ranolazine (RANEXA) 500 MG 12 hr tablet Take 500 mg by mouth 2 (two) times daily.   Yes [provider]  rosuvastatin (CRESTOR) 10 MG tablet Take 10 mg by mouth daily.   Yes [provider]  temazepam (RESTORIL) 7.5 MG capsule Take 7.5 mg by mouth at bedtime as needed for sleep.   Yes [provider]  traMADol (ULTRAM) 50 MG tablet Take 1 tablet (50 mg total) every 6 (six) hours as needed by mouth. 02/15/17  Yes Bethel Born, PA-C  traZODone (DESYREL) 50 MG tablet Take 50 mg by mouth at bedtime.   Yes [provider]  triamcinolone cream (KENALOG) 0.5 % Apply 1 application topically 2 (two) times daily as needed (for skin irritation).    Yes [provider]  gabapentin (NEURONTIN) 300 MG capsule Take 600 mg by mouth 2 (two) times daily.    [provider]     Vital Signs: BP 131/68 (BP Location: Left Arm)   Pulse 64   Temp 97.6 F (36.4 C) (Oral)   Resp 18   Ht 5\' 4"  (1.626 m)   Wt 200 lb (90.7 kg)   SpO2 98%   BMI 34.33 kg/m   Physical Exam awake/alert; rt abd drain intact, dressing clean and dry, mildly tender, OP 50 cc turbid yellow fluid, drain flushed without difficulty  Imaging: CT ABDOMEN PELVIS W CONTRAST  Result Date: 02/16/2021 CLINICAL DATA:  Abdominal abscess, infection suspected EXAM: CT ABDOMEN AND PELVIS WITH CONTRAST TECHNIQUE: Multidetector CT imaging of the abdomen and pelvis was performed using the standard protocol following bolus administration of intravenous contrast. CONTRAST:  39mL OMNIPAQUE IOHEXOL 350 MG/ML SOLN COMPARISON:  CT abdomen and pelvis 02/10/2021 FINDINGS: Lower chest: Elevated right hemidiaphragm and subsegmental atelectatic changes in the right lung base. Hepatobiliary: No focal liver abnormality is seen. Status post cholecystectomy. No biliary dilatation. Pancreas: Unremarkable. No pancreatic ductal dilatation or surrounding inflammatory changes. Spleen: Normal in size without focal abnormality. Adrenals/Urinary Tract: Bilateral adrenal thickening and an 11 mm nodular density in the left  adrenal gland, unchanged. Kidneys appear stable and within normal limits. Urinary bladder is surgically absent. 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 Associated right abdominal urinary ostomy. Stomach/Bowel: No bowel obstruction, free air or pneumatosis identified. Colonic diverticulosis without evidence of acute diverticulitis. Mild wall thickening of loops of small bowel in the anterior mid abdomen adjacent to previous surgical changes. Vascular/Lymphatic: Aortic atherosclerosis. No enlarged abdominal or pelvic lymph nodes. Reproductive: Status post hysterectomy. No adnexal masses. Other: Redemonstration of surgical changes from recent umbilical hernia repair. Interval placement of a percutaneous drainage catheter in the anterior abdomen with essentially complete resolution of the previous fluid collection. A few small foci of residual air identified in the region. Similar appearing mild fat stranding about the umbilicus no new fluid collection identified. No frank ascites. Musculoskeletal: No suspicious bony lesions identified. IMPRESSION: 1. Recent umbilical hernia surgical repair changes again seen. Interval resolution of the previous fluid collection in the anterior abdomen status post percutaneous drainage catheter placement. A few small foci of residual air in the region. Stable appearing soft tissue fat  stranding about the umbilicus with no new collection identified. 2. Previous cystectomy and urinary diversion changes, unchanged. 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 3. Colonic diverticulosis. 4. Other findings as described. Electronically Signed   By: Jannifer Hick M.D.   On: 02/16/2021 13:57    Labs:  CBC: Recent Labs    02/13/21 0442 02/14/21 0504 02/15/21 0508 02/16/21 0445  WBC 14.0* 13.0* 13.6* 9.4  HGB 10.5* 11.0* 11.1* 10.2*  HCT 32.0* 34.1* 34.8* 32.2*  PLT 344 375  397 362    COAGS: No results for input(s): INR, APTT in the last 8760 hours.  BMP: Recent Labs    02/13/21 0442 02/14/21 0504 02/15/21 0508 02/16/21 0445  NA 141 141 139 140  K 3.4* 3.3* 3.7 4.0  CL 111 110 111 112*  CO2 20* 23 21* 20*  GLUCOSE 156* 161* 160* 141*  BUN 10 11 18 17   CALCIUM 8.9 8.9 9.0 8.9  CREATININE 0.85 0.98 1.40* 1.05*  GFRNONAA >60 >60 41* 59*    LIVER FUNCTION TESTS: Recent Labs    02/06/21 1315 02/07/21 0546 02/11/21 0513 02/12/21 0532  BILITOT 0.4 0.7 0.6 0.7  AST 22 25 24 26   ALT 18 18 18 23   ALKPHOS 58 55 85 90  PROT 8.0 6.7 7.1 6.9  ALBUMIN 4.1 3.3* 2.7* 2.7*    Assessment and Plan: Ventral hernia with SBO POD#9 S/P ventral hernia repair with small bowel resection, LOA 10/31 Dr.  : s/p ant perit drain placement 11/5; WBC nl; creat 1.05(1.4), drain fluid cx- e coli ; f/u CT today revealed:  1. Recent umbilical hernia surgical repair changes again seen. Interval resolution of the previous fluid collection in the anterior abdomen status post percutaneous drainage catheter placement. A few small foci of residual air in the region. Stable appearing soft tissue fat stranding about the umbilicus with no new collection identified. 2. Previous cystectomy and urinary diversion changes, unchanged. 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 3. Colonic diverticulosis.  Cont current tx, close OP monitoring -too high at this point for removal and may require injection beforehand; will arrange f/u in IR drain clinic after d/c  Electronically Signed: D. 11/31, PA-C 02/16/2021, 2:40 PM   I spent a total of 15 Minutes at the the patient's bedside AND on the patient's hospital floor or unit, greater than 50% of which was counseling/coordinating care for anterior abdominal abscess drain    Patient ID: 13/5, female   DOB: [birth year], 65 y.o.   MRN: [name]

## 2021-02-16 NOTE — Plan of Care (Signed)

## 2021-02-16 NOTE — Progress Notes (Signed)
Patient ID: Laurie [name], female   DOB: 1955/06/16, 65 y.o.   MRN: [id number] [facility] Progress Note  10 Days Post-Op  Subjective: CC-  Feeling slightly better. States that abdominal pain is a little less. Still has some nausea but ate a little better yesterday, no emesis. Passing flatus and had a BM.   Objective: Vital signs in last 24 hours: Temp:  [97.6 F (36.4 C)-98.6 F (37 C)] 97.6 F (36.4 C) (11/10 0426) Pulse Rate:  [59-76] 64 (11/10 0426) Resp:  [18] 18 (11/10 0426) BP: (127-140)/(65-84) 131/68 (11/10 0426) SpO2:  [97 %-100 %] 98 % (11/10 0752) Last BM Date: 02/14/21  Intake/Output from previous day: 11/09 0701 - 11/10 0700 In: 1498 [P.O.:1298; I.V.:100] Out: 2275 [Urine:2225; Drains:50] Intake/Output this shift: No intake/output data recorded.  PE: Gen:  Alert, NAD, pleasant Pulm: rate and effort normal Abd: vac to midline with good seal, soft, nondistended, tender over midline and left hemiabdomen, drain output purulent  Lab Results:  Recent Labs    02/15/21 0508 02/16/21 0445  WBC 13.6* 9.4  HGB 11.1* 10.2*  HCT 34.8* 32.2*  PLT 397 362   BMET Recent Labs    02/15/21 0508 02/16/21 0445  NA 139 140  K 3.7 4.0  CL 111 112*  CO2 21* 20*  GLUCOSE 160* 141*  BUN 18 17  CREATININE 1.40* 1.05*  CALCIUM 9.0 8.9   PT/INR No results for input(s): LABPROT, INR in the last 72 hours. CMP     Component Value Date/Time   NA 140 02/16/2021 0445   K 4.0 02/16/2021 0445   CL 112 (H) 02/16/2021 0445   CO2 20 (L) 02/16/2021 0445   GLUCOSE 141 (H) 02/16/2021 0445   BUN 17 02/16/2021 0445   CREATININE 1.05 (H) 02/16/2021 0445   CALCIUM 8.9 02/16/2021 0445   PROT 6.9 02/12/2021 0532   ALBUMIN 2.7 (L) 02/12/2021 0532   AST 26 02/12/2021 0532   ALT 23 02/12/2021 0532   ALKPHOS 90 02/12/2021 0532   BILITOT 0.7 02/12/2021 0532   GFRNONAA 59 (L) 02/16/2021 0445   GFRAA 33 (L) 07/01/2015 0352   Lipase     Component Value Date/Time    LIPASE 34 02/06/2021 1315       Studies/Results: No results found.  Anti-infectives: Anti-infectives (From admission, onward)    Start     Dose/Rate Route Frequency Ordered Stop   02/12/21 1200  piperacillin-tazobactam (ZOSYN) IVPB 3.375 g        3.375 g 12.5 mL/hr over 240 Minutes Intravenous Every 8 hours 02/12/21 0950     02/06/21 2003  ceFAZolin (ANCEF) 2-4 GM/100ML-% IVPB       Note to Pharmacy: Ponciano Ort   : cabinet override      02/06/21 2003 02/06/21 2046   02/06/21 1945  ceFAZolin (ANCEF) IVPB 2g/100 mL premix        2 g 200 mL/hr over 30 Minutes Intravenous On call to O.R. 02/06/21 1914 02/06/21 2028        Assessment/Plan Ventral hernia with SBO POD#10 S/P ventral hernia repair with small bowel resection, LOA 10/31 Dr. 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

## 2021-02-16 NOTE — Progress Notes (Signed)
The patient is injury-free, afebrile, alert, and oriented X 3. Vital signs were within the baseline during this shift. She complained of abdominal pain, but it is improving. JP drain and wound vac are intact with no leakage. She denies chest pain, SOB, nausea, vomiting, dizziness, signs or symptoms of bleeding or acute changes during this shift. We will continue to monitor and work toward achieving the care plan goals.

## 2021-02-16 NOTE — Progress Notes (Signed)
Physical Therapy Treatment Patient Details Name: Laurie [name] MRN: [id number] DOB: Nov 25, 1955 Today's Date: 02/16/2021   History of Present Illness Laurie [name] is a 64 y.o. female with a history of T2DM, HTN, HLD, anxiety/depression, GERD, obesity and multiple abdominopelvic surgeries who presented to the ED 02/06/21 with abdominal pain , CT revealed an incarcerated ventral hernia. S/P small bowell resection, hernia repair and lysis of adhesions 02/06/21. patient underwent drain placement for anterior peirtoneal abcess on 11/5.    PT Comments    Pt cooperative this pm but activity tolerance limited by c/o increased abdominal pain.  Pt agreeable to ambulate limited distance in hall and then assisted back to bed.  Recommendations for follow up therapy are one component of a multi-disciplinary discharge planning process, led by the attending physician.  Recommendations may be updated based on patient status, additional functional criteria and insurance authorization.  Follow Up Recommendations  Home health PT     Assistance Recommended at Discharge Intermittent Supervision/Assistance  Equipment Recommendations  None recommended by PT    Recommendations for Other Services       Precautions / Restrictions Precautions Precautions: Fall Precaution Comments: abdomen, wound vac and JP drain on R Restrictions Weight Bearing Restrictions: No     Mobility  Bed Mobility Overal bed mobility: Needs Assistance Bed Mobility: Rolling;Sit to Sidelying Rolling: Supervision       Sit to sidelying: Min assist General bed mobility comments: cues for technique; assist to bring LEs onto bed    Transfers Overall transfer level: Needs assistance Equipment used: Rolling walker (2 wheels) Transfers: Sit to/from Stand Sit to Stand: Min guard           General transfer comment: elevated bed; cues for use of UEs to self assist    Ambulation/Gait Ambulation/Gait assistance:  Min guard Gait Distance (Feet): 54 Feet Assistive device: Rolling walker (2 wheels) Gait Pattern/deviations: Decreased stride length;Narrow base of support;Step-to pattern;Step-through pattern;Shuffle;Trunk flexed Gait velocity: decreased     General Gait Details: slow but steady, no loss of balance, trunk flexed slightly due to pain and pt stopping 2x for rest break.   Stairs             Wheelchair Mobility    Modified Rankin (Stroke Patients Only)       Balance Overall balance assessment: Needs assistance Sitting-balance support: Feet supported Sitting balance-Leahy Scale: Good     Standing balance support: No upper extremity supported Standing balance-Leahy Scale: Fair Standing balance comment: using both hands to put mask on in standing                            Cognition Arousal/Alertness: Awake/alert Behavior During Therapy: WFL for tasks assessed/performed Overall Cognitive Status: Within Functional Limits for tasks assessed                                          Exercises      General Comments        Pertinent Vitals/Pain Pain Assessment: 0-10 Pain Score: 8  Pain Location: abdomen Pain Descriptors / Indicators: Grimacing;Guarding Pain Intervention(s): Limited activity within patient's tolerance;Monitored during session;Patient requesting pain meds-RN notified    Home Living                          Prior Function  PT Goals (current goals can now be found in the care plan section) Acute Rehab PT Goals Patient Stated Goal: Less pain PT Goal Formulation: With patient/family Time For Goal Achievement: 02/21/21 Potential to Achieve Goals: Good Progress towards PT goals: Not progressing toward goals - comment (limited by abdominal pain)    Frequency    Min 3X/week      PT Plan Current plan remains appropriate    Co-evaluation              AM-PAC PT "6 Clicks" Mobility    Outcome Measure  Help needed turning from your back to your side while in a flat bed without using bedrails?: A Little Help needed moving from lying on your back to sitting on the side of a flat bed without using bedrails?: A Little Help needed moving to and from a bed to a chair (including a wheelchair)?: A Little Help needed standing up from a chair using your arms (e.g., wheelchair or bedside chair)?: A Little Help needed to walk in hospital room?: A Little Help needed climbing 3-5 steps with a railing? : A Lot 6 Click Score: 17    End of Session Equipment Utilized During Treatment: Gait belt Activity Tolerance: Patient tolerated treatment well Patient left: in bed;with call bell/phone within reach;with bed alarm set Nurse Communication: Mobility status PT Visit Diagnosis: Unsteadiness on feet (R26.81);Pain     Time: 1500-1530 PT Time Calculation (min) (ACUTE ONLY): 30 min  Charges:  $Gait Training: 8-22 mins                     Mauro Kaufmann PT Acute Rehabilitation Services Pager [phone] Office [phone]    Laurie [name] 02/16/2021, 4:56 PM

## 2021-02-17 LAB — GLUCOSE, CAPILLARY
Glucose-Capillary: 140 mg/dL — ABNORMAL HIGH (ref 70–99)
Glucose-Capillary: 158 mg/dL — ABNORMAL HIGH (ref 70–99)
Glucose-Capillary: 165 mg/dL — ABNORMAL HIGH (ref 70–99)
Glucose-Capillary: 168 mg/dL — ABNORMAL HIGH (ref 70–99)

## 2021-02-17 MED ORDER — SODIUM CHLORIDE 0.9% FLUSH
5.0000 mL | Freq: Every day | INTRAVENOUS | Status: DC
  Administered 2021-02-18 – 2021-02-26 (×6): 5 mL

## 2021-02-17 NOTE — Progress Notes (Signed)
PT Cancellation Note  Patient Details Name: Laurie [name] MRN: [id number] DOB: 05-26-1955   Cancelled Treatment:    Reason Eval/Treat Not Completed: Other (comment) Pt reports just not feeling like therapy today.  She has been OOB with nursing.  Will f/u as able.  Anise Salvo, PT Acute Rehab Services Pager [phone] [facility] Rehab [phone]   Rayetta Humphrey 02/17/2021, 2:28 PM

## 2021-02-17 NOTE — Progress Notes (Signed)
Referring Physician(s): CCS  Supervising Physician: Mir, Mauri Reading  Patient Status:  [facility] - In-pt  Chief Complaint:  Status post repair of incarcerated ventral hernia on 02/06/21, complicated by development of postoperative fluid collection of the anterior peritoneal cavity immediately deep to the abdominal wall. S/p drain placement by Dr. Fredia Sorrow on 02/11/21.   Subjective:  Pt laying in bed, NAD. No complaints regarding the drain. Denies N/V/ abdominal pain. Patient underwent CT A/P w yesterday which showed resolution of the previous fluid collection in the anterior abdomen status post percutaneous drainage catheter placement.   Allergies: Buchu-cornsilk-ch grass-hydran, Celebrex [celecoxib], Ciprofloxacin, Cymbalta [duloxetine hcl], Dilaudid [hydromorphone hcl], Fentanyl and related, Glucophage [metformin hcl], Oxycodone, Pregabalin, Hydrocodone-acetaminophen, Morphine and related, Sulfa antibiotics, and Vicodin [hydrocodone-acetaminophen]  Medications: Prior to Admission medications   Medication Sig Start Date End Date Taking? Authorizing Provider  albuterol (PROVENTIL HFA;VENTOLIN HFA) 108 (90 BASE) MCG/ACT inhaler Inhale 2 puffs into the lungs every 4 (four) hours as needed for wheezing or shortness of breath.   Yes [provider]  amLODipine (NORVASC) 10 MG tablet Take 10 mg by mouth daily.    Yes [provider]  aspirin EC 81 MG tablet Take 81 mg by mouth daily.   Yes [provider]  budesonide-formoterol (SYMBICORT) 80-4.5 MCG/ACT inhaler Inhale 2 puffs into the lungs 2 (two) times daily.   Yes [provider]  cholecalciferol (VITAMIN D) 1000 UNITS tablet Take 1,000 Units by mouth daily.   Yes [provider]  cyanocobalamin 1000 MCG tablet Take 1,000 mcg by mouth daily.    Yes [provider]  dicyclomine (BENTYL) 20 MG tablet Take 20 mg 2 (two) times daily by mouth. 08/30/16 02/07/21 Yes [provider]   FLUoxetine (PROZAC) 20 MG capsule Take 20 mg by mouth daily.    Yes [provider]  fluticasone (FLONASE) 50 MCG/ACT nasal spray Place 2 sprays into both nostrils daily as needed for allergies or rhinitis.   Yes [provider]  glucagon 1 MG injection Inject 1 mg into the vein once as needed (severe insulin reaction).   Yes [provider]  hydrocortisone (ANUSOL-HC) 2.5 % rectal cream Place 1 application rectally 3 (three) times daily as needed for hemorrhoids.    Yes [provider]  insulin glargine (LANTUS) 100 UNIT/ML injection Inject 50 Units into the skin at bedtime.   Yes [provider]  isosorbide mononitrate (IMDUR) 60 MG 24 hr tablet Take 60 mg by mouth daily.   Yes [provider]  lidocaine (XYLOCAINE) 5 % ointment Apply 1 application topically 4 (four) times daily as needed for moderate pain.   Yes [provider]  LIRAGLUTIDE [place] Inject 1.8 mg into the skin daily.   Yes [provider]  lisinopril (ZESTRIL) 5 MG tablet Take 5 mg by mouth daily. 08/02/16  Yes [provider]  loperamide (IMODIUM) 2 MG capsule Take 4 mg by mouth daily as needed for diarrhea or loose stools.   Yes [provider]  magnesium oxide (MAG-OX) 400 MG tablet Take 400 mg by mouth 2 (two) times daily.   Yes [provider]  metFORMIN (GLUCOPHAGE-XR) 500 MG 24 hr tablet Take 500 mg by mouth daily with breakfast.   Yes [provider]  metoprolol succinate (TOPROL-XL) 100 MG 24 hr tablet Take 100 mg by mouth daily. Take with or immediately following a meal.   Yes [provider]  Multiple Vitamin (MULTIVITAMIN WITH MINERALS) TABS tablet Take 1  tablet by mouth daily.   Yes [provider]  nitroGLYCERIN (NITROSTAT) 0.4 MG SL tablet Place 0.4 mg under the tongue every 5 (five) minutes as needed for chest pain.   Yes [provider]  Nitroglycerin 0.4 % OINT Place 1 application  rectally 2 (two) times daily as needed (for chest pain.).    Yes [provider]  pantoprazole (PROTONIX) 20 MG tablet Take 20 mg by mouth daily.   Yes [provider]  promethazine (PHENERGAN) 25 MG tablet Take 25 mg by mouth every 6 (six) hours as needed for nausea or vomiting.   Yes [provider]  ranolazine (RANEXA) 500 MG 12 hr tablet Take 500 mg by mouth 2 (two) times daily.   Yes [provider]  rosuvastatin (CRESTOR) 10 MG tablet Take 10 mg by mouth daily.   Yes [provider]  temazepam (RESTORIL) 7.5 MG capsule Take 7.5 mg by mouth at bedtime as needed for sleep.   Yes [provider]  traMADol (ULTRAM) 50 MG tablet Take 1 tablet (50 mg total) every 6 (six) hours as needed by mouth. 02/15/17  Yes Bethel Born, PA-C  traZODone (DESYREL) 50 MG tablet Take 50 mg by mouth at bedtime.   Yes [provider]  triamcinolone cream (KENALOG) 0.5 % Apply 1 application topically 2 (two) times daily as needed (for skin irritation).    Yes [provider]  gabapentin (NEURONTIN) 300 MG capsule Take 600 mg by mouth 2 (two) times daily.    [provider]     Vital Signs: BP 119/71 (BP Location: Left Arm)   Pulse 67   Temp (!) 97.4 F (36.3 C) (Oral)   Resp 18   Ht 5\' 4"  (1.626 m)   Wt 200 lb (90.7 kg)   SpO2 97%   BMI 34.33 kg/m   Physical Exam Vitals reviewed.  Constitutional:      General: She is not in acute distress.    Appearance: She is well-developed. She is ill-appearing.  HENT:     Head: Normocephalic and atraumatic.  Pulmonary:     Effort: Pulmonary effort is normal.  Abdominal:     Palpations: Abdomen is soft.     Comments: Wound vac mid abdomen   Skin:    General: Skin is warm and dry.     Coloration: Skin is not cyanotic or jaundiced.     Comments: Positive RLQ drain to a suction bulb. Site is unremarkable with no erythema, edema, tenderness, bleeding or drainage. Suture and stat  lock in place. Dressing is clean, dry, and intact. 25 ml of tan colored fluid noted in the bulb. Drain aspirates and flushes well.    Neurological:     Mental Status: She is alert.     Comments: At baseline   Psychiatric:        Mood and Affect: Mood normal.        Behavior: Behavior normal.    Imaging: CT ABDOMEN PELVIS W CONTRAST  Result Date: 02/16/2021 CLINICAL DATA:  Abdominal abscess, infection suspected EXAM: CT ABDOMEN AND PELVIS WITH CONTRAST TECHNIQUE: Multidetector CT imaging of the abdomen and pelvis was performed using the standard protocol following bolus administration of intravenous contrast. CONTRAST:  81mL OMNIPAQUE IOHEXOL 350 MG/ML SOLN COMPARISON:  CT abdomen and pelvis 02/10/2021 FINDINGS: Lower chest: Elevated right hemidiaphragm and subsegmental atelectatic changes in the right lung base. Hepatobiliary: No focal liver abnormality is seen. Status post cholecystectomy. No biliary dilatation. Pancreas: Unremarkable.  No pancreatic ductal dilatation or surrounding inflammatory changes. Spleen: Normal in size without focal abnormality. Adrenals/Urinary Tract: Bilateral adrenal thickening and an 11 mm nodular density in the left adrenal gland, unchanged. Kidneys appear stable and within normal limits. Urinary bladder is surgically absent. 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 Associated right abdominal urinary ostomy. Stomach/Bowel: No bowel obstruction, free air or pneumatosis identified. Colonic diverticulosis without evidence of acute diverticulitis. Mild wall thickening of loops of small bowel in the anterior mid abdomen adjacent to previous surgical changes. Vascular/Lymphatic: Aortic atherosclerosis. No enlarged abdominal or pelvic lymph nodes. Reproductive: Status post hysterectomy. No adnexal masses. Other: Redemonstration of surgical changes from recent umbilical  hernia repair. Interval placement of a percutaneous drainage catheter in the anterior abdomen with essentially complete resolution of the previous fluid collection. A few small foci of residual air identified in the region. Similar appearing mild fat stranding about the umbilicus no new fluid collection identified. No frank ascites. Musculoskeletal: No suspicious bony lesions identified. IMPRESSION: 1. Recent umbilical hernia surgical repair changes again seen. Interval resolution of the previous fluid collection in the anterior abdomen status post percutaneous drainage catheter placement. A few small foci of residual air in the region. Stable appearing soft tissue fat stranding about the umbilicus with no new collection identified. 2. Previous cystectomy and urinary diversion changes, unchanged. 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 3. Colonic diverticulosis. 4. Other findings as described. Electronically Signed   By: Jannifer Hick M.D.   On: 02/16/2021 13:57    Labs:  CBC: Recent Labs    02/13/21 0442 02/14/21 0504 02/15/21 0508 02/16/21 0445  WBC 14.0* 13.0* 13.6* 9.4  HGB 10.5* 11.0* 11.1* 10.2*  HCT 32.0* 34.1* 34.8* 32.2*  PLT 344 375 397 362    COAGS: No results for input(s): INR, APTT in the last 8760 hours.  BMP: Recent Labs    02/13/21 0442 02/14/21 0504 02/15/21 0508 02/16/21 0445  NA 141 141 139 140  K 3.4* 3.3* 3.7 4.0  CL 111 110 111 112*  CO2 20* 23 21* 20*  GLUCOSE 156* 161* 160* 141*  BUN 10 11 18 17   CALCIUM 8.9 8.9 9.0 8.9  CREATININE 0.85 0.98 1.40* 1.05*  GFRNONAA >60 >60 41* 59*    LIVER FUNCTION TESTS: Recent Labs    02/06/21 1315 02/07/21 0546 02/11/21 0513 02/12/21 0532  BILITOT 0.4 0.7 0.6 0.7  AST 22 25 24 26   ALT 18 18 18 23   ALKPHOS 58 55 85 90  PROT 8.0 6.7 7.1 6.9  ALBUMIN 4.1 3.3* 2.7* 2.7*    Assessment and Plan:  65 y.o. female status post repair of incarcerated ventral hernia on 02/06/21, complicated by development of  postoperative fluid collection of the anterior peritoneal cavity immediately deep to the abdominal wall. S/p drain placement by Dr. on 02/11/21.    No drain OP documented x 2 days - asked RN to document OP q shift.  WBC normalized yesterday.  CT A/P W yesterday showed resolution of the fluid collection.  CT reviewed by Dr. 02/08/21, patient who recommends keep the drain due to high OP, change flushing regiment to qd instead of TID.  Order modified and RN notified.   Drain Location: RLQ Size: Fr size: 10 Fr Date of placement: 11/5 - Dr. 13/5/22   Currently to: Drain collection device: suction bulb 24 hour output:  Output by Bryn Gulling (  mL) 02/15/21 0701 - 02/15/21 1900 02/15/21 1901 - 02/16/21 0700 02/16/21 0701 - 02/16/21 1900 02/16/21 1901 - 02/17/21 0700 02/17/21 0701 - 02/17/21 0957  Closed System Drain 1 Right RUQ Bulb (JP) 10 Fr.       Negative Pressure Wound Therapy Abdomen Mid  50       Interval imaging/drain manipulation:  CT A/P w on 11/10 - showed resolution of the fluid collection   Current examination: Flushes/aspirates easily.  Insertion site unremarkable. Suture and stat lock in place. Dressed appropriately.   Plan: Change to qd flush with 5 cc NS. Record output Q shift. Dressing changes QD or PRN if soiled.  Call IR APP or on call IR MD if difficulty flushing or sudden change in drain output.  Repeat imaging/possible drain injection once output < 10 mL/QD (excluding flush material.)  Discharge planning: Please contact IR APP or on call IR MD prior to patient d/c to ensure appropriate follow up plans are in place. Typically patient will follow up with IR clinic 10-14 days post d/c for repeat imaging/possible drain injection. IR scheduler will contact patient with date/time of appointment. Patient will need to flush drain QD with 5 cc NS, record output QD, dressing changes every 2-3 days or earlier if soiled.   Further treatment plan per CCS/ [facility] Appreciate and  agree with the plan.  IR to follow.    Electronically Signed: Willette Brace, PA-C 02/17/2021, 8:26 AM   I spent a total of 25 Minutes at the the patient's bedside AND on the patient's hospital floor or unit, greater than 50% of which was counseling/coordinating care for intra-abd fluid collection drain.   This chart was dictated using voice recognition software.  Despite best efforts to proofread,  errors can occur which can change the documentation meaning.

## 2021-02-17 NOTE — Progress Notes (Signed)
Laurie [name]  PQZ:[id number] DOB: 1955/05/12 DOA: 02/06/2021 PCP: Margit Hanks, MD    Brief Narrative:  32LK w/ a hx of DM2, HTN, HLD, anxiety/depression, GERD, obesity, and multiple abdomino-pelvic surgeries who presented to the ED with 5 days of worsening lower abdominal pain associated with loose stools and vomiting. In the ED CT revealed an incarcerated ventral hernia. EDP attempted reduction though symptoms did not improve. Labs notable for acute renal failure in the setting of dehydration. She was transferred to [facility] ED for surgical consult, and repeat CT abdomen showed persistent hernia with bowel obstruction for which she was taken to the OR.    Today, pt denies any new complaints, except for mild post op abd pain.    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 Vonita Moss at [facility] - history of cystectomy and right colon pouch for interstitial cystitis 1990s - pouch stone is found to be nonobstructing  DM2 SSI, accuchecks  HTN BP stable  COPD Stable Continue usual Dulera and as needed albuterol  GERD PPI  Obesity - Body mass index is 34.33 kg/m. Lifestyle modification advised    Significant events: 10/31 ventral hernia repair with small bowel resection and LOA 11/5 CT-guided drainage of anterior peritoneal abscess in IR -10 French drain left in place none  Consultants:  General Surgery IR  Code Status: FULL CODE  Antimicrobials:  Zosyn 11/6 >  DVT  prophylaxis: Lovenox  Family Communication: None at bedside  Status is: Inpatient -ultimate disposition will be discharge home -she has many family members and an extensive support network   Objective: Blood pressure 139/71, pulse 72, temperature (!) 97.5 F (36.4 C), temperature source Oral, resp. rate 20, height 5\' 4"  (1.626 m), weight 90.7 kg, SpO2 98 %.  Intake/Output Summary (Last 24 hours) at 02/17/2021 1738 Last data filed at 02/16/2021 2356 Gross per 24 hour  Intake --  Output 700 ml  Net -700 ml   Filed Weights   02/06/21 1053  Weight: 90.7 kg    Examination: General: NAD  Cardiovascular: S1, S2 present Respiratory: CTAB Abdomen: Soft, mildly tender, nondistended, bowel sounds present, incision clean/dry/intact, wound vac noted Musculoskeletal: No bilateral pedal edema noted Skin: As above Psychiatry: Normal mood    CBC: Recent Labs  Lab 02/14/21 0504 02/15/21 0508 02/16/21 0445  WBC 13.0* 13.6* 9.4  NEUTROABS  --   --  6.1  HGB 11.0* 11.1* 10.2*  HCT 34.1* 34.8* 32.2*  MCV 98.8 100.9* 101.3*  PLT 375 397 362   Basic Metabolic Panel: Recent Labs  Lab 02/11/21 0513 02/12/21 0532 02/13/21 0442 02/14/21 0504 02/15/21 0508 02/16/21 0445  NA 145 145 141 141 139 140  K 4.2 4.0 3.4* 3.3* 3.7 4.0  CL 116* 114* 111 110 111 112*  CO2 21* 21* 20* 23 21* 20*  GLUCOSE 167* 153* 156* 161* 160* 141*  BUN 17 10 10 11 18 17   CREATININE 0.99 0.70 0.85 0.98 1.40* 1.05*  CALCIUM 9.0 9.2 8.9 8.9 9.0 8.9  MG 1.9 1.6* 1.8  --  1.8  --  PHOS 3.0 3.4  --   --   --   --    GFR: Estimated Creatinine Clearance: 57.5 mL/min (A) (by C-G formula based on SCr of 1.05 mg/dL (H)).  Liver Function Tests: Recent Labs  Lab 02/11/21 0513 02/12/21 0532  AST 24 26  ALT 18 23  ALKPHOS 85 90  BILITOT 0.6 0.7  PROT 7.1 6.9  ALBUMIN 2.7* 2.7*  No results for input(s): LIPASE, AMYLASE in the last 168 hours.   HbA1C: Hgb A1c MFr Bld  Date/Time Value Ref Range Status   02/07/2021 05:46 AM 6.2 (H) 4.8 - 5.6 % Final    Comment:    (NOTE) Pre diabetes:          5.7%-6.4%  Diabetes:              >6.4%  Glycemic control for   <7.0% adults with diabetes   06/25/2015 05:06 AM 6.2 (H) 4.8 - 5.6 % Final    Comment:    (NOTE)         Pre-diabetes: 5.7 - 6.4         Diabetes: >6.4         Glycemic control for adults with diabetes: <7.0     CBG: Recent Labs  Lab 02/16/21 1652 02/16/21 2154 02/17/21 0839 02/17/21 1210 02/17/21 1700  GLUCAP 173* 129* 140* 158* 165*    Recent Results (from the past 240 hour(s))  MRSA Next Gen by PCR, Nasal     Status: None   Collection Time: 02/09/21  8:29 AM   Specimen: Nasal Mucosa; Nasal Swab  Result Value Ref Range Status   MRSA by PCR Next Gen NOT DETECTED NOT DETECTED Final    Comment: (NOTE) The GeneXpert MRSA Assay (FDA approved for NASAL specimens only), is one component of a comprehensive MRSA colonization surveillance program. It is not intended to diagnose MRSA infection nor to guide or monitor treatment for MRSA infections. Test performance is not FDA approved in patients less than 27 years old. Performed at [facility], [address], [place], [place] [address]   Aerobic/Anaerobic Culture w Gram Stain (surgical/deep wound)     Status: None   Collection Time: 02/11/21  1:56 PM   Specimen: Abscess  Result Value Ref Range Status   Specimen Description   Final    ABSCESS Performed at [facility], [address], [place], [place] [address]    Special Requests   Final    Normal Performed at [facility], [address] [place], [place] [address]    Gram Stain   Final    NO SQUAMOUS EPITHELIAL CELLS SEEN ABUNDANT WBC SEEN MODERATE GRAM NEGATIVE RODS MODERATE GRAM POSITIVE COCCI    Culture   Final    ABUNDANT ESCHERICHIA COLI NO ANAEROBES ISOLATED Performed at [facility], [address], [place], [place] [address]     Report Status 02/15/2021 FINAL  Final   Organism ID, Bacteria ESCHERICHIA COLI  Final      Susceptibility   Escherichia coli - MIC*    AMPICILLIN >=32 RESISTANT Resistant     CEFAZOLIN >=64 RESISTANT Resistant     CEFEPIME <=0.12 SENSITIVE Sensitive     CEFTAZIDIME 16 INTERMEDIATE Intermediate     CEFTRIAXONE 32 RESISTANT Resistant     CIPROFLOXACIN <=0.25 SENSITIVE Sensitive     GENTAMICIN <=1 SENSITIVE Sensitive     IMIPENEM <=0.25 SENSITIVE Sensitive     TRIMETH/SULFA <=20 SENSITIVE Sensitive  AMPICILLIN/SULBACTAM >=32 RESISTANT Resistant     PIP/TAZO <=4 SENSITIVE Sensitive     * ABUNDANT ESCHERICHIA COLI      Scheduled Meds:  enoxaparin (LOVENOX) injection  40 mg Subcutaneous Q24H   feeding supplement  1 Container Oral TID BM   insulin aspart  0-5 Units Subcutaneous QHS   insulin aspart  0-9 Units Subcutaneous TID WC   melatonin  3 mg Oral QHS   methocarbamol  500 mg Oral Q6H   metoprolol succinate  50 mg Oral Daily   mometasone-formoterol  2 puff Inhalation BID   pantoprazole  40 mg Oral Daily   polycarbophil  625 mg Oral BID   polyethylene glycol  17 g Oral Daily   QUEtiapine  12.5 mg Oral QHS   saccharomyces boulardii  250 mg Oral BID   sodium chloride flush  3 mL Intravenous Q12H   [START ON 02/18/2021] sodium chloride flush  5 mL Intracatheter Daily   Continuous Infusions:  piperacillin-tazobactam (ZOSYN)  IV 3.375 g (02/17/21 1222)     LOS: 10 days   Briant Cedar, MD [facility]   If 7PM-7AM, please contact night-coverage per Amion 02/17/2021, 5:38 PM

## 2021-02-17 NOTE — TOC Transition Note (Signed)
Transition of Care [facility]) - CM/SW Discharge Note   Patient Details  Name: Laurie [name] MRN: [id number] Date of Birth: [birth year]  [facility] CM/SW Contact:  Ida Rogue, LCSW Phone Number: 02/17/2021, 1:53 PM   Clinical Narrative:   Patient who is stable for d/c will go home with wound vac, as delivered by [facility] today.  Also DME as delivered by [facility] today.  Frances Furbish will follow up with [facility] services on Monday. No further needs identifed. TOC will continue to follow during the course of hospitalization.     Final next level of care: Home w Home Health Services Barriers to Discharge: No Barriers Identified   Patient Goals and CMS Choice     Choice offered to / list presented to : Patient, Adult Children, Sibling  Discharge Placement                       Discharge Plan and Services   Discharge Planning Services: CM Consult Post Acute Care Choice: Home Health                               Social Determinants of Health (SDOH) Interventions     Readmission Risk Interventions No flowsheet data found.

## 2021-02-17 NOTE — Progress Notes (Signed)
    Wound vac changed with bedside RN and attending. Dr. Michaell Cowing trimmed fibrinous tissue at the base (see before and after). Fascia intact at the base. Wound with healthy, beefy red granulation tissue. Wound vac replaced.   Leary Roca, [facility] 02/17/2021, 4:40 PM

## 2021-02-17 NOTE — Progress Notes (Signed)
Patient ID: Kollins Fenter, female   DOB: [birth year], 65 y.o.   MRN: [id number] [facility] Surgery Progress Note  11 Days Post-Op  Subjective: CC-  Having some abdominal pain this morning, but states that she was just moving around. Some nausea but no emesis. Ate well yesterday. BM yesterday.  Afebrile, VSS  Objective: Vital signs in last 24 hours: Temp:  [97.4 F (36.3 C)-97.9 F (36.6 C)] 97.4 F (36.3 C) (11/11 0420) Pulse Rate:  [63-72] 67 (11/11 0420) Resp:  [18-19] 18 (11/11 0420) BP: (119-138)/(71-74) 119/71 (11/11 0420) SpO2:  [96 %-99 %] 97 % (11/11 0806) Last BM Date: 02/16/21  Intake/Output from previous day: 11/10 0701 - 11/11 0700 In: -  Out: 700 [Urine:700] Intake/Output this shift: No intake/output data recorded.  PE: Gen:  Alert, NAD, pleasant Pulm: rate and effort normal Abd: vac to midline with good seal, soft, nondistended, tender over midline and left hemiabdomen without rebound or guarding, drain output purulent  Lab Results:  Recent Labs    02/15/21 0508 02/16/21 0445  WBC 13.6* 9.4  HGB 11.1* 10.2*  HCT 34.8* 32.2*  PLT 397 362   BMET Recent Labs    02/15/21 0508 02/16/21 0445  NA 139 140  K 3.7 4.0  CL 111 112*  CO2 21* 20*  GLUCOSE 160* 141*  BUN 18 17  CREATININE 1.40* 1.05*  CALCIUM 9.0 8.9   PT/INR No results for input(s): LABPROT, INR in the last 72 hours. CMP     Component Value Date/Time   NA 140 02/16/2021 0445   K 4.0 02/16/2021 0445   CL 112 (H) 02/16/2021 0445   CO2 20 (L) 02/16/2021 0445   GLUCOSE 141 (H) 02/16/2021 0445   BUN 17 02/16/2021 0445   CREATININE 1.05 (H) 02/16/2021 0445   CALCIUM 8.9 02/16/2021 0445   PROT 6.9 02/12/2021 0532   ALBUMIN 2.7 (L) 02/12/2021 0532   AST 26 02/12/2021 0532   ALT 23 02/12/2021 0532   ALKPHOS 90 02/12/2021 0532   BILITOT 0.7 02/12/2021 0532   GFRNONAA 59 (L) 02/16/2021 0445   GFRAA 33 (L) 07/01/2015 0352   Lipase     Component Value Date/Time   LIPASE  34 02/06/2021 1315       Studies/Results: CT ABDOMEN PELVIS W CONTRAST  Result Date: 02/16/2021 CLINICAL DATA:  Abdominal abscess, infection suspected EXAM: CT ABDOMEN AND PELVIS WITH CONTRAST TECHNIQUE: Multidetector CT imaging of the abdomen and pelvis was performed using the standard protocol following bolus administration of intravenous contrast. CONTRAST:  37mL OMNIPAQUE IOHEXOL 350 MG/ML SOLN COMPARISON:  CT abdomen and pelvis 02/10/2021 FINDINGS: Lower chest: Elevated right hemidiaphragm and subsegmental atelectatic changes in the right lung base. Hepatobiliary: No focal liver abnormality is seen. Status post cholecystectomy. No biliary dilatation. Pancreas: Unremarkable. No pancreatic ductal dilatation or surrounding inflammatory changes. Spleen: Normal in size without focal abnormality. Adrenals/Urinary Tract: Bilateral adrenal thickening and an 11 mm nodular density in the left adrenal gland, unchanged. Kidneys appear stable and within normal limits. Urinary bladder is surgically absent. 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 Associated right abdominal urinary ostomy. Stomach/Bowel: No bowel obstruction, free air or pneumatosis identified. Colonic diverticulosis without evidence of acute diverticulitis. Mild wall thickening of loops of small bowel in the anterior mid abdomen adjacent to previous surgical changes. Vascular/Lymphatic: Aortic atherosclerosis. No enlarged abdominal or pelvic lymph nodes. Reproductive: Status post hysterectomy.  No adnexal masses. Other: Redemonstration of surgical changes from recent umbilical hernia repair. Interval placement of a percutaneous drainage catheter in the anterior abdomen with essentially complete resolution of the previous fluid collection. A few small foci of residual air identified in the region. Similar appearing mild fat stranding about the  umbilicus no new fluid collection identified. No frank ascites. Musculoskeletal: No suspicious bony lesions identified. IMPRESSION: 1. Recent umbilical hernia surgical repair changes again seen. Interval resolution of the previous fluid collection in the anterior abdomen status post percutaneous drainage catheter placement. A few small foci of residual air in the region. Stable appearing soft tissue fat stranding about the umbilicus with no new collection identified. 2. Previous cystectomy and urinary diversion changes, unchanged. 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 3. Colonic diverticulosis. 4. Other findings as described. Electronically Signed   By: Jannifer Hick M.D.   On: 02/16/2021 13:57    Anti-infectives: Anti-infectives (From admission, onward)    Start     Dose/Rate Route Frequency Ordered Stop   02/12/21 1200  piperacillin-tazobactam (ZOSYN) IVPB 3.375 g        3.375 g 12.5 mL/hr over 240 Minutes Intravenous Every 8 hours 02/12/21 0950     02/06/21 2003  ceFAZolin (ANCEF) 2-4 GM/100ML-% IVPB       Note to Pharmacy: Ponciano Ort   : cabinet override      02/06/21 2003 02/06/21 2046   02/06/21 1945  ceFAZolin (ANCEF) IVPB 2g/100 mL premix        2 g 200 mL/hr over 30 Minutes Intravenous On call to O.R. 02/06/21 1914 02/06/21 2028        Assessment/Plan Ventral hernia with SBO POD#11 S/P ventral hernia repair with small bowel resection, LOA 10/31 Dr. Sheliah Hatch - wound vac MWF - will need vac at home, order placed.  - s/p IR drain 11/5, culture with somewhat resistant E coli but sensitive to zosyn - continue iv zosyn during admission and plan 2 weeks augmentin at discharge - continue drain and monitor output - PT/OT, encourage mobilization - recommending HH PT - encourage PO intake and continue bowel regimen - CT 11/10 showed resolution of drained fluid collection with drain in place and a few dots of residual air, no new concerning findings. No definite enterocutaneous  fistula but is high risk. Keep drain in place, will need follow up in IR with drain injection prior to considering removal. - Will assess wound later today during vac change with nurse. If stable and she is doing well may be able to go home with drain and PO antibiotics this afternoon versus over the weekend   FEN: reg diet VTE: lovenox ID: Ancef pre-op, zosyn 11/6>>day#6   Hx of cystectomy with urostomy in place - large stone noted in ileal conduit on CT, OP follow up with [facility] recommended  AKI on CKD - Cr improving T2DM with gastroparesis - SSI HTN Chronic candidal esophagitis  GERD Hx of Hep C Glaucoma  B12 deficiency and iron deficiency anemia  CAD Hx of asthma    LOS: 10 days    Franne Forts, [facility] Surgery 02/17/2021, 11:50 AM Please see Amion for pager number during day hours 7:00am-4:30pm

## 2021-02-18 ENCOUNTER — Inpatient Hospital Stay (HOSPITAL_COMMUNITY): Payer: Medicare PPO

## 2021-02-18 LAB — URINALYSIS, ROUTINE W REFLEX MICROSCOPIC
Bacteria, UA: NONE SEEN
Bilirubin Urine: NEGATIVE
Glucose, UA: NEGATIVE mg/dL
Hgb urine dipstick: NEGATIVE
Ketones, ur: NEGATIVE mg/dL
Leukocytes,Ua: NEGATIVE
Nitrite: NEGATIVE
Protein, ur: 30 mg/dL — AB
Specific Gravity, Urine: 1.021 (ref 1.005–1.030)
pH: 6 (ref 5.0–8.0)

## 2021-02-18 LAB — CBC
HCT: 37.7 % (ref 36.0–46.0)
Hemoglobin: 12.5 g/dL (ref 12.0–15.0)
MCH: 33.3 pg (ref 26.0–34.0)
MCHC: 33.2 g/dL (ref 30.0–36.0)
MCV: 100.5 fL — ABNORMAL HIGH (ref 80.0–100.0)
Platelets: 577 10*3/uL — ABNORMAL HIGH (ref 150–400)
RBC: 3.75 MIL/uL — ABNORMAL LOW (ref 3.87–5.11)
RDW: 14.2 % (ref 11.5–15.5)
WBC: 9.9 10*3/uL (ref 4.0–10.5)
nRBC: 0 % (ref 0.0–0.2)

## 2021-02-18 LAB — BLOOD GAS, ARTERIAL
Acid-base deficit: 10 mmol/L — ABNORMAL HIGH (ref 0.0–2.0)
Bicarbonate: 14.8 mmol/L — ABNORMAL LOW (ref 20.0–28.0)
O2 Saturation: 97 %
Patient temperature: 98.6
pCO2 arterial: 30.7 mmHg — ABNORMAL LOW (ref 32.0–48.0)
pH, Arterial: 7.305 — ABNORMAL LOW (ref 7.350–7.450)
pO2, Arterial: 108 mmHg (ref 83.0–108.0)

## 2021-02-18 LAB — COMPREHENSIVE METABOLIC PANEL
ALT: 57 U/L — ABNORMAL HIGH (ref 0–44)
AST: 65 U/L — ABNORMAL HIGH (ref 15–41)
Albumin: 3.1 g/dL — ABNORMAL LOW (ref 3.5–5.0)
Alkaline Phosphatase: 86 U/L (ref 38–126)
Anion gap: 11 (ref 5–15)
BUN: 19 mg/dL (ref 8–23)
CO2: 15 mmol/L — ABNORMAL LOW (ref 22–32)
Calcium: 9.4 mg/dL (ref 8.9–10.3)
Chloride: 112 mmol/L — ABNORMAL HIGH (ref 98–111)
Creatinine, Ser: 1.34 mg/dL — ABNORMAL HIGH (ref 0.44–1.00)
GFR, Estimated: 44 mL/min — ABNORMAL LOW (ref >60)
Glucose, Bld: 157 mg/dL — ABNORMAL HIGH (ref 70–99)
Potassium: 3.5 mmol/L (ref 3.5–5.1)
Sodium: 138 mmol/L (ref 135–145)
Total Bilirubin: 0.6 mg/dL (ref 0.3–1.2)
Total Protein: 8.1 g/dL (ref 6.5–8.1)

## 2021-02-18 LAB — GLUCOSE, CAPILLARY
Glucose-Capillary: 162 mg/dL — ABNORMAL HIGH (ref 70–99)
Glucose-Capillary: 174 mg/dL — ABNORMAL HIGH (ref 70–99)
Glucose-Capillary: 142 mg/dL — ABNORMAL HIGH (ref 70–99)
Glucose-Capillary: 127 mg/dL — ABNORMAL HIGH (ref 70–99)

## 2021-02-18 LAB — CBC WITH DIFFERENTIAL/PLATELET
Abs Immature Granulocytes: 0.13 10*3/uL — ABNORMAL HIGH (ref 0.00–0.07)
Basophils Absolute: 0 10*3/uL (ref 0.0–0.1)
Basophils Relative: 0 %
Eosinophils Absolute: 0.1 10*3/uL (ref 0.0–0.5)
Eosinophils Relative: 1 %
HCT: 35.9 % — ABNORMAL LOW (ref 36.0–46.0)
Hemoglobin: 11.8 g/dL — ABNORMAL LOW (ref 12.0–15.0)
Immature Granulocytes: 1 %
Lymphocytes Relative: 9 %
Lymphs Abs: 1 10*3/uL (ref 0.7–4.0)
MCH: 32.4 pg (ref 26.0–34.0)
MCHC: 32.9 g/dL (ref 30.0–36.0)
MCV: 98.6 fL (ref 80.0–100.0)
Monocytes Absolute: 0.9 10*3/uL (ref 0.1–1.0)
Monocytes Relative: 8 %
Neutro Abs: 9.2 10*3/uL — ABNORMAL HIGH (ref 1.7–7.7)
Neutrophils Relative %: 81 %
Platelets: 580 10*3/uL — ABNORMAL HIGH (ref 150–400)
RBC: 3.64 MIL/uL — ABNORMAL LOW (ref 3.87–5.11)
RDW: 14.2 % (ref 11.5–15.5)
WBC: 11.3 10*3/uL — ABNORMAL HIGH (ref 4.0–10.5)
nRBC: 0 % (ref 0.0–0.2)

## 2021-02-18 LAB — MAGNESIUM: Magnesium: 1.9 mg/dL (ref 1.7–2.4)

## 2021-02-18 IMAGING — DX DG CHEST 1V PORT
1 series · 1 of 1 positions shown · non-contrast
Comparison: Radiograph [DATE]

CLINICAL DATA: Altered mental status, abdominal pain

EXAM:
PORTABLE CHEST 1 VIEW

[chest ap]
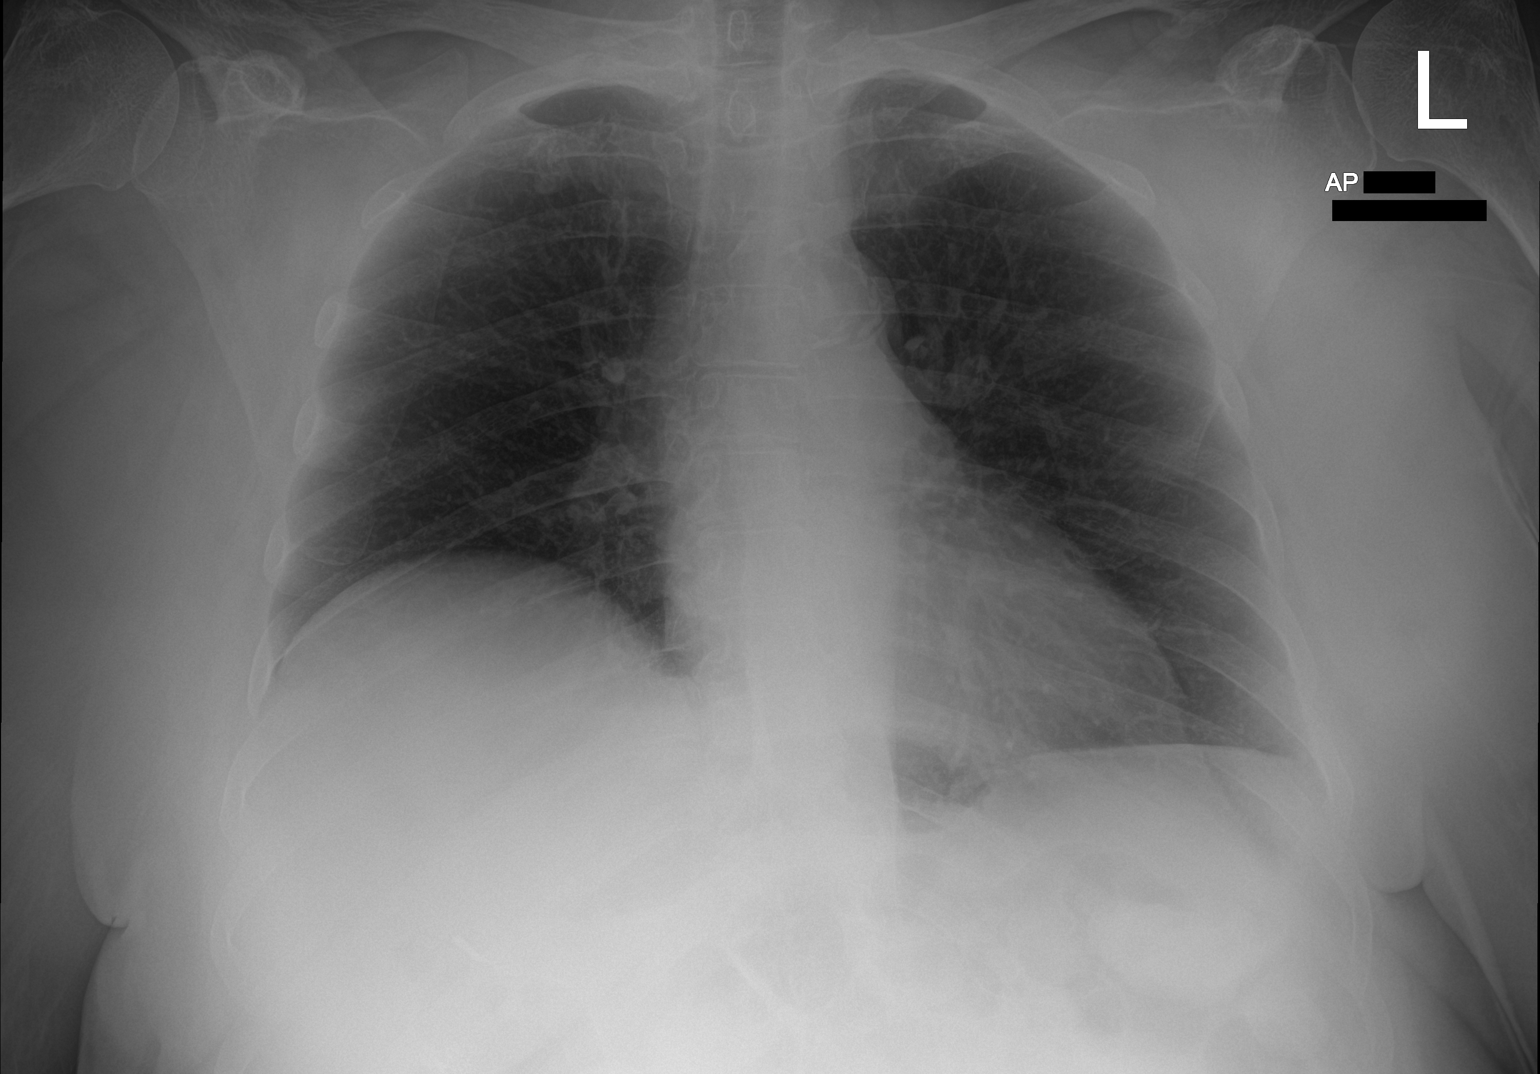

[1 of 1 positions shown; findings below may reference images not displayed]

FINDINGS: Unchanged cardiomediastinal silhouette. There is no focal airspace
consolidation. There is no large pleural effusion or visible
pneumothorax. There is no acute osseous abnormality.
IMPRESSION: No evidence of acute cardiopulmonary disease.

## 2021-02-18 IMAGING — CT CT HEAD W/O CM
4 series · 16 of 47 positions shown, 18 images · non-contrast
Comparison: None.
COMPARISON: None.

Addendum:
CLINICAL DATA: Mental status change, unknown cause

EXAM:
CT HEAD WITHOUT CONTRAST
TECHNIQUE: Contiguous axial images were obtained from the base of the skull
through the vertex without intravenous contrast.

[Series 2: head wo · axial · 0.43mm/px · z∈[-144,-24]mm · 7 of 34 slices shown, 9 images]
[im 5/34  brain]
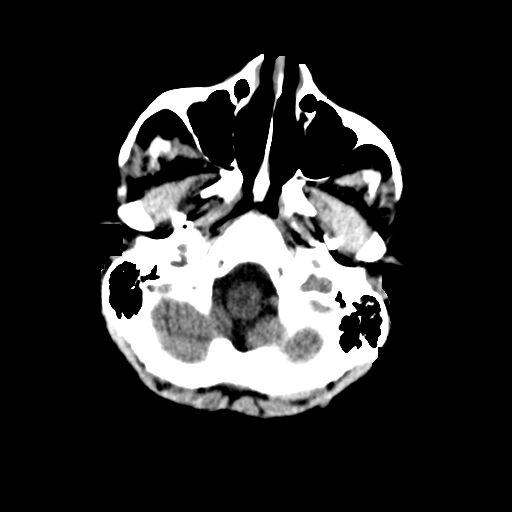
[im 5/34  bone]
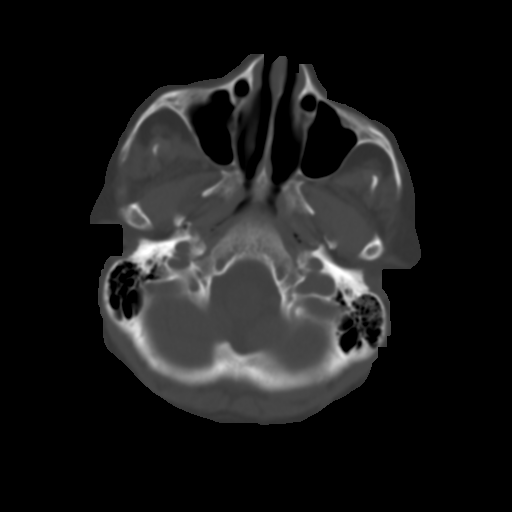
[im 9/34  brain]
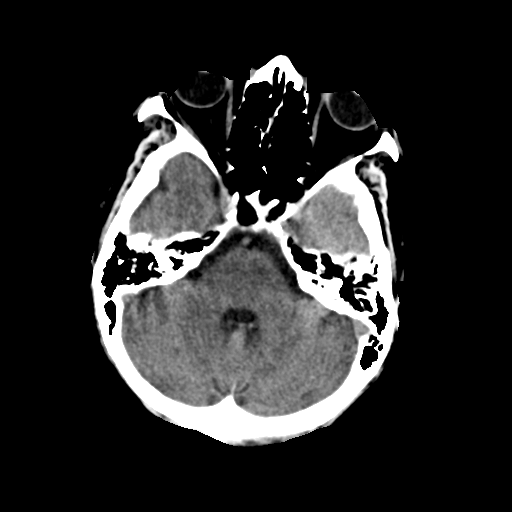
[im 13/34  brain]
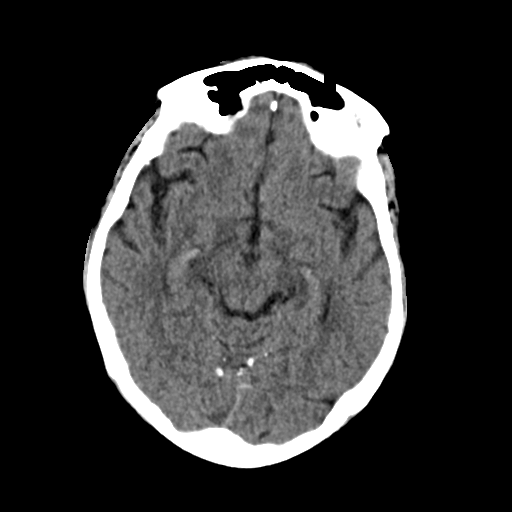
[im 17/34  brain]
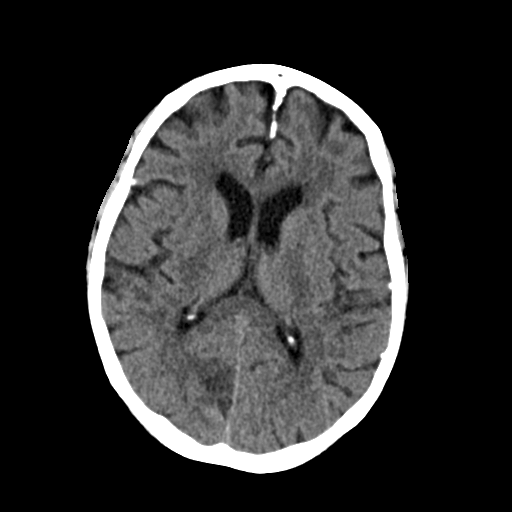
[im 21/34  brain]
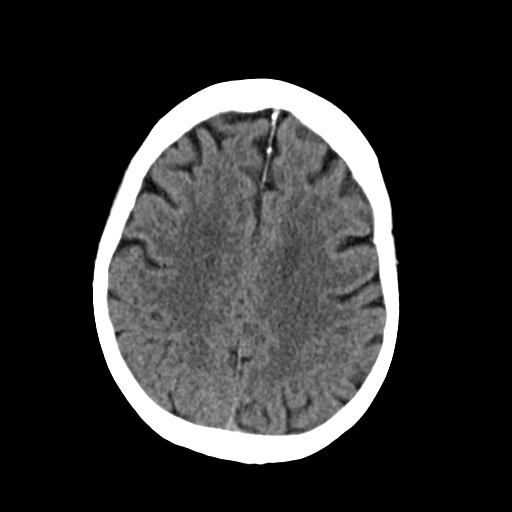
[im 21/34  bone]
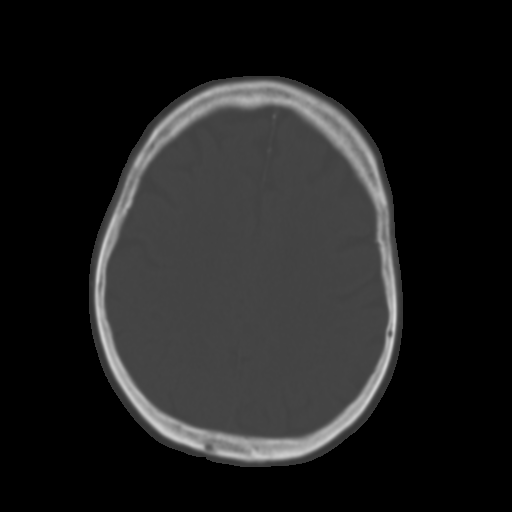
[im 25/34  brain]
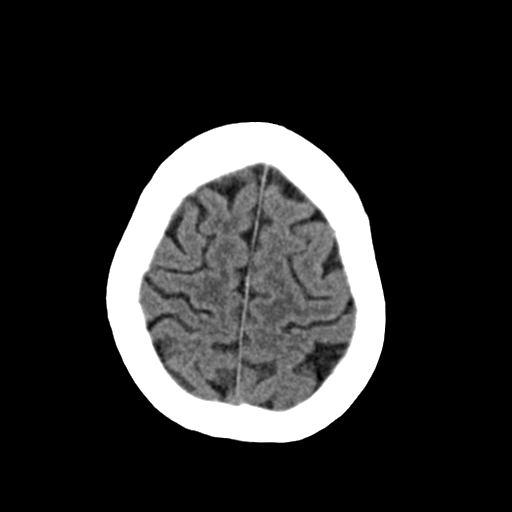
[im 29/34  brain]
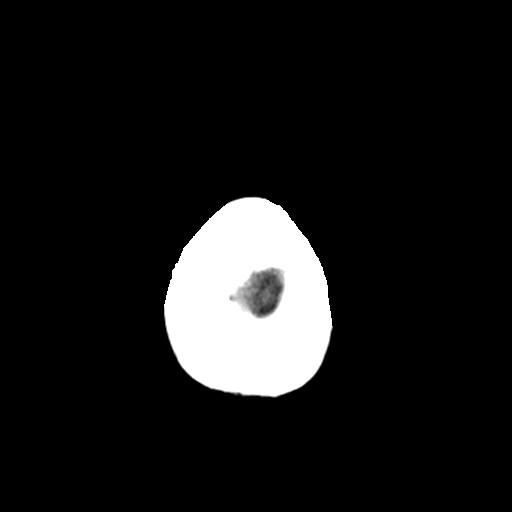

[Series 3: head bone · axial · 0.43mm/px · z∈[-148,-116]mm · 3 of 84 slices shown]
[im 9/84  bone]
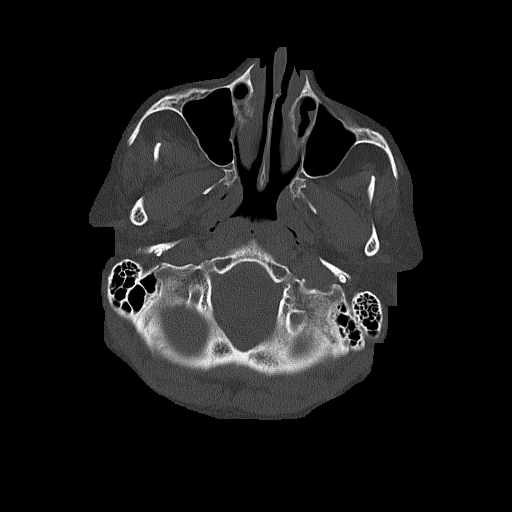
[im 17/84  bone]
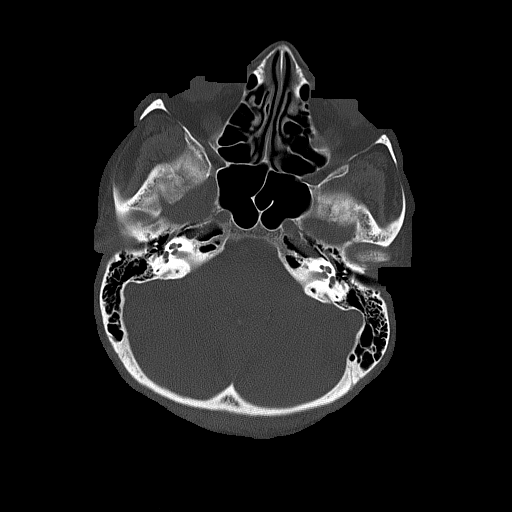
[im 25/84  bone]
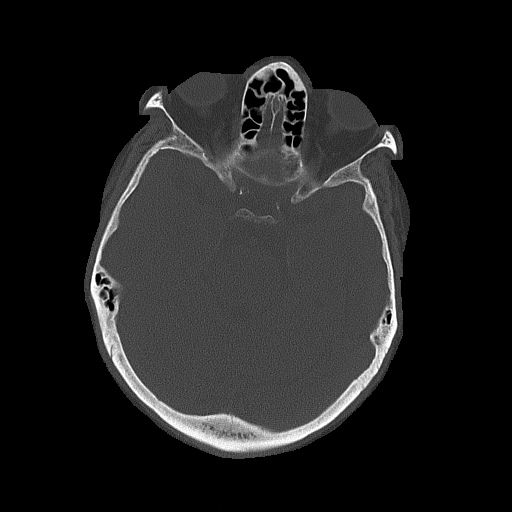

[Series 4: coronal soft tissue · coronal · 0.33mm/px · 3 of 67 slices shown]
[im 23/67  brain]
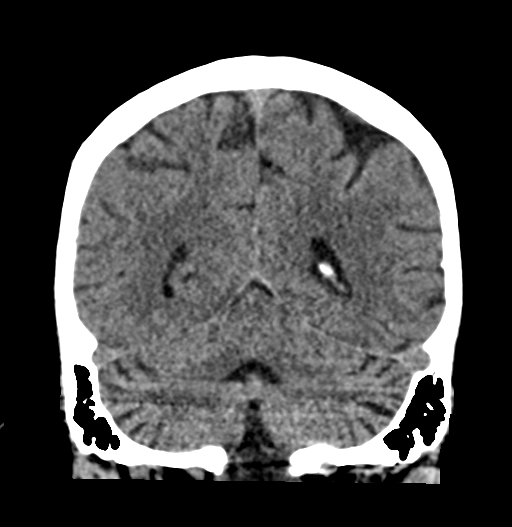
[im 30/67  brain]
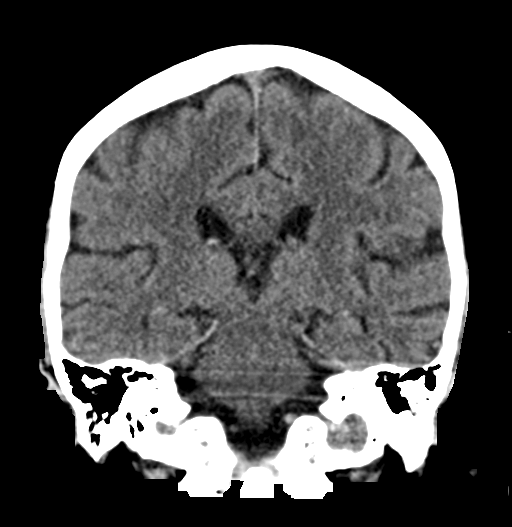
[im 37/67  brain]
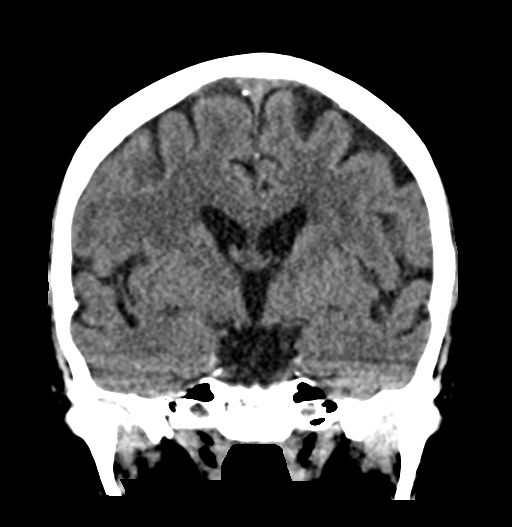

[Series 5: sagittal soft tissue · sagittal · 0.34mm/px · 3 of 57 slices shown]
[im 19/57  brain]
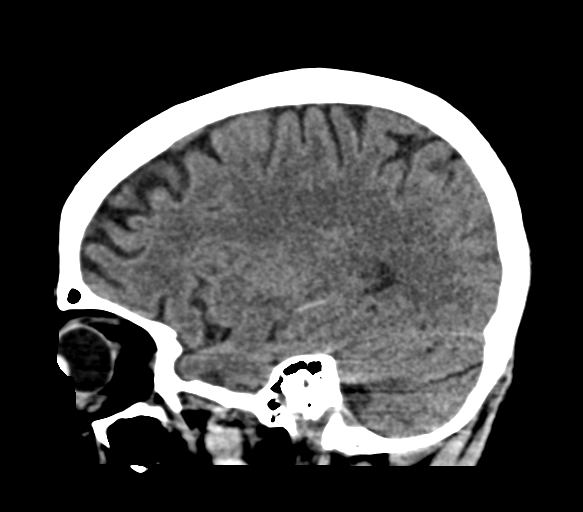
[im 29/57  brain]
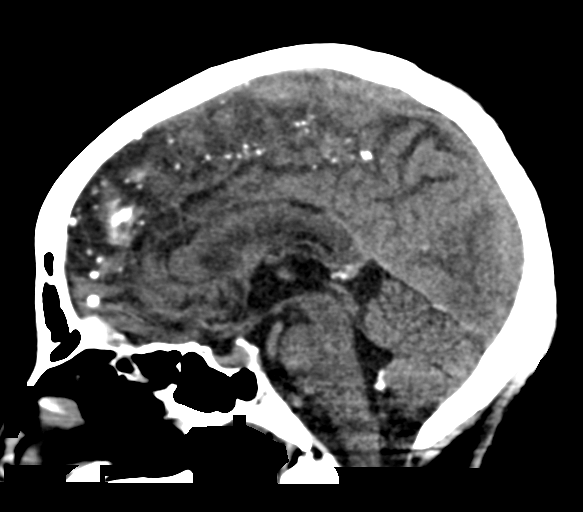
[im 38/57  brain]
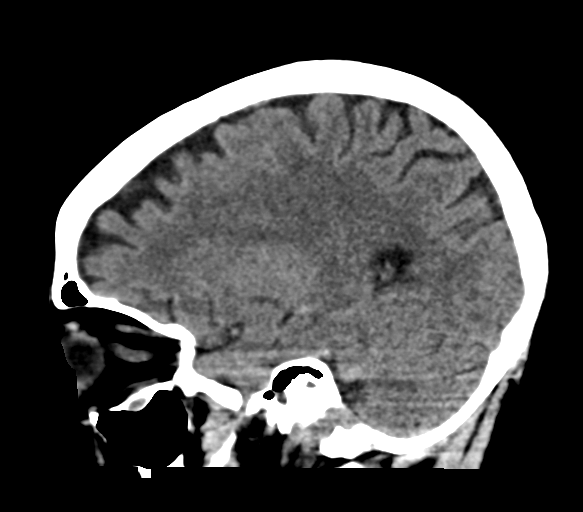

[16 of 47 positions shown; findings below may reference images not displayed]

FINDINGS: Brain: There is hypodensity involving the RIGHT posterior
parieto-occipital lobe cortex (series 2, image 19). No acute
hemorrhage. No midline shift.

Vascular: Vascular calcifications involving the carotid siphons.

Skull: Normal. Negative for fracture or focal lesion.

Sinuses/Orbits: No acute finding.

Other: None.
IMPRESSION: 1. Hypodensity involving the RIGHT posterior parietooccipital lobe
cortex is concerning for infarction. Recommend further dedicated
evaluation with brain MRI.

DOHOON system was activated on [DATE] at [DATE] p.m. at time of
interpretation. Addendum will be issued upon telephonic
communication of results.

ADDENDUM:
These results were called by telephone at the time of interpretation
on [DATE] at [DATE] to provider DOHOON, who verbally
acknowledged these results.

*** End of Addendum ***
FINDINGS: Brain: There is hypodensity involving the RIGHT posterior
parieto-occipital lobe cortex (series 2, image 19). No acute
hemorrhage. No midline shift.

Vascular: Vascular calcifications involving the carotid siphons.

Skull: Normal. Negative for fracture or focal lesion.

Sinuses/Orbits: No acute finding.

Other: None.
IMPRESSION: 1. Hypodensity involving the RIGHT posterior parietooccipital lobe
cortex is concerning for infarction. Recommend further dedicated
evaluation with brain MRI.

DOHOON system was activated on [DATE] at [DATE] p.m. at time of
interpretation. Addendum will be issued upon telephonic
communication of results.

## 2021-02-18 IMAGING — DX DG ABD PORTABLE 1V
1 series · 1 of 1 positions shown · non-contrast
Comparison: CT abdomen and pelvis dated [DATE]

CLINICAL DATA: Abdominal pain

EXAM:
PORTABLE ABDOMEN - 1 VIEW

[abdomen kub]
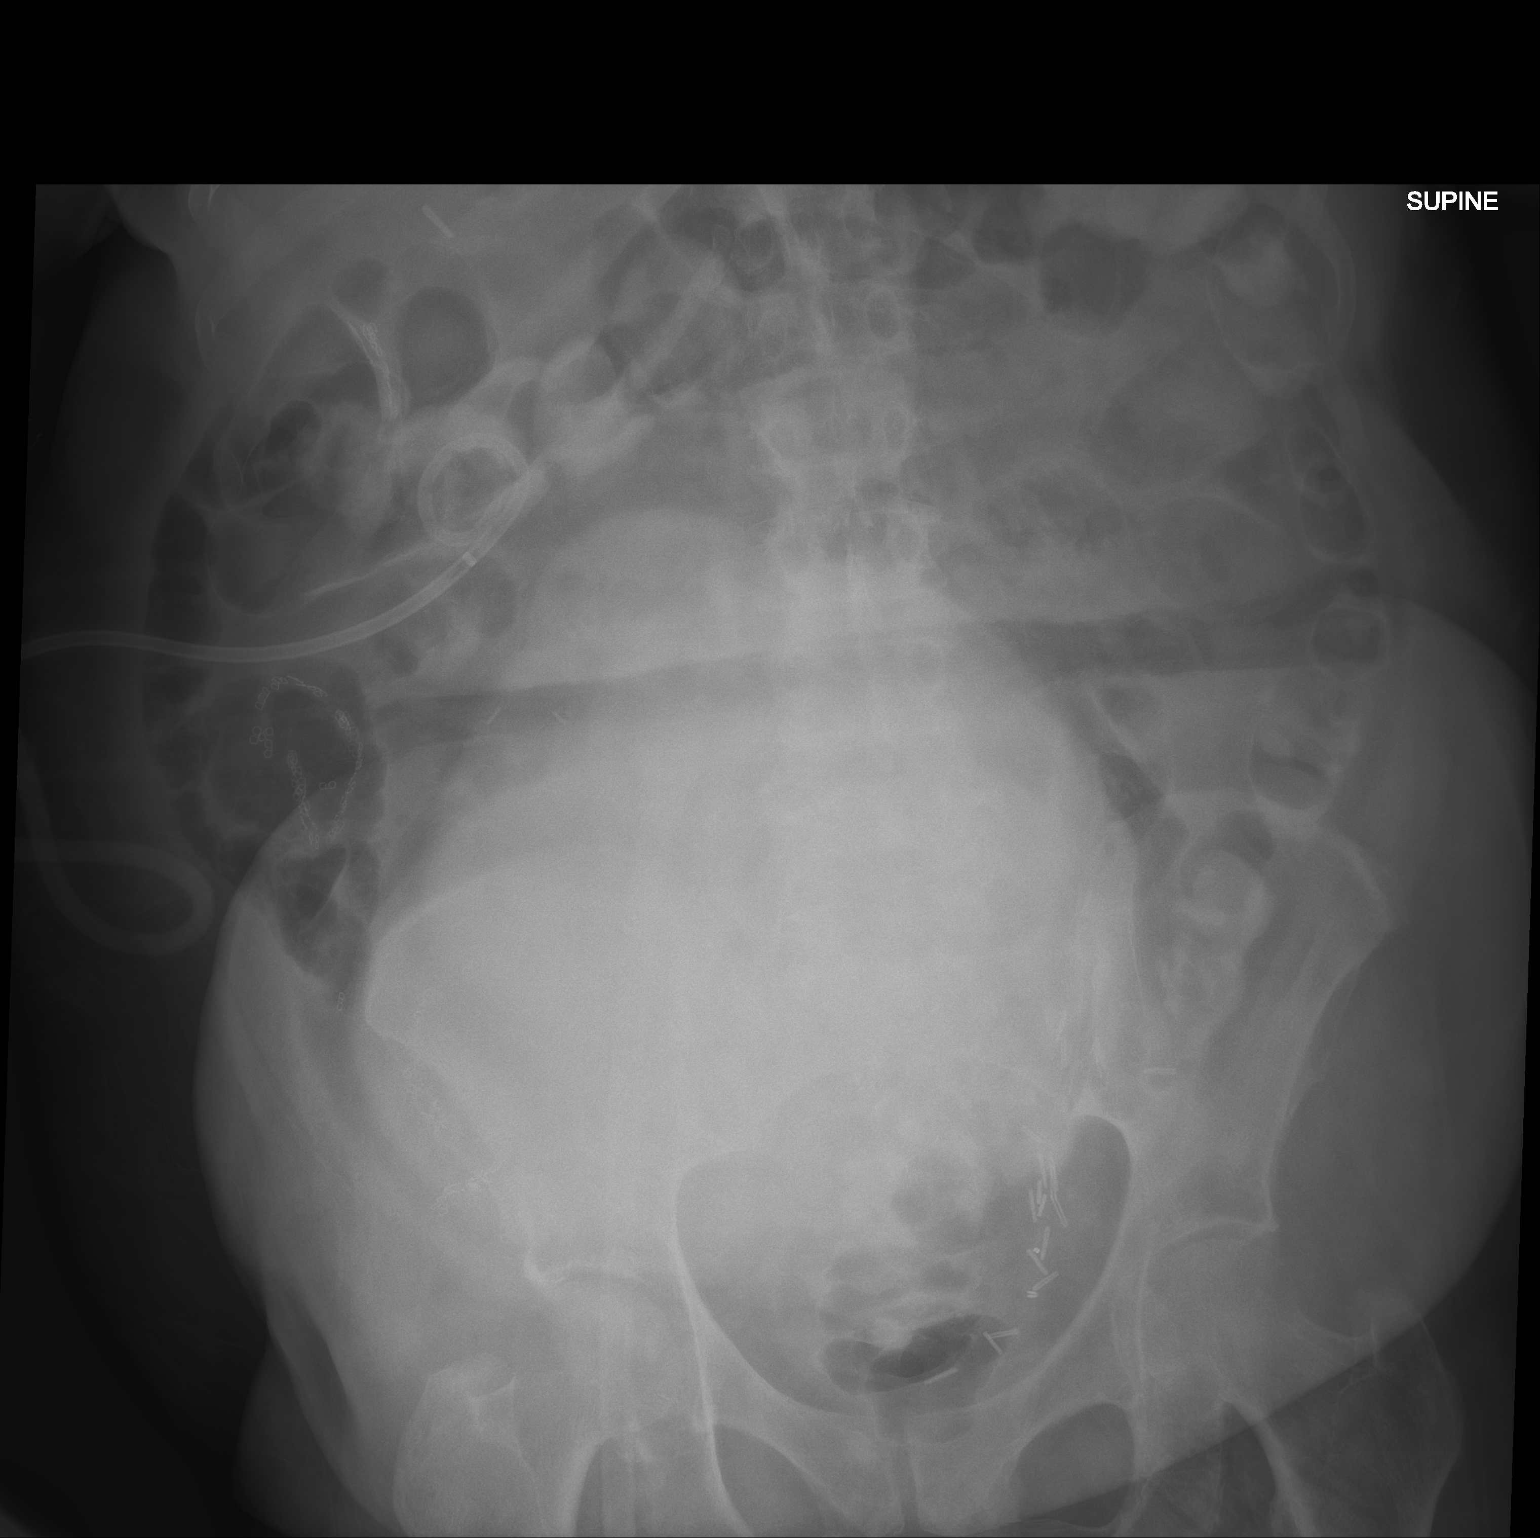

[1 of 1 positions shown; findings below may reference images not displayed]

FINDINGS: No gas-filled dilated loops of bowel. No evidence of free air,
although supine position limits evaluation. Large rounded density is
seen in the lower abdomen, measuring 21.4 x 18.0 cm, which displaces
multiple loops of bowel. Surgical drain noted in the right
hemiabdomen.
IMPRESSION: Large rounded density is seen in the lower abdomen, likely due to
distended urinary conduit. Correlate with urine output.

## 2021-02-18 IMAGING — MR MR HEAD W/O CM
10 series · 44 of 48 positions shown · non-contrast
Comparison: Prior head CT from earlier the same day.

CLINICAL DATA: Follow-up examination for acute stroke.

EXAM:
MRI HEAD WITHOUT CONTRAST
TECHNIQUE: Multiplanar, multiecho pulse sequences of the brain and surrounding
structures were obtained without intravenous contrast.

[Series 11: DWI · axial · 3.0mm · 1.36mm/px · z∈[-88,+65]mm · 7 of 104 slices shown (1 of 2)]
[im 1/104]
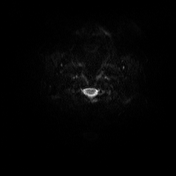
[im 18/104]
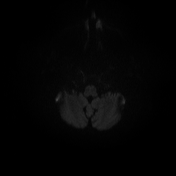
[im 35/104]
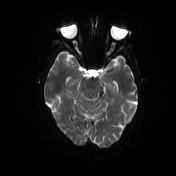
[im 52/104]
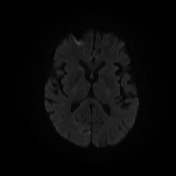
[im 69/104]
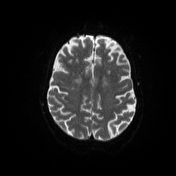
[im 86/104]
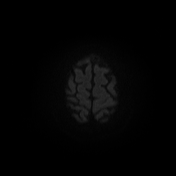
[im 104/104]
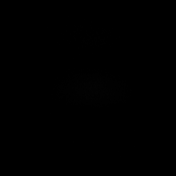

[Series 12: DWI · axial · 3.0mm · 1.36mm/px · z∈[-88,+53]mm · 4 of 48 slices shown (2 of 2)]
[im 1/48]
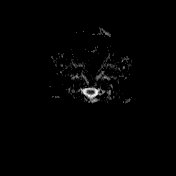
[im 16/48]
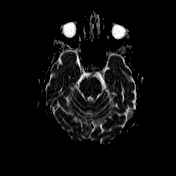
[im 32/48]
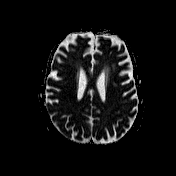
[im 48/48]
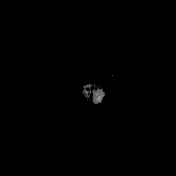

[Series 13: T1 · sagittal · 5.0mm · 0.75mm/px · 2 of 25 slices shown (1 of 2)]
[im 1/25]
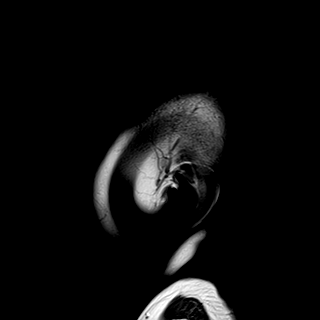
[im 25/25]
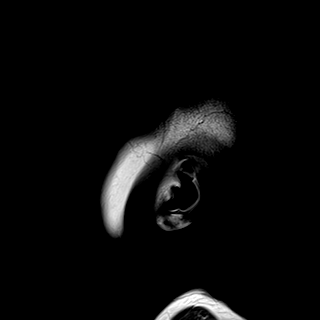

[Series 14: T2 · axial · 5.0mm · 0.62mm/px · z∈[-95,+68]mm · 2 of 26 slices shown (1 of 2)]
[im 1/26]
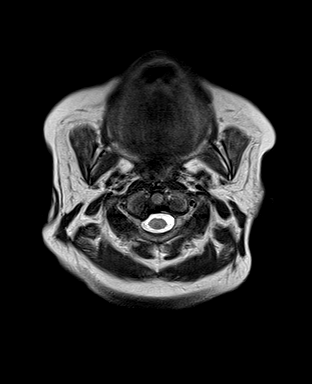
[im 26/26]
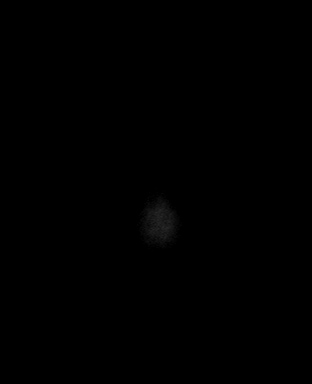

[Series 15: swi_images · axial · 3.0mm · 0.75mm/px · z∈[-120,+93]mm · 6 of 72 slices shown]
[im 1/72]
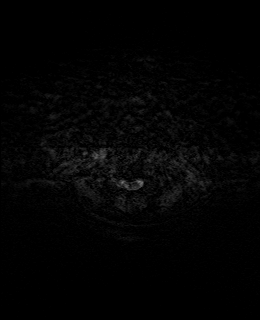
[im 15/72]
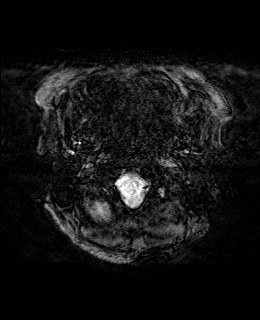
[im 29/72]
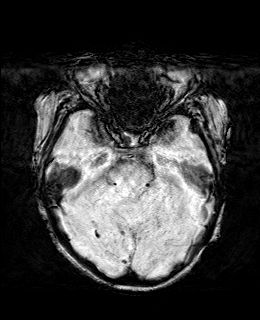
[im 43/72]
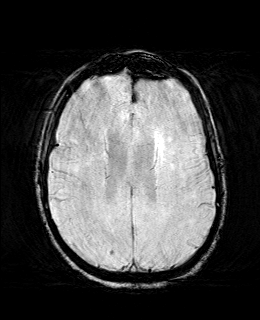
[im 57/72]
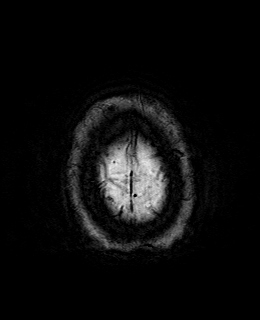
[im 72/72]
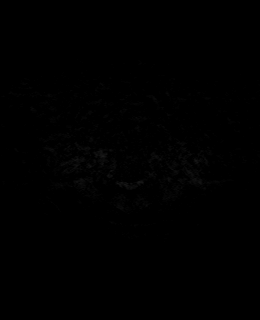

[Series 17: FLAIR · axial · 3.0mm · 0.75mm/px · z∈[-90,+63]mm · 4 of 52 slices shown]
[im 1/52]
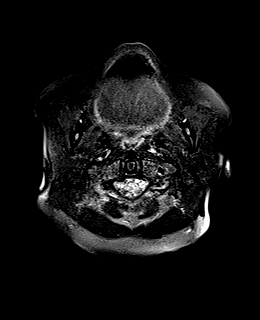
[im 18/52]
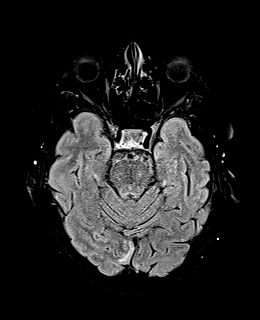
[im 35/52]
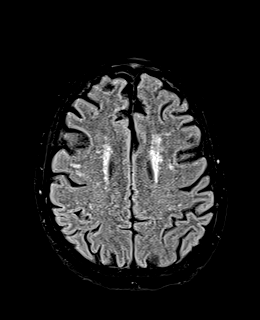
[im 52/52]
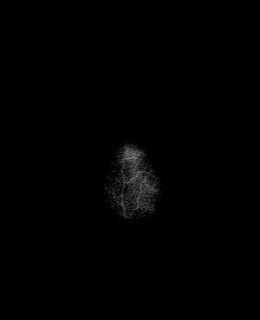

[Series 18: T1 · axial · 1.0mm · 0.47mm/px · z∈[-86,+87]mm · 10 of 176 slices shown (2 of 2)]
[im 1/176]
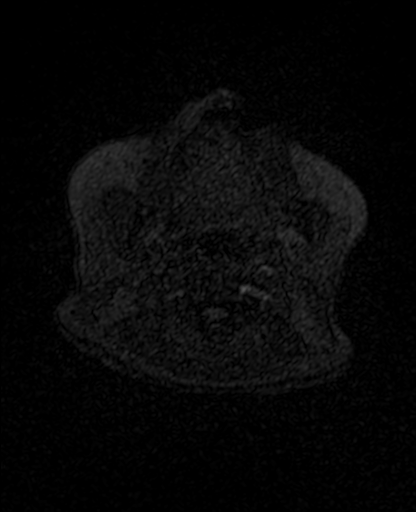
[im 14/176]
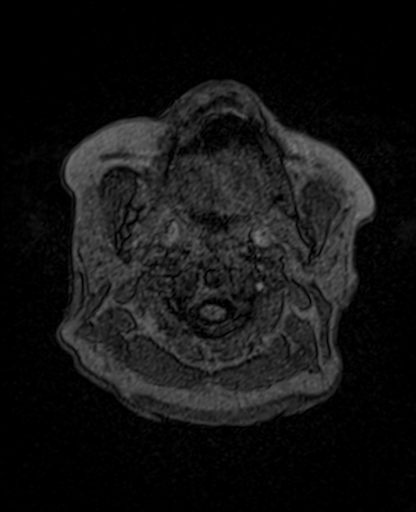
[im 27/176]
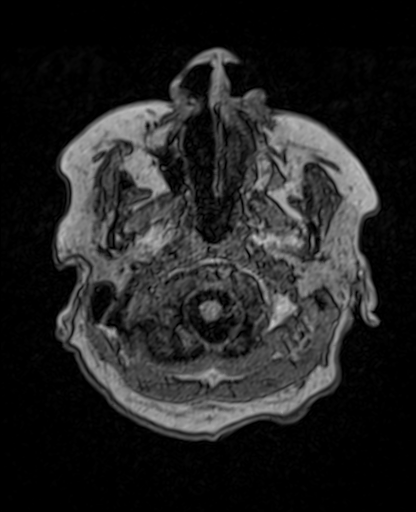
[im 41/176]
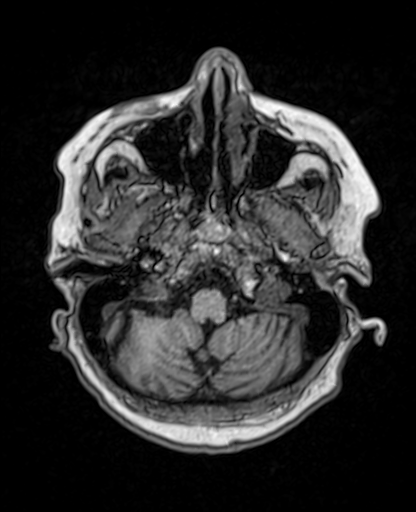
[im 54/176]
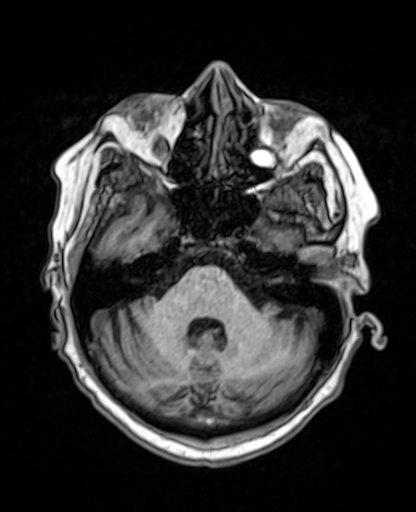
[im 81/176]
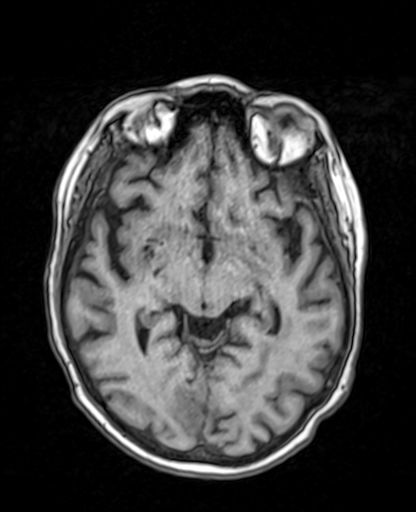
[im 95/176]
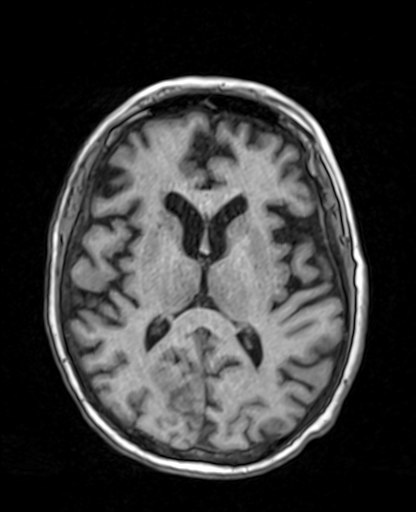
[im 122/176]
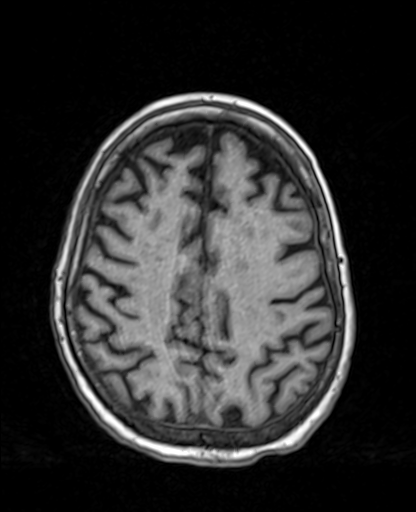
[im 149/176]
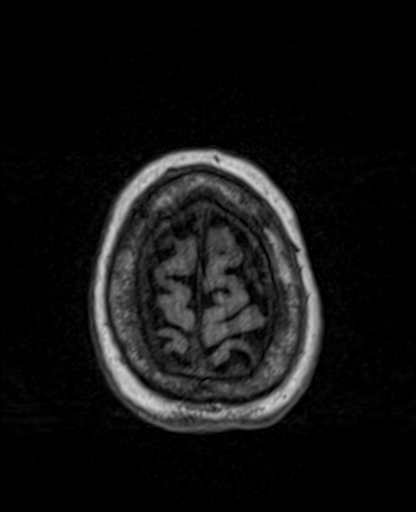
[im 176/176]
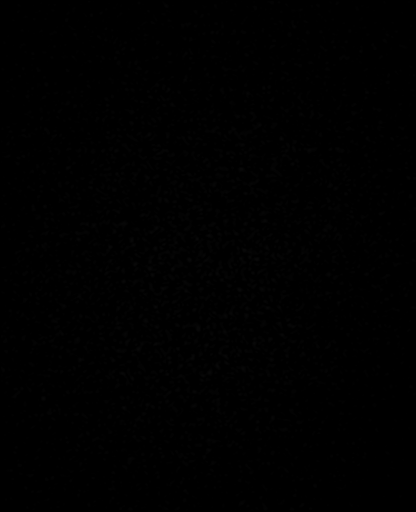

[Series 19: cor dwi_tracew · coronal · 5.0mm · 1.53mm/px · 4 of 56 slices shown]
[im 1/56]
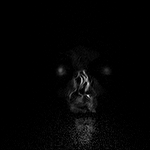
[im 19/56]
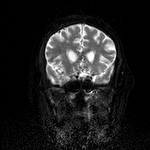
[im 37/56]
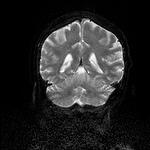
[im 56/56]
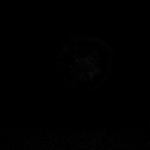

[Series 20: cor dwi_adc · coronal · 5.0mm · 1.53mm/px · 2 of 25 slices shown]
[im 1/25]
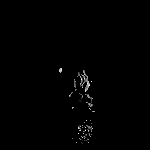
[im 25/25]
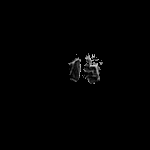

[Series 21: T2 · coronal · 5.0mm · 0.57mm/px · 3 of 37 slices shown (2 of 2)]
[im 1/37]
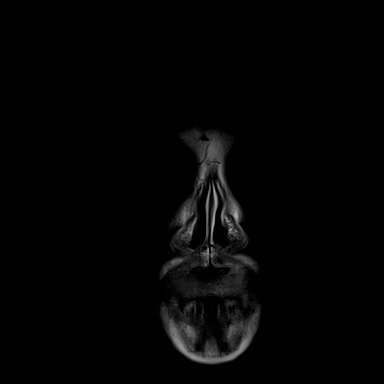
[im 19/37]
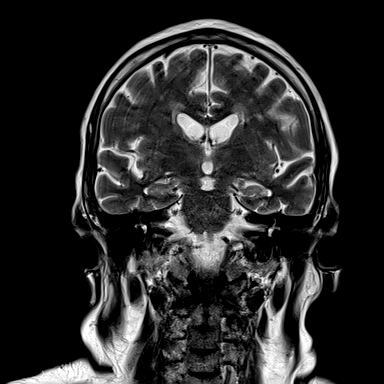
[im 37/37]
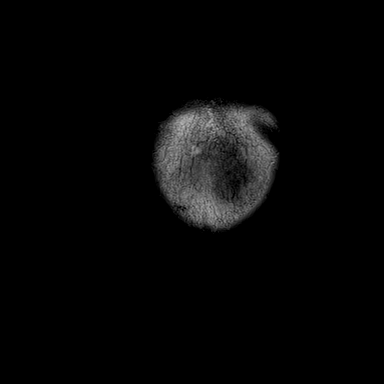

[44 of 48 positions shown; findings below may reference images not displayed]

FINDINGS: Brain: Cerebral volume within normal limits for age. Patchy T2/FLAIR
hyperintensity involving the periventricular deep white matter both
cerebral hemispheres as well as the pons, most consistent with
chronic small vessel ischemic disease, mild to moderate in nature.

Evolving right PCA territory infarct involving the parasagittal
right occipital lobe is seen, late subacute in appearance with only
mild residual diffusion abnormality seen. Associated petechial blood
products without frank intraparenchymal hematoma or significant mass
effect.

Additional 7 mm acute ischemic cortical infarct seen involving the
high right parietal lobe, postcentral gyrus (series 11, image 90).
No associated edema or hemorrhage.

Additional subtle 3 mm focus of diffusion abnormality noted at the
high parasagittal left frontal lobe, likely an evolving subacute
infarct (series 11, image 94). No associated hemorrhage or mass
effect.

No other evidence for acute or subacute ischemia. No other acute
intracranial hemorrhage. No mass lesion, mass effect or midline
shift. No hydrocephalus or extra-axial fluid collection. Pituitary
gland suprasellar region within normal limits. Midline structures
intact.

Vascular: Major intracranial vascular flow voids are maintained.

Skull and upper cervical spine: Of craniocervical junction within
normal limits. Bone marrow signal intensity normal. No scalp soft
tissue abnormality.

Sinuses/Orbits: Globes and orbital soft tissues demonstrate no acute
finding. Paranasal sinuses are largely clear. No mastoid effusion.
Inner ear structures grossly normal.

Other: None.
IMPRESSION: 1. 7 mm acute ischemic nonhemorrhagic cortical infarct involving the
high right parietal lobe, postcentral gyrus.
2. Evolving late subacute right PCA territory infarct involving the
parasagittal right occipital lobe. Associated petechial blood
products without frank intraparenchymal hematoma or significant mass
effect.
3. Additional 3 mm focus of diffusion abnormality involving the high
parasagittal left frontal lobe, likely an evolving subacute infarct.
No associated hemorrhage or mass effect.
4. Underlying mild to moderate chronic microvascular ischemic
disease.

## 2021-02-18 MED ORDER — METHOCARBAMOL 1000 MG/10ML IJ SOLN: 500.0000 mg | Freq: Four times a day (QID) | INTRAVENOUS | Status: DC | PRN

## 2021-02-18 MED ORDER — HYDROMORPHONE HCL 1 MG/ML IJ SOLN
0.5000 mg | INTRAMUSCULAR | Status: DC | PRN
  Administered 2021-02-18: 0.5 mg via INTRAVENOUS
  Filled 2021-02-18 (×2): qty 0.5

## 2021-02-18 NOTE — Progress Notes (Signed)
Laurie [name]  JZP:[id number] DOB: Jul 08, 1955 DOA: 02/06/2021 PCP: Margit Hanks, MD    Brief Narrative:  65AX w/ a hx of DM2, HTN, HLD, anxiety/depression, GERD, obesity, and multiple abdomino-pelvic surgeries who presented to the ED with 5 days of worsening lower abdominal pain associated with loose stools and vomiting. In the ED CT revealed an incarcerated ventral hernia. EDP attempted reduction though symptoms did not improve. Labs notable for acute renal failure in the setting of dehydration. She was transferred to [facility] ED for surgical consult, and repeat CT abdomen showed persistent hernia with bowel obstruction for which she was taken to the OR.    Today, pt was noted to be more altered this am, disoriented and to be in significant abdominal pain. Noted to have significant retention as pt hasnt done her self cath for an unknown period of time. VSS    Assessment & Plan: Incarcerated ventral hernia with SBO Status post repair with small bowel resection and LOA 10/31 Persisting abdominal discomfort and climbing WBC raised concern leading to CT abdomen pelvis 11/4 which noted questionable fluid collection within the abdomen which was drained by IR  Further management per General Surgery  Anterior peirtoneal abscess Afebrile, with leukocytosis Drained in IR 11/5 Continue IV Zosyn  Acute renal failure Metabolic acidosis Acute urinary retention Cr bumped up to 1.34 Possibly due to acute retention  Bladder scan >1000 , will conitnue TID In and out prn (pt reports self cath about 3 times a day due to urinary diversion) UA unremarkable for infection, UC pending  Acute metabolic encephalopathy Likely due to significant urinary retention Afebrile, with mild leukocytosis BC X 2 pending UA unremarkable, UC pending Abd xray with distended urinary conduit CXR unremarkable Monitor closely  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 Vonita [facility] at [facility]  Urology - history of cystectomy and right colon pouch for interstitial cystitis 1990s - pouch stone is found to be nonobstructing  DM2 SSI, accuchecks  HTN BP stable  COPD Stable Continue usual Dulera and as needed albuterol  GERD PPI  Obesity - Body mass index is 34.33 kg/m. Lifestyle modification advised    Significant events: 10/31 ventral hernia repair with small bowel resection and LOA 11/5 CT-guided drainage of anterior peritoneal abscess in IR -10 French drain left in place none  Consultants:  General Surgery IR  Code Status: FULL CODE  Antimicrobials:  Zosyn 11/6 >  DVT prophylaxis: Lovenox  Family Communication: Family at bedside  Status is: Inpatient -ultimate disposition will be discharge home -she has many family members and an extensive support network   Objective: Blood pressure (!) 154/79, pulse 84, temperature 98.4 F (36.9 C), temperature source Oral, resp. rate 18, height 5\' 4"  (1.626 m), weight 90.7 kg, SpO2 98 %.  Intake/Output Summary (Last 24 hours) at 02/18/2021 1555 Last data filed at 02/18/2021 1334 Gross per 24 hour  Intake --  Output 1485 ml  Net -1485 ml   Filed Weights   02/06/21 1053  Weight: 90.7 kg    Examination: General: Mild distress, disoriented Cardiovascular: S1, S2 present Respiratory: CTAB Abdomen: Soft, tender, nondistended, bowel sounds present, incision clean/dry/intact, wound vac noted Musculoskeletal: No bilateral pedal edema noted Skin: As above Psychiatry: Disoriented    CBC: Recent Labs  Lab 02/16/21 0445 02/18/21 0600 02/18/21 1204  WBC 9.4 9.9 11.3*  NEUTROABS 6.1  --  9.2*  HGB 10.2* 12.5 11.8*  HCT 32.2* 37.7 35.9*  MCV 101.3* 100.5* 98.6  PLT 362  577* 580*   Basic Metabolic Panel: Recent Labs  Lab 02/12/21 0532 02/13/21 0442 02/14/21 0504 02/15/21 0508 02/16/21 0445 02/18/21 1136  NA 145 141   < > 139 140 138  K 4.0 3.4*   < > 3.7 4.0 3.5  CL 114* 111   < > 111 112* 112*   CO2 21* 20*   < > 21* 20* 15*  GLUCOSE 153* 156*   < > 160* 141* 157*  BUN 10 10   < > 18 17 19   CREATININE 0.70 0.85   < > 1.40* 1.05* 1.34*  CALCIUM 9.2 8.9   < > 9.0 8.9 9.4  MG 1.6* 1.8  --  1.8  --  1.9  PHOS 3.4  --   --   --   --   --    < > = values in this interval not displayed.   GFR: Estimated Creatinine Clearance: 45 mL/min (A) (by C-G formula based on SCr of 1.34 mg/dL (H)).  Liver Function Tests: Recent Labs  Lab 02/12/21 0532 02/18/21 1136  AST 26 65*  ALT 23 57*  ALKPHOS 90 86  BILITOT 0.7 0.6  PROT 6.9 8.1  ALBUMIN 2.7* 3.1*  No results for input(s): LIPASE, AMYLASE in the last 168 hours.   HbA1C: Hgb A1c MFr Bld  Date/Time Value Ref Range Status  02/07/2021 05:46 AM 6.2 (H) 4.8 - 5.6 % Final    Comment:    (NOTE) Pre diabetes:          5.7%-6.4%  Diabetes:              >6.4%  Glycemic control for   <7.0% adults with diabetes   06/25/2015 05:06 AM 6.2 (H) 4.8 - 5.6 % Final    Comment:    (NOTE)         Pre-diabetes: 5.7 - 6.4         Diabetes: >6.4         Glycemic control for adults with diabetes: <7.0     CBG: Recent Labs  Lab 02/17/21 1210 02/17/21 1700 02/17/21 2309 02/18/21 0728 02/18/21 1203  GLUCAP 158* 165* 168* 162* 174*    Recent Results (from the past 240 hour(s))  MRSA Next Gen by PCR, Nasal     Status: None   Collection Time: 02/09/21  8:29 AM   Specimen: Nasal Mucosa; Nasal Swab  Result Value Ref Range Status   MRSA by PCR Next Gen NOT DETECTED NOT DETECTED Final    Comment: (NOTE) The GeneXpert MRSA Assay (FDA approved for NASAL specimens only), is one component of a comprehensive MRSA colonization surveillance program. It is not intended to diagnose MRSA infection nor to guide or monitor treatment for MRSA infections. Test performance is not FDA approved in patients less than 43 years old. Performed at [facility], [address], [place], [place]   Aerobic/Anaerobic Culture w  Gram Stain (surgical/deep wound)     Status: None   Collection Time: 02/11/21  1:56 PM   Specimen: Abscess  Result Value Ref Range Status   Specimen Description   Final    ABSCESS Performed at [facility], [address], [place], [place]    Special Requests   Final    Normal Performed at [facility], [address], [place], [place]    Gram Stain   Final    NO SQUAMOUS EPITHELIAL CELLS SEEN ABUNDANT WBC SEEN MODERATE  GRAM NEGATIVE RODS MODERATE GRAM POSITIVE COCCI    Culture   Final    ABUNDANT ESCHERICHIA COLI NO ANAEROBES ISOLATED Performed at [facility], [address] [place], [place] [address]    Report Status 02/15/2021 FINAL  Final   Organism ID, Bacteria ESCHERICHIA COLI  Final      Susceptibility   Escherichia coli - MIC*    AMPICILLIN >=32 RESISTANT Resistant     CEFAZOLIN >=64 RESISTANT Resistant     CEFEPIME <=0.12 SENSITIVE Sensitive     CEFTAZIDIME 16 INTERMEDIATE Intermediate     CEFTRIAXONE 32 RESISTANT Resistant     CIPROFLOXACIN <=0.25 SENSITIVE Sensitive     GENTAMICIN <=1 SENSITIVE Sensitive     IMIPENEM <=0.25 SENSITIVE Sensitive     TRIMETH/SULFA <=20 SENSITIVE Sensitive     AMPICILLIN/SULBACTAM >=32 RESISTANT Resistant     PIP/TAZO <=4 SENSITIVE Sensitive     * ABUNDANT ESCHERICHIA COLI      Scheduled Meds:  enoxaparin (LOVENOX) injection  40 mg Subcutaneous Q24H   feeding supplement  1 Container Oral TID BM   insulin aspart  0-5 Units Subcutaneous QHS   insulin aspart  0-9 Units Subcutaneous TID WC   melatonin  3 mg Oral QHS   methocarbamol  500 mg Oral Q6H   metoprolol succinate  50 mg Oral Daily   mometasone-formoterol  2 puff Inhalation BID   pantoprazole  40 mg Oral Daily   polycarbophil  625 mg Oral BID   polyethylene glycol  17 g Oral Daily   QUEtiapine  12.5 mg Oral QHS   saccharomyces boulardii  250 mg Oral BID   sodium chloride flush  3 mL Intravenous Q12H    sodium chloride flush  5 mL Intracatheter Daily   Continuous Infusions:  piperacillin-tazobactam (ZOSYN)  IV 3.375 g (02/18/21 1334)     LOS: 11 days   Briant Cedar, MD [facility]   If 7PM-7AM, please contact night-coverage per Amion 02/18/2021, 3:55 PM

## 2021-02-18 NOTE — Significant Event (Signed)
Rapid Response Event Note   Reason for Call :  New confusion/AMS  Initial Focused Assessment:  On arrival, patient awake in bed and grimacing. Complaining of 10/10 abdominal pain. Patient alert and answering questions, CBG 174, daughter at bedside.  Neuro- Alert to self, time and location, disoriented to situation. Grip strong and equal bilaterally, moves lower extremities on command. Pupils equal and reactive Cardiac- NSR 80s, EKG done per order from [place] MD. BP 170s, S1 and S2 auscultated, pulses 2+ equal bilaterally Respiratory- no signs of respiratory distress, clear to ascultation. RR 16-20/minute, SpO2 97% room air, ABG ordered by Magnus Ivan MD.  GI/GU- generalized abdominal tendereness noted, Wound vac and right RUQ drain noted. No redness or heat noted around sites. Patient bladder scanned w/ volume >999 mL. In and out to be competed by bedside RN  Interventions:  In and out catheter and IV pain medications as per ordered. Patient visibly  more comfortable after 0.5 mg Dilaudid IV   Plan of Care:  Pending lab results ordered by MD, monitor for increased pain and more changes in mental status.    Event Summary:   MD Notified: 1225 Call Time: 1155 Arrival Time: 1200 End Time:1235  Rosaria Ferries, RN

## 2021-02-18 NOTE — Progress Notes (Signed)
12 Days Post-Op   Subjective/Chief Complaint: Complains of pain   Objective: Vital signs in last 24 hours: Temp:  [97.4 F (36.3 C)-97.9 F (36.6 C)] 97.9 F (36.6 C) (11/12 0605) Pulse Rate:  [68-73] 73 (11/12 0605) Resp:  [12-20] 12 (11/12 0605) BP: (139-169)/(71-94) 169/85 (11/12 0605) SpO2:  [97 %-99 %] 97 % (11/12 0616) Last BM Date: 02/17/21  Intake/Output from previous day: 11/11 0701 - 11/12 0700 In: -  Out: 125 [Drains:125] Intake/Output this shift: No intake/output data recorded.  General appearance: alert and cooperative Resp: clear to auscultation bilaterally Cardio: regular rate and rhythm GI: soft, mild diffuse tenderness  Lab Results:  Recent Labs    02/16/21 0445 02/18/21 0600  WBC 9.4 9.9  HGB 10.2* 12.5  HCT 32.2* 37.7  PLT 362 577*   BMET Recent Labs    02/16/21 0445  NA 140  K 4.0  CL 112*  CO2 20*  GLUCOSE 141*  BUN 17  CREATININE 1.05*  CALCIUM 8.9   PT/INR No results for input(s): LABPROT, INR in the last 72 hours. ABG No results for input(s): PHART, HCO3 in the last 72 hours.  Invalid input(s): PCO2, PO2  Studies/Results: CT ABDOMEN PELVIS W CONTRAST  Result Date: 02/16/2021 CLINICAL DATA:  Abdominal abscess, infection suspected EXAM: CT ABDOMEN AND PELVIS WITH CONTRAST TECHNIQUE: Multidetector CT imaging of the abdomen and pelvis was performed using the standard protocol following bolus administration of intravenous contrast. CONTRAST:  82mL OMNIPAQUE IOHEXOL 350 MG/ML SOLN COMPARISON:  CT abdomen and pelvis 02/10/2021 FINDINGS: Lower chest: Elevated right hemidiaphragm and subsegmental atelectatic changes in the right lung base. Hepatobiliary: No focal liver abnormality is seen. Status post cholecystectomy. No biliary dilatation. Pancreas: Unremarkable. No pancreatic ductal dilatation or surrounding inflammatory changes. Spleen: Normal in size without focal abnormality. Adrenals/Urinary Tract: Bilateral adrenal thickening and  an 11 mm nodular density in the left adrenal gland, unchanged. Kidneys appear stable and within normal limits. Urinary bladder is surgically absent. 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 Associated right abdominal urinary ostomy. Stomach/Bowel: No bowel obstruction, free air or pneumatosis identified. Colonic diverticulosis without evidence of acute diverticulitis. Mild wall thickening of loops of small bowel in the anterior mid abdomen adjacent to previous surgical changes. Vascular/Lymphatic: Aortic atherosclerosis. No enlarged abdominal or pelvic lymph nodes. Reproductive: Status post hysterectomy. No adnexal masses. Other: Redemonstration of surgical changes from recent umbilical hernia repair. Interval placement of a percutaneous drainage catheter in the anterior abdomen with essentially complete resolution of the previous fluid collection. A few small foci of residual air identified in the region. Similar appearing mild fat stranding about the umbilicus no new fluid collection identified. No frank ascites. Musculoskeletal: No suspicious bony lesions identified. IMPRESSION: 1. Recent umbilical hernia surgical repair changes again seen. Interval resolution of the previous fluid collection in the anterior abdomen status post percutaneous drainage catheter placement. A few small foci of residual air in the region. Stable appearing soft tissue fat stranding about the umbilicus with no new collection identified. 2. Previous cystectomy and urinary diversion changes, unchanged. 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 3. Colonic diverticulosis. 4. Other findings as described. Electronically Signed   By: Jannifer Hick M.D.   On: 02/16/2021 13:57    Anti-infectives: Anti-infectives (From admission, onward)    Start     Dose/Rate Route Frequency Ordered Stop   02/12/21 1200  piperacillin-tazobactam  (ZOSYN) IVPB  3.375 g        3.375 g 12.5 mL/hr over 240 Minutes Intravenous Every 8 hours 02/12/21 0950     02/06/21 2003  ceFAZolin (ANCEF) 2-4 GM/100ML-% IVPB       Note to Pharmacy: Ponciano Ort   : cabinet override      02/06/21 2003 02/06/21 2046   02/06/21 1945  ceFAZolin (ANCEF) IVPB 2g/100 mL premix        2 g 200 mL/hr over 30 Minutes Intravenous On call to O.R. 02/06/21 1914 02/06/21 2028       Assessment/Plan: s/p Procedure(s): EXPLORATORY LAPAROTOMY; LYSIS OF ADHESIONS; INCISIONAL HERNIA REPAIR,SMALL BOWEL RESECTION (N/A) Advance diet Ventral hernia with SBO POD#12 S/P ventral hernia repair with small bowel resection, LOA 10/31 Dr. Sheliah Hatch - wound vac MWF - will need vac at home, order placed.  - s/p IR drain 11/5, culture with somewhat resistant E coli but sensitive to zosyn - continue iv zosyn during admission and plan 2 weeks augmentin at discharge - continue drain and monitor output - PT/OT, encourage mobilization - recommending HH PT - encourage PO intake and continue bowel regimen - CT 11/10 showed resolution of drained fluid collection with drain in place and a few dots of residual air, no new concerning findings. No definite enterocutaneous fistula but is high risk. Keep drain in place, will need follow up in IR with drain injection prior to considering removal. - Will assess wound later today during vac change with nurse. If stable and she is doing well may be able to go home with drain and PO antibiotics this afternoon versus over the weekend   FEN: reg diet VTE: lovenox ID: Ancef pre-op, zosyn 11/6>>day#6   Hx of cystectomy with urostomy in place - large stone noted in ileal conduit on CT, OP follow up with [facility] recommended  AKI on CKD - Cr improving T2DM with gastroparesis - SSI HTN Chronic candidal esophagitis  GERD Hx of Hep C Glaucoma  B12 deficiency and iron deficiency anemia  CAD Hx of asthma   LOS: 11 days    Laurie [name]  III 02/18/2021

## 2021-02-18 NOTE — Progress Notes (Signed)
PT Cancellation Note  Patient Details Name: Laurie [name] MRN: [id number] DOB: [birth year]   Cancelled Treatment:    Reason Eval/Treat Not Completed: Medical issues which prohibited therapy (RN request hold. Pt with increased pain levels and new AMS today, awaiting imaging/labs. PT will follow up as schedule allows and pt appropriate for participation.)   Lyman Speller., PT, DPT  Acute Rehabilitation Services  Office [phone]  02/18/2021, 12:41 PM

## 2021-02-19 ENCOUNTER — Inpatient Hospital Stay (HOSPITAL_COMMUNITY): Payer: Medicare PPO

## 2021-02-19 DIAGNOSIS — I6389 Other cerebral infarction: Secondary | ICD-10-CM

## 2021-02-19 LAB — ECHOCARDIOGRAM COMPLETE BUBBLE STUDY
AR max vel: 2.2 cm2
AV Area VTI: 1.96 cm2
AV Area mean vel: 1.95 cm2
AV Mean grad: 6 mmHg
AV Peak grad: 10.9 mmHg
Ao pk vel: 1.65 m/s
Area-P 1/2: 3.48 cm2
S' Lateral: 2.5 cm

## 2021-02-19 LAB — CBC WITH DIFFERENTIAL/PLATELET
Abs Immature Granulocytes: 0.16 10*3/uL — ABNORMAL HIGH (ref 0.00–0.07)
Basophils Absolute: 0.1 10*3/uL (ref 0.0–0.1)
Basophils Relative: 1 %
Eosinophils Absolute: 0.1 10*3/uL (ref 0.0–0.5)
Eosinophils Relative: 1 %
HCT: 33.3 % — ABNORMAL LOW (ref 36.0–46.0)
Hemoglobin: 10.8 g/dL — ABNORMAL LOW (ref 12.0–15.0)
Immature Granulocytes: 2 %
Lymphocytes Relative: 18 %
Lymphs Abs: 2 10*3/uL (ref 0.7–4.0)
MCH: 32.2 pg (ref 26.0–34.0)
MCHC: 32.4 g/dL (ref 30.0–36.0)
MCV: 99.4 fL (ref 80.0–100.0)
Monocytes Absolute: 1.2 10*3/uL — ABNORMAL HIGH (ref 0.1–1.0)
Monocytes Relative: 11 %
Neutro Abs: 7.3 10*3/uL (ref 1.7–7.7)
Neutrophils Relative %: 67 %
Platelets: 548 10*3/uL — ABNORMAL HIGH (ref 150–400)
RBC: 3.35 MIL/uL — ABNORMAL LOW (ref 3.87–5.11)
RDW: 14.4 % (ref 11.5–15.5)
WBC: 10.9 10*3/uL — ABNORMAL HIGH (ref 4.0–10.5)
nRBC: 0 % (ref 0.0–0.2)

## 2021-02-19 LAB — GLUCOSE, CAPILLARY
Glucose-Capillary: 124 mg/dL — ABNORMAL HIGH (ref 70–99)
Glucose-Capillary: 128 mg/dL — ABNORMAL HIGH (ref 70–99)
Glucose-Capillary: 129 mg/dL — ABNORMAL HIGH (ref 70–99)
Glucose-Capillary: 148 mg/dL — ABNORMAL HIGH (ref 70–99)
Glucose-Capillary: 83 mg/dL (ref 70–99)

## 2021-02-19 LAB — LIPID PANEL
Cholesterol: 161 mg/dL (ref 0–200)
HDL: 30 mg/dL — ABNORMAL LOW (ref >40)
LDL Cholesterol: 97 mg/dL (ref 0–99)
Total CHOL/HDL Ratio: 5.4 RATIO
Triglycerides: 170 mg/dL — ABNORMAL HIGH (ref <150)
VLDL: 34 mg/dL (ref 0–40)

## 2021-02-19 LAB — COMPREHENSIVE METABOLIC PANEL
ALT: 68 U/L — ABNORMAL HIGH (ref 0–44)
AST: 78 U/L — ABNORMAL HIGH (ref 15–41)
Albumin: 2.8 g/dL — ABNORMAL LOW (ref 3.5–5.0)
Alkaline Phosphatase: 75 U/L (ref 38–126)
Anion gap: 11 (ref 5–15)
BUN: 20 mg/dL (ref 8–23)
CO2: 17 mmol/L — ABNORMAL LOW (ref 22–32)
Calcium: 9.3 mg/dL (ref 8.9–10.3)
Chloride: 114 mmol/L — ABNORMAL HIGH (ref 98–111)
Creatinine, Ser: 1.32 mg/dL — ABNORMAL HIGH (ref 0.44–1.00)
GFR, Estimated: 45 mL/min — ABNORMAL LOW (ref >60)
Glucose, Bld: 128 mg/dL — ABNORMAL HIGH (ref 70–99)
Potassium: 3.3 mmol/L — ABNORMAL LOW (ref 3.5–5.1)
Sodium: 142 mmol/L (ref 135–145)
Total Bilirubin: 0.6 mg/dL (ref 0.3–1.2)
Total Protein: 7.4 g/dL (ref 6.5–8.1)

## 2021-02-19 MED ORDER — ROSUVASTATIN CALCIUM 10 MG PO TABS
10.0000 mg | ORAL_TABLET | Freq: Every day | ORAL | Status: DC
  Administered 2021-02-19 – 2021-02-27 (×8): 10 mg via ORAL
  Filled 2021-02-19 (×8): qty 1

## 2021-02-19 MED ORDER — SODIUM CHLORIDE 0.9 % IV SOLN: INTRAVENOUS | Status: DC

## 2021-02-19 MED ORDER — ASPIRIN EC 81 MG PO TBEC
81.0000 mg | DELAYED_RELEASE_TABLET | Freq: Every day | ORAL | Status: DC
  Administered 2021-02-19 – 2021-02-27 (×8): 81 mg via ORAL
  Filled 2021-02-19 (×8): qty 1

## 2021-02-19 MED ORDER — POTASSIUM CHLORIDE CRYS ER 20 MEQ PO TBCR
40.0000 meq | EXTENDED_RELEASE_TABLET | Freq: Once | ORAL | Status: AC
  Administered 2021-02-19: 40 meq via ORAL
  Filled 2021-02-19: qty 2

## 2021-02-19 NOTE — Progress Notes (Signed)
Laurie [name]  [id number] DOB: [birth year] DOA: 02/06/2021 PCP: Margit Hanks, MD    Brief Narrative:  32KG w/ a hx of DM2, HTN, HLD, anxiety/depression, GERD, obesity, and multiple abdomino-pelvic surgeries who presented to the ED with 5 days of worsening lower abdominal pain associated with loose stools and vomiting. In the ED CT revealed an incarcerated ventral hernia. EDP attempted reduction though symptoms did not improve. Labs notable for acute renal failure in the setting of dehydration. She was transferred to [facility] ED for surgical consult, and repeat CT abdomen showed persistent hernia with bowel obstruction for which she was taken to the OR.    Overnight, MRI showed acute CVA, teleneuro was consulted. Today, pt is more oriented, no obvious focal neurologic deficit. Continues to c/o abdominal pain, but somewhat looks comfortable. Denies any chest pain, SOB, N/V, fever/chills.    Assessment & Plan: Incarcerated ventral hernia with SBO Status post repair with small bowel resection and LOA 10/31 Persisting abdominal discomfort and climbing WBC raised concern leading to CT abdomen pelvis 11/4 which noted questionable fluid collection within the abdomen which was drained by IR  Further management per General Surgery  Anterior peirtoneal abscess Afebrile, with leukocytosis Drained in IR 11/5 Continue IV Zosyn  Acute renal failure Metabolic acidosis Acute urinary retention Cr bumped up to 1.34 Possibly due to acute retention  Bladder scan >1000 , [name] conitnue TID In and out prn (pt reports self cath about 3 times a day due to urinary diversion) UA unremarkable for infection, UC pending  Hypokalemia Replace prn Daily CMP  Acute CVA MRI showed acute ischemic nonhemorrhagic cortical infarct cortical infarct, evolving late subacute right PCA territory infarct involving the right occipital lobe, associated petechial blood products without frank intraparenchymal  hematoma or significant mass-effect MRA head and neck pending Echo with EF 65 to 70%, grade 1 diastolic dysfunction, bubble study negative with no evidence of inter-atrial shunt LDL, 97, A1c--> 6.2 Start ASA PT/OT Telemetry Neurology consulted  Acute metabolic encephalopathy Likely due to CVA as above Afebrile, with mild leukocytosis BC X 2 NGTD UA unremarkable, UC pending Abd xray with distended urinary conduit CXR unremarkable Monitor closely  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 Vonita Moss at [facility] - history of cystectomy and right colon pouch for interstitial cystitis 1990s - pouch stone is found to be nonobstructing  DM2 SSI, accuchecks  HTN BP stable  COPD Stable Continue usual Dulera and as needed albuterol  GERD PPI  Obesity - Body mass index is 34.33 kg/m. Lifestyle modification advised    Significant events: 10/31 ventral hernia repair with small bowel resection and LOA 11/5 CT-guided drainage of anterior peritoneal abscess in IR -10 French drain left in place none  Consultants:  General Surgery IR Neurology  Code Status: FULL CODE  Antimicrobials:  Zosyn 11/6 >  DVT prophylaxis: Lovenox  Family Communication: Family at bedside, discussed with sister  Status is: Inpatient -ultimate disposition [name] be discharge home -she has many family members and an extensive support network   Objective: Blood pressure (!) 163/81, pulse 76, temperature 98.7 F (37.1 C), resp. rate 19, height 5\' 4"  (1.626 m), weight 90.7 kg, SpO2 100 %.  Intake/Output Summary (Last 24 hours) at 02/19/2021 1724 Last data filed at 02/19/2021 0626 Gross per 24 hour  Intake 150.46 ml  Output 25 ml  Net 125.46 ml   Filed Weights   02/06/21 1053  Weight: 90.7 kg    Examination: General: NAD Cardiovascular:  S1, S2 present Respiratory: CTAB Abdomen: Soft, tender, nondistended, bowel sounds present, incision clean/dry/intact, wound  vac noted Musculoskeletal: No bilateral pedal edema noted Skin: As above Psychiatry: Normal mood Neurology: Strength equal in extremities, sensation intact, no obvious focal neurologic deficits   CBC: Recent Labs  Lab 02/16/21 0445 02/18/21 0600 02/18/21 1204 02/19/21 0144  WBC 9.4 9.9 11.3* 10.9*  NEUTROABS 6.1  --  9.2* 7.3  HGB 10.2* 12.5 11.8* 10.8*  HCT 32.2* 37.7 35.9* 33.3*  MCV 101.3* 100.5* 98.6 99.4  PLT 362 577* 580* [id number]*   Basic Metabolic Panel: Recent Labs  Lab 02/13/21 0442 02/14/21 0504 02/15/21 0508 02/16/21 0445 02/18/21 1136 02/19/21 0144  NA 141   < > 139 140 138 142  K 3.4*   < > 3.7 4.0 3.5 3.3*  CL 111   < > 111 112* 112* 114*  CO2 20*   < > 21* 20* 15* 17*  GLUCOSE 156*   < > 160* 141* 157* 128*  BUN 10   < > 18 17 19 20   CREATININE 0.85   < > 1.40* 1.05* 1.34* 1.32*  CALCIUM 8.9   < > 9.0 8.9 9.4 9.3  MG 1.8  --  1.8  --  1.9  --    < > = values in this interval not displayed.   GFR: Estimated Creatinine Clearance: 45.7 mL/min (A) (by C-G formula based on SCr of 1.32 mg/dL (H)).  Liver Function Tests: Recent Labs  Lab 02/18/21 1136 02/19/21 0144  AST 65* 78*  ALT 57* 68*  ALKPHOS 86 75  BILITOT 0.6 0.6  PROT 8.1 7.4  ALBUMIN 3.1* 2.8*  No results for input(s): LIPASE, AMYLASE in the last 168 hours.   HbA1C: Hgb A1c MFr Bld  Date/Time Value Ref Range Status  02/07/2021 05:46 AM 6.2 (H) 4.8 - 5.6 % Final    Comment:    (NOTE) Pre diabetes:          5.7%-6.4%  Diabetes:              >6.4%  Glycemic control for   <7.0% adults with diabetes   06/25/2015 05:06 AM 6.2 (H) 4.8 - 5.6 % Final    Comment:    (NOTE)         Pre-diabetes: 5.7 - 6.4         Diabetes: >6.4         Glycemic control for adults with diabetes: <7.0     CBG: Recent Labs  Lab 02/18/21 1947 02/19/21 0044 02/19/21 0753 02/19/21 1149 02/19/21 1553  GLUCAP 127* 129* 128* 124* 148*    Recent Results (from the past 240 hour(s))   Aerobic/Anaerobic Culture w Gram Stain (surgical/deep wound)     Status: None   Collection Time: 02/11/21  1:56 PM   Specimen: Abscess  Result Value Ref Range Status   Specimen Description   Final    ABSCESS Performed at [facility] [address] [place], [place] [address]    Special Requests   Final    Normal Performed at [facility], [place] [address] [place], [place] [address]    Gram Stain   Final    NO SQUAMOUS EPITHELIAL CELLS SEEN ABUNDANT WBC SEEN MODERATE GRAM NEGATIVE RODS MODERATE GRAM POSITIVE COCCI    Culture   Final    ABUNDANT ESCHERICHIA COLI NO ANAEROBES ISOLATED Performed at [facility], [address] [place], [place] [address]  Report Status 02/15/2021 FINAL  Final   Organism ID, Bacteria ESCHERICHIA COLI  Final      Susceptibility   Escherichia coli - MIC*    AMPICILLIN >=32 RESISTANT Resistant     CEFAZOLIN >=64 RESISTANT Resistant     CEFEPIME <=0.12 SENSITIVE Sensitive     CEFTAZIDIME 16 INTERMEDIATE Intermediate     CEFTRIAXONE 32 RESISTANT Resistant     CIPROFLOXACIN <=0.25 SENSITIVE Sensitive     GENTAMICIN <=1 SENSITIVE Sensitive     IMIPENEM <=0.25 SENSITIVE Sensitive     TRIMETH/SULFA <=20 SENSITIVE Sensitive     AMPICILLIN/SULBACTAM >=32 RESISTANT Resistant     PIP/TAZO <=4 SENSITIVE Sensitive     * ABUNDANT ESCHERICHIA COLI  Culture, blood (routine x 2)     Status: None (Preliminary result)   Collection Time: 02/18/21 12:04 PM   Specimen: BLOOD  Result Value Ref Range Status   Specimen Description   Final    BLOOD RIGHT ANTECUBITAL Performed at [facility] [address], [place], [place] [address]    Special Requests   Final    BOTTLES DRAWN AEROBIC ONLY Blood Culture adequate volume Performed at [facility] [address] [place], [place] [address]    Culture   Final    NO GROWTH < 24 HOURS Performed at [facility]  [address], [place], [place] [address]    Report Status PENDING  Incomplete  Culture, blood (routine x 2)     Status: None (Preliminary result)   Collection Time: 02/18/21 12:04 PM   Specimen: BLOOD LEFT HAND  Result Value Ref Range Status   Specimen Description   Final    BLOOD LEFT HAND Performed at [facility] [address] [place], [place] [address]    Special Requests   Final    BOTTLES DRAWN AEROBIC ONLY Blood Culture results may not be optimal due to an inadequate volume of blood received in culture bottles Performed at [facility] [address], [place], [place] [address]    Culture   Final    NO GROWTH < 24 HOURS Performed at [facility] [address] [place], [place] [address]    Report Status PENDING  Incomplete      Scheduled Meds:  enoxaparin (LOVENOX) injection  40 mg Subcutaneous Q24H   feeding supplement  1 Container Oral TID BM   insulin aspart  0-5 Units Subcutaneous QHS   insulin aspart  0-9 Units Subcutaneous TID WC   melatonin  3 mg Oral QHS   methocarbamol  500 mg Oral Q6H   metoprolol succinate  50 mg Oral Daily   mometasone-formoterol  2 puff Inhalation BID   pantoprazole  40 mg Oral Daily   polycarbophil  625 mg Oral BID   polyethylene glycol  17 g Oral Daily   QUEtiapine  12.5 mg Oral QHS   saccharomyces boulardii  250 mg Oral BID   sodium chloride flush  3 mL Intravenous Q12H   sodium chloride flush  5 mL Intracatheter Daily   Continuous Infusions:  piperacillin-tazobactam (ZOSYN)  IV 3.375 g (02/19/21 1300)     LOS: 12 days   Alma Friendly, MD [facility]   If 7PM-7AM, please contact night-coverage per Amion 02/19/2021, 5:24 PM

## 2021-02-19 NOTE — Progress Notes (Signed)
Patient complained of feeling full in her abdomen. Bladder scan done and showed 135 ml of urine. Will continue to reassess.

## 2021-02-19 NOTE — Progress Notes (Signed)
Called by radiology after CT scan of head performed. Hypodensity involving the right posterior parietooccipital lobe cortex is concerning for infarction. Recommended further dedicated evaluation with brain MRI. MRI of brain showing multiple small areas of infarct both subacute and acute. Neurology consulted for further evaluation,  CVA - EKG - Lipid panel - Aspirin when ok with surgery - Echocardiogram  - Continue with lovenox - Neurology consulted. Pt seen by neuro telehealth. - MR Angio of head w/o contrast

## 2021-02-19 NOTE — Consult Note (Signed)
[facility] TeleNeurology Consult Services  Stat Consult  Patient Name:   Laurie [name], Laurie [name] Date of Birth:   02-01-1956 Identification Number:   MRN - [id number] Date of Service:   02/19/2021 00:10:19  Diagnosis:       I63.9 - Cerebrovascular accident (CVA), unspecified mechanism ([place])  Impression 65 yo RH F with h/o asthma, B12 deficiency, CKD, CAD, diabetes, GERD, prior GIB, glaucoma, hep C, HTN, IBS, currently admitted since 02/06/21 with incarcerated ventral hernia s/p surgery. Neuroimaging for encephalopathy showed R PCA late subacute stroke with petechial hemorrhage and acute punctate cortical ischemic strokes in both hemispheres suggestive of an embolic source. Recommend completion of stroke workup. Lipids, A1c, vessel imaging (probably MRA head/neck WO contrast given poor kidney function), TTE ideally with bubble study, telemetry; if unrevealing, then consider TEE; if no clear cause while inpatient, consider outpatient cardiac monitoring. ASA when safe from surgical perspective. Discussed with primary team on video at bedside.  Our recommendations are outlined below.  Diagnostic Studies: Transthoracic Echo with bubble study, if available  Laboratory Studies: Recommend Lipid panel Hemoglobin A1c  Medications: Initiate Aspirin 81 mg daily when safe from surgical perspective  Nursing Recommendations: Telemetry, IV Fluids, avoid dextrose containing fluids, Maintain euglycemia Neuro checks q4 hrs x 24 hrs and then per shift Head of bed 30 degrees  Consultations: Recommend Speech therapy if failed dysphagia screen Physical therapy/Occupational therapy  DVT Prophylaxis: Choice of Primary Team  Disposition: Neurology will follow  Diagnostic Studies Free Text: MRA head/neck without contrast (if MRA neck without contrast is poor quality then get carotid ultrasound) Telemetry monitoring   Metrics: [facility] Notification Time: 02/19/2021  00:07:53 Stamp Time: 02/19/2021 00:10:19 Callback Response Time: 02/19/2021 00:11:11   ----------------------------------------------------------------------------------------------------  Chief Complaint: stroke on MRI  History of Present Illness: Patient is a 65 year old Female.  65 yo RH F with h/o asthma, B12 deficiency, CKD, CAD, diabetes, GERD, prior GIB, glaucoma, hep C, HTN, IBS, currently admitted since 02/06/21 with abdominal pain. Found to have incarcerated ventral hernia for which she had surgery that day. Neurologically, she has been encephalopathic during her hospital course. Mental status worsened yesterday and a CT head was obtained which showed R PCA area hypodensity. MRI was obtained to follow this up; the R PCA area is not new (read as late subacute with some blood products) and there are new tiny punctate cortical ischemic strokes in both hemispheres.          Examination: BP(141/65), Pulse(72), Blood Glucose(129) 1A: Level of Consciousness - Alert; keenly responsive + 0 1B: Ask Month and Age - Both Questions Right + 0 1C: Blink Eyes & Squeeze Hands - Performs Both Tasks + 0 2: Test Horizontal Extraocular Movements - Normal + 0 3: Test Visual Fields - No Visual Loss + 0 4: Test Facial Palsy (Use Grimace if Obtunded) - Normal symmetry + 0 5A: Test Left Arm Motor Drift - No Drift for 10 Seconds + 0 5B: Test Right Arm Motor Drift - No Drift for 10 Seconds + 0 6A: Test Left Leg Motor Drift - Drift, but doesn't hit bed + 1 6B: Test Right Leg Motor Drift - Drift, but doesn't hit bed + 1 7: Test Limb Ataxia (FNF/Heel-Shin) - No Ataxia + 0 8: Test Sensation - Normal; No sensory loss + 0 9: Test Language/Aphasia - Normal; No aphasia + 0 10: Test Dysarthria - Normal + 0 11: Test Extinction/Inattention - No abnormality + 0  NIHSS Score: 2 NIHSS Free Text : Couple  of myoclonic jerks of arms     Patient / Family was informed the Neurology Consult would occur via  TeleHealth consult by way of interactive audio and video telecommunications and consented to receiving care in this manner.  Patient is being evaluated for possible acute neurologic impairment and high probability of imminent or life - threatening deterioration.I spent total of 30 minutes providing care to this patient, including time for face to face visit via telemedicine, review of medical records, imaging studies and discussion of findings with providers, the patient and / or family.   Dr Coralie Carpen   [facility] [phone]  Case [id number]

## 2021-02-19 NOTE — Progress Notes (Signed)
Rapid Response Event Note   Reason for Call : pt shows a stroke on MRI   Initial Focused Assessment:  pt resting comfortably.  A/O to self and place.  Follows commands.  Speaking ok.  See flow sheet for VS.  Tele Neuro to bedside along with [facility], NP for evaluation.     Interventions: CBG, and EKG complete.  Stroke scale complete.  New orders received and initiated.  Plan of Care:  continue to monitor placed on tele monitor.   Event Summary:   MD Notified: yes Call Time: 1226 Arrival Time: 1227 End Time: 1:15  Conley Rolls, RN

## 2021-02-19 NOTE — Progress Notes (Signed)
13 Days Post-Op   Subjective/Chief Complaint: Complains of pain but seems comfortable   Objective: Vital signs in last 24 hours: Temp:  [97.9 F (36.6 C)-98.6 F (37 C)] 98.6 F (37 C) (11/13 0540) Pulse Rate:  [72-91] 84 (11/13 0540) Resp:  [16-20] 16 (11/13 0540) BP: (141-176)/(65-82) 176/69 (11/13 0540) SpO2:  [97 %-100 %] 97 % (11/13 0716) Last BM Date: 02/17/21  Intake/Output from previous day: 11/12 0701 - 11/13 0700 In: 150.5 [I.V.:150.5] Out: 1385 [Urine:1360; Drains:25] Intake/Output this shift: No intake/output data recorded.  General appearance: alert and cooperative Resp: clear to auscultation bilaterally Cardio: regular rate and rhythm GI: soft, mild diffuse tenderness. Drain output purulent  Lab Results:  Recent Labs    02/18/21 1204 02/19/21 0144  WBC 11.3* 10.9*  HGB 11.8* 10.8*  HCT 35.9* 33.3*  PLT 580* 548*   BMET Recent Labs    02/18/21 1136 02/19/21 0144  NA 138 142  K 3.5 3.3*  CL 112* 114*  CO2 15* 17*  GLUCOSE 157* 128*  BUN 19 20  CREATININE 1.34* 1.32*  CALCIUM 9.4 9.3   PT/INR No results for input(s): LABPROT, INR in the last 72 hours. ABG Recent Labs    02/18/21 1140  PHART 7.305*  HCO3 14.8*    Studies/Results: CT HEAD WO CONTRAST ( )  Addendum Date: 02/18/2021   ADDENDUM REPORT: 02/18/2021 20:43 ADDENDUM: These results were called by telephone at the time of interpretation on 02/18/2021 at 8:42 pm to provider Audrea Muscat, who verbally acknowledged these results. Electronically Signed   By: Meda Klinefelter M.D.   On: 02/18/2021 20:43   Result Date: 02/18/2021 CLINICAL DATA:  Mental status change, unknown cause EXAM: CT HEAD WITHOUT CONTRAST TECHNIQUE: Contiguous axial images were obtained from the base of the skull through the vertex without intravenous contrast. COMPARISON:  None. FINDINGS: Brain: There is hypodensity involving the RIGHT posterior parieto-occipital lobe cortex (series 2, image 19). No acute  hemorrhage. No midline shift. Vascular: Vascular calcifications involving the carotid siphons. Skull: Normal. Negative for fracture or focal lesion. Sinuses/Orbits: No acute finding. Other: None. IMPRESSION: 1. Hypodensity involving the RIGHT posterior parietooccipital lobe cortex is concerning for infarction. Recommend further dedicated evaluation with brain MRI. PRA system was activated on 02/18/2021 at 8:09 p.m. at time of interpretation. Addendum will be issued upon telephonic communication of results. Electronically Signed: By: Meda Klinefelter M.D. On: 02/18/2021 20:15   MR BRAIN WO CONTRAST  Result Date: 02/18/2021 CLINICAL DATA:  Follow-up examination for acute stroke. EXAM: MRI HEAD WITHOUT CONTRAST TECHNIQUE: Multiplanar, multiecho pulse sequences of the brain and surrounding structures were obtained without intravenous contrast. COMPARISON:  Prior head CT from earlier the same day. FINDINGS: Brain: Cerebral volume within normal limits for age. Patchy T2/FLAIR hyperintensity involving the periventricular deep white matter both cerebral hemispheres as well as the pons, most consistent with chronic small vessel ischemic disease, mild to moderate in nature. Evolving right PCA territory infarct involving the parasagittal right occipital lobe is seen, late subacute in appearance with only mild residual diffusion abnormality seen. Associated petechial blood products without frank intraparenchymal hematoma or significant mass effect. Additional 7 mm acute ischemic cortical infarct seen involving the high right parietal lobe, postcentral gyrus (series 11, image 90). No associated edema or hemorrhage. Additional subtle 3 mm focus of diffusion abnormality noted at the high parasagittal left frontal lobe, likely an evolving subacute infarct (series 11, image 94). No associated hemorrhage or mass effect. No other evidence for acute or subacute ischemia.  No other acute intracranial hemorrhage. No mass lesion,  mass effect or midline shift. No hydrocephalus or extra-axial fluid collection. Pituitary gland suprasellar region within normal limits. Midline structures intact. Vascular: Major intracranial vascular flow voids are maintained. Skull and upper cervical spine: Of craniocervical junction within normal limits. Bone marrow signal intensity normal. No scalp soft tissue abnormality. Sinuses/Orbits: Globes and orbital soft tissues demonstrate no acute finding. Paranasal sinuses are largely clear. No mastoid effusion. Inner ear structures grossly normal. Other: None. IMPRESSION: 1. 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 2. Evolving late subacute right PCA territory infarct involving the parasagittal right occipital lobe. Associated petechial blood products without frank intraparenchymal hematoma or significant mass effect. 3. Additional 3 mm focus of diffusion abnormality involving the high parasagittal left frontal lobe, likely an evolving subacute infarct. No associated hemorrhage or mass effect. 4. Underlying mild to moderate chronic microvascular ischemic disease. Electronically Signed   By: Rise Mu M.D.   On: 02/18/2021 23:25   DG Chest Port 1 View  Result Date: 02/18/2021 CLINICAL DATA:  Altered mental status, abdominal pain EXAM: PORTABLE CHEST 1 VIEW COMPARISON:  Radiograph 02/15/2017 FINDINGS: Unchanged cardiomediastinal silhouette. There is no focal airspace consolidation. There is no large pleural effusion or visible pneumothorax. There is no acute osseous abnormality. IMPRESSION: No evidence of acute cardiopulmonary disease. Electronically Signed   By: Caprice Renshaw M.D.   On: 02/18/2021 12:11   DG Abd Portable 1V  Result Date: 02/18/2021 CLINICAL DATA:  Abdominal pain EXAM: PORTABLE ABDOMEN - 1 VIEW COMPARISON:  CT abdomen and pelvis dated February 16, 2021 FINDINGS: No gas-filled dilated loops of bowel. No evidence of  free air, although supine position limits evaluation. Large rounded density is seen in the lower abdomen, measuring 21.4 x 18.0 cm, which displaces multiple loops of bowel. Surgical drain noted in the right hemiabdomen. IMPRESSION: Large rounded density is seen in the lower abdomen, likely due to distended urinary conduit. Correlate with urine output. Electronically Signed   By: Allegra Lai M.D.   On: 02/18/2021 12:22    Anti-infectives: Anti-infectives (From admission, onward)    Start     Dose/Rate Route Frequency Ordered Stop   02/12/21 1200  piperacillin-tazobactam (ZOSYN) IVPB 3.375 g        3.375 g 12.5 mL/hr over 240 Minutes Intravenous Every 8 hours 02/12/21 0950     02/06/21 2003  ceFAZolin (ANCEF) 2-4 GM/100ML-% IVPB       Note to Pharmacy: Ponciano Ort   : cabinet override      02/06/21 2003 02/06/21 2046   02/06/21 1945  ceFAZolin (ANCEF) IVPB 2g/100 mL premix        2 g 200 mL/hr over 30 Minutes Intravenous On call to O.R. 02/06/21 1914 02/06/21 2028       Assessment/Plan: s/p Procedure(s): EXPLORATORY LAPAROTOMY; LYSIS OF ADHESIONS; INCISIONAL HERNIA REPAIR,SMALL BOWEL RESECTION (N/A) Advance diet. On regular diet but not eating much. May need a calorie count Ventral hernia with SBO POD#13 S/P ventral hernia repair with small bowel resection, LOA 10/31 Dr. Sheliah Hatch - wound vac MWF - will need vac at home, order placed.  - s/p IR drain 11/5, culture with somewhat resistant E coli but sensitive to zosyn - continue iv zosyn during admission and plan 2 weeks augmentin at discharge - continue drain and monitor output - PT/OT, encourage mobilization - recommending HH PT - encourage PO intake and continue bowel regimen - CT 11/10 showed  resolution of drained fluid collection with drain in place and a few dots of residual air, no new concerning findings. No definite enterocutaneous fistula but is high risk. Keep drain in place, will need follow up in IR with drain injection  prior to considering removal.   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

## 2021-02-20 ENCOUNTER — Inpatient Hospital Stay (HOSPITAL_COMMUNITY): Payer: Medicare PPO

## 2021-02-20 DIAGNOSIS — I639 Cerebral infarction, unspecified: Secondary | ICD-10-CM

## 2021-02-20 LAB — COMPREHENSIVE METABOLIC PANEL
ALT: 72 U/L — ABNORMAL HIGH (ref 0–44)
AST: 73 U/L — ABNORMAL HIGH (ref 15–41)
Albumin: 2.7 g/dL — ABNORMAL LOW (ref 3.5–5.0)
Alkaline Phosphatase: 74 U/L (ref 38–126)
Anion gap: 10 (ref 5–15)
BUN: 16 mg/dL (ref 8–23)
CO2: 17 mmol/L — ABNORMAL LOW (ref 22–32)
Calcium: 9.1 mg/dL (ref 8.9–10.3)
Chloride: 116 mmol/L — ABNORMAL HIGH (ref 98–111)
Creatinine, Ser: 1.09 mg/dL — ABNORMAL HIGH (ref 0.44–1.00)
GFR, Estimated: 56 mL/min — ABNORMAL LOW (ref >60)
Glucose, Bld: 106 mg/dL — ABNORMAL HIGH (ref 70–99)
Potassium: 3.6 mmol/L (ref 3.5–5.1)
Sodium: 143 mmol/L (ref 135–145)
Total Bilirubin: 0.5 mg/dL (ref 0.3–1.2)
Total Protein: 7 g/dL (ref 6.5–8.1)

## 2021-02-20 LAB — CBC WITH DIFFERENTIAL/PLATELET
Abs Immature Granulocytes: 0.08 10*3/uL — ABNORMAL HIGH (ref 0.00–0.07)
Basophils Absolute: 0 10*3/uL (ref 0.0–0.1)
Basophils Relative: 0 %
Eosinophils Absolute: 0.2 10*3/uL (ref 0.0–0.5)
Eosinophils Relative: 2 %
HCT: 31.8 % — ABNORMAL LOW (ref 36.0–46.0)
Hemoglobin: 10.4 g/dL — ABNORMAL LOW (ref 12.0–15.0)
Immature Granulocytes: 1 %
Lymphocytes Relative: 20 %
Lymphs Abs: 1.5 10*3/uL (ref 0.7–4.0)
MCH: 32.2 pg (ref 26.0–34.0)
MCHC: 32.7 g/dL (ref 30.0–36.0)
MCV: 98.5 fL (ref 80.0–100.0)
Monocytes Absolute: 0.9 10*3/uL (ref 0.1–1.0)
Monocytes Relative: 12 %
Neutro Abs: 4.9 10*3/uL (ref 1.7–7.7)
Neutrophils Relative %: 65 %
Platelets: 581 10*3/uL — ABNORMAL HIGH (ref 150–400)
RBC: 3.23 MIL/uL — ABNORMAL LOW (ref 3.87–5.11)
RDW: 14.7 % (ref 11.5–15.5)
WBC: 7.6 10*3/uL (ref 4.0–10.5)
nRBC: 0 % (ref 0.0–0.2)

## 2021-02-20 LAB — GLUCOSE, CAPILLARY
Glucose-Capillary: 114 mg/dL — ABNORMAL HIGH (ref 70–99)
Glucose-Capillary: 106 mg/dL — ABNORMAL HIGH (ref 70–99)
Glucose-Capillary: 127 mg/dL — ABNORMAL HIGH (ref 70–99)
Glucose-Capillary: 156 mg/dL — ABNORMAL HIGH (ref 70–99)

## 2021-02-20 IMAGING — MR MR MRA HEAD W/O CM
1 series · 20 of 48 positions shown · non-contrast
Comparison: Two days ago

CLINICAL DATA: Stroke follow-up

EXAM:
MRA HEAD WITHOUT CONTRAST
TECHNIQUE: Angiographic images of the Circle of Willis were acquired using MRA
technique without intravenous contrast.

[Series 9: TOF · axial · 0.6mm · 0.35mm/px · z∈[-53,+45]mm · 20 of 172 slices shown]
[im 1/172]
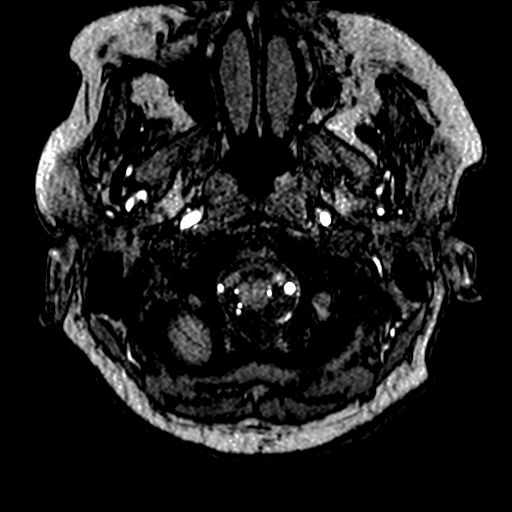
[im 4/172]
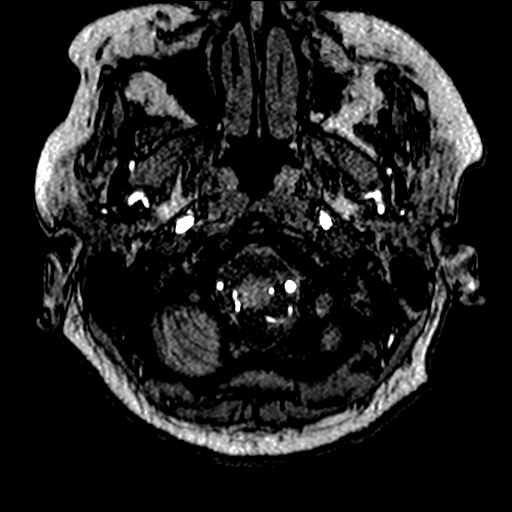
[im 8/172]
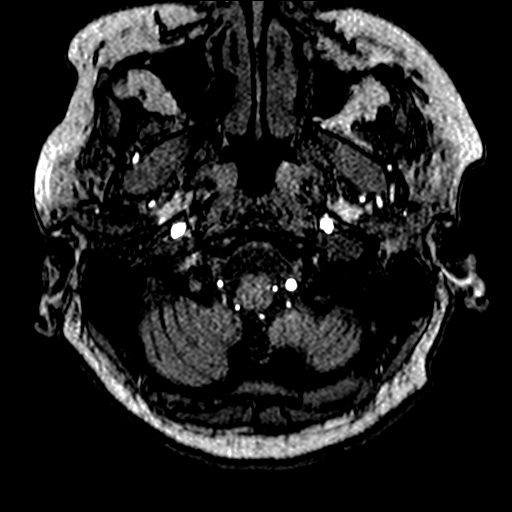
[im 11/172]
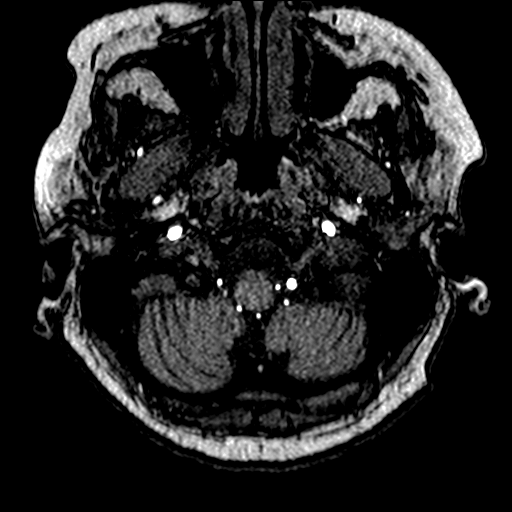
[im 15/172]
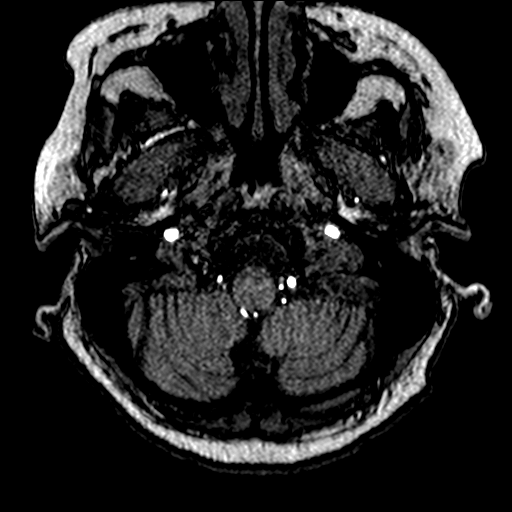
[im 19/172]
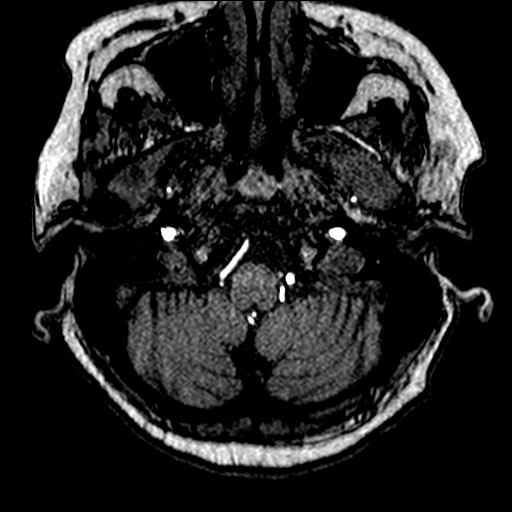
[im 22/172]
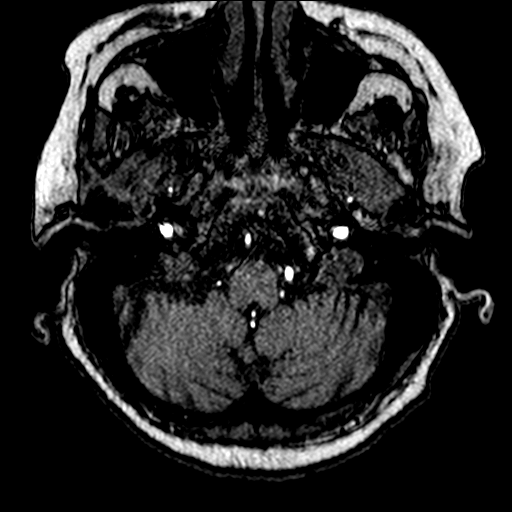
[im 26/172]
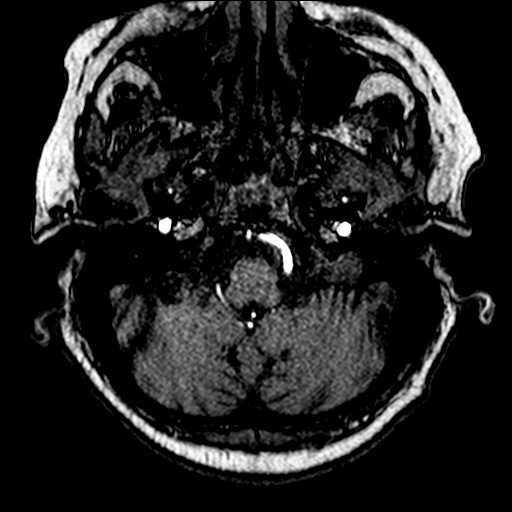
[im 30/172]
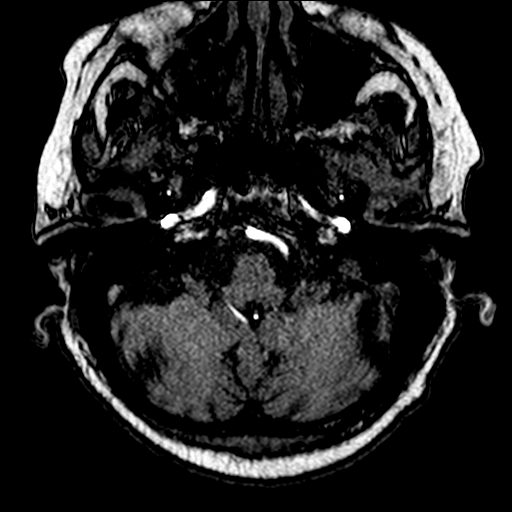
[im 33/172]
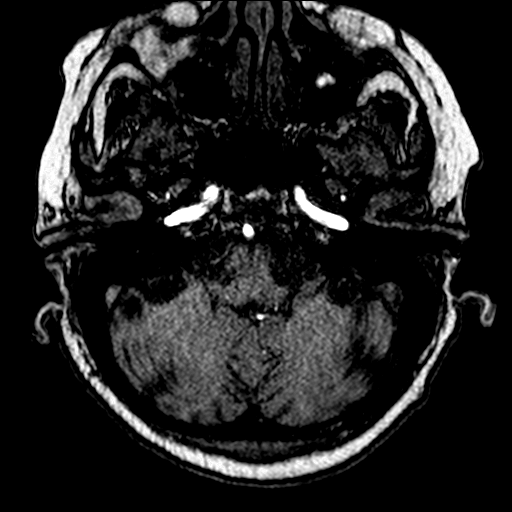
[im 37/172]
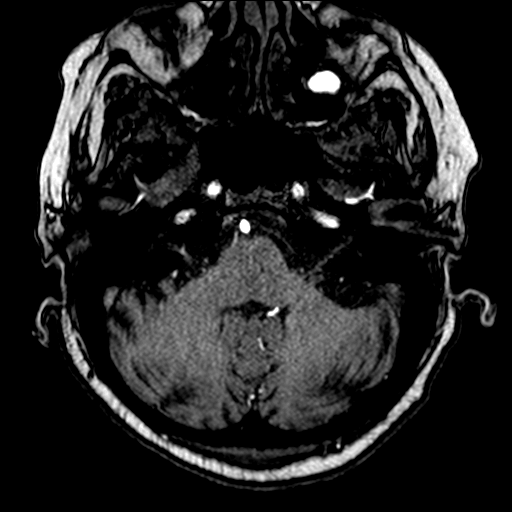
[im 41/172]
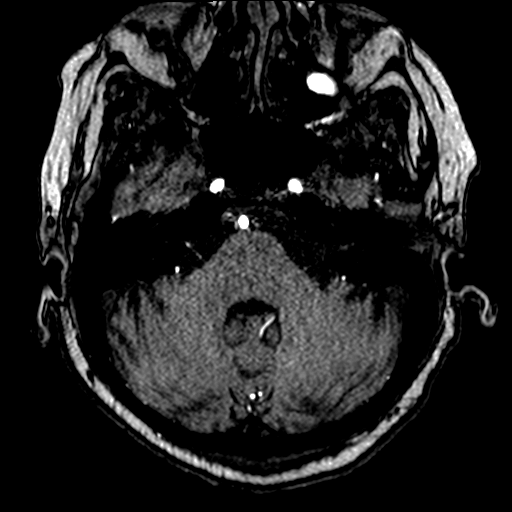
[im 55/172]
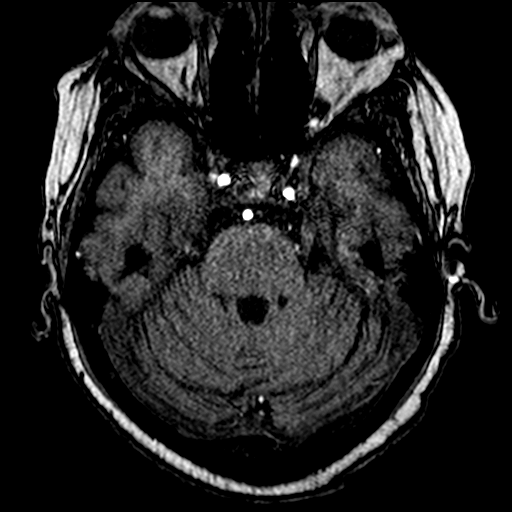
[im 77/172]
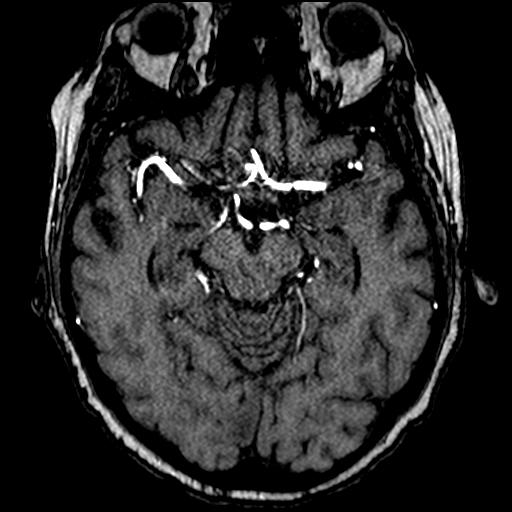
[im 88/172]
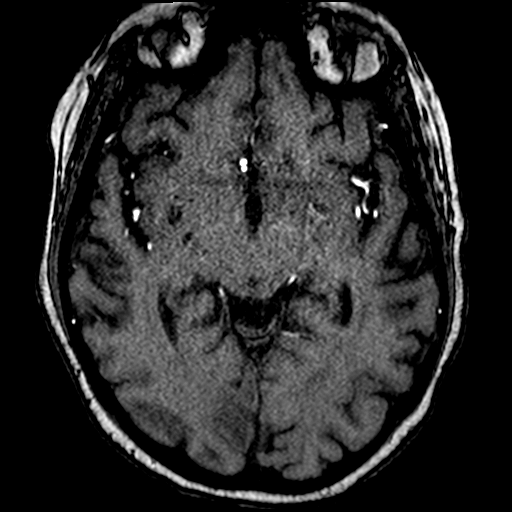
[im 99/172]
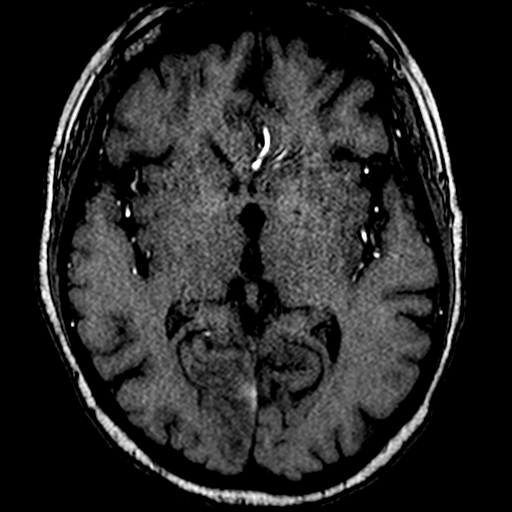
[im 121/172]
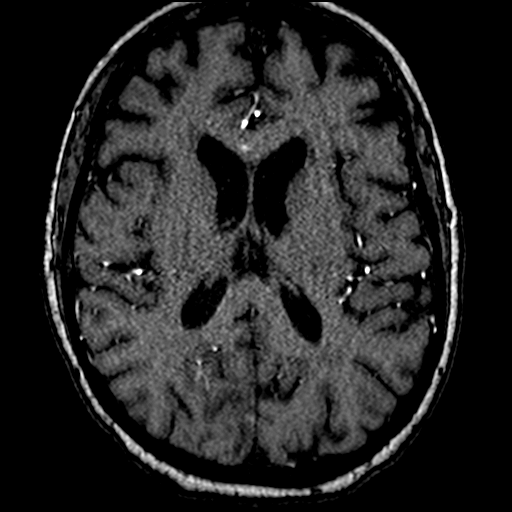
[im 142/172]
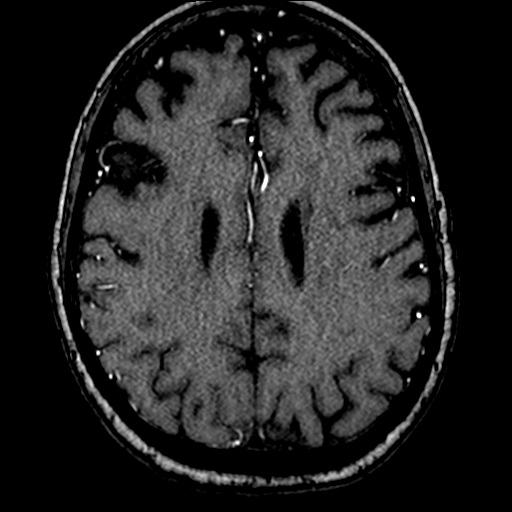
[im 146/172]
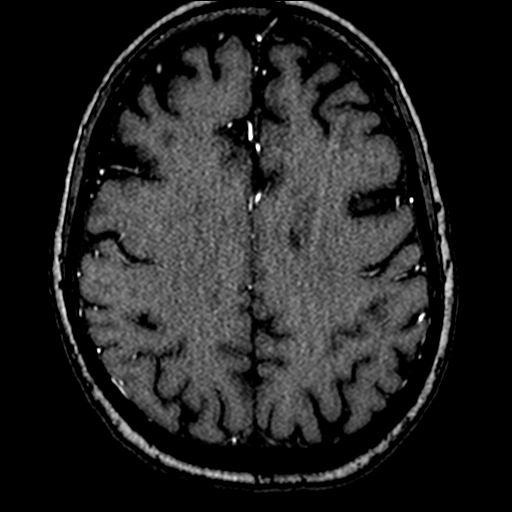
[im 164/172]
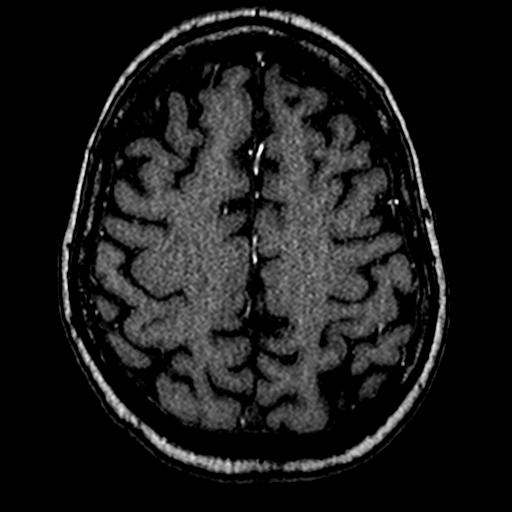

[20 of 48 positions shown; findings below may reference images not displayed]

FINDINGS: Anterior circulation: Luminal irregularity of the carotid siphons
with luminal narrowing at the anterior genu on the right that is
moderate and likely accentuated by flow artifact and mild motion.
Mild atheromatous irregularity of MCA branches. Negative for
aneurysm or beading.

Posterior circulation: Left dominant. No branch occlusion, beading,
or aneurysm.
IMPRESSION: 1. No emergent finding.
2. Irregularity of the carotid siphons attributed to atherosclerosis
with up to moderate narrowing on the right.

## 2021-02-20 MED ORDER — STROKE: EARLY STAGES OF RECOVERY BOOK
Freq: Once | Status: AC
  Filled 2021-02-20: qty 1

## 2021-02-20 NOTE — Progress Notes (Signed)
Laurie [name]  JOA:[id number] DOB: 19-Mar-1956 DOA: 02/06/2021 PCP: Margit Hanks, MD    Brief Narrative:  59FU w/ a hx of DM2, HTN, HLD, anxiety/depression, GERD, obesity, and multiple abdomino-pelvic surgeries who presented to the ED with 5 days of worsening lower abdominal pain associated with loose stools and vomiting. In the ED CT revealed an incarcerated ventral hernia. EDP attempted reduction though symptoms did not improve. Labs notable for acute renal failure in the setting of dehydration. She was transferred to [facility] ED for surgical consult, and repeat CT abdomen showed persistent hernia with bowel obstruction for which she was taken to the OR.    Today, pt denies any new complaints, more oriented, almost back to baseline    Assessment & Plan: Incarcerated ventral hernia with SBO Status post repair with small bowel resection and LOA 10/31 Persisting abdominal discomfort and climbing WBC raised concern leading to CT abdomen pelvis 11/4 which noted questionable fluid collection within the abdomen which was drained by IR  Further management per General Surgery  Anterior peirtoneal abscess Afebrile, with leukocytosis Drained in IR 11/5 Continue IV Zosyn  Acute renal failure Metabolic acidosis Acute urinary retention Cr bumped up to 1.34-->1.09 Possibly due to acute retention  Bladder scan >1000 , will conitnue TID In and out prn (pt reports self cath about 3 times a day due to urinary diversion)  E.coli UTI Urine culture >100,000 E.coli, 40,000 klebsiella pneumoniae  On IV zosyn, await susceptibility  Hypokalemia Replace prn Daily CMP  Acute CVA MRI showed acute ischemic nonhemorrhagic cortical infarct cortical infarct, evolving late subacute right PCA territory infarct involving the right occipital lobe, associated petechial blood products without frank intraparenchymal hematoma or significant mass-effect MRA head and neck no emergent findings Echo with EF  65 to 70%, grade 1 diastolic dysfunction, bubble study negative with no evidence of inter-atrial shunt LDL, 97, A1c--> 6.2 Start ASA PT/OT Telemetry Neurology consulted  Acute metabolic encephalopathy Improving Likely due to UTI vs CVA as above Afebrile, with resolved leukocytosis BC X 2 NGTD Abd xray with distended urinary conduit CXR unremarkable Monitor closely  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 Vonita Moss at [facility] - history of cystectomy and right colon pouch for interstitial cystitis 1990s - pouch stone is found to be nonobstructing  DM2 SSI, accuchecks  HTN BP stable  COPD Stable Continue usual Dulera and as needed albuterol  GERD PPI  Obesity - Body mass index is 34.33 kg/m. Lifestyle modification advised    Significant events: 10/31 ventral hernia repair with small bowel resection and LOA 11/5 CT-guided drainage of anterior peritoneal abscess in IR -10 French drain left in place none  Consultants:  General Surgery IR Neurology  Code Status: FULL CODE  Antimicrobials:  Zosyn 11/6 >  DVT prophylaxis: Lovenox  Family Communication: None at bedside  Status is: Inpatient -ultimate disposition will be discharge home -she has many family members and an extensive support network   Objective: Blood pressure (!) 166/69, pulse (!) 54, temperature 98.2 F (36.8 C), temperature source Oral, resp. rate 16, height 5\' 4"  (1.626 m), weight 90.7 kg, SpO2 99 %.  Intake/Output Summary (Last 24 hours) at 02/20/2021 1440 Last data filed at 02/20/2021 0900 Gross per 24 hour  Intake --  Output 2327 ml  Net -2327 ml   Filed Weights   02/06/21 1053  Weight: 90.7 kg    Examination: General: NAD Cardiovascular: S1, S2 present Respiratory: CTAB Abdomen: Soft, tender, nondistended, bowel sounds  present, incision clean/dry/intact, wound vac noted Musculoskeletal: No bilateral pedal edema noted Skin: As above Psychiatry:  Normal mood Neurology: Strength equal in extremities, sensation intact, no obvious focal neurologic deficits   CBC: Recent Labs  Lab 02/18/21 1204 02/19/21 0144 02/20/21 0509  WBC 11.3* 10.9* 7.6  NEUTROABS 9.2* 7.3 4.9  HGB 11.8* 10.8* 10.4*  HCT 35.9* 33.3* 31.8*  MCV 98.6 99.4 98.5  PLT 580* 548* [id number]*   Basic Metabolic Panel: Recent Labs  Lab 02/15/21 0508 02/16/21 0445 02/18/21 1136 02/19/21 0144 02/20/21 0509  NA 139   < > 138 142 143  K 3.7   < > 3.5 3.3* 3.6  CL 111   < > 112* 114* 116*  CO2 21*   < > 15* 17* 17*  GLUCOSE 160*   < > 157* 128* 106*  BUN 18   < > 19 20 16   CREATININE 1.40*   < > 1.34* 1.32* 1.09*  CALCIUM 9.0   < > 9.4 9.3 9.1  MG 1.8  --  1.9  --   --    < > = values in this interval not displayed.   GFR: Estimated Creatinine Clearance: 55.4 mL/min (A) (by C-G formula based on SCr of 1.09 mg/dL (H)).  Liver Function Tests: Recent Labs  Lab 02/18/21 1136 02/19/21 0144 02/20/21 0509  AST 65* 78* 73*  ALT 57* 68* 72*  ALKPHOS 86 75 74  BILITOT 0.6 0.6 0.5  PROT 8.1 7.4 7.0  ALBUMIN 3.1* 2.8* 2.7*  No results for input(s): LIPASE, AMYLASE in the last 168 hours.   HbA1C: Hgb A1c MFr Bld  Date/Time Value Ref Range Status  02/07/2021 05:46 AM 6.2 (H) 4.8 - 5.6 % Final    Comment:    (NOTE) Pre diabetes:          5.7%-6.4%  Diabetes:              >6.4%  Glycemic control for   <7.0% adults with diabetes   06/25/2015 05:06 AM 6.2 (H) 4.8 - 5.6 % Final    Comment:    (NOTE)         Pre-diabetes: 5.7 - 6.4         Diabetes: >6.4         Glycemic control for adults with diabetes: <7.0     CBG: Recent Labs  Lab 02/19/21 1149 02/19/21 1553 02/19/21 1949 02/20/21 0751 02/20/21 1402  GLUCAP 124* 148* 83 114* 106*    Recent Results (from the past 240 hour(s))  Aerobic/Anaerobic Culture w Gram Stain (surgical/deep wound)     Status: None   Collection Time: 02/11/21  1:56 PM   Specimen: Abscess  Result Value Ref Range  Status   Specimen Description   Final    ABSCESS Performed at [facility] [address], [place], [place] [address]    Special Requests   Final    Normal Performed at [facility], [place] [address] [place], [place] [address]    Gram Stain   Final    NO SQUAMOUS EPITHELIAL CELLS SEEN ABUNDANT WBC SEEN MODERATE GRAM NEGATIVE RODS MODERATE GRAM POSITIVE COCCI    Culture   Final    ABUNDANT ESCHERICHIA COLI NO ANAEROBES ISOLATED Performed at [facility], [address] [place], [place] [address]    Report Status 02/15/2021 FINAL  Final   Organism ID, Bacteria ESCHERICHIA COLI  Final      Susceptibility   Escherichia  coli - MIC*    AMPICILLIN >=32 RESISTANT Resistant     CEFAZOLIN >=64 RESISTANT Resistant     CEFEPIME <=0.12 SENSITIVE Sensitive     CEFTAZIDIME 16 INTERMEDIATE Intermediate     CEFTRIAXONE 32 RESISTANT Resistant     CIPROFLOXACIN <=0.25 SENSITIVE Sensitive     GENTAMICIN <=1 SENSITIVE Sensitive     IMIPENEM <=0.25 SENSITIVE Sensitive     TRIMETH/SULFA <=20 SENSITIVE Sensitive     AMPICILLIN/SULBACTAM >=32 RESISTANT Resistant     PIP/TAZO <=4 SENSITIVE Sensitive     * ABUNDANT ESCHERICHIA COLI  Culture, blood (routine x 2)     Status: None (Preliminary result)   Collection Time: 02/18/21 12:04 PM   Specimen: BLOOD  Result Value Ref Range Status   Specimen Description   Final    BLOOD RIGHT ANTECUBITAL Performed at [facility], [address] [place], [place] [address]    Special Requests   Final    BOTTLES DRAWN AEROBIC ONLY Blood Culture adequate volume Performed at [facility], [address] [place], [place] [address]    Culture   Final    NO GROWTH 2 DAYS Performed at [facility], [address] [place], [place] [address]    Report Status PENDING  Incomplete  Culture, blood (routine x 2)     Status: None (Preliminary result)   Collection Time: 02/18/21  12:04 PM   Specimen: BLOOD LEFT HAND  Result Value Ref Range Status   Specimen Description   Final    BLOOD LEFT HAND Performed at [facility], [address] [place], [place] [address]    Special Requests   Final    BOTTLES DRAWN AEROBIC ONLY Blood Culture results may not be optimal due to an inadequate volume of blood received in culture bottles Performed at [facility], [address] [place], [place] [address]    Culture   Final    NO GROWTH 2 DAYS Performed at [facility], [address] [place], [place] [address]    Report Status PENDING  Incomplete  Urine Culture     Status: Abnormal (Preliminary result)   Collection Time: 02/18/21 12:48 PM   Specimen: In/Out Cath Urine  Result Value Ref Range Status   Specimen Description   Final    IN/OUT CATH URINE Performed at [facility], [address] [place], [place] [address]    Special Requests   Final    NONE Performed at [facility], [address] [place], [place] [address]    Culture (A)  Final    40,000 COLONIES/mL KLEBSIELLA PNEUMONIAE >=100,000 COLONIES/mL ESCHERICHIA COLI SUSCEPTIBILITIES TO FOLLOW Performed at [facility], [address] [place], [place] [address]    Report Status PENDING  Incomplete      Scheduled Meds:   stroke: mapping our early stages of recovery book   Does not apply Once   aspirin EC  81 mg Oral Daily   enoxaparin (LOVENOX) injection  40 mg Subcutaneous Q24H   feeding supplement  1 Container Oral TID BM   insulin aspart  0-5 Units Subcutaneous QHS   insulin aspart  0-9 Units Subcutaneous TID WC   melatonin  3 mg Oral QHS   methocarbamol  500 mg Oral Q6H   metoprolol succinate  50 mg Oral Daily   mometasone-formoterol  2 puff Inhalation BID   pantoprazole  40 mg Oral Daily  polycarbophil  625 mg Oral BID   polyethylene glycol  17 g Oral Daily   QUEtiapine  12.5 mg Oral QHS    rosuvastatin  10 mg Oral Daily   saccharomyces boulardii  250 mg Oral BID   sodium chloride flush  3 mL Intravenous Q12H   sodium chloride flush  5 mL Intracatheter Daily   Continuous Infusions:  piperacillin-tazobactam (ZOSYN)  IV 3.375 g (02/20/21 1206)     LOS: 13 days   Alma Friendly, MD [facility]   If 7PM-7AM, please contact night-coverage per Amion 02/20/2021, 2:40 PM

## 2021-02-20 NOTE — Progress Notes (Signed)
Dressing around biliary drain changed.

## 2021-02-20 NOTE — Consult Note (Signed)
Neurology Consultation Reason for Consult: Ischemic stroke Referring Physician: Gwenyth Allegra  CC: Altered mental status  History is obtained from: Patient, family  HPI: Laurie [name] is a 65 y.o. female originally admitted on 10/31 with abdominal pain. She was found to have incarcerated hernia, and has been recovering from this. She had wound cultures with positive E. coli and, urine culture with positive for E. coli and Klebsiella.  On Saturday, she was quite confused, responding to stimuli that were not there.  The patient states that she sees things on the walls that disappear.  She describes them as things like toys, or other formed items that she then realizes are not there.  Her family member called her on Saturday, and realized that she was not making sense and therefore alerted nursing staff who activated a code stroke.  The patient was evaluated by teleneurology and an MRI was performed.  The MRI revealed two punctate areas of cortical infarct.  Since that time, the patient has significantly improved.  Her UTI is being treated with Zosyn.   She continues to complain of abdominal pain.  LKW: Unclear tpa given?: no, unclear time of onset   ROS: A 14 point ROS was performed and is negative except as noted in the HPI.  Past Medical History:  Diagnosis Date   Anal fissure    Asthma    B12 deficiency    Candidal esophagitis (HCC)    Carpal tunnel syndrome    Chronic abdominal pain    Chronic UTI    CKD (chronic kidney disease)    Coronary artery disease    Diabetes mellitus without complication (HCC)    Diabetic gastropathy (HCC)    Episodic low back pain    GERD (gastroesophageal reflux disease)    GI bleed    Glaucoma    Hepatitis C    Hypertension    IBS (irritable bowel syndrome)    Interstitial cystitis    Iron deficiency anemia    Myofascial pain syndrome    Obesity (BMI 35.0-39.9 without comorbidity)    Plantar fasciitis      Family History  Problem  Relation Age of Onset   CAD Mother    Cancer Father    Stroke Brother      Social History:  reports that she has quit smoking. She has never used smokeless tobacco. She reports that she does not drink alcohol and does not use drugs.   Exam: Current vital signs: BP (!) 166/69 (BP Location: Left Arm)   Pulse (!) 54   Temp 98.2 F (36.8 C) (Oral)   Resp 16   Ht 5\' 4"  (1.626 m)   Wt 90.7 kg   SpO2 99%   BMI 34.33 kg/m  Vital signs in last 24 hours: Temp:  [98 F (36.7 C)-98.3 F (36.8 C)] 98.2 F (36.8 C) (11/14 1350) Pulse Rate:  [54-57] 54 (11/14 1350) Resp:  [16-20] 16 (11/14 0633) BP: (147-166)/(63-69) 166/69 (11/14 1350) SpO2:  [97 %-100 %] 99 % (11/14 1350)   Physical Exam  Constitutional: Appears well-developed and well-nourished.  Psych: Affect appropriate to situation Eyes: No scleral injection HENT: No OP obstruction MSK: no joint deformities.  Cardiovascular: Normal rate and regular rhythm.  Respiratory: Effort normal, non-labored breathing GI: She complains of some tenderness  skin: WDI  Neuro: Mental Status: Patient is awake, alert, she gives the month is February and the year is 2002.  No signs of aphasia or neglect Cranial Nerves: II: Visual Fields  are full. Pupils are equal, round, and reactive to light.   III,IV, VI: EOMI without ptosis or diploplia.  V: Facial sensation is symmetric to temperature VII: Facial movement is symmetric.  VIII: hearing is intact to voice X: Uvula elevates symmetrically XI: Shoulder shrug is symmetric. XII: tongue is midline without atrophy or fasciculations.  Motor: Tone is normal. Bulk is normal. 5/5 strength was present in all four extremities.  Sensory: Sensation is symmetric to light touch and temperature in the arms and legs. Cerebellar: FNF intact bilaterally    I have reviewed labs in epic and the results pertinent to this consultation are: Urine culture from 11/12-positive for Klebsiella and E.  Coli   I have reviewed the images obtained: MRI brain-two punctate cortical strokes.  Impression: 65 year old female admitted with incarcerated hernia with what I suspect was a multifactorial delirium including UTI, pain/pain medication, prolonged hospitalization, possibly with some contribution of these tiny acute strokes.  Her delirium appears to be improving, but I would continue to limit anticholinergic medicines and limit narcotics if possible.   I suspect that the strokes are incidental, but they do appear embolic in nature and therefore feel that further work-up is indicated.  TTE was negative, but given that she has had multiple infections, and with no other clear reason for cardioembolic stroke I feel that TEE would be indicated.  Recommendations: 1) transesophageal echocardiogram 2) continue aspirin 81 mg daily 3) LDL 97, recently started on Crestor.  I do not think that this was an atherogenic stroke and therefore do not feel she needs high intensity statin 4) PT, OT, ST 5) telemetry, would consider prolonged outpatient cardiac monitoring.   Ritta Slot, MD [facility] [phone]  If 7pm- 7am, please page neurology on call as listed in AMION.

## 2021-02-20 NOTE — Progress Notes (Signed)
14 Days Post-Op  Subjective: CC: Notes reviewed from over the weekend. Patient had new confusion/AMS and was found to have a CVA  Patient reports lower abdominal pain is worse on the right side and overall improved from yesterday.  WBC normalized.  Afebrile.  She reports she had a piece of toast and applesauce yesterday as well as a few sips of water. No other PO intake. No nausea or vomiting.  BM documented on I/O for yesterday. She is unsure of flatus.   Objective: Vital signs in last 24 hours: Temp:  [98 F (36.7 C)-98.7 F (37.1 C)] 98.3 F (36.8 C) (11/14 0633) Pulse Rate:  [56-76] 56 (11/14 0633) Resp:  [16-20] 16 (11/14 0633) BP: (147-163)/(63-81) 161/69 (11/14 0633) SpO2:  [97 %-100 %] 99 % (11/14 0740) Last BM Date: 02/19/21  Intake/Output from previous day: 11/13 0701 - 11/14 0700 In: 790.3 [IV Piggyback:790.3] Out: 2325 [Urine:2225; Drains:100] Intake/Output this shift: No intake/output data recorded.  PE: Gen:  Alert, NAD, pleasant Heart: Reg Pulm: CTA b/l. Rate and effort normal Abd: vac to midline with good seal. Thin yellow drainage in cannister. Abd soft, nondistended with lower abdominal tenderness greater on the right. No rigidity or guarding. Drain output purulent Ext: MAE's  Lab Results:  Recent Labs    02/19/21 0144 02/20/21 0509  WBC 10.9* 7.6  HGB 10.8* 10.4*  HCT 33.3* 31.8*  PLT 548* 581*   BMET Recent Labs    02/19/21 0144 02/20/21 0509  NA 142 143  K 3.3* 3.6  CL 114* 116*  CO2 17* 17*  GLUCOSE 128* 106*  BUN 20 16  CREATININE 1.32* 1.09*  CALCIUM 9.3 9.1   PT/INR No results for input(s): LABPROT, INR in the last 72 hours. CMP     Component Value Date/Time   NA 143 02/20/2021 0509   K 3.6 02/20/2021 0509   CL 116 (H) 02/20/2021 0509   CO2 17 (L) 02/20/2021 0509   GLUCOSE 106 (H) 02/20/2021 0509   BUN 16 02/20/2021 0509   CREATININE 1.09 (H) 02/20/2021 0509   CALCIUM 9.1 02/20/2021 0509   PROT 7.0 02/20/2021 0509    ALBUMIN 2.7 (L) 02/20/2021 0509   AST 73 (H) 02/20/2021 0509   ALT 72 (H) 02/20/2021 0509   ALKPHOS 74 02/20/2021 0509   BILITOT 0.5 02/20/2021 0509   GFRNONAA 56 (L) 02/20/2021 0509   GFRAA 33 (L) 07/01/2015 0352   Lipase     Component Value Date/Time   LIPASE 34 02/06/2021 1315    Studies/Results: CT HEAD WO CONTRAST (5MM)  Addendum Date: 02/18/2021   ADDENDUM REPORT: 02/18/2021 20:43 ADDENDUM: These results were called by telephone at the time of interpretation on 02/18/2021 at 8:42 pm to provider Lovey Newcomer, who verbally acknowledged these results. Electronically Signed   By: Valentino Saxon M.D.   On: 02/18/2021 20:43   Result Date: 02/18/2021 CLINICAL DATA:  Mental status change, unknown cause EXAM: CT HEAD WITHOUT CONTRAST TECHNIQUE: Contiguous axial images were obtained from the base of the skull through the vertex without intravenous contrast. COMPARISON:  None. FINDINGS: Brain: There is hypodensity involving the RIGHT posterior parieto-occipital lobe cortex (series 2, image 19). No acute hemorrhage. No midline shift. Vascular: Vascular calcifications involving the carotid siphons. Skull: Normal. Negative for fracture or focal lesion. Sinuses/Orbits: No acute finding. Other: None. IMPRESSION: 1. Hypodensity involving the RIGHT posterior parietooccipital lobe cortex is concerning for infarction. Recommend further dedicated evaluation with brain MRI. PRA system was activated on 02/18/2021  at 8:09 p.m. at time of interpretation. Addendum will be issued upon telephonic communication of results. Electronically Signed: By: Valentino Saxon M.D. On: 02/18/2021 20:15   MR BRAIN WO CONTRAST  Result Date: 02/18/2021 CLINICAL DATA:  Follow-up examination for acute stroke. EXAM: MRI HEAD WITHOUT CONTRAST TECHNIQUE: Multiplanar, multiecho pulse sequences of the brain and surrounding structures were obtained without intravenous contrast. COMPARISON:  Prior head CT from earlier the same  day. FINDINGS: Brain: Cerebral volume within normal limits for age. Patchy T2/FLAIR hyperintensity involving the periventricular deep white matter both cerebral hemispheres as well as the pons, most consistent with chronic small vessel ischemic disease, mild to moderate in nature. Evolving right PCA territory infarct involving the parasagittal right occipital lobe is seen, late subacute in appearance with only mild residual diffusion abnormality seen. Associated petechial blood products without frank intraparenchymal hematoma or significant mass effect. Additional 7 mm acute ischemic cortical infarct seen involving the high right parietal lobe, postcentral gyrus (series 11, image 90). No associated edema or hemorrhage. Additional subtle 3 mm focus of diffusion abnormality noted at the high parasagittal left frontal lobe, likely an evolving subacute infarct (series 11, image 94). No associated hemorrhage or mass effect. No other evidence for acute or subacute ischemia. No other acute intracranial hemorrhage. No mass lesion, mass effect or midline shift. No hydrocephalus or extra-axial fluid collection. Pituitary gland suprasellar region within normal limits. Midline structures intact. Vascular: Major intracranial vascular flow voids are maintained. Skull and upper cervical spine: Of craniocervical junction within normal limits. Bone marrow signal intensity normal. No scalp soft tissue abnormality. Sinuses/Orbits: Globes and orbital soft tissues demonstrate no acute finding. Paranasal sinuses are largely clear. No mastoid effusion. Inner ear structures grossly normal. Other: None. IMPRESSION: 1. 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 2. Evolving late subacute right PCA territory infarct involving the parasagittal right occipital lobe. Associated petechial blood products without frank intraparenchymal hematoma or significant mass effect. 3.  Additional 3 mm focus of diffusion abnormality involving the high parasagittal left frontal lobe, likely an evolving subacute infarct. No associated hemorrhage or mass effect. 4. Underlying mild to moderate chronic microvascular ischemic disease. Electronically Signed   By: Jeannine Boga M.D.   On: 02/18/2021 23:25   DG Chest Port 1 View  Result Date: 02/18/2021 CLINICAL DATA:  Altered mental status, abdominal pain EXAM: PORTABLE CHEST 1 VIEW COMPARISON:  Radiograph 02/15/2017 FINDINGS: Unchanged cardiomediastinal silhouette. There is no focal airspace consolidation. There is no large pleural effusion or visible pneumothorax. There is no acute osseous abnormality. IMPRESSION: No evidence of acute cardiopulmonary disease. Electronically Signed   By: Maurine Simmering M.D.   On: 02/18/2021 12:11   DG Abd Portable 1V  Result Date: 02/18/2021 CLINICAL DATA:  Abdominal pain EXAM: PORTABLE ABDOMEN - 1 VIEW COMPARISON:  CT abdomen and pelvis dated February 16, 2021 FINDINGS: No gas-filled dilated loops of bowel. No evidence of free air, although supine position limits evaluation. Large rounded density is seen in the lower abdomen, measuring 21.4 x 18.0 cm, which displaces multiple loops of bowel. Surgical drain noted in the right hemiabdomen. IMPRESSION: Large rounded density is seen in the lower abdomen, likely due to distended urinary conduit. Correlate with urine output. Electronically Signed   By: Yetta Glassman M.D.   On: 02/18/2021 12:22   ECHOCARDIOGRAM COMPLETE BUBBLE STUDY  Result Date: 02/19/2021    ECHOCARDIOGRAM REPORT   Patient Name:   Malvika VINCENT Weiler Date of  Exam: 02/19/2021 Medical Rec #:  [id number]           Height:       64.0 in Accession #:    [id number]          Weight:       200.0 lb Date of Birth:  [birth year]           BSA:          1.956 m Patient Age:    66 years            BP:           176/99 mmHg Patient Gender: F                   HR:           64 bpm. Exam Location:   Inpatient Procedure: 2D Echo, Cardiac Doppler, Color Doppler and Saline Contrast Bubble            Study Indications:    Stroke, bubble  History:        Patient has no prior history of Echocardiogram examinations.                 CAD; Risk Factors:Hypertension and Diabetes.  Sonographer:    Glo Herring Referring Phys: [id number] [place]  1. Left ventricular ejection fraction, by estimation, is 65 to 70%. The left ventricle has normal function. The left ventricle has no regional wall motion abnormalities. Left ventricular diastolic parameters are consistent with Grade I diastolic dysfunction (impaired relaxation).  2. Right ventricular systolic function is normal. The right ventricular size is normal.  3. The mitral valve is grossly normal. Trivial mitral valve regurgitation.  4. The aortic valve was not well visualized. Aortic valve regurgitation is not visualized. Aortic valve mean gradient measures 6.0 mmHg.  5. Agitated saline contrast bubble study was negative, with no evidence of any interatrial shunt. Comparison(s): No prior Echocardiogram. FINDINGS  Left Ventricle: Left ventricular ejection fraction, by estimation, is 65 to 70%. The left ventricle has normal function. The left ventricle has no regional wall motion abnormalities. The left ventricular internal cavity size was normal in size. There is  no left ventricular hypertrophy. Left ventricular diastolic parameters are consistent with Grade I diastolic dysfunction (impaired relaxation). Indeterminate filling pressures. Right Ventricle: The right ventricular size is normal. No increase in right ventricular wall thickness. Right ventricular systolic function is normal. Left Atrium: Left atrial size was normal in size. Right Atrium: Right atrial size was normal in size. Pericardium: There is no evidence of pericardial effusion. Mitral Valve: The mitral valve is grossly normal. Trivial mitral valve regurgitation. Tricuspid Valve: The  tricuspid valve is grossly normal. Tricuspid valve regurgitation is trivial. Aortic Valve: The aortic valve was not well visualized. Aortic valve regurgitation is not visualized. Aortic valve mean gradient measures 6.0 mmHg. Aortic valve peak gradient measures 10.9 mmHg. Aortic valve area, by VTI measures 1.96 cm. Pulmonic Valve: The pulmonic valve was normal in structure. Pulmonic valve regurgitation is not visualized. Aorta: The aortic root and ascending aorta are structurally normal, with no evidence of dilitation. IAS/Shunts: No atrial level shunt detected by color flow Doppler. Agitated saline contrast was given intravenously to evaluate for intracardiac shunting. Agitated saline contrast bubble study was negative, with no evidence of any interatrial shunt.  LEFT VENTRICLE PLAX 2D LVIDd:         4.00 cm   Diastology LVIDs:  2.50 cm   LV e' medial:    7.40 cm/s LV PW:         1.00 cm   LV E/e' medial:  8.9 LV IVS:        1.00 cm   LV e' lateral:   9.46 cm/s LVOT diam:     1.90 cm   LV E/e' lateral: 7.0 LV SV:         73 LV SV Index:   37 LVOT Area:     2.84 cm  RIGHT VENTRICLE             IVC RV Basal diam:  3.25 cm     IVC diam: 1.60 cm RV S prime:     15.80 cm/s LEFT ATRIUM             Index        RIGHT ATRIUM           Index LA diam:        3.60 cm 1.84 cm/m   RA Area:     11.70 cm LA Vol (A2C):   48.8 ml 24.94 ml/m  RA Volume:   24.40 ml  12.47 ml/m LA Vol (A4C):   45.1 ml 23.05 ml/m LA Biplane Vol: 49.2 ml 25.15 ml/m  AORTIC VALVE                     PULMONIC VALVE AV Area (Vmax):    2.20 cm      PV Vmax:       1.18 m/s AV Area (Vmean):   1.95 cm      PV Peak grad:  5.6 mmHg AV Area (VTI):     1.96 cm AV Vmax:           165.00 cm/s AV Vmean:          111.000 cm/s AV VTI:            0.373 m AV Peak Grad:      10.9 mmHg AV Mean Grad:      6.0 mmHg LVOT Vmax:         128.00 cm/s LVOT Vmean:        76.200 cm/s LVOT VTI:          0.258 m LVOT/AV VTI ratio: 0.69  AORTA Ao Root diam: 2.90 cm  MITRAL VALVE MV Area (PHT): 3.48 cm    SHUNTS MV Decel Time: 218 msec    Systemic VTI:  0.26 m MV E velocity: 66.00 cm/s  Systemic Diam: 1.90 cm MV A velocity: 96.80 cm/s MV E/A ratio:  0.68 Zoila Shutter MD Electronically signed by Zoila Shutter MD Signature Date/Time: 02/19/2021/1:28:02 PM    Final     Anti-infectives: Anti-infectives (From admission, onward)    Start     Dose/Rate Route Frequency Ordered Stop   02/12/21 1200  piperacillin-tazobactam (ZOSYN) IVPB 3.375 g        3.375 g 12.5 mL/hr over 240 Minutes Intravenous Every 8 hours 02/12/21 0950     02/06/21 2003  ceFAZolin (ANCEF) 2-4 GM/100ML-% IVPB       Note to Pharmacy: Ponciano Ort   : cabinet override      02/06/21 2003 02/06/21 2046   02/06/21 1945  ceFAZolin (ANCEF) IVPB 2g/100 mL premix        2 g 200 mL/hr over 30 Minutes Intravenous On call to O.R. 02/06/21 1914 02/06/21 2028        Assessment/Plan Ventral  hernia with SBO POD#14 S/P ventral hernia repair with small bowel resection, LOA 10/31 Dr. Kieth Brightly - s/p IR drain 11/5, culture with somewhat resistant E coli but sensitive to zosyn - continue  - CT 11/10 showed resolution of drained fluid collection with drain in place and a few dots of residual air, no new concerning findings. No definite enterocutaneous fistula but is high risk. Keep drain in place, will need follow up in IR with drain injection prior to considering removal. - continue drain and monitor output - wound vac MWF - will look at with RN today. Home vac order placed.  - PT/OT, encourage mobilization - previously recommending [place] PT. Needs reassessment after CVA - encourage PO intake and continue bowel regimen. Nutrition consult.  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

## 2021-02-21 ENCOUNTER — Inpatient Hospital Stay (HOSPITAL_COMMUNITY): Payer: Medicare PPO

## 2021-02-21 ENCOUNTER — Other Ambulatory Visit (HOSPITAL_COMMUNITY): Payer: Medicare PPO

## 2021-02-21 DIAGNOSIS — I639 Cerebral infarction, unspecified: Secondary | ICD-10-CM

## 2021-02-21 DIAGNOSIS — E440 Moderate protein-calorie malnutrition: Secondary | ICD-10-CM | POA: Insufficient documentation

## 2021-02-21 DIAGNOSIS — R4182 Altered mental status, unspecified: Secondary | ICD-10-CM

## 2021-02-21 LAB — CBC WITH DIFFERENTIAL/PLATELET
Abs Immature Granulocytes: 0.06 10*3/uL (ref 0.00–0.07)
Basophils Absolute: 0 10*3/uL (ref 0.0–0.1)
Basophils Relative: 1 %
Eosinophils Absolute: 0.1 10*3/uL (ref 0.0–0.5)
Eosinophils Relative: 2 %
HCT: 33.7 % — ABNORMAL LOW (ref 36.0–46.0)
Hemoglobin: 10.9 g/dL — ABNORMAL LOW (ref 12.0–15.0)
Immature Granulocytes: 1 %
Lymphocytes Relative: 16 %
Lymphs Abs: 1.3 10*3/uL (ref 0.7–4.0)
MCH: 32.2 pg (ref 26.0–34.0)
MCHC: 32.3 g/dL (ref 30.0–36.0)
MCV: 99.7 fL (ref 80.0–100.0)
Monocytes Absolute: 0.9 10*3/uL (ref 0.1–1.0)
Monocytes Relative: 11 %
Neutro Abs: 5.6 10*3/uL (ref 1.7–7.7)
Neutrophils Relative %: 69 %
Platelets: 579 10*3/uL — ABNORMAL HIGH (ref 150–400)
RBC: 3.38 MIL/uL — ABNORMAL LOW (ref 3.87–5.11)
RDW: 14.8 % (ref 11.5–15.5)
WBC: 8 10*3/uL (ref 4.0–10.5)
nRBC: 0 % (ref 0.0–0.2)

## 2021-02-21 LAB — URINE CULTURE: Culture: 40000 — AB

## 2021-02-21 LAB — COMPREHENSIVE METABOLIC PANEL
ALT: 67 U/L — ABNORMAL HIGH (ref 0–44)
AST: 57 U/L — ABNORMAL HIGH (ref 15–41)
Albumin: 2.9 g/dL — ABNORMAL LOW (ref 3.5–5.0)
Alkaline Phosphatase: 78 U/L (ref 38–126)
Anion gap: 10 (ref 5–15)
BUN: 12 mg/dL (ref 8–23)
CO2: 16 mmol/L — ABNORMAL LOW (ref 22–32)
Calcium: 9.1 mg/dL (ref 8.9–10.3)
Chloride: 117 mmol/L — ABNORMAL HIGH (ref 98–111)
Creatinine, Ser: 0.91 mg/dL (ref 0.44–1.00)
GFR, Estimated: >60 mL/min (ref >60)
Glucose, Bld: 135 mg/dL — ABNORMAL HIGH (ref 70–99)
Potassium: 3.1 mmol/L — ABNORMAL LOW (ref 3.5–5.1)
Sodium: 143 mmol/L (ref 135–145)
Total Bilirubin: 0.6 mg/dL (ref 0.3–1.2)
Total Protein: 7.2 g/dL (ref 6.5–8.1)

## 2021-02-21 LAB — GLUCOSE, CAPILLARY
Glucose-Capillary: 140 mg/dL — ABNORMAL HIGH (ref 70–99)
Glucose-Capillary: 123 mg/dL — ABNORMAL HIGH (ref 70–99)
Glucose-Capillary: 194 mg/dL — ABNORMAL HIGH (ref 70–99)
Glucose-Capillary: 161 mg/dL — ABNORMAL HIGH (ref 70–99)

## 2021-02-21 LAB — MAGNESIUM: Magnesium: 1.8 mg/dL (ref 1.7–2.4)

## 2021-02-21 MED ORDER — ADULT MULTIVITAMIN W/MINERALS CH
1.0000 | ORAL_TABLET | Freq: Every day | ORAL | Status: DC
  Administered 2021-02-21 – 2021-02-27 (×6): 1 via ORAL
  Filled 2021-02-21 (×6): qty 1

## 2021-02-21 MED ORDER — POTASSIUM CHLORIDE CRYS ER 20 MEQ PO TBCR
40.0000 meq | EXTENDED_RELEASE_TABLET | Freq: Once | ORAL | Status: AC
  Administered 2021-02-21: 40 meq via ORAL
  Filled 2021-02-21: qty 2

## 2021-02-21 MED ORDER — ENSURE ENLIVE PO LIQD
237.0000 mL | Freq: Two times a day (BID) | ORAL | Status: DC
Start: 2021-02-21 — End: 2021-02-27
  Administered 2021-02-21 – 2021-02-26 (×5): 237 mL via ORAL

## 2021-02-21 NOTE — Progress Notes (Signed)
Laurie [name]  [id number] DOB: 1956-03-05 DOA: 02/06/2021 PCP: Cindra Eves, MD    Brief NarrativeCV:[id number] w/ a hx of DM2, HTN, HLD, anxiety/depression, GERD, obesity, and multiple abdomino-pelvic surgeries who presented to the ED with 5 days of worsening lower abdominal pain associated with loose stools and vomiting. In the ED CT revealed an incarcerated ventral hernia. EDP attempted reduction though symptoms did not improve. Labs notable for acute renal failure in the setting of dehydration. She was transferred to [facility] ED for surgical consult, and repeat CT abdomen showed persistent hernia with bowel obstruction for which she was taken to the OR.    Today, patient denies any new complaint.    Assessment & Plan: Incarcerated ventral hernia with SBO Anterior peirtoneal abscess Currently afebrile with resolved leukocytosis Status post repair with small bowel resection and LOA 10/31 Persisting abdominal discomfort and climbing WBC raised concern leading to CT abdomen pelvis 11/4 which noted questionable fluid collection within the abdomen which was drained by IR on 11/5 Continue IV Zosyn as per gen surgery Further management per General Surgery and IR  Acute renal failure- improved Metabolic acidosis Acute urinary retention- resolved (pt self cath) Cr bumped up to 1.34-->1.09-->0.91 In and out prn (pt reports self cath about 3 times a day due to urinary diversion), informed RN to ensure its done   E.coli UTI Urine culture >100,000 E.coli, 40,000 klebsiella pneumoniae  On IV zosyn  Hypokalemia Replace prn Daily CMP  Acute CVA MRI showed acute ischemic nonhemorrhagic cortical infarct cortical infarct, evolving late subacute right PCA territory infarct involving the right occipital lobe, associated petechial blood products without frank intraparenchymal hematoma or significant mass-effect MRA head and neck no emergent findings Echo with EF 65 to 70%, grade 1  diastolic dysfunction, bubble study negative with no evidence of inter-atrial shunt LDL, 97, A1c--> 6.2 Start ASA, continue home crestor PT/OT Telemetry Neurology consulted- suspicious of embolic in nature, rec further work up Pending TEE, outpt cardiac monitoring (cardiology notified)  Acute metabolic encephalopathy Improving Likely due to UTI vs CVA as above Afebrile, with resolved leukocytosis BC X 2 NGTD Abd xray with distended urinary conduit CXR unremarkable Monitor closely  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 Terance Hart at [facility] - history of cystectomy and right colon pouch for interstitial cystitis 1990s - pouch stone is found to be nonobstructing  DM2 SSI, accuchecks  HTN BP stable  COPD Stable Continue usual Dulera and as needed albuterol  GERD PPI  Obesity - Body mass index is 34.33 kg/m. Lifestyle modification advised    Significant events: 10/31 ventral hernia repair with small bowel resection and LOA 11/5 CT-guided drainage of anterior peritoneal abscess in IR -10 French drain left in place none  Consultants:  General Surgery IR Neurology  Code Status: FULL CODE  Antimicrobials:  Zosyn 11/6 >  DVT prophylaxis: Lovenox  Family Communication: None at bedside  Status is: Inpatient -ultimate disposition will be discharge home -she has many family members and an extensive support network   Objective: Blood pressure (!) 152/75, pulse 62, temperature 97.9 F (36.6 C), temperature source Oral, resp. rate 20, height 5\' 4"  (1.626 m), weight 90.7 kg, SpO2 100 %.  Intake/Output Summary (Last 24 hours) at 02/21/2021 1750 Last data filed at 02/21/2021 1100 Gross per 24 hour  Intake --  Output 2736 ml  Net -2736 ml   Filed Weights   02/06/21 1053  Weight: 90.7 kg    Examination: General:  NAD Cardiovascular: S1, S2 present Respiratory: CTAB Abdomen: Soft, tender, nondistended, bowel sounds present,  incision clean/dry/intact, wound vac noted Musculoskeletal: No bilateral pedal edema noted Skin: As above Psychiatry: Normal mood Neurology: Strength equal in extremities, sensation intact, no obvious focal neurologic deficits   CBC: Recent Labs  Lab 02/19/21 0144 02/20/21 0509 02/21/21 0502  WBC 10.9* 7.6 8.0  NEUTROABS 7.3 4.9 5.6  HGB 10.8* 10.4* 10.9*  HCT 33.3* 31.8* 33.7*  MCV 99.4 98.5 99.7  PLT 548* 581* [id number]*   Basic Metabolic Panel: Recent Labs  Lab 02/15/21 0508 02/16/21 0445 02/18/21 1136 02/19/21 0144 02/20/21 0509 02/21/21 0502  NA 139   < > 138 142 143 143  K 3.7   < > 3.5 3.3* 3.6 3.1*  CL 111   < > 112* 114* 116* 117*  CO2 21*   < > 15* 17* 17* 16*  GLUCOSE 160*   < > 157* 128* 106* 135*  BUN 18   < > 19 20 16 12   CREATININE 1.40*   < > 1.34* 1.32* 1.09* 0.91  CALCIUM 9.0   < > 9.4 9.3 9.1 9.1  MG 1.8  --  1.9  --   --  1.8   < > = values in this interval not displayed.   GFR: Estimated Creatinine Clearance: 66.3 mL/min (by C-G formula based on SCr of 0.91 mg/dL).  Liver Function Tests: Recent Labs  Lab 02/18/21 1136 02/19/21 0144 02/20/21 0509 02/21/21 0502  AST 65* 78* 73* 57*  ALT 57* 68* 72* 67*  ALKPHOS 86 75 74 78  BILITOT 0.6 0.6 0.5 0.6  PROT 8.1 7.4 7.0 7.2  ALBUMIN 3.1* 2.8* 2.7* 2.9*  No results for input(s): LIPASE, AMYLASE in the last 168 hours.   HbA1C: Hgb A1c MFr Bld  Date/Time Value Ref Range Status  02/07/2021 05:46 AM 6.2 (H) 4.8 - 5.6 % Final    Comment:    (NOTE) Pre diabetes:          5.7%-6.4%  Diabetes:              >6.4%  Glycemic control for   <7.0% adults with diabetes   06/25/2015 05:06 AM 6.2 (H) 4.8 - 5.6 % Final    Comment:    (NOTE)         Pre-diabetes: 5.7 - 6.4         Diabetes: >6.4         Glycemic control for adults with diabetes: <7.0     CBG: Recent Labs  Lab 02/20/21 1639 02/20/21 2022 02/21/21 0803 02/21/21 1210 02/21/21 1719  GLUCAP 127* 156* 140* 123* 194*    Recent  Results (from the past 240 hour(s))  Culture, blood (routine x 2)     Status: None (Preliminary result)   Collection Time: 02/18/21 12:04 PM   Specimen: BLOOD  Result Value Ref Range Status   Specimen Description   Final    BLOOD RIGHT ANTECUBITAL Performed at [facility], [place] [address] [place], [place] [address]    Special Requests   Final    BOTTLES DRAWN AEROBIC ONLY Blood Culture adequate volume Performed at [place] [address], [place], [place] [address]    Culture   Final    NO GROWTH 3 DAYS Performed at [facility], [place] [address] [place],  [address]    Report Status PENDING  Incomplete  Culture, blood (routine x 2)  Status: None (Preliminary result)   Collection Time: 02/18/21 12:04 PM   Specimen: BLOOD LEFT HAND  Result Value Ref Range Status   Specimen Description   Final    BLOOD LEFT HAND Performed at [facility], [address], [place], [place] [address]    Special Requests   Final    BOTTLES DRAWN AEROBIC ONLY Blood Culture results may not be optimal due to an inadequate volume of blood received in culture bottles Performed at [facility], [address] [place], [place] [address]    Culture   Final    NO GROWTH 3 DAYS Performed at [facility], [address], [place], [place] [address]    Report Status PENDING  Incomplete  Urine Culture     Status: Abnormal   Collection Time: 02/18/21 12:48 PM   Specimen: In/Out Cath Urine  Result Value Ref Range Status   Specimen Description   Final    IN/OUT CATH URINE Performed at [facility], [address], [place], [place] [address]    Special Requests   Final    NONE Performed at [facility], [address] [place], [place] [address]    Culture (A)  Final    40,000 COLONIES/mL KLEBSIELLA PNEUMONIAE >=100,000 COLONIES/mL ESCHERICHIA COLI    Report Status  02/21/2021 FINAL  Final   Organism ID, Bacteria KLEBSIELLA PNEUMONIAE (A)  Final   Organism ID, Bacteria ESCHERICHIA COLI (A)  Final      Susceptibility   Escherichia coli - MIC*    AMPICILLIN 8 SENSITIVE Sensitive     CEFAZOLIN <=4 SENSITIVE Sensitive     CEFEPIME <=0.12 SENSITIVE Sensitive     CEFTRIAXONE <=0.25 SENSITIVE Sensitive     CIPROFLOXACIN <=0.25 SENSITIVE Sensitive     GENTAMICIN <=1 SENSITIVE Sensitive     IMIPENEM <=0.25 SENSITIVE Sensitive     NITROFURANTOIN <=16 SENSITIVE Sensitive     TRIMETH/SULFA <=20 SENSITIVE Sensitive     AMPICILLIN/SULBACTAM <=2 SENSITIVE Sensitive     PIP/TAZO <=4 SENSITIVE Sensitive     * >=100,000 COLONIES/mL ESCHERICHIA COLI   Klebsiella pneumoniae - MIC*    AMPICILLIN >=32 RESISTANT Resistant     CEFAZOLIN <=4 SENSITIVE Sensitive     CEFEPIME <=0.12 SENSITIVE Sensitive     CEFTRIAXONE <=0.25 SENSITIVE Sensitive     CIPROFLOXACIN <=0.25 SENSITIVE Sensitive     GENTAMICIN <=1 SENSITIVE Sensitive     IMIPENEM <=0.25 SENSITIVE Sensitive     NITROFURANTOIN 64 INTERMEDIATE Intermediate     TRIMETH/SULFA <=20 SENSITIVE Sensitive     AMPICILLIN/SULBACTAM 8 SENSITIVE Sensitive     PIP/TAZO <=4 SENSITIVE Sensitive     * 40,000 COLONIES/mL KLEBSIELLA PNEUMONIAE      Scheduled Meds:  aspirin EC  81 mg Oral Daily   enoxaparin (LOVENOX) injection  40 mg Subcutaneous Q24H   feeding supplement  237 mL Oral BID BM   insulin aspart  0-5 Units Subcutaneous QHS   insulin aspart  0-9 Units Subcutaneous TID WC   melatonin  3 mg Oral QHS   methocarbamol  500 mg Oral Q6H   metoprolol succinate  50 mg Oral Daily   mometasone-formoterol  2 puff Inhalation BID   multivitamin with minerals  1 tablet Oral Daily   pantoprazole  40 mg Oral Daily   polycarbophil  625 mg Oral BID   polyethylene glycol  17 g Oral Daily   QUEtiapine  12.5 mg Oral QHS   rosuvastatin  10 mg Oral Daily   saccharomyces boulardii  250 mg Oral BID   sodium chloride flush  3 mL  Intravenous Q12H   sodium chloride flush  5 mL Intracatheter Daily   Continuous Infusions:  piperacillin-tazobactam (ZOSYN)  IV 3.375 g (02/21/21 1329)     LOS: 14 days   Briant Cedar, MD [facility]   If 7PM-7AM, please contact night-coverage per Amion 02/21/2021, 5:50 PM

## 2021-02-21 NOTE — Progress Notes (Signed)
Carotid artery duplex has been completed. Preliminary results can be found in CV Proc through chart review.   02/21/21 10:04 AM Olen Cordial RVT

## 2021-02-21 NOTE — Progress Notes (Incomplete)
Called to patient room to assist with intermittent craterization of the urostomy size. Pt noted to have nectar thick secretion

## 2021-02-21 NOTE — Progress Notes (Signed)
Initial Nutrition Assessment  DOCUMENTATION CODES:   Non-severe (moderate) malnutrition in context of acute illness/injury, Obesity unspecified  INTERVENTION:   -Needs updated weight for admission (last recorded 10/31)  -Ensure Enlive po BID, each supplement provides 350 kcal and 20 grams of protein  -Multivitamin with minerals daily  NUTRITION DIAGNOSIS:   Moderate Malnutrition related to acute illness as evidenced by energy intake < 75% for > 7 days, mild muscle depletion.  GOAL:   Patient will meet greater than or equal to 90% of their needs  MONITOR:   PO intake, Supplement acceptance, Labs, Weight trends, I & O's  REASON FOR ASSESSMENT:   Consult Assessment of nutrition requirement/status  ASSESSMENT:   Laurie [name] w/ a hx of DM2, HTN, HLD, anxiety/depression, GERD, obesity, and multiple abdomino-pelvic surgeries who presented to the ED with 5 days of worsening lower abdominal pain associated with loose stools and vomiting. In the ED CT revealed an incarcerated ventral hernia. EDP attempted reduction though symptoms did not improve. Labs notable for acute renal failure in the setting of dehydration. She was transferred to [facility] ED for surgical consult, and repeat CT abdomen showed persistent hernia with bowel obstruction for which she was taken to the OR.  10/31: admitted, s/p SB resection & LOA 11/5: CT guided drainage of anterior peritoneal abscess  Patient in room, reports she has not eaten much today. Sipping on fluids. Was NPO. Now on solid diet. Pt has been on and off a diet since admission. States the last time she ate she ate some potatoes and jello this past weekend. Would like to try strawberry Ensure, ordered.  At the end of RD visit, pt was reporting she was seeing spots in the room.  Admission weight: 200 lbs. Needs updated weight for admission. Prior to surgery, pt states she was getting around with no issues. Reports sore legs and weakness.  Medications:  Fibercon, Miralax, KLOR-CON, Florastor  Labs reviewed:  CBGs: 123-156 Low K  NUTRITION - FOCUSED PHYSICAL EXAM:  Flowsheet Row Most Recent Value  Orbital Region No depletion  Upper Arm Region Mild depletion  Thoracic and Lumbar Region No depletion  Buccal Region No depletion  Temple Region Mild depletion  Clavicle Bone Region Mild depletion  Clavicle and Acromion Bone Region Mild depletion  Scapular Bone Region No depletion  Dorsal Hand Mild depletion  Patellar Region Mild depletion  Anterior Thigh Region Mild depletion  Posterior Calf Region Mild depletion  Edema (RD Assessment) None  Hair Reviewed  Eyes Reviewed  [reports seeing "spots"]  Mouth Reviewed  Skin Reviewed  Nails Reviewed       Diet Order:   Diet Order             Diet heart healthy/carb modified Room service appropriate? Yes; Fluid consistency: Thin  Diet effective now                   EDUCATION NEEDS:   No education needs have been identified at this time  Skin:  Skin Assessment: Skin Integrity Issues: Skin Integrity Issues:: Incisions Incisions: 10/31 abdomen  Last BM:  11/15  Height:   Ht Readings from Last 1 Encounters:  02/06/21 5\' 4"  (1.626 m)    Weight:   Wt Readings from Last 1 Encounters:  02/06/21 90.7 kg    BMI:  Body mass index is 34.33 kg/m.  Estimated Nutritional Needs:   Kcal:  1600-1800  Protein:  85-100g  Fluid:  1.8L/day  02/08/21, MS, RD, LDN Inpatient Clinical  Dietitian Contact information available via Amion

## 2021-02-21 NOTE — Progress Notes (Signed)
OT Cancellation Note  Patient Details Name: Laurie [name] MRN: [id number] DOB: 11/17/1955   Cancelled Treatment:    Reason Eval/Treat Not Completed: Patient at procedure or test/ unavailable: Pt in room working with SLP. Will return as able   Theodoro Clock 02/21/2021, 2:28 PM

## 2021-02-21 NOTE — Progress Notes (Signed)
Physical Therapy Re-Evaluation Patient Details Name: Laurie [name] MRN: [id number] DOB: September 08, 1955 Today's Date: 02/21/2021   History of Present Illness Laurie [name] is a 65 y.o. female who presented to the ED 02/06/21 with abdominal pain, CT revealed an incarcerated ventral hernia. Pt procedures: 02/06/21 small bowell resection, hernia repair and lysis of adhesions, 11/4 wound vac placement, 11/5 drain placement for anterior peirtoneal abcess. On 11/14 Neuroimaging for encephalopathy showed R PCA subacute stroke with petechial hemorrhage and acute punctate cortical ischemic strokes in both hemispheres. PMH significant for  T2DM, HTN, HLD, anxiety/depression, GERD, obesity and multiple abdominopelvic surgeries.     PT Comments    Patient re-evaluated today due to subacute CVA noted on MRI on 11/14. Pt alert and oriented x4 during session and followed commands appropriately and able to sequence bed mobility, transfers, and gait with min guard for safety. Pt ambulated short distance to bathroom and was able to complete pericare without assist. Pt c/o nausea and woozy sensation while ambulating back to bed, symptoms resolved with return to sitting EOB. EOS pt instructed in bed level exercises for hip strengthening to continue strengthening. Acute PT will continue to progress pt as able throughout stay. At this time recommending pt follow up with HHPT and anticipate she could transition to OPPT quickly with support from family.     Recommendations for follow up therapy are one component of a multi-disciplinary discharge planning process, led by the attending physician.  Recommendations may be updated based on patient status, additional functional criteria and insurance authorization.  Follow Up Recommendations   HHPT vs OPPT     Assistance Recommended at Discharge Intermittent Supervision/Assistance  Equipment Recommendations  None recommended by PT    Recommendations for Other  Services       Precautions / Restrictions Precautions Precautions: Fall Precaution Comments: abdomen, wound vac and JP drain on R Restrictions Weight Bearing Restrictions: No     Mobility  Bed Mobility Overal bed mobility: Needs Assistance Bed Mobility: Rolling;Sit to Sidelying;Sidelying to Sit Rolling: Supervision Sidelying to sit: Min guard;HOB elevated     Sit to sidelying: Min assist    pt using bed rail and light assist/cues needed to complete log roll technique with supin<>sit.  Transfers Overall transfer level: Needs assistance Equipment used: Rolling walker (2 wheels) Transfers: Sit to/from Stand Sit to Stand: Min guard            guarding for safety wiht power up from EOB and toilet. pt using grab bar in bathroom to control lowering.    Ambulation/Gait Ambulation/Gait assistance: Min guard Gait Distance (Feet): 40 Feet Assistive device: Rolling walker (2 wheels) Gait Pattern/deviations: Decreased stride length;Narrow base of support;Step-through pattern;Shuffle;Trunk flexed Gait velocity: decr    pt ambulate to bathroom and back from EOB, no overt LOB and pt maintian safe proximity to RW throughout.     Stairs             Wheelchair Mobility    Modified Rankin (Stroke Patients Only)       Balance Overall balance assessment: Needs assistance Sitting-balance support: Feet supported Sitting balance-Leahy Scale: Good     Standing balance support: During functional activity;Bilateral upper extremity supported;Reliant on assistive device for balance Standing balance-Leahy Scale: Fair                              Cognition Arousal/Alertness: Awake/alert Behavior During Therapy: WFL for tasks assessed/performed Overall Cognitive Status: Within  Functional Limits for tasks assessed                                   Functional Status Assessment: Patient has had a recent decline in their functional status and  demonstrates the ability to make significant improvements in function in a reasonable and predictable amount of time.      Exercises General Exercises - Lower Extremity Hip ABduction/ADduction: AROM;Both;10 reps;Supine Straight Leg Raises: AROM;Both;10 reps;Supine    General Comments        Pertinent Vitals/Pain Pain Assessment: Faces Faces Pain Scale: Hurts a little bit Pain Location: headache and abdomen Pain Descriptors / Indicators: Aching Pain Intervention(s): Limited activity within patient's tolerance;Monitored during session;Repositioned    Home Living                          Prior Function            PT Goals (current goals can now be found in the care plan section) Acute Rehab PT Goals Patient Stated Goal: Less pain and go home PT Goal Formulation: With patient/family Time For Goal Achievement: 03/07/21 (extended) Potential to Achieve Goals: Good Progress towards PT goals: Progressing toward goals    Frequency    Min 3X/week      PT Plan Current plan remains appropriate    Co-evaluation              AM-PAC PT "6 Clicks" Mobility   Outcome Measure  Help needed turning from your back to your side while in a flat bed without using bedrails?: A Little Help needed moving from lying on your back to sitting on the side of a flat bed without using bedrails?: A Little Help needed moving to and from a bed to a chair (including a wheelchair)?: A Little Help needed standing up from a chair using your arms (e.g., wheelchair or bedside chair)?: A Little Help needed to walk in hospital room?: A Little Help needed climbing 3-5 steps with a railing? : A Lot 6 Click Score: 17    End of Session Equipment Utilized During Treatment: Gait belt Activity Tolerance: Patient tolerated treatment well Patient left: in bed;with call bell/phone within reach;with bed alarm set Nurse Communication: Mobility status PT Visit Diagnosis: Unsteadiness on feet  (R26.81);Pain     Time: 2633-3545 PT Time Calculation (min) (ACUTE ONLY): 27 min  Charges:  $Gait Training: 8-22 mins                     Wynn Maudlin, DPT Acute Rehabilitation Services Office [phone] Pager [phone]    Anitra Lauth 02/21/2021, 12:26 PM

## 2021-02-21 NOTE — Evaluation (Signed)
Speech Language Pathology Evaluation Patient Details Name: Laurie [name] MRN: [id number] DOB: 30-Aug-1955 Today's Date: 02/21/2021 Time: [id number] SLP Time Calculation (min) (ACUTE ONLY): 27 min  Problem List:  Patient Active Problem List   Diagnosis Date Noted   Acute renal failure (ARF) ([place]) 02/06/2021   Diabetes mellitus without complication (HCC)    Incarcerated ventral hernia    SBO (small bowel obstruction) (HCC)    Obesity    Esophageal obstruction due to food impaction    Esophageal foreign body, initial encounter    Food impaction of esophagus    AKI (acute kidney injury) ([place]) 07/01/2015   Cellulitis of buttock 06/24/2015   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 ([place]) 06/24/2015   CAD (coronary artery disease) 06/24/2015   Essential hypertension 06/24/2015   Asthma 06/24/2015   UTI (lower urinary tract infection) 06/24/2015   Candidal esophagitis ([place]) 06/24/2015   Perianal abscess 06/24/2015   Cellulitis of left buttock    Past Medical History:  Past Medical History:  Diagnosis Date   Anal fissure    Asthma    B12 deficiency    Candidal esophagitis (HCC)    Carpal tunnel syndrome    Chronic abdominal pain    Chronic UTI    CKD (chronic kidney disease)    Coronary artery disease    Diabetes mellitus without complication (HCC)    Diabetic gastropathy (HCC)    Episodic low back pain    GERD (gastroesophageal reflux disease)    GI bleed    Glaucoma    Hepatitis C    Hypertension    IBS (irritable bowel syndrome)    Interstitial cystitis    Iron deficiency anemia    Myofascial pain syndrome    Obesity (BMI 35.0-39.9 without comorbidity)    Plantar fasciitis    Past Surgical History:  Past Surgical History:  Procedure Laterality Date   ABDOMINAL HYSTERECTOMY     BILATERAL CARPAL TUNNEL RELEASE     BREAST SURGERY     CHOLECYSTECTOMY     COLONOSCOPY     ESOPHAGOGASTRODUODENOSCOPY     ESOPHAGOGASTRODUODENOSCOPY (EGD) WITH PROPOFOL N/A 08/22/2016    Procedure: ESOPHAGOGASTRODUODENOSCOPY (EGD) WITH PROPOFOL;  Surgeon: Milus Banister, MD;  Location: [facility];  Service: Endoscopy;  Laterality: N/A;   ESOPHAGOGASTRODUODENOSCOPY (EGD) WITH PROPOFOL N/A 02/11/2017   Procedure: ESOPHAGOGASTRODUODENOSCOPY (EGD) WITH PROPOFOL;  Surgeon: Jerene Bears, MD;  Location: [facility];  Service: Gastroenterology;  Laterality: N/A;   INCISION AND DRAINAGE ABSCESS Left 06/25/2015   Procedure: INCISION AND DRAINAGE ABSCESS;  Surgeon: Leighton Ruff, MD;  Location: [facility];  Service: General;  Laterality: Left;   LAPAROSCOPY N/A 02/06/2021   Procedure: EXPLORATORY LAPAROTOMY; LYSIS OF ADHESIONS; INCISIONAL HERNIA REPAIR,SMALL BOWEL RESECTION;  Surgeon: Kinsinger, Arta Bruce, MD;  Location: [facility];  Service: General;  Laterality: N/A;   REVISION UROSTOMY CUTANEOUS     HPI:  Laurie [name] is a 65 y.o. female originally admitted on 10/31 with abdominal pain. She was found to have incarcerated hernia, and has been recovering from this. She had wound cultures with positive E. coli and, urine culture with positive for E. coli and Klebsiella.  On Saturday, she was quite confused, responding to stimuli that were not there.  The patient states that she sees things on the walls that disappear.  She describes them as things like toys, or other formed items that she then realizes are not there.  Her family member called her on Saturday, and realized that she  was not making sense and therefore alerted nursing staff who activated a code stroke.  The patient was evaluated by teleneurology and an MRI was performed.  The MRI revealed two punctate areas of cortical infarct.  Since that time, the patient has significantly improved.  Her UTI is being treated with Zosyn.   Assessment / Plan / Recommendation Clinical Impression  Pt friendly, sitting upright in bed and cooperative.  SLUM ([employer] Mental Status Exam) administered with pt scoring 13/30. Per authors  scoring of 27-30 normal, 21/26 mild cog deficit, 1/20 dementia per their scoring key.  Areas of strengths included narrative recall, orientation to location and diagnosis. Difficulties noted in areas of memory- recall of words - most notably tasks that required working memory.  Minimal dysarthria noted, most especially with multisyllabic words - but she is intelligible.  No focal CN deficits apparent.  Pt reports family endorses speech changes. As pt is able to meet her needs, communicate effectively in current supportive environment, no acute SLP follow up indicated.  Recommend follow up at next venue of care.    SLP Assessment  SLP Recommendation/Assessment: Patient needs continued Speech Lanaguage Pathology Services SLP Visit Diagnosis: Cognitive communication deficit (R41.841);Dysarthria and anarthria (R47.1)    Recommendations for follow up therapy are one component of a multi-disciplinary discharge planning process, led by the attending physician.  Recommendations may be updated based on patient status, additional functional criteria and insurance authorization.    Follow Up Recommendations  Follow physician's recommendations for discharge plan and follow up therapies    Assistance Recommended at Discharge  Set up Supervision/Assistance  Functional Status Assessment Patient has had a recent decline in their functional status and demonstrates the ability to make significant improvements in function in a reasonable and predictable amount of time.  Frequency and Duration     N/a      SLP Evaluation Cognition  Overall Cognitive Status: Impaired/Different from baseline Arousal/Alertness: Awake/alert Orientation Level: Oriented to person;Oriented to place Year: 2022 Month: November Day of Week: Incorrect Attention: Selective;Sustained Sustained Attention: Impaired Sustained Attention Impairment: Verbal complex Selective Attention: Impaired Selective Attention Impairment: Verbal  complex Memory: Impaired Memory Impairment: Retrieval deficit;Storage deficit (recalled 1/5 words without cue, 3/5 with category cue, did not recall 1/5) Problem Solving: Impaired Problem Solving Impairment: Verbal complex (functional math problem solving) Safety/Judgment: Appears intact       Comprehension  Auditory Comprehension Overall Auditory Comprehension: Appears within functional limits for tasks assessed Yes/No Questions: Not tested Commands: Within Functional Limits Conversation: Complex EffectiveTechniques: Extra processing time;Repetition Visual Recognition/Discrimination Discrimination: Not tested Reading Comprehension Reading Status: Not tested    Expression Expression Primary Mode of Expression: Verbal Verbal Expression Overall Verbal Expression: Appears within functional limits for tasks assessed Initiation: No impairment Level of Generative/Spontaneous Verbalization: Conversation Repetition: Impaired Level of Impairment:  (multisyllabic due to dysarthria but not language deficits) Naming: Not tested Pragmatics: No impairment Written Expression Dominant Hand: Right Written Expression: Exceptions to [facility] (pt's name and address are legible but pt admits they are not normal)   Oral / Motor  Oral Motor/Sensory Function Overall Oral Motor/Sensory Function: Within functional limits Motor Speech Overall Motor Speech: Appears within functional limits for tasks assessed (pt endorses speech clarity changes) Respiration: Within functional limits Phonation: Normal Resonance: Within functional limits Articulation: Impaired (for multisyllabic words, complex only) Intelligibility: Intelligible Motor Speech Errors: Not applicable   GO                   Tammy  Raliegh Ip MS [facility] SLP Acute Rehab Services Office [phone] Pager [phone]  Macario Golds 02/21/2021, 10:43 AM

## 2021-02-21 NOTE — Progress Notes (Addendum)
15 Days Post-Op  Subjective: CC: Similar lower abdominal pain to yesterday that is worse on the right side. Some nausea this am. She reports that she ate half of her trays and a vanilla ensure until she was made npo. Her diet is now resumed. Has not had breakfast. BM yesterday. Passing flatus. Afebrile. WBC wnl. Vac changed by RN yesterday.   Objective: Vital signs in last 24 hours: Temp:  [97.8 F (36.6 C)-98.6 F (37 C)] 97.8 F (36.6 C) (11/15 0343) Pulse Rate:  [54-65] 65 (11/15 0343) Resp:  [20] 20 (11/15 0343) BP: (140-170)/(64-69) 140/64 (11/15 0343) SpO2:  [97 %-99 %] 97 % (11/15 0739) Last BM Date: 02/20/21  Intake/Output from previous day: 11/14 0701 - 11/15 0700 In: 500 [P.O.:350; IV Piggyback:150] Out: 2838 [Urine:2727; Drains:110; Stool:1] Intake/Output this shift: No intake/output data recorded.  PE: Gen:  Alert, NAD, pleasant Heart: Reg Pulm: CTA b/l. Rate and effort normal Abd: vac to midline with good seal. Thin yellow drainage in cannister. Abd soft, nondistended with lower abdominal tenderness greater on the right that is similar to yesterday. No rigidity or guarding. Drain output purulent Ext: MAE's  Lab Results:  Recent Labs    02/20/21 0509 02/21/21 0502  WBC 7.6 8.0  HGB 10.4* 10.9*  HCT 31.8* 33.7*  PLT 581* 579*   BMET Recent Labs    02/20/21 0509 02/21/21 0502  NA 143 143  K 3.6 3.1*  CL 116* 117*  CO2 17* 16*  GLUCOSE 106* 135*  BUN 16 12  CREATININE 1.09* 0.91  CALCIUM 9.1 9.1   PT/INR No results for input(s): LABPROT, INR in the last 72 hours. CMP     Component Value Date/Time   NA 143 02/21/2021 0502   K 3.1 (L) 02/21/2021 0502   CL 117 (H) 02/21/2021 0502   CO2 16 (L) 02/21/2021 0502   GLUCOSE 135 (H) 02/21/2021 0502   BUN 12 02/21/2021 0502   CREATININE 0.91 02/21/2021 0502   CALCIUM 9.1 02/21/2021 0502   PROT 7.2 02/21/2021 0502   ALBUMIN 2.9 (L) 02/21/2021 0502   AST 57 (H) 02/21/2021 0502   ALT 67 (H)  02/21/2021 0502   ALKPHOS 78 02/21/2021 0502   BILITOT 0.6 02/21/2021 0502   GFRNONAA >60 02/21/2021 0502   GFRAA 33 (L) 07/01/2015 0352   Lipase     Component Value Date/Time   LIPASE 34 02/06/2021 1315    Studies/Results: MR ANGIO HEAD WO CONTRAST  Result Date: 02/20/2021 CLINICAL DATA:  Stroke follow-up EXAM: MRA HEAD WITHOUT CONTRAST TECHNIQUE: Angiographic images of the Circle of Willis were acquired using MRA technique without intravenous contrast. COMPARISON:  Two days ago FINDINGS: Anterior circulation: Luminal irregularity of the carotid siphons with luminal narrowing at the anterior genu on the right that is moderate and likely accentuated by flow artifact and mild motion. Mild atheromatous irregularity of MCA branches. Negative for aneurysm or beading. Posterior circulation: Left dominant. No branch occlusion, beading, or aneurysm. IMPRESSION: 1. No emergent finding. 2. Irregularity of the carotid siphons attributed to atherosclerosis with up to moderate narrowing on the right. Electronically Signed   By: Tiburcio Pea M.D.   On: 02/20/2021 11:46   VAS US CAROTID  Result Date: 02/21/2021 Carotid Arterial Duplex Study Patient Name:  Laurie [name]  Date of Exam:   02/21/2021 Medical Rec #: [id number]            Accession #:    [id number] Date of Birth: 08/01/1955  Patient Gender: F Patient Age:   65 years Exam Location:  [facility] Procedure:      VAS US CAROTID Referring Phys: MCNEILL KIRKPATRICK --------------------------------------------------------------------------------  Indications:       CVA. Risk Factors:      Hypertension. Limitations        Today's exam was limited due to the patient's respiratory                    variation and patient positioning. Comparison Study:  No prior studies. Performing Technologist: Chanda Busing RVT  Examination Guidelines: A complete evaluation includes B-mode imaging, spectral Doppler, color Doppler, and power  Doppler as needed of all accessible portions of each vessel. Bilateral testing is considered an integral part of a complete examination. Limited examinations for reoccurring indications may be performed as noted.  Right Carotid Findings: +----------+--------+--------+--------+-----------------------+--------+           PSV cm/sEDV cm/sStenosisPlaque Description     Comments +----------+--------+--------+--------+-----------------------+--------+ CCA Prox  88      9               smooth and heterogenoustortuous +----------+--------+--------+--------+-----------------------+--------+ CCA Distal45      7               smooth and heterogenous         +----------+--------+--------+--------+-----------------------+--------+ ICA Prox  39      8               smooth and heterogenous         +----------+--------+--------+--------+-----------------------+--------+ ICA Distal49      12                                     tortuous +----------+--------+--------+--------+-----------------------+--------+ ECA       73      6                                               +----------+--------+--------+--------+-----------------------+--------+ +----------+--------+-------+--------+-------------------+           PSV cm/sEDV cmsDescribeArm Pressure (mmHG) +----------+--------+-------+--------+-------------------+ Subclavian115                                        +----------+--------+-------+--------+-------------------+ +---------+--------+--+--------+-+---------+ VertebralPSV cm/s48EDV cm/s7Antegrade +---------+--------+--+--------+-+---------+  Left Carotid Findings: +----------+--------+--------+--------+-----------------------+--------+           PSV cm/sEDV cm/sStenosisPlaque Description     Comments +----------+--------+--------+--------+-----------------------+--------+ CCA Prox  92      14              smooth and heterogenous          +----------+--------+--------+--------+-----------------------+--------+ CCA Distal47      7               smooth and heterogenous         +----------+--------+--------+--------+-----------------------+--------+ ICA Prox  37      8               smooth and heterogenous         +----------+--------+--------+--------+-----------------------+--------+ ICA Distal49      8  tortuous +----------+--------+--------+--------+-----------------------+--------+ ECA       53      6                                               +----------+--------+--------+--------+-----------------------+--------+ +----------+--------+--------+--------+-------------------+           PSV cm/sEDV cm/sDescribeArm Pressure (mmHG) +----------+--------+--------+--------+-------------------+ [id number]                                         +----------+--------+--------+--------+-------------------+ +---------+--------+--+--------+-+---------+ VertebralPSV cm/s44EDV cm/s8Antegrade +---------+--------+--+--------+-+---------+   Summary: Right Carotid: Velocities in the right ICA are consistent with a 1-39% stenosis. Left Carotid: Velocities in the left ICA are consistent with a 1-39% stenosis. Vertebrals: Bilateral vertebral arteries demonstrate antegrade flow. *See table(s) above for measurements and observations.     Preliminary     Anti-infectives: Anti-infectives (From admission, onward)    Start     Dose/Rate Route Frequency Ordered Stop   02/12/21 1200  piperacillin-tazobactam (ZOSYN) IVPB 3.375 g        3.375 g 12.5 mL/hr over 240 Minutes Intravenous Every 8 hours 02/12/21 0950     02/06/21 2003  ceFAZolin (ANCEF) 2-4 GM/100ML-% IVPB       Note to Pharmacy: Ponciano Ort   : cabinet override      02/06/21 2003 02/06/21 2046   02/06/21 1945  ceFAZolin (ANCEF) IVPB 2g/100 mL premix        2 g 200 mL/hr over 30 Minutes Intravenous On call to O.R.  02/06/21 1914 02/06/21 2028        Assessment/Plan Ventral hernia with SBO POD#15 S/P ventral hernia repair with small bowel resection, LOA 10/31 Dr. Sheliah Hatch - s/p IR drain 11/5, culture with somewhat resistant E coli but sensitive to zosyn - continue  - CT 11/10 showed resolution of drained fluid collection with drain in place and a few dots of residual air, no new concerning findings. No definite enterocutaneous fistula but is high risk. Keep drain in place, will need follow up in IR with drain injection prior to considering removal. - Continue drain and monitor output - Wound vac MWF - plan to see on Wed - PT/OT, encourage mobilization - previously recommending HH PT. Needs reassessment after CVA - Encourage PO intake and continue bowel regimen - Pulm toilet. 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 02/21/2021, 10:33 AM Please see Amion for pager number during day hours 7:00am-4:30pm

## 2021-02-22 DIAGNOSIS — R1011 Right upper quadrant pain: Secondary | ICD-10-CM

## 2021-02-22 DIAGNOSIS — R4182 Altered mental status, unspecified: Secondary | ICD-10-CM

## 2021-02-22 LAB — CBC WITH DIFFERENTIAL/PLATELET
Abs Immature Granulocytes: 0.07 10*3/uL (ref 0.00–0.07)
Basophils Absolute: 0 10*3/uL (ref 0.0–0.1)
Basophils Relative: 0 %
Eosinophils Absolute: 0.2 10*3/uL (ref 0.0–0.5)
Eosinophils Relative: 2 %
HCT: 35.1 % — ABNORMAL LOW (ref 36.0–46.0)
Hemoglobin: 11.5 g/dL — ABNORMAL LOW (ref 12.0–15.0)
Immature Granulocytes: 1 %
Lymphocytes Relative: 16 %
Lymphs Abs: 1.5 10*3/uL (ref 0.7–4.0)
MCH: 32.1 pg (ref 26.0–34.0)
MCHC: 32.8 g/dL (ref 30.0–36.0)
MCV: 98 fL (ref 80.0–100.0)
Monocytes Absolute: 1.1 10*3/uL — ABNORMAL HIGH (ref 0.1–1.0)
Monocytes Relative: 13 %
Neutro Abs: 6.2 10*3/uL (ref 1.7–7.7)
Neutrophils Relative %: 68 %
Platelets: 627 10*3/uL — ABNORMAL HIGH (ref 150–400)
RBC: 3.58 MIL/uL — ABNORMAL LOW (ref 3.87–5.11)
RDW: 14.8 % (ref 11.5–15.5)
WBC: 9.1 10*3/uL (ref 4.0–10.5)
nRBC: 0 % (ref 0.0–0.2)

## 2021-02-22 LAB — GLUCOSE, CAPILLARY
Glucose-Capillary: 126 mg/dL — ABNORMAL HIGH (ref 70–99)
Glucose-Capillary: 183 mg/dL — ABNORMAL HIGH (ref 70–99)
Glucose-Capillary: 202 mg/dL — ABNORMAL HIGH (ref 70–99)
Glucose-Capillary: 151 mg/dL — ABNORMAL HIGH (ref 70–99)

## 2021-02-22 LAB — COMPREHENSIVE METABOLIC PANEL
ALT: 63 U/L — ABNORMAL HIGH (ref 0–44)
AST: 45 U/L — ABNORMAL HIGH (ref 15–41)
Albumin: 3.1 g/dL — ABNORMAL LOW (ref 3.5–5.0)
Alkaline Phosphatase: 82 U/L (ref 38–126)
Anion gap: 10 (ref 5–15)
BUN: 10 mg/dL (ref 8–23)
CO2: 19 mmol/L — ABNORMAL LOW (ref 22–32)
Calcium: 9.1 mg/dL (ref 8.9–10.3)
Chloride: 113 mmol/L — ABNORMAL HIGH (ref 98–111)
Creatinine, Ser: 1.03 mg/dL — ABNORMAL HIGH (ref 0.44–1.00)
GFR, Estimated: 60 mL/min — ABNORMAL LOW (ref >60)
Glucose, Bld: 139 mg/dL — ABNORMAL HIGH (ref 70–99)
Potassium: 3.2 mmol/L — ABNORMAL LOW (ref 3.5–5.1)
Sodium: 142 mmol/L (ref 135–145)
Total Bilirubin: 0.6 mg/dL (ref 0.3–1.2)
Total Protein: 7.5 g/dL (ref 6.5–8.1)

## 2021-02-22 MED ORDER — POTASSIUM CHLORIDE CRYS ER 20 MEQ PO TBCR
40.0000 meq | EXTENDED_RELEASE_TABLET | ORAL | Status: AC
  Administered 2021-02-22 (×2): 40 meq via ORAL
  Filled 2021-02-22 (×2): qty 2

## 2021-02-22 MED ORDER — PROSOURCE PLUS PO LIQD
30.0000 mL | Freq: Two times a day (BID) | ORAL | Status: DC
  Administered 2021-02-22 – 2021-02-25 (×5): 30 mL via ORAL
  Filled 2021-02-22 (×6): qty 30

## 2021-02-22 NOTE — Progress Notes (Signed)
PROGRESS NOTE    Laurie [name]  [id number] DOB: 1956/01/12 DOA: 02/06/2021 PCP: Cindra Eves, MD    Brief NarrativeCV:[id number] w/ a hx of DM2, HTN, HLD, anxiety/depression, GERD, obesity, and multiple abdomino-pelvic surgeries who presented to the ED with 5 days of worsening lower abdominal pain associated with loose stools and vomiting. In the ED CT revealed an incarcerated ventral hernia. EDP attempted reduction though symptoms did not improve. Labs notable for acute renal failure in the setting of dehydration. She was transferred to [facility] ED for surgical consult, and repeat CT abdomen showed persistent hernia with bowel obstruction for which she was taken to the OR.  Assessment & Plan:   Active Problems:   CAD (coronary artery disease)   Essential hypertension   Acute renal failure (ARF) (HCC)   Diabetes mellitus without complication (HCC)   Incarcerated ventral hernia   SBO (small bowel obstruction) (HCC)   Obesity   Malnutrition of moderate degree  Incarcerated ventral hernia with SBO Anterior peirtoneal abscess Currently afebrile with resolved leukocytosis Status post repair with small bowel resection and LOA 10/31 Persisting abdominal discomfort and climbing WBC raised concern leading to CT abdomen pelvis 11/4 which noted questionable fluid collection within the abdomen which was drained by IR on 11/5 Continue IV Zosyn as per gen surgery Per General Surgery, IR to evaluate drain given minimal output   Acute renal failure- improved Metabolic acidosis Acute urinary retention- resolved (pt self cath) Cr peaked to 1.34, now improved In and out prn (pt reports self cath about 3 times a day due to urinary diversion), informed RN to ensure its done    E.coli UTI Urine culture >100,000 E.coli, 40,000 klebsiella pneumoniae  Continued on IV zosyn   Hypokalemia Will replace Daily CMP   Acute CVA MRI showed acute ischemic nonhemorrhagic cortical infarct cortical  infarct, evolving late subacute right PCA territory infarct involving the right occipital lobe, associated petechial blood products without frank intraparenchymal hematoma or significant mass-effect MRA head and neck no emergent findings Echo with EF 65 to [id number], grade 1 diastolic dysfunction, bubble study negative with no evidence of inter-atrial shunt LDL, 97, A1c--> 6.2 Start ASA, continue home crestor PT/OT Telemetry Neurology consulted- suspicious of embolic in nature, rec further work up Pending TEE, outpt cardiac monitoring (cardiology noted to have been notified) Pt dizzy today. 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 Terance Hart at [facility] - history of cystectomy and right colon pouch for interstitial cystitis 1990s - pouch stone is found to be nonobstructing   DM2 Continue with SSI, accuchecks   HTN BP stable   COPD Stable Continue usual Dulera and as needed albuterol   GERD PPI   Obesity - Body mass index is 34.33 kg/m. Lifestyle modification advised   DVT prophylaxis: Lovenox subq Code Status: Full Family Communication: Pt in room, family not at bedside  Status is: Inpatient  Remains inpatient appropriate because: Severity of illness   Consultants:  General Surgery  IR  Procedures:  10/31 ventral hernia repair with small bowel resection and LOA 11/5 CT-guided drainage of anterior peritoneal abscess in IR -10 French drain left in place none  Antimicrobials: Anti-infectives (From admission, onward)    Start  Dose/Rate Route Frequency Ordered Stop   02/12/21 1200  piperacillin-tazobactam (ZOSYN) IVPB 3.375 g        3.375 g 12.5 mL/hr over 240  Minutes Intravenous Every 8 hours 02/12/21 0950     02/06/21 2003  ceFAZolin (ANCEF) 2-4 GM/100ML-% IVPB       Note to Pharmacy: Ponciano Ort   : cabinet override      02/06/21 2003 02/06/21 2046   02/06/21 1945  ceFAZolin (ANCEF) IVPB 2g/100 mL premix        2 g 200 mL/hr over 30 Minutes Intravenous On call to O.R. 02/06/21 1914 02/06/21 2028       Subjective: Complains of mild abd discomfort  Objective: Vitals:   02/21/21 1958 02/21/21 2119 02/22/21 0312 02/22/21 0820  BP:  (!) 152/71 (!) 166/80   Pulse:  74 62   Resp:  20 18   Temp:  98.3 F (36.8 C) 98 F (36.7 C)   TempSrc:  Axillary Oral   SpO2: 100% 98% 99% 94%  Weight:      Height:        Intake/Output Summary (Last 24 hours) at 02/22/2021 1520 Last data filed at 02/22/2021 1000 Gross per 24 hour  Intake 260 ml  Output 460 ml  Net -200 ml   Filed Weights   02/06/21 1053  Weight: 90.7 kg    Examination: General exam: Awake, laying in bed, in nad Respiratory system: Normal respiratory effort, no wheezing Cardiovascular system: regular rate, s1, s2 Gastrointestinal system: Soft, nondistended, positive BS Central nervous system: CN2-12 grossly intact, strength intact Extremities: Perfused, no clubbing Skin: Normal skin turgor, no notable skin lesions seen Psychiatry: Mood normal // no visual hallucinations   Data Reviewed: I have personally reviewed following labs and imaging studies  CBC: Recent Labs  Lab 02/18/21 1204 02/19/21 0144 02/20/21 0509 02/21/21 0502 02/22/21 0517  WBC 11.3* 10.9* 7.6 8.0 9.1  NEUTROABS 9.2* 7.3 4.9 5.6 6.2  HGB 11.8* 10.8* 10.4* 10.9* 11.5*  HCT 35.9* 33.3* 31.8* 33.7* 35.1*  MCV 98.6 99.4 98.5 99.7 98.0  PLT 580* 548* 581* 579* 627*   Basic Metabolic Panel: Recent Labs  Lab 02/18/21 1136 02/19/21 0144 02/20/21 0509 02/21/21 0502 02/22/21 0517  NA 138 142 143 143 142  K 3.5 3.3* 3.6 3.1* 3.2*  CL 112* 114* 116* 117* 113*  CO2 15* 17* 17* 16* 19*  GLUCOSE  157* 128* 106* 135* 139*  BUN 19 20 16 12 10   CREATININE 1.34* 1.32* 1.09* 0.91 1.03*  CALCIUM 9.4 9.3 9.1 9.1 9.1  MG 1.9  --   --  1.8  --    GFR: Estimated Creatinine Clearance: 58.6 mL/min (A) (by C-G formula based on SCr of 1.03 mg/dL (H)). Liver Function Tests: Recent Labs  Lab 02/18/21 1136 02/19/21 0144 02/20/21 0509 02/21/21 0502 02/22/21 0517  AST 65* 78* 73* 57* 45*  ALT 57* 68* 72* 67* 63*  ALKPHOS 86 75 74 78 82  BILITOT 0.6 0.6 0.5 0.6 0.6  PROT 8.1 7.4 7.0 7.2 7.5  ALBUMIN 3.1* 2.8* 2.7* 2.9* 3.1*   No results for input(s): LIPASE, AMYLASE in the last 168 hours. No results for input(s): AMMONIA in the last 168 hours. Coagulation Profile: No results for input(s): INR, PROTIME in the last 168 hours. Cardiac Enzymes: No results for input(s): CKTOTAL, CKMB, CKMBINDEX, TROPONINI in the last 168 hours. BNP (last 3 results) No results for input(s): PROBNP in the last 8760 hours. HbA1C: No results for input(s):  HGBA1C in the last 72 hours. CBG: Recent Labs  Lab 02/21/21 1210 02/21/21 1719 02/21/21 2121 02/22/21 0806 02/22/21 1142  GLUCAP 123* 194* 161* 126* 183*   Lipid Profile: No results for input(s): CHOL, HDL, LDLCALC, TRIG, CHOLHDL, LDLDIRECT in the last 72 hours. Thyroid Function Tests: No results for input(s): TSH, T4TOTAL, FREET4, T3FREE, THYROIDAB in the last 72 hours. Anemia Panel: No results for input(s): VITAMINB12, FOLATE, FERRITIN, TIBC, IRON, RETICCTPCT in the last 72 hours. Sepsis Labs: No results for input(s): PROCALCITON, LATICACIDVEN in the last 168 hours.  Recent Results (from the past 240 hour(s))  Culture, blood (routine x 2)     Status: None (Preliminary result)   Collection Time: 02/18/21 12:04 PM   Specimen: BLOOD  Result Value Ref Range Status   Specimen Description   Final    BLOOD RIGHT ANTECUBITAL Performed at [facility] [address] [place], [place] [address]    Special Requests   Final     BOTTLES DRAWN AEROBIC ONLY Blood Culture adequate volume Performed at [facility] [address], [place], [place] [address]    Culture   Final    NO GROWTH 4 DAYS Performed at [facility], [place] [address] [place], [place] [address]    Report Status PENDING  Incomplete  Culture, blood (routine x 2)     Status: None (Preliminary result)   Collection Time: 02/18/21 12:04 PM   Specimen: BLOOD LEFT HAND  Result Value Ref Range Status   Specimen Description   Final    BLOOD LEFT HAND Performed at [facility] [address], [place], [place] [address]    Special Requests   Final    BOTTLES DRAWN AEROBIC ONLY Blood Culture results may not be optimal due to an inadequate volume of blood received in culture bottles Performed at [facility] [address], [place], [place] [address]    Culture   Final    NO GROWTH 4 DAYS Performed at [facility], [place] [address] [place], [place] [address]    Report Status PENDING  Incomplete  Urine Culture     Status: Abnormal   Collection Time: 02/18/21 12:48 PM   Specimen: In/Out Cath Urine  Result Value Ref Range Status   Specimen Description   Final    IN/OUT CATH URINE Performed at [facility] [address] [place], [place] [address]    Special Requests   Final    NONE Performed at [facility], [place] [address] [place], [place] [address]    Culture (A)  Final    40,000 COLONIES/mL KLEBSIELLA PNEUMONIAE >=100,000 COLONIES/mL ESCHERICHIA COLI    Report Status 02/21/2021 FINAL  Final   Organism ID, Bacteria KLEBSIELLA PNEUMONIAE (A)  Final   Organism ID, Bacteria ESCHERICHIA COLI (A)  Final      Susceptibility   Escherichia coli - MIC*    AMPICILLIN 8 SENSITIVE Sensitive     CEFAZOLIN <=4 SENSITIVE Sensitive     CEFEPIME <=0.12 SENSITIVE Sensitive     CEFTRIAXONE <=0.25 SENSITIVE Sensitive     CIPROFLOXACIN <=0.25 SENSITIVE  Sensitive     GENTAMICIN <=1 SENSITIVE Sensitive     IMIPENEM <=0.25 SENSITIVE Sensitive     NITROFURANTOIN <=16 SENSITIVE Sensitive     TRIMETH/SULFA <=20 SENSITIVE Sensitive     AMPICILLIN/SULBACTAM <=2 SENSITIVE Sensitive     PIP/TAZO <=4 SENSITIVE Sensitive     * >=100,000 COLONIES/mL  ESCHERICHIA COLI   Klebsiella pneumoniae - MIC*    AMPICILLIN >=32 RESISTANT Resistant     CEFAZOLIN <=4 SENSITIVE Sensitive     CEFEPIME <=0.12 SENSITIVE Sensitive     CEFTRIAXONE <=0.25 SENSITIVE Sensitive     CIPROFLOXACIN <=0.25 SENSITIVE Sensitive     GENTAMICIN <=1 SENSITIVE Sensitive     IMIPENEM <=0.25 SENSITIVE Sensitive     NITROFURANTOIN 64 INTERMEDIATE Intermediate     TRIMETH/SULFA <=20 SENSITIVE Sensitive     AMPICILLIN/SULBACTAM 8 SENSITIVE Sensitive     PIP/TAZO <=4 SENSITIVE Sensitive     * 40,000 COLONIES/mL KLEBSIELLA PNEUMONIAE     Radiology Studies: VAS US CAROTID  Result Date: 02/22/2021 Carotid Arterial Duplex Study Patient Name:  Laurie [name]  Date of Exam:   02/21/2021 Medical Rec #: [id number]            Accession #:    [id number] Date of Birth: 07-22-1955            Patient Gender: F Patient Age:   9 years Exam Location:  [facility] Procedure:      VAS US CAROTID Referring Phys: MCNEILL KIRKPATRICK --------------------------------------------------------------------------------  Indications:       CVA. Risk Factors:      Hypertension. Limitations        Today's exam was limited due to the patient's respiratory                    variation and patient positioning. Comparison Study:  No prior studies. Performing Technologist: Oliver Hum RVT  Examination Guidelines: A complete evaluation includes B-mode imaging, spectral Doppler, color Doppler, and power Doppler as needed of all accessible portions of each vessel. Bilateral testing is considered an integral part of a complete examination. Limited examinations for reoccurring indications may be performed as  noted.  Right Carotid Findings: +----------+--------+--------+--------+-----------------------+--------+           PSV cm/sEDV cm/sStenosisPlaque Description     Comments +----------+--------+--------+--------+-----------------------+--------+ CCA Prox  88      9               smooth and heterogenoustortuous +----------+--------+--------+--------+-----------------------+--------+ CCA Distal45      7               smooth and heterogenous         +----------+--------+--------+--------+-----------------------+--------+ ICA Prox  39      8               smooth and heterogenous         +----------+--------+--------+--------+-----------------------+--------+ ICA Distal49      12                                     tortuous +----------+--------+--------+--------+-----------------------+--------+ ECA       73      6                                               +----------+--------+--------+--------+-----------------------+--------+ +----------+--------+-------+--------+-------------------+           PSV cm/sEDV cmsDescribeArm Pressure (mmHG) +----------+--------+-------+--------+-------------------+ [id number]                                        +----------+--------+-------+--------+-------------------+ +---------+--------+--+--------+-+---------+  VertebralPSV cm/s48EDV cm/s7Antegrade +---------+--------+--+--------+-+---------+  Left Carotid Findings: +----------+--------+--------+--------+-----------------------+--------+           PSV cm/sEDV cm/sStenosisPlaque Description     Comments +----------+--------+--------+--------+-----------------------+--------+ CCA Prox  92      14              smooth and heterogenous         +----------+--------+--------+--------+-----------------------+--------+ CCA Distal47      7               smooth and heterogenous         +----------+--------+--------+--------+-----------------------+--------+  ICA Prox  37      8               smooth and heterogenous         +----------+--------+--------+--------+-----------------------+--------+ ICA Distal49      8                                      tortuous +----------+--------+--------+--------+-----------------------+--------+ ECA       53      6                                               +----------+--------+--------+--------+-----------------------+--------+ +----------+--------+--------+--------+-------------------+           PSV cm/sEDV cm/sDescribeArm Pressure (mmHG) +----------+--------+--------+--------+-------------------+ [id number]                                         +----------+--------+--------+--------+-------------------+ +---------+--------+--+--------+-+---------+ VertebralPSV cm/s44EDV cm/s8Antegrade +---------+--------+--+--------+-+---------+   Summary: Right Carotid: Velocities in the right ICA are consistent with a 1-39% stenosis. Left Carotid: Velocities in the left ICA are consistent with a 1-39% stenosis. Vertebrals: Bilateral vertebral arteries demonstrate antegrade flow. *See table(s) above for measurements and observations.  Electronically signed by Antony Contras MD on 02/22/2021 at 8:22:23 AM.    Final     Scheduled Meds:  (feeding supplement) PROSource Plus  30 mL Oral BID BM   aspirin EC  81 mg Oral Daily   enoxaparin (LOVENOX) injection  40 mg Subcutaneous Q24H   feeding supplement  237 mL Oral BID BM   insulin aspart  0-5 Units Subcutaneous QHS   insulin aspart  0-9 Units Subcutaneous TID WC   melatonin  3 mg Oral QHS   methocarbamol  500 mg Oral Q6H   metoprolol succinate  50 mg Oral Daily   mometasone-formoterol  2 puff Inhalation BID   multivitamin with minerals  1 tablet Oral Daily   pantoprazole  40 mg Oral Daily   polycarbophil  625 mg Oral BID   polyethylene glycol  17 g Oral Daily   potassium chloride  40 mEq Oral Q4H   QUEtiapine  12.5 mg Oral QHS    rosuvastatin  10 mg Oral Daily   saccharomyces boulardii  250 mg Oral BID   sodium chloride flush  3 mL Intravenous Q12H   sodium chloride flush  5 mL Intracatheter Daily   Continuous Infusions:  piperacillin-tazobactam (ZOSYN)  IV 3.375 g (02/22/21 1352)     LOS: 15 days   Marylu Lund, MD [facility] Pager On Amion  If 7PM-7AM, please contact night-coverage 02/22/2021, 3:20 PM

## 2021-02-22 NOTE — Progress Notes (Signed)
Referring Physician(s): Gross,S  Supervising Physician: Ruel FavorsShick, Trevor  [facility]  [facility]  Chief Complaint: Abdominal pain, anterior abdominal abscess   Subjective: Pt sitting on BSC; still with some mid abd discomfort; occ balance issues with rising, some ocular floaters noted   Allergies: Buchu-cornsilk-ch grass-hydran, Celebrex [celecoxib], Ciprofloxacin, Cymbalta [duloxetine hcl], Dilaudid [hydromorphone hcl], Fentanyl and related, Glucophage [metformin hcl], Oxycodone, Pregabalin, Hydrocodone-acetaminophen, Morphine and related, Sulfa antibiotics, and Vicodin [hydrocodone-acetaminophen]  Medications: Prior to Admission medications   Medication Sig Start Date End Date Taking? Authorizing Provider  albuterol (PROVENTIL HFA;VENTOLIN HFA) 108 (90 BASE) MCG/ACT inhaler Inhale 2 puffs into the lungs every 4 (four) hours as needed for wheezing or shortness of breath.   Yes [provider]  amLODipine (NORVASC) 10 MG tablet Take 10 mg by mouth daily.    Yes [provider]  aspirin EC 81 MG tablet Take 81 mg by mouth daily.   Yes [provider]  budesonide-formoterol (SYMBICORT) 80-4.5 MCG/ACT inhaler Inhale 2 puffs into the lungs 2 (two) times daily.   Yes [provider]  cholecalciferol (VITAMIN D) 1000 UNITS tablet Take 1,000 Units by mouth daily.   Yes [provider]  cyanocobalamin 1000 MCG tablet Take 1,000 mcg by mouth daily.    Yes [provider]  dicyclomine (BENTYL) 20 MG tablet Take 20 mg 2 (two) times daily by mouth. 08/30/16 02/07/21 Yes [provider]  FLUoxetine (PROZAC) 20 MG capsule Take 20 mg by mouth daily.    Yes [provider]  fluticasone (FLONASE) 50 MCG/ACT nasal spray Place 2 sprays into both nostrils daily as needed for allergies or rhinitis.   Yes [provider]  glucagon 1 MG injection Inject 1 mg into the vein once as needed (severe insulin reaction).   Yes  [provider]  hydrocortisone (ANUSOL-HC) 2.5 % rectal cream Place 1 application rectally 3 (three) times daily as needed for hemorrhoids.    Yes [provider]  insulin glargine (LANTUS) 100 UNIT/ML injection Inject 50 Units into the skin at bedtime.   Yes [provider]  isosorbide mononitrate (IMDUR) 60 MG 24 hr tablet Take 60 mg by mouth daily.   Yes [provider]  lidocaine (XYLOCAINE) 5 % ointment Apply 1 application topically 4 (four) times daily as needed for moderate pain.   Yes [provider]  LIRAGLUTIDE [place] Inject 1.8 mg into the skin daily.   Yes [provider]  lisinopril (ZESTRIL) 5 MG tablet Take 5 mg by mouth daily. 08/02/16  Yes [provider]  loperamide (IMODIUM) 2 MG capsule Take 4 mg by mouth daily as needed for diarrhea or loose stools.   Yes [provider]  magnesium oxide (MAG-OX) 400 MG tablet Take 400 mg by mouth 2 (two) times daily.   Yes [provider]  metFORMIN (GLUCOPHAGE-XR) 500 MG 24 hr tablet Take 500 mg by mouth daily with breakfast.   Yes [provider]  metoprolol succinate (TOPROL-XL) 100 MG 24 hr tablet Take 100 mg by mouth daily. Take with or immediately following a meal.   Yes [provider]  Multiple Vitamin (MULTIVITAMIN WITH MINERALS) TABS tablet Take 1 tablet by mouth daily.   Yes [provider]  nitroGLYCERIN (NITROSTAT) 0.4 MG SL tablet Place 0.4 mg under the tongue every 5 (five) minutes as needed for chest pain.   Yes [provider]  Nitroglycerin 0.4 % OINT Place 1 application rectally 2 (two) times daily  as needed (for chest pain.).    Yes [provider]  pantoprazole (PROTONIX) 20 MG tablet Take 20 mg by mouth daily.   Yes [provider]  promethazine (PHENERGAN) 25 MG tablet Take 25 mg by mouth every 6 (six) hours as needed for nausea or vomiting.   Yes [provider]  ranolazine (RANEXA)  500 MG 12 hr tablet Take 500 mg by mouth 2 (two) times daily.   Yes [provider]  rosuvastatin (CRESTOR) 10 MG tablet Take 10 mg by mouth daily.   Yes [provider]  temazepam (RESTORIL) 7.5 MG capsule Take 7.5 mg by mouth at bedtime as needed for sleep.   Yes [provider]  traMADol (ULTRAM) 50 MG tablet Take 1 tablet (50 mg total) every 6 (six) hours as needed by mouth. 02/15/17  Yes Bethel Born, PA-C  traZODone (DESYREL) 50 MG tablet Take 50 mg by mouth at bedtime.   Yes [provider]  triamcinolone cream (KENALOG) 0.5 % Apply 1 application topically 2 (two) times daily as needed (for skin irritation).    Yes [provider]  gabapentin (NEURONTIN) 300 MG capsule Take 600 mg by mouth 2 (two) times daily.    [provider]     Vital Signs: BP (!) 166/80 (BP Location: Left Arm)   Pulse 62   Temp 98 F (36.7 C) (Oral)   Resp 18   Ht 5\' 4"  (1.626 m)   Wt 200 lb (90.7 kg)   SpO2 94%   BMI 34.33 kg/m   Physical Exam awake, answering questions ok; ant abd drain intact, insertion site ok, mildly tender, minimal OP reported per nurse turbid beige colored fluid  Imaging: CT HEAD WO CONTRAST ( )  Addendum Date: 02/18/2021   ADDENDUM REPORT: 02/18/2021 20:43 ADDENDUM: These results were called by telephone at the time of interpretation on 02/18/2021 at 8:42 pm to provider 13/03/2021, who verbally acknowledged these results. Electronically Signed   By: Audrea Muscat M.D.   On: 02/18/2021 20:43   Result Date: 02/18/2021 CLINICAL DATA:  Mental status change, unknown cause EXAM: CT HEAD WITHOUT CONTRAST TECHNIQUE: Contiguous axial images were obtained from the base of the skull through the vertex without intravenous contrast. COMPARISON:  None. FINDINGS: Brain: There is hypodensity involving the RIGHT posterior parieto-occipital lobe cortex (series 2, image 19). No acute hemorrhage. No midline shift. Vascular: Vascular  calcifications involving the carotid siphons. Skull: Normal. Negative for fracture or focal lesion. Sinuses/Orbits: No acute finding. Other: None. IMPRESSION: 1. Hypodensity involving the RIGHT posterior parietooccipital lobe cortex is concerning for infarction. Recommend further dedicated evaluation with brain MRI. PRA system was activated on 02/18/2021 at 8:09 p.m. at time of interpretation. Addendum will be issued upon telephonic communication of results. Electronically Signed: By: 13/03/2021 M.D. On: 02/18/2021 20:15   MR ANGIO HEAD WO CONTRAST  Result Date: 02/20/2021 CLINICAL DATA:  Stroke follow-up EXAM: MRA HEAD WITHOUT CONTRAST TECHNIQUE: Angiographic images of the Circle of Willis were acquired using MRA technique without intravenous contrast. COMPARISON:  Two days ago FINDINGS: Anterior circulation: Luminal irregularity of the carotid siphons with luminal narrowing at the anterior genu on the right that is moderate and likely accentuated by flow artifact and mild motion. Mild atheromatous irregularity of MCA branches. Negative for aneurysm or beading. Posterior circulation: Left dominant. No branch occlusion, beading, or aneurysm. IMPRESSION: 1. No emergent finding. 2. Irregularity of the carotid siphons attributed to atherosclerosis with up to moderate narrowing on  the right. Electronically Signed   By: Jorje Guild M.D.   On: 02/20/2021 11:46   MR BRAIN WO CONTRAST  Result Date: 02/18/2021 CLINICAL DATA:  Follow-up examination for acute stroke. EXAM: MRI HEAD WITHOUT CONTRAST TECHNIQUE: Multiplanar, multiecho pulse sequences of the brain and surrounding structures were obtained without intravenous contrast. COMPARISON:  Prior head CT from earlier the same day. FINDINGS: Brain: Cerebral volume within normal limits for age. Patchy T2/FLAIR hyperintensity involving the periventricular deep white matter both cerebral hemispheres as well as the pons, most consistent with chronic small  vessel ischemic disease, mild to moderate in nature. Evolving right PCA territory infarct involving the parasagittal right occipital lobe is seen, late subacute in appearance with only mild residual diffusion abnormality seen. Associated petechial blood products without frank intraparenchymal hematoma or significant mass effect. Additional 7 mm acute ischemic cortical infarct seen involving the high right parietal lobe, postcentral gyrus (series 11, image 90). No associated edema or hemorrhage. Additional subtle 3 mm focus of diffusion abnormality noted at the high parasagittal left frontal lobe, likely an evolving subacute infarct (series 11, image 94). No associated hemorrhage or mass effect. No other evidence for acute or subacute ischemia. No other acute intracranial hemorrhage. No mass lesion, mass effect or midline shift. No hydrocephalus or extra-axial fluid collection. Pituitary gland suprasellar region within normal limits. Midline structures intact. Vascular: Major intracranial vascular flow voids are maintained. Skull and upper cervical spine: Of craniocervical junction within normal limits. Bone marrow signal intensity normal. No scalp soft tissue abnormality. Sinuses/Orbits: Globes and orbital soft tissues demonstrate no acute finding. Paranasal sinuses are largely clear. No mastoid effusion. Inner ear structures grossly normal. Other: None. IMPRESSION: 1. 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 2. Evolving late subacute right PCA territory infarct involving the parasagittal right occipital lobe. Associated petechial blood products without frank intraparenchymal hematoma or significant mass effect. 3. Additional 3 mm focus of diffusion abnormality involving the high parasagittal left frontal lobe, likely an evolving subacute infarct. No associated hemorrhage or mass effect. 4. Underlying mild to moderate chronic microvascular  ischemic disease. Electronically Signed   By: Jeannine Boga M.D.   On: 02/18/2021 23:25   ECHOCARDIOGRAM COMPLETE BUBBLE STUDY  Result Date: 02/19/2021    ECHOCARDIOGRAM REPORT   Patient Name:   CHELLA GASTON Whinery Date of Exam: 02/19/2021 Medical Rec #:  [id number]           Height:       64.0 in Accession #:    [id number]          Weight:       200.0 lb Date of Birth:  27-Jul-1955           BSA:          1.956 m Patient Age:    28 years            BP:           176/99 mmHg Patient Gender: F                   HR:           64 bpm. Exam Location:  Inpatient Procedure: 2D Echo, Cardiac Doppler, Color Doppler and Saline Contrast Bubble            Study Indications:    Stroke, bubble  History:        Patient has no prior history of Echocardiogram examinations.  CAD; Risk Factors:Hypertension and Diabetes.  Sonographer:    Glo Herring Referring Phys: [id number] [place]  1. Left ventricular ejection fraction, by estimation, is 65 to 70%. The left ventricle has normal function. The left ventricle has no regional wall motion abnormalities. Left ventricular diastolic parameters are consistent with Grade I diastolic dysfunction (impaired relaxation).  2. Right ventricular systolic function is normal. The right ventricular size is normal.  3. The mitral valve is grossly normal. Trivial mitral valve regurgitation.  4. The aortic valve was not well visualized. Aortic valve regurgitation is not visualized. Aortic valve mean gradient measures 6.0 mmHg.  5. Agitated saline contrast bubble study was negative, with no evidence of any interatrial shunt. Comparison(s): No prior Echocardiogram. FINDINGS  Left Ventricle: Left ventricular ejection fraction, by estimation, is 65 to 70%. The left ventricle has normal function. The left ventricle has no regional wall motion abnormalities. The left ventricular internal cavity size was normal in size. There is  no left ventricular hypertrophy. Left  ventricular diastolic parameters are consistent with Grade I diastolic dysfunction (impaired relaxation). Indeterminate filling pressures. Right Ventricle: The right ventricular size is normal. No increase in right ventricular wall thickness. Right ventricular systolic function is normal. Left Atrium: Left atrial size was normal in size. Right Atrium: Right atrial size was normal in size. Pericardium: There is no evidence of pericardial effusion. Mitral Valve: The mitral valve is grossly normal. Trivial mitral valve regurgitation. Tricuspid Valve: The tricuspid valve is grossly normal. Tricuspid valve regurgitation is trivial. Aortic Valve: The aortic valve was not well visualized. Aortic valve regurgitation is not visualized. Aortic valve mean gradient measures 6.0 mmHg. Aortic valve peak gradient measures 10.9 mmHg. Aortic valve area, by VTI measures 1.96 cm. Pulmonic Valve: The pulmonic valve was normal in structure. Pulmonic valve regurgitation is not visualized. Aorta: The aortic root and ascending aorta are structurally normal, with no evidence of dilitation. IAS/Shunts: No atrial level shunt detected by color flow Doppler. Agitated saline contrast was given intravenously to evaluate for intracardiac shunting. Agitated saline contrast bubble study was negative, with no evidence of any interatrial shunt.  LEFT VENTRICLE PLAX 2D LVIDd:         4.00 cm   Diastology LVIDs:         2.50 cm   LV e' medial:    7.40 cm/s LV PW:         1.00 cm   LV E/e' medial:  8.9 LV IVS:        1.00 cm   LV e' lateral:   9.46 cm/s LVOT diam:     1.90 cm   LV E/e' lateral: 7.0 LV SV:         73 LV SV Index:   37 LVOT Area:     2.84 cm  RIGHT VENTRICLE             IVC RV Basal diam:  3.25 cm     IVC diam: 1.60 cm RV S prime:     15.80 cm/s LEFT ATRIUM             Index        RIGHT ATRIUM           Index LA diam:        3.60 cm 1.84 cm/m   RA Area:     11.70 cm LA Vol (A2C):   48.8 ml 24.94 ml/m  RA Volume:   24.40 ml  12.47  ml/m LA  Vol (A4C):   45.1 ml 23.05 ml/m LA Biplane Vol: 49.2 ml 25.15 ml/m  AORTIC VALVE                     PULMONIC VALVE AV Area (Vmax):    2.20 cm      PV Vmax:       1.18 m/s AV Area (Vmean):   1.95 cm      PV Peak grad:  5.6 mmHg AV Area (VTI):     1.96 cm AV Vmax:           165.00 cm/s AV Vmean:          111.000 cm/s AV VTI:            0.373 m AV Peak Grad:      10.9 mmHg AV Mean Grad:      6.0 mmHg LVOT Vmax:         128.00 cm/s LVOT Vmean:        76.200 cm/s LVOT VTI:          0.258 m LVOT/AV VTI ratio: 0.69  AORTA Ao Root diam: 2.90 cm MITRAL VALVE MV Area (PHT): 3.48 cm    SHUNTS MV Decel Time: 218 msec    Systemic VTI:  0.26 m MV E velocity: 66.00 cm/s  Systemic Diam: 1.90 cm MV A velocity: 96.80 cm/s MV E/A ratio:  0.68 Lyman Bishop MD Electronically signed by Lyman Bishop MD Signature Date/Time: 02/19/2021/1:28:02 PM    Final    VAS US CAROTID  Result Date: 02/22/2021 Carotid Arterial Duplex Study Patient Name:  Laurie [name]  Date of Exam:   02/21/2021 Medical Rec #: [id number]            Accession #:    [id number] Date of Birth: Jul 01, 1955            Patient Gender: F Patient Age:   29 years Exam Location:  [facility] Procedure:      VAS US CAROTID Referring Phys: MCNEILL KIRKPATRICK --------------------------------------------------------------------------------  Indications:       CVA. Risk Factors:      Hypertension. Limitations        Today's exam was limited due to the patient's respiratory                    variation and patient positioning. Comparison Study:  No prior studies. Performing Technologist: Oliver Hum RVT  Examination Guidelines: A complete evaluation includes B-mode imaging, spectral Doppler, color Doppler, and power Doppler as needed of all accessible portions of each vessel. Bilateral testing is considered an integral part of a complete examination. Limited examinations for reoccurring indications may be performed as noted.  Right Carotid  Findings: +----------+--------+--------+--------+-----------------------+--------+           PSV cm/sEDV cm/sStenosisPlaque Description     Comments +----------+--------+--------+--------+-----------------------+--------+ CCA Prox  88      9               smooth and heterogenoustortuous +----------+--------+--------+--------+-----------------------+--------+ CCA Distal45      7               smooth and heterogenous         +----------+--------+--------+--------+-----------------------+--------+ ICA Prox  39      8               smooth and heterogenous         +----------+--------+--------+--------+-----------------------+--------+ ICA Distal49      12  tortuous +----------+--------+--------+--------+-----------------------+--------+ ECA       73      6                                               +----------+--------+--------+--------+-----------------------+--------+ +----------+--------+-------+--------+-------------------+           PSV cm/sEDV cmsDescribeArm Pressure (mmHG) +----------+--------+-------+--------+-------------------+ Subclavian115                                        +----------+--------+-------+--------+-------------------+ +---------+--------+--+--------+-+---------+ VertebralPSV cm/s48EDV cm/s7Antegrade +---------+--------+--+--------+-+---------+  Left Carotid Findings: +----------+--------+--------+--------+-----------------------+--------+           PSV cm/sEDV cm/sStenosisPlaque Description     Comments +----------+--------+--------+--------+-----------------------+--------+ CCA Prox  92      14              smooth and heterogenous         +----------+--------+--------+--------+-----------------------+--------+ CCA Distal47      7               smooth and heterogenous         +----------+--------+--------+--------+-----------------------+--------+ ICA Prox  37      8                smooth and heterogenous         +----------+--------+--------+--------+-----------------------+--------+ ICA Distal49      8                                      tortuous +----------+--------+--------+--------+-----------------------+--------+ ECA       53      6                                               +----------+--------+--------+--------+-----------------------+--------+ +----------+--------+--------+--------+-------------------+           PSV cm/sEDV cm/sDescribeArm Pressure (mmHG) +----------+--------+--------+--------+-------------------+ [id number]                                         +----------+--------+--------+--------+-------------------+ +---------+--------+--+--------+-+---------+ VertebralPSV cm/s44EDV cm/s8Antegrade +---------+--------+--+--------+-+---------+   Summary: Right Carotid: Velocities in the right ICA are consistent with a 1-39% stenosis. Left Carotid: Velocities in the left ICA are consistent with a 1-39% stenosis. Vertebrals: Bilateral vertebral arteries demonstrate antegrade flow. *See table(s) above for measurements and observations.  Electronically signed by Antony Contras MD on 02/22/2021 at 8:22:23 AM.    Final     Labs:  CBC: Recent Labs    02/19/21 0144 02/20/21 0509 02/21/21 0502 02/22/21 0517  WBC 10.9* 7.6 8.0 9.1  HGB 10.8* 10.4* 10.9* 11.5*  HCT 33.3* 31.8* 33.7* 35.1*  PLT 548* 581* 579* 627*    COAGS: No results for input(s): INR, APTT in the last 8760 hours.  BMP: Recent Labs    02/19/21 0144 02/20/21 0509 02/21/21 0502 02/22/21 0517  NA 142 143 143 142  K 3.3* 3.6 3.1* 3.2*  CL 114* 116* 117* 113*  CO2 17* 17* 16* 19*  GLUCOSE 128* 106* 135* 139*  BUN 20 16 12 10   CALCIUM  9.3 9.1 9.1 9.1  CREATININE 1.32* 1.09* 0.91 1.03*  GFRNONAA 45* 56* >60 60*    LIVER FUNCTION TESTS: Recent Labs    02/19/21 0144 02/20/21 0509 02/21/21 0502 02/22/21 0517  BILITOT 0.6 0.5 0.6 0.6   AST 78* 73* 57* 45*  ALT 68* 72* 67* 63*  ALKPHOS 75 74 78 82  PROT 7.4 7.0 7.2 7.5  ALBUMIN 2.8* 2.7* 2.9* 3.1*    Assessment and Plan: Ventral hernia with SBO POD#9 S/P ventral hernia repair with small bowel resection, LOA 10/31 Dr. Kieth Brightly  : s/p ant perit drain placement 11/5- cx with e coli; MRI brain 11/12 with acute high rt parietal lobe infarct; afebrile; WBC nl; hgb 11.5, creat 1.03, drain OP minimal over last few days per nursing; will plan drain injection on 11/17 to exclude fistula- d/w pt/family  Electronically Signed: D. Rowe Robert, PA-C 02/22/2021, 3:09 PM   I spent a total of 15 Minutes at the the patient's bedside AND on the patient's hospital floor or unit, greater than 50% of which was counseling/coordinating care for anterior abdominal abscess drain    Patient ID: Laurie [name], female   DOB: Oct 08, 1955, 65 y.o.   MRN: [id number]

## 2021-02-22 NOTE — Progress Notes (Signed)
Subjective: She complains of mild dizziness by which she means sense of disequilibrium.  This occurred in the setting of her first time getting up for the day.  Exam: Vitals:   02/22/21 0312 02/22/21 0820  BP: (!) 166/80   Pulse: 62   Resp: 18   Temp: 98 F (36.7 C)   SpO2: 99% 94%   Gen: In bed, NAD Resp: non-labored breathing, no acute distress Abd: soft, nt  Neuro: MS: Awake, alert, interactive and appropriate CN: Visual fields full, EOMI without nystagmus Motor: 5/5 throughout Sensory: Intact light touch Cerebellar: Intact finger-nose-finger and heel-knee-shin  Pertinent Labs: Cr 1.03 K 3.2 LDL 97  Impression: 65 year old female with MRI performed for hospital related delirium which discovered to incidental embolic appearing infarcts.  I do think that cardioembolic disease needs to be considered and therefore I have recommended TEE and prolonged outpatient cardiac monitoring.  With no neurological findings on exam, can continue to monitor for improvement in her mild disequilibrium.  Recommendations: 1) continue ASA 81 mg daily 2) awaiting TEE and cardiac monitoring 3) continue Crestor 10 mg daily 4) neurology will follow after above imaging  Ritta Slot, MD [facility] [phone]  If 7pm- 7am, please page neurology on call as listed in AMION.

## 2021-02-22 NOTE — Progress Notes (Signed)
Occupational Therapy Treatment Patient Details Name: Laurie [name] MRN: [id number] DOB: April 14, 1955 Today's Date: 02/22/2021   History of present illness Pa Laurie [name] is a 65 y.o. female who presented to the ED 02/06/21 with abdominal pain, CT revealed an incarcerated ventral hernia. Pt procedures: 02/06/21 small bowell resection, hernia repair and lysis of adhesions, 11/4 wound vac placement, 11/5 drain placement for anterior peirtoneal abcess. On 11/14 Neuroimaging for encephalopathy showed R PCA subacute stroke with petechial hemorrhage and acute punctate cortical ischemic strokes in both hemispheres. PMH significant for  T2DM, HTN, HLD, anxiety/depression, GERD, obesity and multiple abdominopelvic surgeries.   OT comments  Patient needed increased encouragement to participate in time out of bed and functional activity challenges today. Patient was able to don slipper socks with set up at bed level. Patient's discharge plan remains appropriate at this time. OT will continue to follow acutely.     Recommendations for follow up therapy are one component of a multi-disciplinary discharge planning process, led by the attending physician.  Recommendations may be updated based on patient status, additional functional criteria and insurance authorization.    Follow Up Recommendations  Home health OT    Assistance Recommended at Discharge Frequent or constant Supervision/Assistance  Equipment Recommendations  Other (comment)    Recommendations for Other Services      Precautions / Restrictions Precautions Precautions: Fall Precaution Comments: abdomen, wound vac and JP drain on R Restrictions Weight Bearing Restrictions: No       Mobility Bed Mobility Overal bed mobility: Needs Assistance     Sidelying to sit: Min guard;HOB elevated            Transfers Overall transfer level: Needs assistance Equipment used: Rolling walker (2 wheels) Transfers: Sit to/from  Stand Sit to Stand: Min guard                 Balance Overall balance assessment: Needs assistance Sitting-balance support: Feet supported Sitting balance-Leahy Scale: Good     Standing balance support: During functional activity;Bilateral upper extremity supported;Reliant on assistive device for balance Standing balance-Leahy Scale: Fair                             ADL either performed or assessed with clinical judgement   ADL Overall ADL's : Needs assistance/impaired                     Lower Body Dressing: Bed level;Set up Lower Body Dressing Details (indicate cue type and reason): patient was able to don slippers at bed level with set up             Functional mobility during ADLs: Min guard;Rolling walker (2 wheels) General ADL Comments: patient participated in functional mobility in hallway with min guard and line management. patient reported feeling dizziness at end of session. patient transfered to recliner chair with chari alarm on with nursing in room assessing blood sugar.    Extremity/Trunk Assessment              Vision Patient Visual Report: No change from baseline     Perception     Praxis      Cognition Arousal/Alertness: Awake/alert Behavior During Therapy: WFL for tasks assessed/performed Overall Cognitive Status: Within Functional Limits for tasks assessed  Exercises     Shoulder Instructions       General Comments      Pertinent Vitals/ Pain       Pain Assessment: Faces Faces Pain Scale: Hurts a little bit Pain Location: abdomen Pain Descriptors / Indicators: Discomfort Pain Intervention(s): Limited activity within patient's tolerance;Monitored during session;Repositioned  Home Living                                          Prior Functioning/Environment              Frequency  Min 2X/week        Progress Toward  Goals  OT Goals(current goals can now be found in the care plan section)  Progress towards OT goals: Progressing toward goals  ADL Goals Pt Will Perform Lower Body Dressing: bed level;with set-up  Plan Discharge plan remains appropriate    Co-evaluation                 AM-PAC OT "6 Clicks" Daily Activity     Outcome Measure   Help from another person eating meals?: None Help from another person taking care of personal grooming?: A Little Help from another person toileting, which includes using toliet, bedpan, or urinal?: A Little Help from another person bathing (including washing, rinsing, drying)?: A Lot Help from another person to put on and taking off regular upper body clothing?: A Little Help from another person to put on and taking off regular lower body clothing?: A Little 6 Click Score: 18    End of Session Equipment Utilized During Treatment: Rolling walker (2 wheels)  OT Visit Diagnosis: Unsteadiness on feet (R26.81);Other abnormalities of gait and mobility (R26.89)   Activity Tolerance Patient tolerated treatment well   Patient Left in chair;with call bell/phone within reach;with chair alarm set;with nursing/sitter in room   Nurse Communication Mobility status        Time: 1610-9604 OT Time Calculation (min): 38 min  Charges: OT General Charges $OT Visit: 1 Visit OT Treatments $Therapeutic Activity: 38-52 mins  Sharyn Blitz OTR/L, MS Acute Rehabilitation Department Office# [phone] Pager# [phone]   Chalmers Guest Cyncere Ruhe 02/22/2021, 11:55 AM

## 2021-02-22 NOTE — Progress Notes (Signed)
Patient ID: Laurie [name], female   DOB: [birth year], 65 y.o.   MRN: [id number] [facility] Surgery Progress Note  16 Days Post-Op  Subjective: CC-  Patient reports some abdominal pain today. Mild nausea, no emesis. Still not eating much. Asking for cereal. Drank Ensure x1 yesterday. States that she had a BM yesterday but this is not recorded, last recorded BM was 2 days ago. WBC 9.1, afebrile  Objective: Vital signs in last 24 hours: Temp:  [97.9 F (36.6 C)-98.3 F (36.8 C)] 98 F (36.7 C) (11/16 0312) Pulse Rate:  [62-74] 62 (11/16 0312) Resp:  [18-20] 18 (11/16 0312) BP: (152-166)/(71-80) 166/80 (11/16 0312) SpO2:  [94 %-100 %] 94 % (11/16 0820) Last BM Date: 02/21/21  Intake/Output from previous day: 11/15 0701 - 11/16 0700 In: 150 [IV Piggyback:150] Out: 1360 [Urine:1360] Intake/Output this shift: No intake/output data recorded.  PE: Gen:  Alert, NAD, pleasant Pulm: Rate and effort normal Abd: vac to midline with good seal. Thin yellow drainage in cannister. Abd soft, nondistended, nontender. Drain with no fluid in bulb Ext: MAE's Psych: Alert and oriented to self, states year is 2020, states that she is at [facility]  Lab Results:  Recent Labs    02/21/21 0502 02/22/21 0517  WBC 8.0 9.1  HGB 10.9* 11.5*  HCT 33.7* 35.1*  PLT 579* 627*   BMET Recent Labs    02/21/21 0502 02/22/21 0517  NA 143 142  K 3.1* 3.2*  CL 117* 113*  CO2 16* 19*  GLUCOSE 135* 139*  BUN 12 10  CREATININE 0.91 1.03*  CALCIUM 9.1 9.1   PT/INR No results for input(s): LABPROT, INR in the last 72 hours. CMP     Component Value Date/Time   NA 142 02/22/2021 0517   K 3.2 (L) 02/22/2021 0517   CL 113 (H) 02/22/2021 0517   CO2 19 (L) 02/22/2021 0517   GLUCOSE 139 (H) 02/22/2021 0517   BUN 10 02/22/2021 0517   CREATININE 1.03 (H) 02/22/2021 0517   CALCIUM 9.1 02/22/2021 0517   PROT 7.5 02/22/2021 0517   ALBUMIN 3.1 (L) 02/22/2021 0517   AST 45 (H) 02/22/2021 0517    ALT 63 (H) 02/22/2021 0517   ALKPHOS 82 02/22/2021 0517   BILITOT 0.6 02/22/2021 0517   GFRNONAA 60 (L) 02/22/2021 0517   GFRAA 33 (L) 07/01/2015 0352   Lipase     Component Value Date/Time   LIPASE 34 02/06/2021 1315       Studies/Results: MR ANGIO HEAD WO CONTRAST  Result Date: 02/20/2021 CLINICAL DATA:  Stroke follow-up EXAM: MRA HEAD WITHOUT CONTRAST TECHNIQUE: Angiographic images of the Circle of Willis were acquired using MRA technique without intravenous contrast. COMPARISON:  Two days ago FINDINGS: Anterior circulation: Luminal irregularity of the carotid siphons with luminal narrowing at the anterior genu on the right that is moderate and likely accentuated by flow artifact and mild motion. Mild atheromatous irregularity of MCA branches. Negative for aneurysm or beading. Posterior circulation: Left dominant. No branch occlusion, beading, or aneurysm. IMPRESSION: 1. No emergent finding. 2. Irregularity of the carotid siphons attributed to atherosclerosis with up to moderate narrowing on the right. Electronically Signed   By: Tiburcio Pea M.D.   On: 02/20/2021 11:46   VAS US CAROTID  Result Date: 02/22/2021 Carotid Arterial Duplex Study Patient Name:  Laurie [name]  Date of Exam:   02/21/2021 Medical Rec #: [id number]            Accession #:  [id number] Date of Birth: 1956/03/10            Patient Gender: F Patient Age:   44 years Exam Location:  [facility] Procedure:      VAS US CAROTID Referring Phys: MCNEILL KIRKPATRICK --------------------------------------------------------------------------------  Indications:       CVA. Risk Factors:      Hypertension. Limitations        Today's exam was limited due to the patient's respiratory                    variation and patient positioning. Comparison Study:  No prior studies. Performing Technologist: Chanda Busing RVT  Examination Guidelines: A complete evaluation includes B-mode imaging, spectral Doppler, color  Doppler, and power Doppler as needed of all accessible portions of each vessel. Bilateral testing is considered an integral part of a complete examination. Limited examinations for reoccurring indications may be performed as noted.  Right Carotid Findings: +----------+--------+--------+--------+-----------------------+--------+           PSV cm/sEDV cm/sStenosisPlaque Description     Comments +----------+--------+--------+--------+-----------------------+--------+ CCA Prox  88      9               smooth and heterogenoustortuous +----------+--------+--------+--------+-----------------------+--------+ CCA Distal45      7               smooth and heterogenous         +----------+--------+--------+--------+-----------------------+--------+ ICA Prox  39      8               smooth and heterogenous         +----------+--------+--------+--------+-----------------------+--------+ ICA Distal49      12                                     tortuous +----------+--------+--------+--------+-----------------------+--------+ ECA       73      6                                               +----------+--------+--------+--------+-----------------------+--------+ +----------+--------+-------+--------+-------------------+           PSV cm/sEDV cmsDescribeArm Pressure (mmHG) +----------+--------+-------+--------+-------------------+ Subclavian115                                        +----------+--------+-------+--------+-------------------+ +---------+--------+--+--------+-+---------+ VertebralPSV cm/s48EDV cm/s7Antegrade +---------+--------+--+--------+-+---------+  Left Carotid Findings: +----------+--------+--------+--------+-----------------------+--------+           PSV cm/sEDV cm/sStenosisPlaque Description     Comments +----------+--------+--------+--------+-----------------------+--------+ CCA Prox  92      14              smooth and heterogenous          +----------+--------+--------+--------+-----------------------+--------+ CCA Distal47      7               smooth and heterogenous         +----------+--------+--------+--------+-----------------------+--------+ ICA Prox  37      8               smooth and heterogenous         +----------+--------+--------+--------+-----------------------+--------+ ICA Distal49      8  tortuous +----------+--------+--------+--------+-----------------------+--------+ ECA       53      6                                               +----------+--------+--------+--------+-----------------------+--------+ +----------+--------+--------+--------+-------------------+           PSV cm/sEDV cm/sDescribeArm Pressure (mmHG) +----------+--------+--------+--------+-------------------+ [id number]                                         +----------+--------+--------+--------+-------------------+ +---------+--------+--+--------+-+---------+ VertebralPSV cm/s44EDV cm/s8Antegrade +---------+--------+--+--------+-+---------+   Summary: Right Carotid: Velocities in the right ICA are consistent with a 1-39% stenosis. Left Carotid: Velocities in the left ICA are consistent with a 1-39% stenosis. Vertebrals: Bilateral vertebral arteries demonstrate antegrade flow. *See table(s) above for measurements and observations.  Electronically signed by Delia Heady MD on 02/22/2021 at 8:22:23 AM.    Final     Anti-infectives: Anti-infectives (From admission, onward)    Start     Dose/Rate Route Frequency Ordered Stop   02/12/21 1200  piperacillin-tazobactam (ZOSYN) IVPB 3.375 g        3.375 g 12.5 mL/hr over 240 Minutes Intravenous Every 8 hours 02/12/21 0950     02/06/21 2003  ceFAZolin (ANCEF) 2-4 GM/100ML-% IVPB       Note to Pharmacy: Ponciano Ort   : cabinet override      02/06/21 2003 02/06/21 2046   02/06/21 1945  ceFAZolin (ANCEF) IVPB 2g/100 mL premix         2 g 200 mL/hr over 30 Minutes Intravenous On call to O.R. 02/06/21 1914 02/06/21 2028        Assessment/Plan Ventral hernia with SBO POD#16 S/P ventral hernia repair with small bowel resection, LOA 10/31 Dr. Sheliah Hatch - s/p IR drain 11/5, culture with somewhat resistant E coli but sensitive to zosyn - continue  - CT 11/10 showed resolution of drained fluid collection with drain in place and a few dots of residual air, no new concerning findings. No definite enterocutaneous fistula but is high risk. Keep drain in place, will need follow up in IR with drain injection prior to considering removal. 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

## 2021-02-23 ENCOUNTER — Inpatient Hospital Stay (HOSPITAL_COMMUNITY): Payer: Medicare PPO

## 2021-02-23 HISTORY — PX: IR SINUS/FIST TUBE CHK-NON GI: IMG673

## 2021-02-23 LAB — CBC WITH DIFFERENTIAL/PLATELET
Abs Immature Granulocytes: 0.09 10*3/uL — ABNORMAL HIGH (ref 0.00–0.07)
Basophils Absolute: 0.1 10*3/uL (ref 0.0–0.1)
Basophils Relative: 1 %
Eosinophils Absolute: 0.2 10*3/uL (ref 0.0–0.5)
Eosinophils Relative: 2 %
HCT: 36.6 % (ref 36.0–46.0)
Hemoglobin: 12 g/dL (ref 12.0–15.0)
Immature Granulocytes: 1 %
Lymphocytes Relative: 18 %
Lymphs Abs: 1.6 10*3/uL (ref 0.7–4.0)
MCH: 32.3 pg (ref 26.0–34.0)
MCHC: 32.8 g/dL (ref 30.0–36.0)
MCV: 98.7 fL (ref 80.0–100.0)
Monocytes Absolute: 1.2 10*3/uL — ABNORMAL HIGH (ref 0.1–1.0)
Monocytes Relative: 13 %
Neutro Abs: 6.2 10*3/uL (ref 1.7–7.7)
Neutrophils Relative %: 65 %
Platelets: 551 10*3/uL — ABNORMAL HIGH (ref 150–400)
RBC: 3.71 MIL/uL — ABNORMAL LOW (ref 3.87–5.11)
RDW: 15.3 % (ref 11.5–15.5)
WBC: 9.4 10*3/uL (ref 4.0–10.5)
nRBC: 0 % (ref 0.0–0.2)

## 2021-02-23 LAB — COMPREHENSIVE METABOLIC PANEL
ALT: 56 U/L — ABNORMAL HIGH (ref 0–44)
AST: 37 U/L (ref 15–41)
Albumin: 3 g/dL — ABNORMAL LOW (ref 3.5–5.0)
Alkaline Phosphatase: 76 U/L (ref 38–126)
Anion gap: 10 (ref 5–15)
BUN: 12 mg/dL (ref 8–23)
CO2: 15 mmol/L — ABNORMAL LOW (ref 22–32)
Calcium: 9.2 mg/dL (ref 8.9–10.3)
Chloride: 115 mmol/L — ABNORMAL HIGH (ref 98–111)
Creatinine, Ser: 1.11 mg/dL — ABNORMAL HIGH (ref 0.44–1.00)
GFR, Estimated: 55 mL/min — ABNORMAL LOW (ref >60)
Glucose, Bld: 133 mg/dL — ABNORMAL HIGH (ref 70–99)
Potassium: 3.6 mmol/L (ref 3.5–5.1)
Sodium: 140 mmol/L (ref 135–145)
Total Bilirubin: 0.4 mg/dL (ref 0.3–1.2)
Total Protein: 7.2 g/dL (ref 6.5–8.1)

## 2021-02-23 LAB — CULTURE, BLOOD (ROUTINE X 2)
Culture: NO GROWTH
Culture: NO GROWTH
Special Requests: ADEQUATE

## 2021-02-23 LAB — GLUCOSE, CAPILLARY
Glucose-Capillary: 148 mg/dL — ABNORMAL HIGH (ref 70–99)
Glucose-Capillary: 212 mg/dL — ABNORMAL HIGH (ref 70–99)
Glucose-Capillary: 130 mg/dL — ABNORMAL HIGH (ref 70–99)
Glucose-Capillary: 185 mg/dL — ABNORMAL HIGH (ref 70–99)

## 2021-02-23 IMAGING — XA IR FISTULA/SINUS TRACT
2 series · 8 of 8 positions shown · non-contrast
Comparison: [DATE], [DATE], [DATE]

CLINICAL DATA: 65-year-old female with history of recent abdominal
wall hernia repair complicated postoperatively by anterior abdominal
wall fluid collection status post percutaneous drain placement on
[DATE]. There has been trace output from the drain over the past
several days and recent repeat CT demonstrates no significant
residual fluid collection.

EXAM:
SINUS TRACT INJECTION/FISTULOGRAM
TECHNIQUE: The patient was positioned supine on the fluoroscopy table.

[Series 1: ir sinus/fist tube chk-non gi · 3 of 3 slices shown (1 of 2)]
[im 1/3]
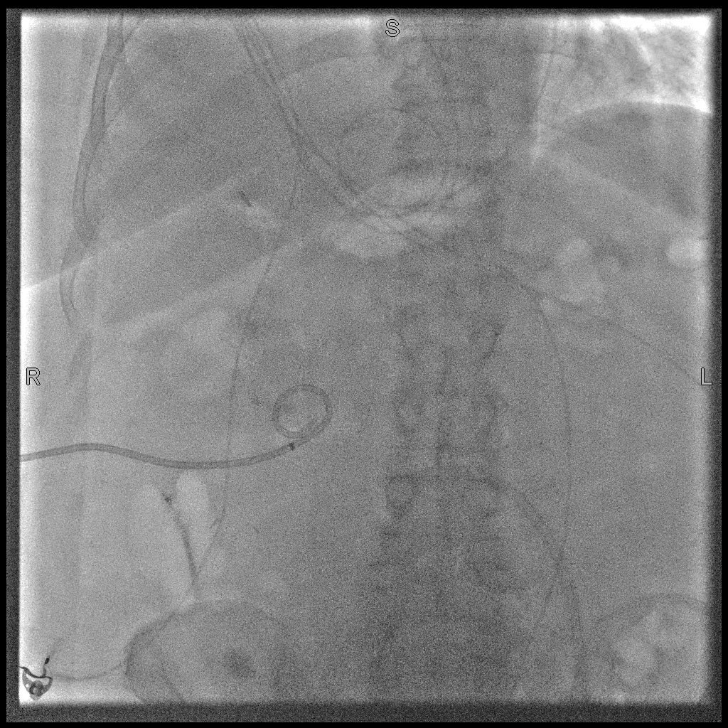
[im 2/3]
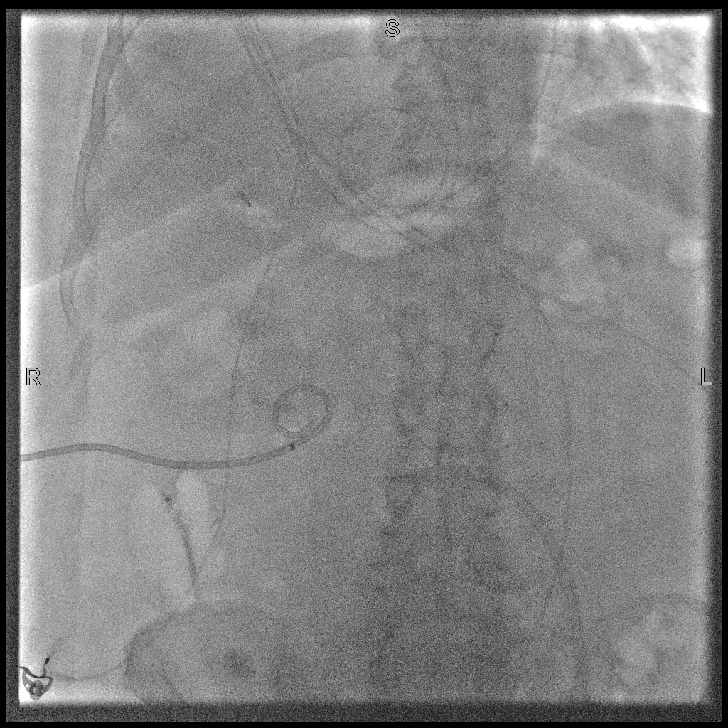
[im 3/3]
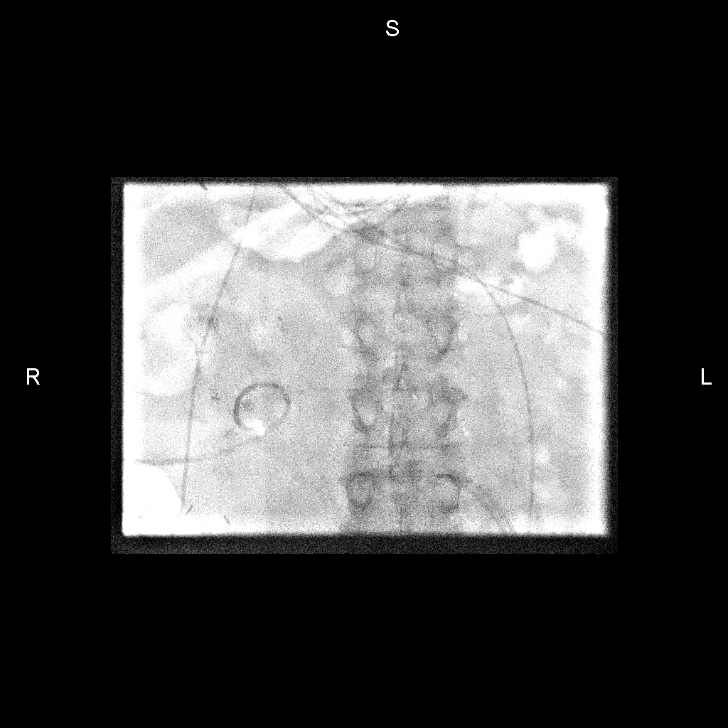

[Series 1: ir sinus/fist tube chk-non gi · 2 acquisitions, 5 frames shown (2 of 2)]
[im 1/2]
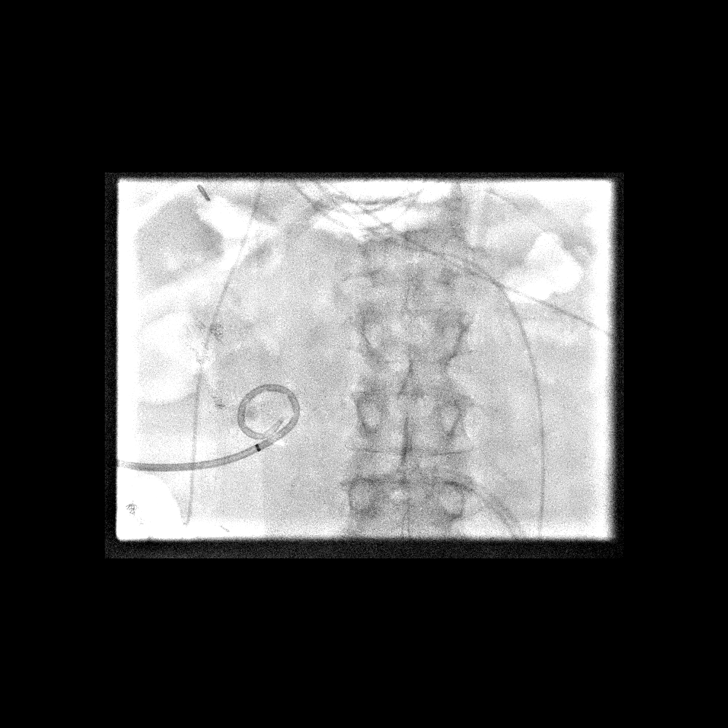
[im 1/2]
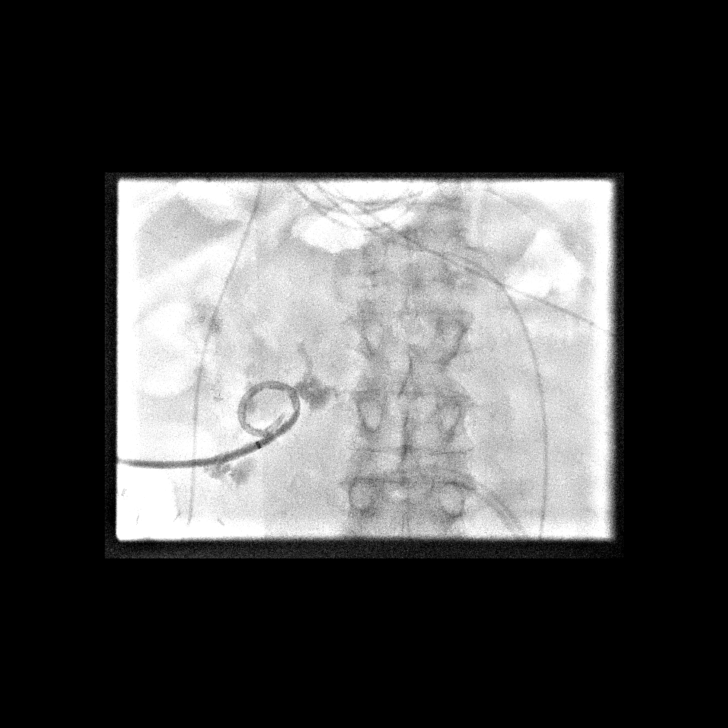
[im 1/2]
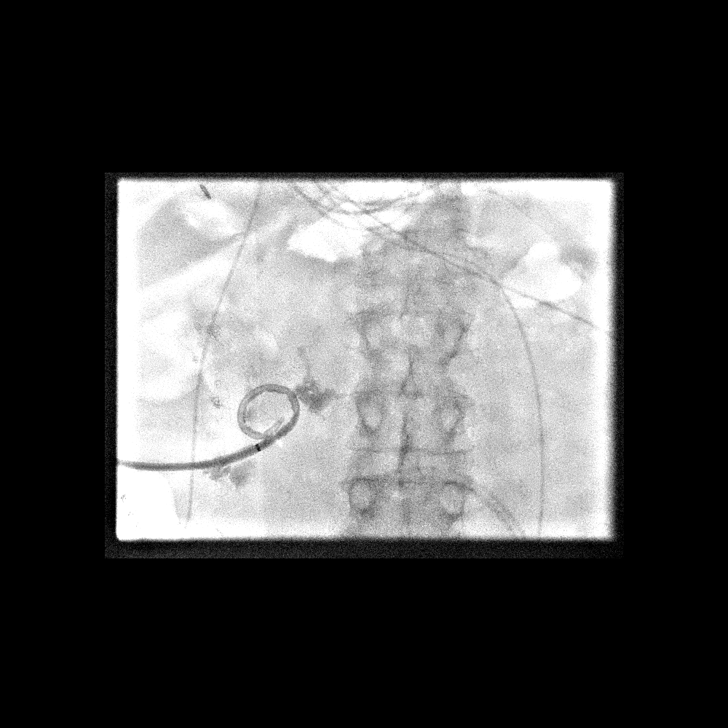
[im 1/2]
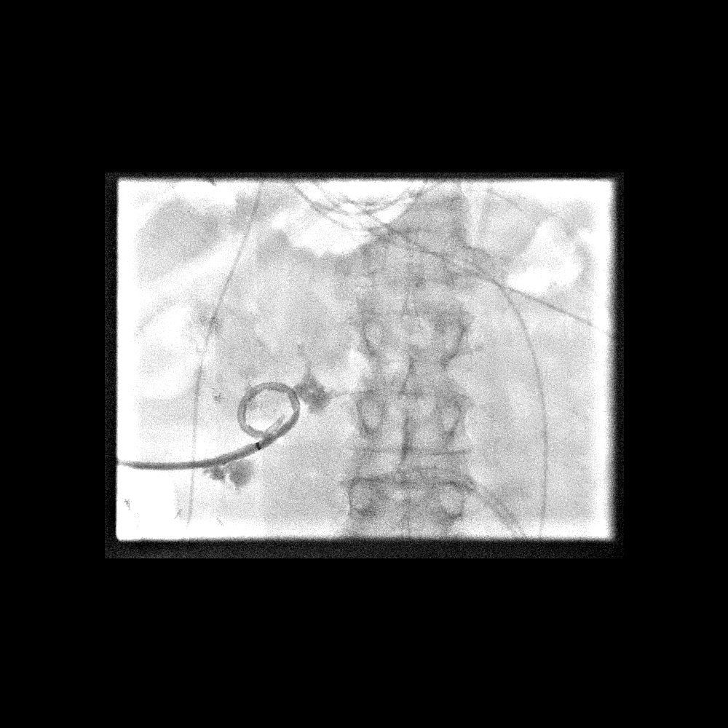
[im 2/2]
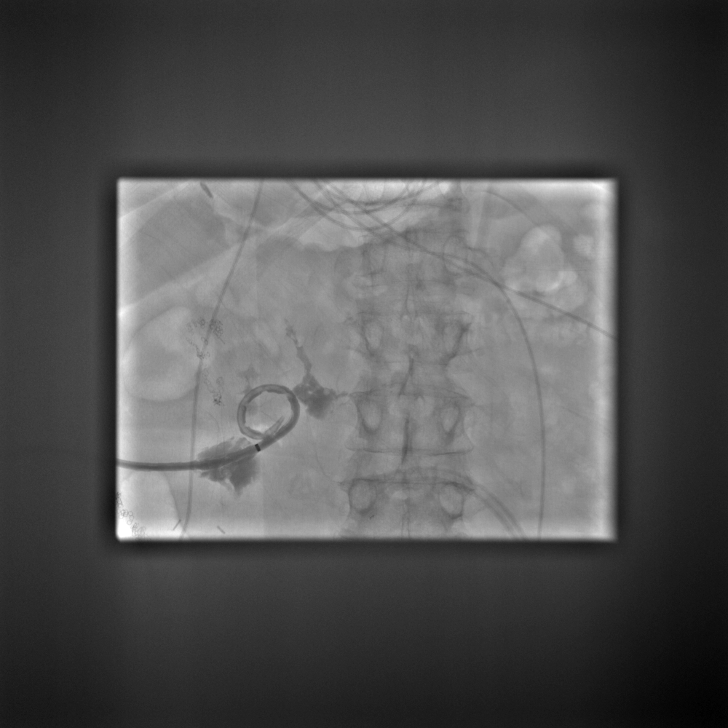

[8 of 8 positions shown; findings below may reference images not displayed]

CONTRAST:  5 mL-administered via the existing percutaneous drain.

FLUOROSCOPY TIME:  14 seconds, 4 mGy
A preprocedural spot fluoroscopic image was obtained of the right
mid abdomen and the existing percutaneous drainage catheter.

Multiple spot fluoroscopic and radiographic images were obtained
following the injection of a small amount of contrast via the
existing percutaneous drainage catheter.

The external portion of the drain was cut to release the pigtail and
the drain was removed without complication. Sterile bandage was
applied.
FINDINGS: Trace residual fluid collection without evidence of enteric fistula.
IMPRESSION: Trace residual fluid collection without evidence of enteric fistula.
The drain was removed successfully.

## 2021-02-23 MED ORDER — IOHEXOL 300 MG/ML  SOLN
10.0000 mL | Freq: Once | INTRAMUSCULAR | Status: AC | PRN
  Administered 2021-02-23: 11:00:00 5 mL

## 2021-02-23 MED ORDER — POTASSIUM CHLORIDE CRYS ER 20 MEQ PO TBCR
40.0000 meq | EXTENDED_RELEASE_TABLET | Freq: Once | ORAL | Status: AC
  Administered 2021-02-23: 14:00:00 40 meq via ORAL
  Filled 2021-02-23: qty 2

## 2021-02-23 NOTE — Progress Notes (Signed)
    [facility] has been requested to perform a transesophageal echocardiogram on 11/18 for bacteremia.  After careful review of history and examination, the risks and benefits of transesophageal echocardiogram have been explained including risks of esophageal damage, perforation (1:10,000 risk), bleeding, pharyngeal hematoma as well as other potential complications associated with conscious sedation including aspiration, arrhythmia, respiratory failure and death. Alternatives to treatment were discussed, questions were answered. Patient is willing to proceed.   Theodore Demark, PA-C 02/23/2021 4:16 PM

## 2021-02-23 NOTE — Progress Notes (Signed)
PROGRESS NOTE    Laurie [name]  [id number] DOB: Jun 23, 1955 DOA: 02/06/2021 PCP: Cindra Eves, MD    Brief NarrativePH:[id number] w/ a hx of DM2, HTN, HLD, anxiety/depression, GERD, obesity, and multiple abdomino-pelvic surgeries who presented to the ED with 5 days of worsening lower abdominal pain associated with loose stools and vomiting. In the ED CT revealed an incarcerated ventral hernia. EDP attempted reduction though symptoms did not improve. Labs notable for acute renal failure in the setting of dehydration. She was transferred to [facility] ED for surgical consult, and repeat CT abdomen showed persistent hernia with bowel obstruction for which she was taken to the OR.  Assessment & Plan:   Active Problems:   CAD (coronary artery disease)   Essential hypertension   Acute renal failure (ARF) (HCC)   Diabetes mellitus without complication (HCC)   Incarcerated ventral hernia   SBO (small bowel obstruction) (HCC)   Obesity   Malnutrition of moderate degree  Incarcerated ventral hernia with SBO Anterior peirtoneal abscess Currently afebrile with resolved leukocytosis Status post repair with small bowel resection and LOA 10/31 Persisting abdominal discomfort and climbing WBC raised concern leading to CT abdomen pelvis 11/4 which noted questionable fluid collection within the abdomen which was drained by IR on 11/5 Continue IV Zosyn as per gen surgery Given minimal output, IR plan for drain injection study -Cont wound vac MWF per General Surgery   Acute renal failure- improved Metabolic acidosis Acute urinary retention- resolved (pt self cath) Cr peaked to 1.34, showing improvement In and out prn (pt reports self cath about 3 times a day due to urinary diversion)   E.coli UTI Urine culture >100,000 E.coli, 40,000 klebsiella pneumoniae  Continued on IV zosyn Afebrile   Hypokalemia Will replace Daily CMP   Acute CVA MRI showed acute ischemic nonhemorrhagic cortical  infarct cortical infarct, evolving late subacute right PCA territory infarct involving the right occipital lobe, associated petechial blood products without frank intraparenchymal hematoma or significant mass-effect MRA head and neck no emergent findings Echo with EF 65 to [id number], grade 1 diastolic dysfunction, bubble study negative with no evidence of inter-atrial shunt LDL, 97, A1c--> 6.2 Start ASA, continue home crestor PT/OT Telemetry Neurology consulted- suspicious of embolic in nature, rec further work up Pending TEE, outpt cardiac monitoring, on chart review, TEE  seems to be scheduled for [id number]   Acute metabolic encephalopathy Improving Likely due to UTI vs CVA as above Afebrile, with resolved leukocytosis BC X 2 NGTD Abd xray with distended urinary conduit Recent CXR unremarkable Answering questions appropriately   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 Terance Hart at [facility] - history of cystectomy and right colon pouch for interstitial cystitis 1990s - pouch stone is found to be nonobstructing   DM2 Continue with SSI, accuchecks   HTN BP stable   COPD Stable Continue usual Dulera and as needed albuterol   GERD PPI   Obesity - Body mass index is 34.33 kg/m. Recommend continued diet and lifestyle modification   DVT prophylaxis: Lovenox subq Code Status: Full Family Communication: Pt in room, family not at bedside  Status is: Inpatient  Remains inpatient appropriate because: Severity of illness   Consultants:  General Surgery  IR  Procedures:  10/31 ventral hernia repair with small bowel resection and LOA 11/5 CT-guided drainage of anterior peritoneal abscess in IR -10 French drain left in place none  Antimicrobials: Anti-infectives (From admission, onward)    Start     Dose/Rate  Route Frequency Ordered Stop   02/12/21 1200  piperacillin-tazobactam (ZOSYN) IVPB 3.375 g        3.375 g 12.5 mL/hr over 240 Minutes  Intravenous Every 8 hours 02/12/21 0950     02/06/21 2003  ceFAZolin (ANCEF) 2-4 GM/100ML-% IVPB       Note to Pharmacy: Cleda Daub   : cabinet override      02/06/21 2003 02/06/21 2046   02/06/21 1945  ceFAZolin (ANCEF) IVPB 2g/100 mL premix        2 g 200 mL/hr over 30 Minutes Intravenous On call to O.R. 02/06/21 1914 02/06/21 2028       Subjective: States having mild abd discomfort  Objective: Vitals:   02/22/21 1500 02/22/21 2031 02/23/21 0350 02/23/21 0903  BP: (!) 154/68 (!) 155/67 (!) 165/75   Pulse: 67 65 68   Resp: 18 20 20    Temp: 98.3 F (36.8 C) 97.9 F (36.6 C) 97.9 F (36.6 C)   TempSrc: Oral Oral Oral   SpO2: 96% 99% 99% 98%  Weight:      Height:        Intake/Output Summary (Last 24 hours) at 02/23/2021 1504 Last data filed at 02/23/2021 0600 Gross per 24 hour  Intake 107.93 ml  Output 257 ml  Net -149.07 ml    Filed Weights   02/06/21 1053  Weight: 90.7 kg    Examination: General exam: Conversant, in no acute distress Respiratory system: normal chest rise, clear, no audible wheezing Cardiovascular system: regular rhythm, s1-s2 Gastrointestinal system: Nondistended, nontender, pos BS Central nervous system: No seizures, no tremors Extremities: No cyanosis, no joint deformities Skin: No rashes, no pallor Psychiatry: Affect normal // no auditory hallucinations    Data Reviewed: I have personally reviewed following labs and imaging studies  CBC: Recent Labs  Lab 02/19/21 0144 02/20/21 0509 02/21/21 0502 02/22/21 0517 02/23/21 0446  WBC 10.9* 7.6 8.0 9.1 9.4  NEUTROABS 7.3 4.9 5.6 6.2 6.2  HGB 10.8* 10.4* 10.9* 11.5* 12.0  HCT 33.3* 31.8* 33.7* 35.1* 36.6  MCV 99.4 98.5 99.7 98.0 98.7  PLT 548* 581* 579* 627* 551*    Basic Metabolic Panel: Recent Labs  Lab 02/18/21 1136 02/19/21 0144 02/20/21 0509 02/21/21 0502 02/22/21 0517 02/23/21 0446  NA 138 142 143 143 142 140  K 3.5 3.3* 3.6 3.1* 3.2* 3.6  CL 112* 114* 116* 117*  113* 115*  CO2 15* 17* 17* 16* 19* 15*  GLUCOSE 157* 128* 106* 135* 139* 133*  BUN 19 20 16 12 10 12   CREATININE 1.34* 1.32* 1.09* 0.91 1.03* 1.11*  CALCIUM 9.4 9.3 9.1 9.1 9.1 9.2  MG 1.9  --   --  1.8  --   --     GFR: Estimated Creatinine Clearance: 54.4 mL/min (A) (by C-G formula based on SCr of 1.11 mg/dL (H)). Liver Function Tests: Recent Labs  Lab 02/19/21 0144 02/20/21 0509 02/21/21 0502 02/22/21 0517 02/23/21 0446  AST 78* 73* 57* 45* 37  ALT 68* 72* 67* 63* 56*  ALKPHOS 75 74 78 82 76  BILITOT 0.6 0.5 0.6 0.6 0.4  PROT 7.4 7.0 7.2 7.5 7.2  ALBUMIN 2.8* 2.7* 2.9* 3.1* 3.0*    No results for input(s): LIPASE, AMYLASE in the last 168 hours. No results for input(s): AMMONIA in the last 168 hours. Coagulation Profile: No results for input(s): INR, PROTIME in the last 168 hours. Cardiac Enzymes: No results for input(s): CKTOTAL, CKMB, CKMBINDEX, TROPONINI in the last 168 hours. BNP (  last 3 results) No results for input(s): PROBNP in the last 8760 hours. HbA1C: No results for input(s): HGBA1C in the last 72 hours. CBG: Recent Labs  Lab 02/22/21 1142 02/22/21 1652 02/22/21 2033 02/23/21 0825 02/23/21 1326  GLUCAP 183* 202* 151* 148* 212*    Lipid Profile: No results for input(s): CHOL, HDL, LDLCALC, TRIG, CHOLHDL, LDLDIRECT in the last 72 hours. Thyroid Function Tests: No results for input(s): TSH, T4TOTAL, FREET4, T3FREE, THYROIDAB in the last 72 hours. Anemia Panel: No results for input(s): VITAMINB12, FOLATE, FERRITIN, TIBC, IRON, RETICCTPCT in the last 72 hours. Sepsis Labs: No results for input(s): PROCALCITON, LATICACIDVEN in the last 168 hours.  Recent Results (from the past 240 hour(s))  Culture, blood (routine x 2)     Status: None (Preliminary result)   Collection Time: 02/18/21 12:04 PM   Specimen: BLOOD  Result Value Ref Range Status   Specimen Description   Final    BLOOD RIGHT ANTECUBITAL Performed at [facility], [address], [place], [place] [address]    Special Requests   Final    BOTTLES DRAWN AEROBIC ONLY Blood Culture adequate volume Performed at [facility], [address] [place], [place] [address]    Culture   Final    NO GROWTH 4 DAYS Performed at [facility], [address] [place], [place] [address]    Report Status PENDING  Incomplete  Culture, blood (routine x 2)     Status: None (Preliminary result)   Collection Time: 02/18/21 12:04 PM   Specimen: BLOOD LEFT HAND  Result Value Ref Range Status   Specimen Description   Final    BLOOD LEFT HAND Performed at [facility], [address], [place], [place] [address]    Special Requests   Final    BOTTLES DRAWN AEROBIC ONLY Blood Culture results may not be optimal due to an inadequate volume of blood received in culture bottles Performed at [facility], [address] [place], [place] [address]    Culture   Final    NO GROWTH 4 DAYS Performed at [facility], [address] [place], [place] [address]    Report Status PENDING  Incomplete  Urine Culture     Status: Abnormal   Collection Time: 02/18/21 12:48 PM   Specimen: In/Out Cath Urine  Result Value Ref Range Status   Specimen Description   Final    IN/OUT CATH URINE Performed at [facility], [address], [place], [place] [address]    Special Requests   Final    NONE Performed at [facility], [address], [place], [place] [address]    Culture (A)  Final    40,000 COLONIES/mL KLEBSIELLA PNEUMONIAE >=100,000 COLONIES/mL ESCHERICHIA COLI    Report Status 02/21/2021 FINAL  Final   Organism ID, Bacteria KLEBSIELLA PNEUMONIAE (A)  Final   Organism ID, Bacteria ESCHERICHIA COLI (A)  Final      Susceptibility   Escherichia coli - MIC*    AMPICILLIN 8 SENSITIVE Sensitive     CEFAZOLIN <=4 SENSITIVE Sensitive     CEFEPIME <=0.12 SENSITIVE Sensitive      CEFTRIAXONE <=0.25 SENSITIVE Sensitive     CIPROFLOXACIN <=0.25 SENSITIVE Sensitive     GENTAMICIN <=1 SENSITIVE Sensitive     IMIPENEM <=0.25 SENSITIVE Sensitive     NITROFURANTOIN <=16 SENSITIVE Sensitive     TRIMETH/SULFA <=20 SENSITIVE Sensitive  AMPICILLIN/SULBACTAM <=2 SENSITIVE Sensitive     PIP/TAZO <=4 SENSITIVE Sensitive     * >=100,000 COLONIES/mL ESCHERICHIA COLI   Klebsiella pneumoniae - MIC*    AMPICILLIN >=32 RESISTANT Resistant     CEFAZOLIN <=4 SENSITIVE Sensitive     CEFEPIME <=0.12 SENSITIVE Sensitive     CEFTRIAXONE <=0.25 SENSITIVE Sensitive     CIPROFLOXACIN <=0.25 SENSITIVE Sensitive     GENTAMICIN <=1 SENSITIVE Sensitive     IMIPENEM <=0.25 SENSITIVE Sensitive     NITROFURANTOIN 64 INTERMEDIATE Intermediate     TRIMETH/SULFA <=20 SENSITIVE Sensitive     AMPICILLIN/SULBACTAM 8 SENSITIVE Sensitive     PIP/TAZO <=4 SENSITIVE Sensitive     * 40,000 COLONIES/mL KLEBSIELLA PNEUMONIAE      Radiology Studies: IR Sinus/Fist Tube Chk-Non GI  Result Date: 02/23/2021 CLINICAL DATA:  65 year old female with history of recent abdominal wall hernia repair complicated postoperatively by anterior abdominal wall fluid collection status post percutaneous drain placement on 02/11/2021. There has been trace output from the drain over the past several days and recent repeat CT demonstrates no significant residual fluid collection. EXAM: SINUS TRACT INJECTION/FISTULOGRAM COMPARISON:  02/16/2021, 02/11/2021, 02/10/2021 CONTRAST:  5 mL-administered via the existing percutaneous drain. FLUOROSCOPY TIME:  14 seconds, 4 mGy TECHNIQUE: The patient was positioned supine on the fluoroscopy table. A preprocedural spot fluoroscopic image was obtained of the right mid abdomen and the existing percutaneous drainage catheter. Multiple spot fluoroscopic and radiographic images were obtained following the injection of a small amount of contrast via the existing percutaneous drainage catheter. The  external portion of the drain was cut to release the pigtail and the drain was removed without complication. Sterile bandage was applied. FINDINGS: Trace residual fluid collection without evidence of enteric fistula. IMPRESSION: Trace residual fluid collection without evidence of enteric fistula. The drain was removed successfully. Ruthann Cancer, MD [facility] Electronically Signed   By: Ruthann Cancer M.D.   On: 02/23/2021 11:18    Scheduled Meds:  (feeding supplement) PROSource Plus  30 mL Oral BID BM   aspirin EC  81 mg Oral Daily   enoxaparin (LOVENOX) injection  40 mg Subcutaneous Q24H   feeding supplement  237 mL Oral BID BM   insulin aspart  0-5 Units Subcutaneous QHS   insulin aspart  0-9 Units Subcutaneous TID WC   melatonin  3 mg Oral QHS   methocarbamol  500 mg Oral Q6H   metoprolol succinate  50 mg Oral Daily   mometasone-formoterol  2 puff Inhalation BID   multivitamin with minerals  1 tablet Oral Daily   pantoprazole  40 mg Oral Daily   polycarbophil  625 mg Oral BID   polyethylene glycol  17 g Oral Daily   QUEtiapine  12.5 mg Oral QHS   rosuvastatin  10 mg Oral Daily   saccharomyces boulardii  250 mg Oral BID   sodium chloride flush  3 mL Intravenous Q12H   sodium chloride flush  5 mL Intracatheter Daily   Continuous Infusions:  piperacillin-tazobactam (ZOSYN)  IV 3.375 g (02/23/21 0412)     LOS: 16 days   Marylu Lund, MD [facility] Pager On Amion  If 7PM-7AM, please contact night-coverage 02/23/2021, 3:04 PM

## 2021-02-23 NOTE — Procedures (Signed)
Interventional Radiology Procedure Note  Procedure: Abdominal drain injection  Findings: Please refer to procedural dictation for full description. No evidence of enteric fistula.  Trace residual fluid collection.  Drain removed successfully.  Complications: None immediate  Estimated Blood Loss: <5 mL  Recommendations: Follow up with IR as needed.   Marliss Coots, MD Pager: [phone]

## 2021-02-23 NOTE — Progress Notes (Signed)
17 Days Post-Op  Subjective: CC: Patient reports some abdominal pain this am that is similar to yesterday with a soreness in her lower abdomen, more on the right. Tolerating her diet and finished 1/2 lunch and 1/3-1/2 of dinner per patient and her NT. No n/v. Passing flatus. She and her NT report 2 loose bm's yesterday. RN reports that wound vac lost it's seal yesterday night and she had to replace.   Objective: Vital signs in last 24 hours: Temp:  [97.9 F (36.6 C)-98.3 F (36.8 C)] 97.9 F (36.6 C) (11/17 0350) Pulse Rate:  [65-68] 68 (11/17 0350) Resp:  [18-20] 20 (11/17 0350) BP: (154-165)/(67-75) 165/75 (11/17 0350) SpO2:  [96 %-99 %] 99 % (11/17 0350) Last BM Date: 02/21/21  Intake/Output from previous day: 11/16 0701 - 11/17 0700 In: 217.9 [P.O.:110; IV Piggyback:107.9] Out: 1257 [Urine:1000; Drains:257] Vac output increased to 250cc/24 hours.  Intake/Output this shift: No intake/output data recorded.  PE: Gen:  Alert, NAD, pleasant Pulm: Rate and effort normal Abd: vac to midline with good seal. Thin yellow drainage in cannister with thicker white sediment at the bottom of the cannister. Abd soft, nondistended, with mild tenderness of the RLQ without rigidity or guarding. Drain with no fluid in bulb Psych: Alert and oriented to self, states year is 2022, states that she is at [facility] and thinks she is in the hospital for a stroke. Did not recall abdominal surgery.  Midline wound from vac change as noted below. Healing well with mostly healthy granulation tissue throughout (sides and base) with small amount of fibrinous tissue at the inferior aspect of the wound. No dehiscence.     Lab Results:  Recent Labs    02/22/21 0517 02/23/21 0446  WBC 9.1 9.4  HGB 11.5* 12.0  HCT 35.1* 36.6  PLT 627* 551*   BMET Recent Labs    02/22/21 0517 02/23/21 0446  NA 142 140  K 3.2* 3.6  CL 113* 115*  CO2 19* 15*  GLUCOSE 139* 133*  BUN 10 12  CREATININE 1.03*  1.11*  CALCIUM 9.1 9.2   PT/INR No results for input(s): LABPROT, INR in the last 72 hours. CMP     Component Value Date/Time   NA 140 02/23/2021 0446   K 3.6 02/23/2021 0446   CL 115 (H) 02/23/2021 0446   CO2 15 (L) 02/23/2021 0446   GLUCOSE 133 (H) 02/23/2021 0446   BUN 12 02/23/2021 0446   CREATININE 1.11 (H) 02/23/2021 0446   CALCIUM 9.2 02/23/2021 0446   PROT 7.2 02/23/2021 0446   ALBUMIN 3.0 (L) 02/23/2021 0446   AST 37 02/23/2021 0446   ALT 56 (H) 02/23/2021 0446   ALKPHOS 76 02/23/2021 0446   BILITOT 0.4 02/23/2021 0446   GFRNONAA 55 (L) 02/23/2021 0446   GFRAA 33 (L) 07/01/2015 0352   Lipase     Component Value Date/Time   LIPASE 34 02/06/2021 1315    Studies/Results: VAS US CAROTID  Result Date: 02/22/2021 Carotid Arterial Duplex Study Patient Name:  Laurie [name]  Date of Exam:   02/21/2021 Medical Rec #: [id number]            Accession #:    [id number] Date of Birth: 09-02-1955            Patient Gender: F Patient Age:   65 years Exam Location:  [facility] Procedure:      VAS US CAROTID Referring Phys: MCNEILL KIRKPATRICK --------------------------------------------------------------------------------  Indications:  CVA. Risk Factors:      Hypertension. Limitations        Today's exam was limited due to the patient's respiratory                    variation and patient positioning. Comparison Study:  No prior studies. Performing Technologist: Chanda Busing RVT  Examination Guidelines: A complete evaluation includes B-mode imaging, spectral Doppler, color Doppler, and power Doppler as needed of all accessible portions of each vessel. Bilateral testing is considered an integral part of a complete examination. Limited examinations for reoccurring indications may be performed as noted.  Right Carotid Findings: +----------+--------+--------+--------+-----------------------+--------+           PSV cm/sEDV cm/sStenosisPlaque Description     Comments  +----------+--------+--------+--------+-----------------------+--------+ CCA Prox  88      9               smooth and heterogenoustortuous +----------+--------+--------+--------+-----------------------+--------+ CCA Distal45      7               smooth and heterogenous         +----------+--------+--------+--------+-----------------------+--------+ ICA Prox  39      8               smooth and heterogenous         +----------+--------+--------+--------+-----------------------+--------+ ICA Distal49      12                                     tortuous +----------+--------+--------+--------+-----------------------+--------+ ECA       73      6                                               +----------+--------+--------+--------+-----------------------+--------+ +----------+--------+-------+--------+-------------------+           PSV cm/sEDV cmsDescribeArm Pressure (mmHG) +----------+--------+-------+--------+-------------------+ Subclavian115                                        +----------+--------+-------+--------+-------------------+ +---------+--------+--+--------+-+---------+ VertebralPSV cm/s48EDV cm/s7Antegrade +---------+--------+--+--------+-+---------+  Left Carotid Findings: +----------+--------+--------+--------+-----------------------+--------+           PSV cm/sEDV cm/sStenosisPlaque Description     Comments +----------+--------+--------+--------+-----------------------+--------+ CCA Prox  92      14              smooth and heterogenous         +----------+--------+--------+--------+-----------------------+--------+ CCA Distal47      7               smooth and heterogenous         +----------+--------+--------+--------+-----------------------+--------+ ICA Prox  37      8               smooth and heterogenous         +----------+--------+--------+--------+-----------------------+--------+ ICA Distal49      8                                       tortuous +----------+--------+--------+--------+-----------------------+--------+ ECA       53      6                                               +----------+--------+--------+--------+-----------------------+--------+ +----------+--------+--------+--------+-------------------+  PSV cm/sEDV cm/sDescribeArm Pressure (mmHG) +----------+--------+--------+--------+-------------------+ [id number]                                         +----------+--------+--------+--------+-------------------+ +---------+--------+--+--------+-+---------+ VertebralPSV cm/s44EDV cm/s8Antegrade +---------+--------+--+--------+-+---------+   Summary: Right Carotid: Velocities in the right ICA are consistent with a 1-39% stenosis. Left Carotid: Velocities in the left ICA are consistent with a 1-39% stenosis. Vertebrals: Bilateral vertebral arteries demonstrate antegrade flow. *See table(s) above for measurements and observations.  Electronically signed by Delia Heady MD on 02/22/2021 at 8:22:23 AM.    Final     Anti-infectives: Anti-infectives (From admission, onward)    Start     Dose/Rate Route Frequency Ordered Stop   02/12/21 1200  piperacillin-tazobactam (ZOSYN) IVPB 3.375 g        3.375 g 12.5 mL/hr over 240 Minutes Intravenous Every 8 hours 02/12/21 0950     02/06/21 2003  ceFAZolin (ANCEF) 2-4 GM/100ML-% IVPB       Note to Pharmacy: Ponciano Ort   : cabinet override      02/06/21 2003 02/06/21 2046   02/06/21 1945  ceFAZolin (ANCEF) IVPB 2g/100 mL premix        2 g 200 mL/hr over 30 Minutes Intravenous On call to O.R. 02/06/21 1914 02/06/21 2028        Assessment/Plan Ventral hernia with SBO POD#17 S/P ventral hernia repair with small bowel resection, LOA 10/31 Dr. Sheliah Hatch - s/p IR drain 11/5, culture with somewhat resistant E coli but sensitive to zosyn - continue  - CT 11/10 showed resolution of drained fluid collection with drain in  place and a few dots of residual air, no new concerning findings. No definite enterocutaneous fistula but is high risk. - Drain with minimal output x4 days, IR planning drain injection study - Wound vac MWF - monitor output. Changed overnight by bedside RN after loosing seal. Plan to change again tomorrow. - PT/OT, encourage mobilization - still recommending HH PT/OT - Encourage PO intake and continue bowel regimen, dietician following - Overall stable from a surgical standpoint. Timing of d/c per primary    FEN: regular diet, Ensure, prostat VTE: lovenox, ASA ID: Ancef pre-op, zosyn 11/6>>day#12. WBC wnl, afebrile.   Per [facility] New CVA - Neurology following and working up Hx of cystectomy with urostomy in place - large stone noted in ileal conduit on CT, OP follow up with [facility] urology recommended  AKI on CKD T2DM with gastroparesis  HTN Chronic candidal esophagitis  GERD Hx of Hep C Glaucoma  B12 deficiency and iron deficiency anemia  CAD Hx of asthma     LOS: 16 days    Jacinto Halim , [facility] 02/23/2021, 8:42 AM Please see Amion for pager number during day hours 7:00am-4:30pm

## 2021-02-23 NOTE — Progress Notes (Signed)
Physical Therapy Treatment Patient Details Name: Laurie [name] MRN: [id number] DOB: June 17, 1955 Today's Date: 02/23/2021   History of Present Illness Laurie [name] is a 65 y.o. female who presented to the ED 02/06/21 with abdominal pain, CT revealed an incarcerated ventral hernia. Pt procedures: 02/06/21 small bowell resection, hernia repair and lysis of adhesions, 11/4 wound vac placement, 11/5 drain placement for anterior peirtoneal abcess. On 11/14 Neuroimaging for encephalopathy showed R PCA subacute stroke with petechial hemorrhage and acute punctate cortical ischemic strokes in both hemispheres. PMH significant for  T2DM, HTN, HLD, anxiety/depression, GERD, obesity and multiple abdominopelvic surgeries.    PT Comments    Patient making good progress and fewer cues needed for log roll and to sit up EOB, however pt does require slight assist due to abdominal pain. Pt ambulated increased distance today of ~180' with RW and no LOB noted. EOS instructed in functional LE strengthening with sit<>stands and no UE use to facilitate LE muscle activation. She will benefit from HHPT initially with transition to OPPT for more intense strengthening. Acute PT will continue to progress her as able.    Recommendations for follow up therapy are one component of a multi-disciplinary discharge planning process, led by the attending physician.  Recommendations may be updated based on patient status, additional functional criteria and insurance authorization.  Follow Up Recommendations        Assistance Recommended at Discharge Intermittent Supervision/Assistance  Equipment Recommendations  None recommended by PT    Recommendations for Other Services       Precautions / Restrictions Precautions Precautions: Fall Precaution Comments: abdomen, wound vac and JP drain on R Restrictions Weight Bearing Restrictions: No     Mobility  Bed Mobility Overal bed mobility: Needs Assistance Bed  Mobility: Rolling;Sit to Sidelying;Sidelying to Sit Rolling: Supervision Sidelying to sit: Min guard;HOB elevated     Sit to sidelying: Min assist General bed mobility comments: pt using bed rail and light assist/cues needed to complete log roll technique with supine>sit.    Transfers Overall transfer level: Needs assistance Equipment used: Rolling walker (2 wheels) Transfers: Sit to/from Stand Sit to Stand: Min guard           General transfer comment: guarding for safety with power up from EOB and toilet. pt using grab bar in bathroom to control lowering.    Ambulation/Gait Ambulation/Gait assistance: Min guard Gait Distance (Feet): 180 Feet Assistive device: Rolling walker (2 wheels) Gait Pattern/deviations: Decreased stride length;Narrow base of support;Step-through pattern;Shuffle;Trunk flexed Gait velocity: decr     General Gait Details: pt tolerated increased distance today, no overt LOB and pt maintianed safe proximity to RW throughout. no c/o dizziness this session and VSS.   Stairs             Wheelchair Mobility    Modified Rankin (Stroke Patients Only)       Balance Overall balance assessment: Needs assistance Sitting-balance support: Feet supported Sitting balance-Leahy Scale: Good     Standing balance support: During functional activity;Bilateral upper extremity supported;Reliant on assistive device for balance Standing balance-Leahy Scale: Fair                              Cognition Arousal/Alertness: Awake/alert Behavior During Therapy: WFL for tasks assessed/performed Overall Cognitive Status: Within Functional Limits for tasks assessed  General Comments: pt somewhat tangental in conversation and seems slightly confused and fatigued at EOS.        Exercises Other Exercises Other Exercises: 2x5 reps Sit<>Stand from recliner, no UE use for power up.    General Comments         Pertinent Vitals/Pain Pain Assessment: Faces Faces Pain Scale: Hurts a little bit Pain Location: abdomen Pain Descriptors / Indicators: Discomfort Pain Intervention(s): Limited activity within patient's tolerance;Monitored during session;Repositioned    Home Living                          Prior Function            PT Goals (current goals can now be found in the care plan section) Acute Rehab PT Goals Patient Stated Goal: Less pain and go home PT Goal Formulation: With patient/family Time For Goal Achievement: 03/07/21 (extended) Potential to Achieve Goals: Good Progress towards PT goals: Progressing toward goals    Frequency    Min 3X/week      PT Plan Current plan remains appropriate    Co-evaluation              AM-PAC PT "6 Clicks" Mobility   Outcome Measure  Help needed turning from your back to your side while in a flat bed without using bedrails?: A Little Help needed moving from lying on your back to sitting on the side of a flat bed without using bedrails?: A Little Help needed moving to and from a bed to a chair (including a wheelchair)?: A Little Help needed standing up from a chair using your arms (e.g., wheelchair or bedside chair)?: A Little Help needed to walk in hospital room?: A Little Help needed climbing 3-5 steps with a railing? : A Lot 6 Click Score: 17    End of Session Equipment Utilized During Treatment: Gait belt Activity Tolerance: Patient tolerated treatment well Patient left: in bed;with call bell/phone within reach;with bed alarm set Nurse Communication: Mobility status PT Visit Diagnosis: Unsteadiness on feet (R26.81);Pain     Time: 1201-1229 PT Time Calculation (min) (ACUTE ONLY): 28 min  Charges:  $Gait Training: 8-22 mins $Therapeutic Activity: 8-22 mins                     Wynn Maudlin, DPT Acute Rehabilitation Services Office [phone] Pager [phone]    Anitra Lauth 02/23/2021,  4:48 PM

## 2021-02-23 NOTE — H&P (View-Only) (Signed)
PROGRESS NOTE    Laurie [name]  [id number] DOB: March 02, 1956 DOA: 02/06/2021 PCP: Cindra Eves, MD    Brief NarrativeCV:[id number] w/ a hx of DM2, HTN, HLD, anxiety/depression, GERD, obesity, and multiple abdomino-pelvic surgeries who presented to the ED with 5 days of worsening lower abdominal pain associated with loose stools and vomiting. In the ED CT revealed an incarcerated ventral hernia. EDP attempted reduction though symptoms did not improve. Labs notable for acute renal failure in the setting of dehydration. She was transferred to [facility] ED for surgical consult, and repeat CT abdomen showed persistent hernia with bowel obstruction for which she was taken to the OR.  Assessment & Plan:   Active Problems:   CAD (coronary artery disease)   Essential hypertension   Acute renal failure (ARF) (HCC)   Diabetes mellitus without complication (HCC)   Incarcerated ventral hernia   SBO (small bowel obstruction) (HCC)   Obesity   Malnutrition of moderate degree  Incarcerated ventral hernia with SBO Anterior peirtoneal abscess Currently afebrile with resolved leukocytosis Status post repair with small bowel resection and LOA 10/31 Persisting abdominal discomfort and climbing WBC raised concern leading to CT abdomen pelvis 11/4 which noted questionable fluid collection within the abdomen which was drained by IR on 11/5 Continue IV Zosyn as per gen surgery Given minimal output, IR plan for drain injection study -Cont wound vac MWF per General Surgery   Acute renal failure- improved Metabolic acidosis Acute urinary retention- resolved (pt self cath) Cr peaked to 1.34, showing improvement In and out prn (pt reports self cath about 3 times a day due to urinary diversion)   E.coli UTI Urine culture >100,000 E.coli, 40,000 klebsiella pneumoniae  Continued on IV zosyn Afebrile   Hypokalemia Will replace Daily CMP   Acute CVA MRI showed acute ischemic nonhemorrhagic cortical  infarct cortical infarct, evolving late subacute right PCA territory infarct involving the right occipital lobe, associated petechial blood products without frank intraparenchymal hematoma or significant mass-effect MRA head and neck no emergent findings Echo with EF 65 to [id number], grade 1 diastolic dysfunction, bubble study negative with no evidence of inter-atrial shunt LDL, 97, A1c--> 6.2 Start ASA, continue home crestor PT/OT Telemetry Neurology consulted- suspicious of embolic in nature, rec further work up Pending TEE, outpt cardiac monitoring, on chart review, TEE  seems to be scheduled for [id number]   Acute metabolic encephalopathy Improving Likely due to UTI vs CVA as above Afebrile, with resolved leukocytosis BC X 2 NGTD Abd xray with distended urinary conduit Recent CXR unremarkable Answering questions appropriately   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 Terance Hart at [facility] - history of cystectomy and right colon pouch for interstitial cystitis 1990s - pouch stone is found to be nonobstructing   DM2 Continue with SSI, accuchecks   HTN BP stable   COPD Stable Continue usual Dulera and as needed albuterol   GERD PPI   Obesity - Body mass index is 34.33 kg/m. Recommend continued diet and lifestyle modification   DVT prophylaxis: Lovenox subq Code Status: Full Family Communication: Pt in room, family not at bedside  Status is: Inpatient  Remains inpatient appropriate because: Severity of illness   Consultants:  General Surgery  IR  Procedures:  10/31 ventral hernia repair with small bowel resection and LOA 11/5 CT-guided drainage of anterior peritoneal abscess in IR -10 French drain left in place none  Antimicrobials: Anti-infectives (From admission, onward)    Start     Dose/Rate  Route Frequency Ordered Stop   02/12/21 1200  piperacillin-tazobactam (ZOSYN) IVPB 3.375 g        3.375 g 12.5 mL/hr over 240 Minutes  Intravenous Every 8 hours 02/12/21 0950     02/06/21 2003  ceFAZolin (ANCEF) 2-4 GM/100ML-% IVPB       Note to Pharmacy: Cleda Daub   : cabinet override      02/06/21 2003 02/06/21 2046   02/06/21 1945  ceFAZolin (ANCEF) IVPB 2g/100 mL premix        2 g 200 mL/hr over 30 Minutes Intravenous On call to O.R. 02/06/21 1914 02/06/21 2028       Subjective: States having mild abd discomfort  Objective: Vitals:   02/22/21 1500 02/22/21 2031 02/23/21 0350 02/23/21 0903  BP: (!) 154/68 (!) 155/67 (!) 165/75   Pulse: 67 65 68   Resp: 18 20 20    Temp: 98.3 F (36.8 C) 97.9 F (36.6 C) 97.9 F (36.6 C)   TempSrc: Oral Oral Oral   SpO2: 96% 99% 99% 98%  Weight:      Height:        Intake/Output Summary (Last 24 hours) at 02/23/2021 1504 Last data filed at 02/23/2021 0600 Gross per 24 hour  Intake 107.93 ml  Output 257 ml  Net -149.07 ml    Filed Weights   02/06/21 1053  Weight: 90.7 kg    Examination: General exam: Conversant, in no acute distress Respiratory system: normal chest rise, clear, no audible wheezing Cardiovascular system: regular rhythm, s1-s2 Gastrointestinal system: Nondistended, nontender, pos BS Central nervous system: No seizures, no tremors Extremities: No cyanosis, no joint deformities Skin: No rashes, no pallor Psychiatry: Affect normal // no auditory hallucinations    Data Reviewed: I have personally reviewed following labs and imaging studies  CBC: Recent Labs  Lab 02/19/21 0144 02/20/21 0509 02/21/21 0502 02/22/21 0517 02/23/21 0446  WBC 10.9* 7.6 8.0 9.1 9.4  NEUTROABS 7.3 4.9 5.6 6.2 6.2  HGB 10.8* 10.4* 10.9* 11.5* 12.0  HCT 33.3* 31.8* 33.7* 35.1* 36.6  MCV 99.4 98.5 99.7 98.0 98.7  PLT 548* 581* 579* 627* 551*    Basic Metabolic Panel: Recent Labs  Lab 02/18/21 1136 02/19/21 0144 02/20/21 0509 02/21/21 0502 02/22/21 0517 02/23/21 0446  NA 138 142 143 143 142 140  K 3.5 3.3* 3.6 3.1* 3.2* 3.6  CL 112* 114* 116* 117*  113* 115*  CO2 15* 17* 17* 16* 19* 15*  GLUCOSE 157* 128* 106* 135* 139* 133*  BUN 19 20 16 12 10 12   CREATININE 1.34* 1.32* 1.09* 0.91 1.03* 1.11*  CALCIUM 9.4 9.3 9.1 9.1 9.1 9.2  MG 1.9  --   --  1.8  --   --     GFR: Estimated Creatinine Clearance: 54.4 mL/min (A) (by C-G formula based on SCr of 1.11 mg/dL (H)). Liver Function Tests: Recent Labs  Lab 02/19/21 0144 02/20/21 0509 02/21/21 0502 02/22/21 0517 02/23/21 0446  AST 78* 73* 57* 45* 37  ALT 68* 72* 67* 63* 56*  ALKPHOS 75 74 78 82 76  BILITOT 0.6 0.5 0.6 0.6 0.4  PROT 7.4 7.0 7.2 7.5 7.2  ALBUMIN 2.8* 2.7* 2.9* 3.1* 3.0*    No results for input(s): LIPASE, AMYLASE in the last 168 hours. No results for input(s): AMMONIA in the last 168 hours. Coagulation Profile: No results for input(s): INR, PROTIME in the last 168 hours. Cardiac Enzymes: No results for input(s): CKTOTAL, CKMB, CKMBINDEX, TROPONINI in the last 168 hours. BNP (  last 3 results) No results for input(s): PROBNP in the last 8760 hours. HbA1C: No results for input(s): HGBA1C in the last 72 hours. CBG: Recent Labs  Lab 02/22/21 1142 02/22/21 1652 02/22/21 2033 02/23/21 0825 02/23/21 1326  GLUCAP 183* 202* 151* 148* 212*    Lipid Profile: No results for input(s): CHOL, HDL, LDLCALC, TRIG, CHOLHDL, LDLDIRECT in the last 72 hours. Thyroid Function Tests: No results for input(s): TSH, T4TOTAL, FREET4, T3FREE, THYROIDAB in the last 72 hours. Anemia Panel: No results for input(s): VITAMINB12, FOLATE, FERRITIN, TIBC, IRON, RETICCTPCT in the last 72 hours. Sepsis Labs: No results for input(s): PROCALCITON, LATICACIDVEN in the last 168 hours.  Recent Results (from the past 240 hour(s))  Culture, blood (routine x 2)     Status: None (Preliminary result)   Collection Time: 02/18/21 12:04 PM   Specimen: BLOOD  Result Value Ref Range Status   Specimen Description   Final    BLOOD RIGHT ANTECUBITAL Performed at [facility], [address], [place], [place] [address]    Special Requests   Final    BOTTLES DRAWN AEROBIC ONLY Blood Culture adequate volume Performed at [facility], [address] [place], [place] [address]    Culture   Final    NO GROWTH 4 DAYS Performed at [facility], [address] [place], [place] [address]    Report Status PENDING  Incomplete  Culture, blood (routine x 2)     Status: None (Preliminary result)   Collection Time: 02/18/21 12:04 PM   Specimen: BLOOD LEFT HAND  Result Value Ref Range Status   Specimen Description   Final    BLOOD LEFT HAND Performed at [facility], [address], [place], [place] [address]    Special Requests   Final    BOTTLES DRAWN AEROBIC ONLY Blood Culture results may not be optimal due to an inadequate volume of blood received in culture bottles Performed at [facility], [address] [place], [place] [address]    Culture   Final    NO GROWTH 4 DAYS Performed at [facility], [address] [place], [place] [address]    Report Status PENDING  Incomplete  Urine Culture     Status: Abnormal   Collection Time: 02/18/21 12:48 PM   Specimen: In/Out Cath Urine  Result Value Ref Range Status   Specimen Description   Final    IN/OUT CATH URINE Performed at [facility], [address], [place], [place] [address]    Special Requests   Final    NONE Performed at [facility], [address], [place], [place] [address]    Culture (A)  Final    40,000 COLONIES/mL KLEBSIELLA PNEUMONIAE >=100,000 COLONIES/mL ESCHERICHIA COLI    Report Status 02/21/2021 FINAL  Final   Organism ID, Bacteria KLEBSIELLA PNEUMONIAE (A)  Final   Organism ID, Bacteria ESCHERICHIA COLI (A)  Final      Susceptibility   Escherichia coli - MIC*    AMPICILLIN 8 SENSITIVE Sensitive     CEFAZOLIN <=4 SENSITIVE Sensitive     CEFEPIME <=0.12 SENSITIVE Sensitive      CEFTRIAXONE <=0.25 SENSITIVE Sensitive     CIPROFLOXACIN <=0.25 SENSITIVE Sensitive     GENTAMICIN <=1 SENSITIVE Sensitive     IMIPENEM <=0.25 SENSITIVE Sensitive     NITROFURANTOIN <=16 SENSITIVE Sensitive     TRIMETH/SULFA <=20 SENSITIVE Sensitive  AMPICILLIN/SULBACTAM <=2 SENSITIVE Sensitive     PIP/TAZO <=4 SENSITIVE Sensitive     * >=100,000 COLONIES/mL ESCHERICHIA COLI   Klebsiella pneumoniae - MIC*    AMPICILLIN >=32 RESISTANT Resistant     CEFAZOLIN <=4 SENSITIVE Sensitive     CEFEPIME <=0.12 SENSITIVE Sensitive     CEFTRIAXONE <=0.25 SENSITIVE Sensitive     CIPROFLOXACIN <=0.25 SENSITIVE Sensitive     GENTAMICIN <=1 SENSITIVE Sensitive     IMIPENEM <=0.25 SENSITIVE Sensitive     NITROFURANTOIN 64 INTERMEDIATE Intermediate     TRIMETH/SULFA <=20 SENSITIVE Sensitive     AMPICILLIN/SULBACTAM 8 SENSITIVE Sensitive     PIP/TAZO <=4 SENSITIVE Sensitive     * 40,000 COLONIES/mL KLEBSIELLA PNEUMONIAE      Radiology Studies: IR Sinus/Fist Tube Chk-Non GI  Result Date: 02/23/2021 CLINICAL DATA:  65 year old female with history of recent abdominal wall hernia repair complicated postoperatively by anterior abdominal wall fluid collection status post percutaneous drain placement on 02/11/2021. There has been trace output from the drain over the past several days and recent repeat CT demonstrates no significant residual fluid collection. EXAM: SINUS TRACT INJECTION/FISTULOGRAM COMPARISON:  02/16/2021, 02/11/2021, 02/10/2021 CONTRAST:  5 mL-administered via the existing percutaneous drain. FLUOROSCOPY TIME:  14 seconds, 4 mGy TECHNIQUE: The patient was positioned supine on the fluoroscopy table. A preprocedural spot fluoroscopic image was obtained of the right mid abdomen and the existing percutaneous drainage catheter. Multiple spot fluoroscopic and radiographic images were obtained following the injection of a small amount of contrast via the existing percutaneous drainage catheter. The  external portion of the drain was cut to release the pigtail and the drain was removed without complication. Sterile bandage was applied. FINDINGS: Trace residual fluid collection without evidence of enteric fistula. IMPRESSION: Trace residual fluid collection without evidence of enteric fistula. The drain was removed successfully. Ruthann Cancer, MD [facility] Electronically Signed   By: Ruthann Cancer M.D.   On: 02/23/2021 11:18    Scheduled Meds:  (feeding supplement) PROSource Plus  30 mL Oral BID BM   aspirin EC  81 mg Oral Daily   enoxaparin (LOVENOX) injection  40 mg Subcutaneous Q24H   feeding supplement  237 mL Oral BID BM   insulin aspart  0-5 Units Subcutaneous QHS   insulin aspart  0-9 Units Subcutaneous TID WC   melatonin  3 mg Oral QHS   methocarbamol  500 mg Oral Q6H   metoprolol succinate  50 mg Oral Daily   mometasone-formoterol  2 puff Inhalation BID   multivitamin with minerals  1 tablet Oral Daily   pantoprazole  40 mg Oral Daily   polycarbophil  625 mg Oral BID   polyethylene glycol  17 g Oral Daily   QUEtiapine  12.5 mg Oral QHS   rosuvastatin  10 mg Oral Daily   saccharomyces boulardii  250 mg Oral BID   sodium chloride flush  3 mL Intravenous Q12H   sodium chloride flush  5 mL Intracatheter Daily   Continuous Infusions:  piperacillin-tazobactam (ZOSYN)  IV 3.375 g (02/23/21 0412)     LOS: 16 days   Marylu Lund, MD [facility] Pager On Amion  If 7PM-7AM, please contact night-coverage 02/23/2021, 3:04 PM

## 2021-02-24 ENCOUNTER — Inpatient Hospital Stay (HOSPITAL_COMMUNITY): Payer: Medicare PPO

## 2021-02-24 ENCOUNTER — Encounter (HOSPITAL_COMMUNITY)
Admission: EM | Disposition: A | Discharge status: Home Health | Payer: Self-pay | Source: Home / Self Care | Attending: Internal Medicine

## 2021-02-24 ENCOUNTER — Encounter (HOSPITAL_COMMUNITY): Payer: Self-pay | Admitting: Family Medicine

## 2021-02-24 ENCOUNTER — Inpatient Hospital Stay (HOSPITAL_COMMUNITY): Payer: Medicare PPO | Admitting: Anesthesiology

## 2021-02-24 DIAGNOSIS — I10 Essential (primary) hypertension: Secondary | ICD-10-CM

## 2021-02-24 DIAGNOSIS — I639 Cerebral infarction, unspecified: Secondary | ICD-10-CM

## 2021-02-24 HISTORY — PX: BUBBLE STUDY: SHX6837

## 2021-02-24 HISTORY — PX: TEE WITHOUT CARDIOVERSION: SHX5443

## 2021-02-24 LAB — COMPREHENSIVE METABOLIC PANEL
ALT: 48 U/L — ABNORMAL HIGH (ref 0–44)
AST: 31 U/L (ref 15–41)
Albumin: 3 g/dL — ABNORMAL LOW (ref 3.5–5.0)
Alkaline Phosphatase: 78 U/L (ref 38–126)
Anion gap: 10 (ref 5–15)
BUN: 15 mg/dL (ref 8–23)
CO2: 18 mmol/L — ABNORMAL LOW (ref 22–32)
Calcium: 9.7 mg/dL (ref 8.9–10.3)
Chloride: 115 mmol/L — ABNORMAL HIGH (ref 98–111)
Creatinine, Ser: 1.14 mg/dL — ABNORMAL HIGH (ref 0.44–1.00)
GFR, Estimated: 53 mL/min — ABNORMAL LOW (ref >60)
Glucose, Bld: 163 mg/dL — ABNORMAL HIGH (ref 70–99)
Potassium: 3.9 mmol/L (ref 3.5–5.1)
Sodium: 143 mmol/L (ref 135–145)
Total Bilirubin: 0.3 mg/dL (ref 0.3–1.2)
Total Protein: 7.3 g/dL (ref 6.5–8.1)

## 2021-02-24 LAB — CBC WITH DIFFERENTIAL/PLATELET
Abs Immature Granulocytes: 0.05 10*3/uL (ref 0.00–0.07)
Basophils Absolute: 0 10*3/uL (ref 0.0–0.1)
Basophils Relative: 1 %
Eosinophils Absolute: 0.2 10*3/uL (ref 0.0–0.5)
Eosinophils Relative: 3 %
HCT: 36.2 % (ref 36.0–46.0)
Hemoglobin: 11.8 g/dL — ABNORMAL LOW (ref 12.0–15.0)
Immature Granulocytes: 1 %
Lymphocytes Relative: 18 %
Lymphs Abs: 1.3 10*3/uL (ref 0.7–4.0)
MCH: 32.3 pg (ref 26.0–34.0)
MCHC: 32.6 g/dL (ref 30.0–36.0)
MCV: 99.2 fL (ref 80.0–100.0)
Monocytes Absolute: 0.8 10*3/uL (ref 0.1–1.0)
Monocytes Relative: 10 %
Neutro Abs: 5.1 10*3/uL (ref 1.7–7.7)
Neutrophils Relative %: 67 %
Platelets: 636 10*3/uL — ABNORMAL HIGH (ref 150–400)
RBC: 3.65 MIL/uL — ABNORMAL LOW (ref 3.87–5.11)
RDW: 15.3 % (ref 11.5–15.5)
WBC: 7.4 10*3/uL (ref 4.0–10.5)
nRBC: 0 % (ref 0.0–0.2)

## 2021-02-24 LAB — GLUCOSE, CAPILLARY
Glucose-Capillary: 140 mg/dL — ABNORMAL HIGH (ref 70–99)
Glucose-Capillary: 147 mg/dL — ABNORMAL HIGH (ref 70–99)
Glucose-Capillary: 121 mg/dL — ABNORMAL HIGH (ref 70–99)
Glucose-Capillary: 167 mg/dL — ABNORMAL HIGH (ref 70–99)

## 2021-02-24 SURGERY — ECHOCARDIOGRAM, TRANSESOPHAGEAL
Anesthesia: Monitor Anesthesia Care

## 2021-02-24 MED ORDER — SODIUM CHLORIDE 0.9 % IV SOLN: INTRAVENOUS | Status: DC | PRN

## 2021-02-24 MED ORDER — BUTAMBEN-TETRACAINE-BENZOCAINE 2-2-14 % EX AERO
INHALATION_SPRAY | CUTANEOUS | Status: DC | PRN
  Administered 2021-02-24: 2 via TOPICAL

## 2021-02-24 MED ORDER — PHENYLEPHRINE HCL-NACL 20-0.9 MG/250ML-% IV SOLN
INTRAVENOUS | Status: DC | PRN
  Administered 2021-02-24: 20 ug/min via INTRAVENOUS

## 2021-02-24 MED ORDER — PROPOFOL 500 MG/50ML IV EMUL
INTRAVENOUS | Status: DC | PRN
  Administered 2021-02-24: 75 ug/kg/min via INTRAVENOUS

## 2021-02-24 MED ORDER — PROPOFOL 10 MG/ML IV BOLUS
INTRAVENOUS | Status: DC | PRN
  Administered 2021-02-24: 100 mg via INTRAVENOUS

## 2021-02-24 MED ORDER — SODIUM CHLORIDE 0.9 % IV SOLN: INTRAVENOUS | Status: DC

## 2021-02-24 MED ORDER — LIDOCAINE 2% (20 MG/ML) 5 ML SYRINGE
INTRAMUSCULAR | Status: DC | PRN
  Administered 2021-02-24: 50 mg via INTRAVENOUS

## 2021-02-24 NOTE — TOC Progression Note (Signed)
Transition of Care [facility]) - Progression Note    Patient Details  Name: Laurie [name] MRN: [id number] Date of Birth: Jan 31, 1956  Transition of Care [facility] CM/SW Contact  Ida Rogue, [place] Phone Number: 02/24/2021, 11:45 AM  Clinical Narrative:   Spoke with sister Ms Lianne Cure who confirmed that plan is still to take patient home with [facility] services. TOC will continue to follow during the course of hospitalization.     Expected Discharge Plan: Home w Home Health Services Barriers to Discharge: No Barriers Identified  Expected Discharge Plan and Services Expected Discharge Plan: Home w Home Health Services   Discharge Planning Services: CM Consult Post Acute Care Choice: Home Health Living arrangements for the past 2 months: Single Family Home                                       Social Determinants of Health (SDOH) Interventions    Readmission Risk Interventions No flowsheet data found.

## 2021-02-24 NOTE — Progress Notes (Signed)
    Transesophageal Echocardiogram Note  Laurie [name] [id number] [birth year]  Procedure: Transesophageal Echocardiogram Indications: CVA   Procedure Details Consent: Obtained Time Out: Verified patient identification, verified procedure, site/side was marked, verified correct patient position, special equipment/implants available, Radiology Safety Procedures followed,  medications/allergies/relevent history reviewed, required imaging and test results available.  Performed  Medications:  Pt sedated by anesthesia with lidocaine 50 mg and diprovan 192mg  IV.  Normal LV function; negative saline microcavitation study.   Complications: No apparent complications Patient did tolerate procedure well.  , MD

## 2021-02-24 NOTE — Progress Notes (Signed)
18 Days Post-Op  Subjective: CC: Drain study yesterday without enteric fistula. Drain removed by IR. She reports increased right sided abdominal pain yesterday, loss of appetite and nausea without emesis. She did tolerate some po intake without emesis. Continues to pass flatus and had a liquid BM yesterday. Afebrile. WBC wnl.  Objective: Vital signs in last 24 hours: Temp:  [97.9 F (36.6 C)-98.1 F (36.7 C)] 98.1 F (36.7 C) (11/18 0458) Pulse Rate:  [62-69] 62 (11/18 0458) Resp:  [20] 20 (11/18 0458) BP: (140-152)/(64-72) 152/64 (11/18 0458) SpO2:  [96 %-100 %] 96 % (11/18 0922) Last BM Date: 02/22/21  Intake/Output from previous day: 11/17 0701 - 11/18 0700 In: 27.8 [IV Piggyback:27.8] Out: 1200 [Urine:1200] Intake/Output this shift: No intake/output data recorded.  PE: Gen:  Alert, NAD, pleasant Pulm: Rate and effort normal Abd: Vac to midline with good seal. Thin yellow drainage in cannister with thicker white sediment at the bottom of the cannister, similar to yesterday. Abd soft, nondistended, with right sided tenderness without rigidity or guarding. Drain site with dressing in place, c/d/i  Lab Results:  Recent Labs    02/23/21 0446 02/24/21 0438  WBC 9.4 7.4  HGB 12.0 11.8*  HCT 36.6 36.2  PLT 551* 636*   BMET Recent Labs    02/23/21 0446 02/24/21 0438  NA 140 143  K 3.6 3.9  CL 115* 115*  CO2 15* 18*  GLUCOSE 133* 163*  BUN 12 15  CREATININE 1.11* 1.14*  CALCIUM 9.2 9.7   PT/INR No results for input(s): LABPROT, INR in the last 72 hours. CMP     Component Value Date/Time   NA 143 02/24/2021 0438   K 3.9 02/24/2021 0438   CL 115 (H) 02/24/2021 0438   CO2 18 (L) 02/24/2021 0438   GLUCOSE 163 (H) 02/24/2021 0438   BUN 15 02/24/2021 0438   CREATININE 1.14 (H) 02/24/2021 0438   CALCIUM 9.7 02/24/2021 0438   PROT 7.3 02/24/2021 0438   ALBUMIN 3.0 (L) 02/24/2021 0438   AST 31 02/24/2021 0438   ALT 48 (H) 02/24/2021 0438   ALKPHOS 78  02/24/2021 0438   BILITOT 0.3 02/24/2021 0438   GFRNONAA 53 (L) 02/24/2021 0438   GFRAA 33 (L) 07/01/2015 0352   Lipase     Component Value Date/Time   LIPASE 34 02/06/2021 1315    Studies/Results: IR Sinus/Fist Tube Chk-Non GI  Result Date: 02/23/2021 CLINICAL DATA:  65 year old female with history of recent abdominal wall hernia repair complicated postoperatively by anterior abdominal wall fluid collection status post percutaneous drain placement on 02/11/2021. There has been trace output from the drain over the past several days and recent repeat CT demonstrates no significant residual fluid collection. EXAM: SINUS TRACT INJECTION/FISTULOGRAM COMPARISON:  02/16/2021, 02/11/2021, 02/10/2021 CONTRAST:  5 mL-administered via the existing percutaneous drain. FLUOROSCOPY TIME:  14 seconds, 4 mGy TECHNIQUE: The patient was positioned supine on the fluoroscopy table. A preprocedural spot fluoroscopic image was obtained of the right mid abdomen and the existing percutaneous drainage catheter. Multiple spot fluoroscopic and radiographic images were obtained following the injection of a small amount of contrast via the existing percutaneous drainage catheter. The external portion of the drain was cut to release the pigtail and the drain was removed without complication. Sterile bandage was applied. FINDINGS: Trace residual fluid collection without evidence of enteric fistula. IMPRESSION: Trace residual fluid collection without evidence of enteric fistula. The drain was removed successfully. Marliss Coots, MD [facility]  Radiology Electronically Signed   By: Marliss Coots M.D.   On: 02/23/2021 11:18    Anti-infectives: Anti-infectives (From admission, onward)    Start     Dose/Rate Route Frequency Ordered Stop   02/12/21 1200  piperacillin-tazobactam (ZOSYN) IVPB 3.375 g        3.375 g 12.5 mL/hr over 240 Minutes Intravenous Every 8 hours 02/12/21 0950      02/06/21 2003  ceFAZolin (ANCEF) 2-4 GM/100ML-% IVPB       Note to Pharmacy: Ponciano Ort   : cabinet override      02/06/21 2003 02/06/21 2046   02/06/21 1945  ceFAZolin (ANCEF) IVPB 2g/100 mL premix        2 g 200 mL/hr over 30 Minutes Intravenous On call to O.R. 02/06/21 1914 02/06/21 2028        Assessment/Plan Ventral hernia with SBO POD#18 S/P ventral hernia repair with small bowel resection, LOA 10/31 Dr. Sheliah Hatch - s/p IR drain 11/5, culture with somewhat resistant E coli but sensitive to zosyn - continue for now. She will complete 14days of abx 11/19  - CT 11/10 showed resolution of drained fluid collection with drain in place and a few dots of residual air, no new concerning findings. No definite enterocutaneous fistula but is high risk. - Drain study yesterday without enteric fistula. Drain removed. - Wound vac MWF - I have asked bedside RN to let me know when she plans to change this so I can look at it with her - PT/OT, encourage mobilization - still recommending HH PT/OT - Encourage PO intake and continue bowel regimen, dietician following - Some increased pain overnight. Repeat labs in AM and monitor.   FEN: NPO for TEE, okay for reg diet after.  VTE: lovenox, ASA ID: Ancef pre-op, zosyn 11/6 - 11/19. WBC wnl, afebrile.  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

## 2021-02-24 NOTE — Interval H&P Note (Signed)
History and Physical Interval Note:  02/24/2021 12:50 PM  Laurie [name]  has presented today for surgery, with the diagnosis of BACTEREMIA.  The various methods of treatment have been discussed with the patient and family. After consideration of risks, benefits and other options for treatment, the patient has consented to  Procedure(s): TRANSESOPHAGEAL ECHOCARDIOGRAM (TEE) (N/A) as a surgical intervention.  The patient's history has been reviewed, patient examined, no change in status, stable for surgery.  I have reviewed the patient's chart and labs.  Questions were answered to the patient's satisfaction.     Olga Millers

## 2021-02-24 NOTE — Progress Notes (Signed)
  Echocardiogram Echocardiogram Transesophageal has been performed.  Leta Jungling M 02/24/2021, 1:57 PM

## 2021-02-24 NOTE — Progress Notes (Addendum)
Midline wound from vac change. No major changes from picture on 11/16. Healing well with mostly healthy granulation tissue throughout (sides and base) with small amount of fibrinous tissue at the inferior aspect of the wound. No dehiscence. No significant drainage. Periwound clean without signs of cellulitis. Continue wound vac - reapplied.   Leary Roca, [facility] Surgery 4:42 PM, 02/24/2021

## 2021-02-24 NOTE — Progress Notes (Signed)
Physical Therapy Treatment Patient Details Name: Laurie [name] MRN: [id number] DOB: 02-17-1956 Today's Date: 02/24/2021   History of Present Illness Laurie [name] is a 65 y.o. female who presented to the ED 02/06/21 with abdominal pain, CT revealed an incarcerated ventral hernia. Pt procedures: 02/06/21 small bowell resection, hernia repair and lysis of adhesions, 11/4 wound vac placement, 11/5 drain placement for anterior peirtoneal abcess. On 11/14 Neuroimaging for encephalopathy showed R PCA subacute stroke with petechial hemorrhage and acute punctate cortical ischemic strokes in both hemispheres. PMH significant for  T2DM, HTN, HLD, anxiety/depression, GERD, obesity and multiple abdominopelvic surgeries.    PT Comments    Patient is progressing well with mobility and min guard with cues for transfer required. Pt with improved sequencing on log roll technique. This session focused on functional and therapeutic exercise for strengthening. Pt completed supine, seated, and standing exercises and provided handout. Pt remains mildly confused and unable to sequence counting for repetitions despite cues for corrections. Acute PT discussed rehab recommendations with pt and her niece to begin with HHPT and transition to OPPT and OT/SLP as well for slight cognitive impairments.     Recommendations for follow up therapy are one component of a multi-disciplinary discharge planning process, led by the attending physician.  Recommendations may be updated based on patient status, additional functional criteria and insurance authorization.  Follow Up Recommendations  Home health PT     Assistance Recommended at Discharge Intermittent Supervision/Assistance  Equipment Recommendations  None recommended by PT    Recommendations for Other Services       Precautions / Restrictions Precautions Precautions: Fall Precaution Comments: abdomen, wound vac Restrictions Weight Bearing Restrictions:  No     Mobility  Bed Mobility Overal bed mobility: Needs Assistance Bed Mobility: Rolling;Sidelying to Sit Rolling: Supervision Sidelying to sit: Min guard;HOB elevated       General bed mobility comments: verbal cue for log roll technique    Transfers Overall transfer level: Needs assistance Equipment used: Rolling walker (2 wheels) Transfers: Sit to/from Stand;Bed to chair/wheelchair/BSC Sit to Stand: Min guard     Step pivot transfers: Min guard     General transfer comment: overall min guard overall for rise from EOB and recliner. cues for hand placment.    Ambulation/Gait                   Stairs             Wheelchair Mobility    Modified Rankin (Stroke Patients Only)       Balance Overall balance assessment: Mild deficits observed, not formally tested                                          Cognition Arousal/Alertness: Awake/alert Behavior During Therapy: WFL for tasks assessed/performed Overall Cognitive Status: Impaired/Different from baseline Area of Impairment: Memory;Attention;Problem solving                   Current Attention Level: Sustained Memory: Decreased short-term memory       Problem Solving: Requires verbal cues;Difficulty sequencing General Comments: pt still mildly confused. unable to count in order and repeating numbers while counting repetitions for exercises.        Exercises General Exercises - Lower Extremity Long Arc Quad: AROM;Both;10 reps;Seated Straight Leg Raises: AROM;Both;10 reps;Supine Hip Flexion/Marching: AROM;Both;10 reps;Standing Heel Raises: AROM;Both;15 reps;Standing Other Exercises  Other Exercises: 5 reps sit<>stand without UE use initially and using UE's as fatigued    General Comments        Pertinent Vitals/Pain Pain Assessment: Faces Faces Pain Scale: Hurts little more Pain Location: abdomen Pain Descriptors / Indicators: Discomfort;Sore;Aching Pain  Intervention(s): Limited activity within patient's tolerance;Monitored during session;Repositioned    Home Living                          Prior Function            PT Goals (current goals can now be found in the care plan section) Acute Rehab PT Goals Patient Stated Goal: Less pain and go home PT Goal Formulation: With patient/family Time For Goal Achievement: 03/07/21 Potential to Achieve Goals: Good Progress towards PT goals: Progressing toward goals    Frequency    Min 3X/week      PT Plan Current plan remains appropriate    Co-evaluation              AM-PAC PT "6 Clicks" Mobility   Outcome Measure  Help needed turning from your back to your side while in a flat bed without using bedrails?: A Little Help needed moving from lying on your back to sitting on the side of a flat bed without using bedrails?: A Little Help needed moving to and from a bed to a chair (including a wheelchair)?: A Little Help needed standing up from a chair using your arms (e.g., wheelchair or bedside chair)?: A Little Help needed to walk in hospital room?: A Little Help needed climbing 3-5 steps with a railing? : A Lot 6 Click Score: 17    End of Session Equipment Utilized During Treatment: Gait belt Activity Tolerance: Patient tolerated treatment well Patient left: with call bell/phone within reach;in chair;with family/visitor present Nurse Communication: Mobility status PT Visit Diagnosis: Unsteadiness on feet (R26.81);Pain     Time: 6812-7517 PT Time Calculation (min) (ACUTE ONLY): 25 min  Charges:  $Therapeutic Exercise: 23-37 mins                     Wynn Maudlin, DPT Acute Rehabilitation Services Office [phone] Pager [phone]    Anitra Lauth 02/24/2021, 1:31 PM

## 2021-02-24 NOTE — Progress Notes (Signed)
Occupational Therapy Treatment Patient Details Name: Laurie [name] MRN: [id number] DOB: 05-27-1955 Today's Date: 02/24/2021   History of present illness Laurie [name] is a 65 y.o. female who presented to the ED 02/06/21 with abdominal pain, CT revealed an incarcerated ventral hernia. Pt procedures: 02/06/21 small bowell resection, hernia repair and lysis of adhesions, 11/4 wound vac placement, 11/5 drain placement for anterior peirtoneal abcess. On 11/14 Neuroimaging for encephalopathy showed R PCA subacute stroke with petechial hemorrhage and acute punctate cortical ischemic strokes in both hemispheres. PMH significant for  T2DM, HTN, HLD, anxiety/depression, GERD, obesity and multiple abdominopelvic surgeries.   OT comments  Today patient presents mildly confused with some difficulty with her short term memory - she's needing verbal cues for safely and to perform ADLs. Overall patient min guard for toileting and min assist for LB bathing. She was able to stand at sink and wash face though needed three verbal cues to perform the task. At times her response to therapist incorrect as if she heard therapist say something completely different. Overall min guard for bed mobility and in room ambulation. POC has always been for [facility] services as patient says she has help but therapist has not personally spoke to any family. Patient will need 24/7 supervision/assistance at discharge especially since she is now mildly confused. She also reports black spots/lines in her vision for the last couple of days - though no gross field cut loss and she is able to grossly read but with some errors. Will continue to follow acutely.   Recommendations for follow up therapy are one component of a multi-disciplinary discharge planning process, led by the attending physician.  Recommendations may be updated based on patient status, additional functional criteria and insurance authorization.    Follow Up  Recommendations  Home health OT    Assistance Recommended at Discharge Frequent or constant Supervision/Assistance  Equipment Recommendations       Recommendations for Other Services      Precautions / Restrictions Precautions Precautions: Fall Precaution Comments: abdomen, wound vac and JP drain on R Restrictions Weight Bearing Restrictions: No       Mobility Bed Mobility Overal bed mobility: Needs Assistance Bed Mobility: Rolling;Sidelying to Sit Rolling: Supervision Sidelying to sit: Min guard;HOB elevated       General bed mobility comments: verbal cue for log roll technique    Transfers Overall transfer level: Needs assistance Equipment used: Rolling walker (2 wheels) Transfers: Sit to/from Stand Sit to Stand: Min guard           General transfer comment: overall min guard for ambulation with walker but verbal cues for barrier negoatitaion     Balance Overall balance assessment: Mild deficits observed, not formally tested                                         ADL either performed or assessed with clinical judgement   ADL Overall ADL's : Needs assistance/impaired Eating/Feeding: NPO Eating/Feeding Details (indicate cue type and reason): for test Grooming: Standing;Wash/dry face;Min guard Grooming Details (indicate cue type and reason): min guard to stand at sink - verbal cues needed to perform task.     Lower Body Bathing: Min guard;Minimal assistance;Sitting/lateral leans;Sit to/from stand;Set up Lower Body Bathing Details (indicate cue type and reason): to wash periarea and buttocks. will need assistane to reach feet.  Toilet Transfer: Min guard;Rolling walker (2 wheels);Ambulation;Regular Toilet;Grab bars Toilet Transfer Details (indicate cue type and reason): verbal cues for hand placement and safety Toileting- Clothing Manipulation and Hygiene: Min guard;Sit to/from stand       Functional mobility during ADLs: Min  guard;Rolling walker (2 wheels)      Extremity/Trunk Assessment Upper Extremity Assessment Upper Extremity Assessment: Overall WFL for tasks assessed   Lower Extremity Assessment Lower Extremity Assessment: Defer to PT evaluation        Vision   Additional Comments: is returning "black splotech or lines" in her vision.   Perception     Praxis      Cognition Arousal/Alertness: Awake/alert Behavior During Therapy: WFL for tasks assessed/performed Overall Cognitive Status: Impaired/Different from baseline Area of Impairment: Memory;Attention                   Current Attention Level: Sustained Memory: Decreased short-term memory         General Comments: appears to be mildly confused. Could not retain that she needed to be NPO for TEE.          Exercises     Shoulder Instructions       General Comments      Pertinent Vitals/ Pain       Pain Assessment: Faces Faces Pain Scale: Hurts little more Pain Location: abdomen Pain Descriptors / Indicators: Discomfort;Sore;Aching Pain Intervention(s): Limited activity within patient's tolerance  Home Living                                          Prior Functioning/Environment              Frequency  Min 2X/week        Progress Toward Goals  OT Goals(current goals can now be found in the care plan section)  Progress towards OT goals: Progressing toward goals  Acute Rehab OT Goals Patient Stated Goal: to get stronger and go home OT Goal Formulation: With patient Time For Goal Achievement: 03/10/21 Potential to Achieve Goals: Good  Plan Discharge plan remains appropriate    Co-evaluation                 AM-PAC OT "6 Clicks" Daily Activity     Outcome Measure   Help from another person eating meals?: None (NPO today for test but typically is able to feed herself.) Help from another person taking care of personal grooming?: A Little Help from another person  toileting, which includes using toliet, bedpan, or urinal?: A Little Help from another person bathing (including washing, rinsing, drying)?: A Little Help from another person to put on and taking off regular upper body clothing?: A Little Help from another person to put on and taking off regular lower body clothing?: A Little 6 Click Score: 19    End of Session Equipment Utilized During Treatment: Rolling walker (2 wheels)  OT Visit Diagnosis: Unsteadiness on feet (R26.81);Other abnormalities of gait and mobility (R26.89)   Activity Tolerance Patient tolerated treatment well   Patient Left in chair;with call bell/phone within reach;with chair alarm set;with nursing/sitter in room   Nurse Communication Mobility status        Time: 0109-3235 OT Time Calculation (min): 40 min  Charges: OT General Charges $OT Visit: 1 Visit OT Treatments $Self Care/Home Management : 23-37 mins $Therapeutic Activity: 8-22 mins  Laurie [name], OTR/L Acute Care  Rehab Services  Office [phone] Pager: [phone]   Laurie [name] 02/24/2021, 10:35 AM

## 2021-02-24 NOTE — Progress Notes (Signed)
PROGRESS NOTE    Laurie [name]  [id number] DOB: 1955-12-10 DOA: 02/06/2021 PCP: Cindra Eves, MD    Brief NarrativeCV:[id number] w/ a hx of DM2, HTN, HLD, anxiety/depression, GERD, obesity, and multiple abdomino-pelvic surgeries who presented to the ED with 5 days of worsening lower abdominal pain associated with loose stools and vomiting. In the ED CT revealed an incarcerated ventral hernia. EDP attempted reduction though symptoms did not improve. Labs notable for acute renal failure in the setting of dehydration. She was transferred to [facility] ED for surgical consult, and repeat CT abdomen showed persistent hernia with bowel obstruction for which she was taken to the OR.  Assessment & Plan:   Active Problems:   CAD (coronary artery disease)   Essential hypertension   Acute renal failure (ARF) (HCC)   Diabetes mellitus without complication (HCC)   Incarcerated ventral hernia   SBO (small bowel obstruction) (HCC)   Obesity   Malnutrition of moderate degree  Incarcerated ventral hernia with SBO Anterior peirtoneal abscess Currently afebrile with resolved leukocytosis Status post repair with small bowel resection and LOA 10/31 Persisting abdominal discomfort and climbing WBC raised concern leading to CT abdomen pelvis 11/4 which noted questionable fluid collection within the abdomen which was drained by IR on 11/5 Continue IV Zosyn as per gen surgery, plan to cont abx through 11/19 for total 14 days Drain removed per Surgery -Cont wound vac MWF per General Surgery   Acute renal failure- improved Metabolic acidosis Acute urinary retention- resolved (pt self cath) Cr peaked to 1.34, showing improvement, currently 1.14 In and out prn (pt reports self cath about 3 times a day due to urinary diversion)   E.coli UTI Urine culture >100,000 E.coli, 40,000 klebsiella pneumoniae  Continued on IV zosyn per above Afebrile   Hypokalemia Replaced Daily CMP   Acute CVA MRI  showed acute ischemic nonhemorrhagic cortical infarct cortical infarct, evolving late subacute right PCA territory infarct involving the right occipital lobe, associated petechial blood products without frank intraparenchymal hematoma or significant mass-effect MRA head and neck no emergent findings Echo with EF 65 to [id number], grade 1 diastolic dysfunction, bubble study negative with no evidence of inter-atrial shunt LDL, 97, A1c--> 6.2 Start ASA, continue home crestor PT/OT Telemetry Neurology consulted- suspicious of embolic in nature, rec further work up TEE performed 11/18, pending results   Acute metabolic encephalopathy Improving Likely due to UTI vs CVA as above Afebrile, with resolved leukocytosis BC X 2 NGTD Abd xray with distended urinary conduit Recent CXR unremarkable Pt seems to be answering questions appropriately   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 Terance Hart at [facility] - history of cystectomy and right colon pouch for interstitial cystitis 1990s - pouch stone is found to be nonobstructing   DM2 Continue with SSI, accuchecks   HTN BP stable   COPD Stable Continue usual Dulera and as needed albuterol   GERD PPI   Obesity - Body mass index is 34.33 kg/m. Recommend continued diet and lifestyle modification   DVT prophylaxis: Lovenox subq Code Status: Full Family Communication: Pt in room, family not at bedside  Status is: Inpatient  Remains inpatient appropriate because: Severity of illness   Consultants:  General Surgery  IR  Procedures:  10/31 ventral hernia repair with small bowel resection and LOA 11/5 CT-guided drainage of anterior peritoneal abscess in IR -10 French drain left in place none  Antimicrobials: Anti-infectives (From admission, onward)    Start  Dose/Rate Route Frequency Ordered Stop   02/12/21 1200  [MAR Hold]  piperacillin-tazobactam (ZOSYN) IVPB 3.375 g        (MAR Hold since Fri 02/24/2021  at 1242.Hold Reason: Transfer to a Procedural area)   3.375 g 12.5 mL/hr over 240 Minutes Intravenous Every 8 hours 02/12/21 0950     02/06/21 2003  ceFAZolin (ANCEF) 2-4 GM/100ML-% IVPB       Note to Pharmacy: Cleda Daub   : cabinet override      02/06/21 2003 02/06/21 2046   02/06/21 1945  ceFAZolin (ANCEF) IVPB 2g/100 mL premix        2 g 200 mL/hr over 30 Minutes Intravenous On call to O.R. 02/06/21 1914 02/06/21 2028       Subjective: Without complaints this AM  Objective: Vitals:   02/24/21 0922 02/24/21 1249 02/24/21 1344 02/24/21 1354  BP:  (!) 164/61 133/60 (!) 132/59  Pulse:  65 76 61  Resp:  20 17 16   Temp:  (!) 97.5 F (36.4 C) (!) 96.3 F (35.7 C)   TempSrc:  Temporal Temporal   SpO2: 96% 100% 100% 98%  Weight:  90.7 kg    Height:  5\' 4"  (1.626 m)      Intake/Output Summary (Last 24 hours) at 02/24/2021 1401 Last data filed at 02/24/2021 1334 Gross per 24 hour  Intake 177.76 ml  Output 1200 ml  Net -1022.24 ml    Filed Weights   02/06/21 1053 02/24/21 1249  Weight: 90.7 kg 90.7 kg    Examination: General exam: Awake, laying in bed, in nad Respiratory system: Normal respiratory effort, no wheezing Cardiovascular system: regular rate, s1, s2 Gastrointestinal system: Soft, nondistended, positive BS Central nervous system: CN2-12 grossly intact, strength intact Extremities: Perfused, no clubbing Skin: Normal skin turgor, no notable skin lesions seen Psychiatry: Mood normal // no visual hallucinations   Data Reviewed: I have personally reviewed following labs and imaging studies  CBC: Recent Labs  Lab 02/20/21 0509 02/21/21 0502 02/22/21 0517 02/23/21 0446 02/24/21 0438  WBC 7.6 8.0 9.1 9.4 7.4  NEUTROABS 4.9 5.6 6.2 6.2 5.1  HGB 10.4* 10.9* 11.5* 12.0 11.8*  HCT 31.8* 33.7* 35.1* 36.6 36.2  MCV 98.5 99.7 98.0 98.7 99.2  PLT 581* 579* 627* 551* 636*    Basic Metabolic Panel: Recent Labs  Lab 02/18/21 1136 02/19/21 0144  02/20/21 0509 02/21/21 0502 02/22/21 0517 02/23/21 0446 02/24/21 0438  NA 138   < > 143 143 142 140 143  K 3.5   < > 3.6 3.1* 3.2* 3.6 3.9  CL 112*   < > 116* 117* 113* 115* 115*  CO2 15*   < > 17* 16* 19* 15* 18*  GLUCOSE 157*   < > 106* 135* 139* 133* 163*  BUN 19   < > 16 12 10 12 15   CREATININE 1.34*   < > 1.09* 0.91 1.03* 1.11* 1.14*  CALCIUM 9.4   < > 9.1 9.1 9.1 9.2 9.7  MG 1.9  --   --  1.8  --   --   --    < > = values in this interval not displayed.    GFR: Estimated Creatinine Clearance: 53 mL/min (A) (by C-G formula based on SCr of 1.14 mg/dL (H)). Liver Function Tests: Recent Labs  Lab 02/20/21 0509 02/21/21 0502 02/22/21 0517 02/23/21 0446 02/24/21 0438  AST 73* 57* 45* 37 31  ALT 72* 67* 63* 56* 48*  ALKPHOS 74 78 82 76 78  BILITOT 0.5 0.6 0.6 0.4 0.3  PROT 7.0 7.2 7.5 7.2 7.3  ALBUMIN 2.7* 2.9* 3.1* 3.0* 3.0*    No results for input(s): LIPASE, AMYLASE in the last 168 hours. No results for input(s): AMMONIA in the last 168 hours. Coagulation Profile: No results for input(s): INR, PROTIME in the last 168 hours. Cardiac Enzymes: No results for input(s): CKTOTAL, CKMB, CKMBINDEX, TROPONINI in the last 168 hours. BNP (last 3 results) No results for input(s): PROBNP in the last 8760 hours. HbA1C: No results for input(s): HGBA1C in the last 72 hours. CBG: Recent Labs  Lab 02/23/21 1326 02/23/21 1639 02/23/21 2054 02/24/21 0815 02/24/21 1145  GLUCAP 212* 130* 185* 140* 147*    Lipid Profile: No results for input(s): CHOL, HDL, LDLCALC, TRIG, CHOLHDL, LDLDIRECT in the last 72 hours. Thyroid Function Tests: No results for input(s): TSH, T4TOTAL, FREET4, T3FREE, THYROIDAB in the last 72 hours. Anemia Panel: No results for input(s): VITAMINB12, FOLATE, FERRITIN, TIBC, IRON, RETICCTPCT in the last 72 hours. Sepsis Labs: No results for input(s): PROCALCITON, LATICACIDVEN in the last 168 hours.  Recent Results (from the past 240 hour(s))  Culture,  blood (routine x 2)     Status: None   Collection Time: 02/18/21 12:04 PM   Specimen: BLOOD  Result Value Ref Range Status   Specimen Description   Final    BLOOD RIGHT ANTECUBITAL Performed at [facility], [address] [place], [place] [address]    Special Requests   Final    BOTTLES DRAWN AEROBIC ONLY Blood Culture adequate volume Performed at [facility], [address] [place], [place] [address]    Culture   Final    NO GROWTH 5 DAYS Performed at [facility], [address] [place], [place] [address]    Report Status 02/23/2021 FINAL  Final  Culture, blood (routine x 2)     Status: None   Collection Time: 02/18/21 12:04 PM   Specimen: BLOOD LEFT HAND  Result Value Ref Range Status   Specimen Description   Final    BLOOD LEFT HAND Performed at [facility], [address] [place], [place] [address]    Special Requests   Final    BOTTLES DRAWN AEROBIC ONLY Blood Culture results may not be optimal due to an inadequate volume of blood received in culture bottles Performed at [facility], [address] [place], [place] [address]    Culture   Final    NO GROWTH 5 DAYS Performed at [facility], [address] [place], [place] [address]    Report Status 02/23/2021 FINAL  Final  Urine Culture     Status: Abnormal   Collection Time: 02/18/21 12:48 PM   Specimen: In/Out Cath Urine  Result Value Ref Range Status   Specimen Description   Final    IN/OUT CATH URINE Performed at [facility], [address] [place], [place] [address]    Special Requests   Final    NONE Performed at [facility], [address] [place], [place] [address]    Culture (A)  Final    40,000 COLONIES/mL KLEBSIELLA PNEUMONIAE >=100,000 COLONIES/mL ESCHERICHIA COLI    Report Status 02/21/2021 FINAL  Final   Organism ID, Bacteria KLEBSIELLA PNEUMONIAE (A)  Final    Organism ID, Bacteria ESCHERICHIA COLI (A)  Final      Susceptibility   Escherichia coli - MIC*    AMPICILLIN  8 SENSITIVE Sensitive     CEFAZOLIN <=4 SENSITIVE Sensitive     CEFEPIME <=0.12 SENSITIVE Sensitive     CEFTRIAXONE <=0.25 SENSITIVE Sensitive     CIPROFLOXACIN <=0.25 SENSITIVE Sensitive     GENTAMICIN <=1 SENSITIVE Sensitive     IMIPENEM <=0.25 SENSITIVE Sensitive     NITROFURANTOIN <=16 SENSITIVE Sensitive     TRIMETH/SULFA <=20 SENSITIVE Sensitive     AMPICILLIN/SULBACTAM <=2 SENSITIVE Sensitive     PIP/TAZO <=4 SENSITIVE Sensitive     * >=100,000 COLONIES/mL ESCHERICHIA COLI   Klebsiella pneumoniae - MIC*    AMPICILLIN >=32 RESISTANT Resistant     CEFAZOLIN <=4 SENSITIVE Sensitive     CEFEPIME <=0.12 SENSITIVE Sensitive     CEFTRIAXONE <=0.25 SENSITIVE Sensitive     CIPROFLOXACIN <=0.25 SENSITIVE Sensitive     GENTAMICIN <=1 SENSITIVE Sensitive     IMIPENEM <=0.25 SENSITIVE Sensitive     NITROFURANTOIN 64 INTERMEDIATE Intermediate     TRIMETH/SULFA <=20 SENSITIVE Sensitive     AMPICILLIN/SULBACTAM 8 SENSITIVE Sensitive     PIP/TAZO <=4 SENSITIVE Sensitive     * 40,000 COLONIES/mL KLEBSIELLA PNEUMONIAE      Radiology Studies: IR Sinus/Fist Tube Chk-Non GI  Result Date: 02/23/2021 CLINICAL DATA:  65 year old female with history of recent abdominal wall hernia repair complicated postoperatively by anterior abdominal wall fluid collection status post percutaneous drain placement on 02/11/2021. There has been trace output from the drain over the past several days and recent repeat CT demonstrates no significant residual fluid collection. EXAM: SINUS TRACT INJECTION/FISTULOGRAM COMPARISON:  02/16/2021, 02/11/2021, 02/10/2021 CONTRAST:  5 mL-administered via the existing percutaneous drain. FLUOROSCOPY TIME:  14 seconds, 4 mGy TECHNIQUE: The patient was positioned supine on the fluoroscopy table. A preprocedural spot fluoroscopic image was obtained of the right mid abdomen  and the existing percutaneous drainage catheter. Multiple spot fluoroscopic and radiographic images were obtained following the injection of a small amount of contrast via the existing percutaneous drainage catheter. The external portion of the drain was cut to release the pigtail and the drain was removed without complication. Sterile bandage was applied. FINDINGS: Trace residual fluid collection without evidence of enteric fistula. IMPRESSION: Trace residual fluid collection without evidence of enteric fistula. The drain was removed successfully. Ruthann Cancer, MD [facility] Electronically Signed   By: Ruthann Cancer M.D.   On: 02/23/2021 11:18    Scheduled Meds:  [MAR Hold] (feeding supplement) PROSource Plus  30 mL Oral BID BM   [MAR Hold] aspirin EC  81 mg Oral Daily   [MAR Hold] enoxaparin (LOVENOX) injection  40 mg Subcutaneous Q24H   [MAR Hold] feeding supplement  237 mL Oral BID BM   [MAR Hold] insulin aspart  0-5 Units Subcutaneous QHS   [MAR Hold] insulin aspart  0-9 Units Subcutaneous TID WC   [MAR Hold] melatonin  3 mg Oral QHS   [MAR Hold] methocarbamol  500 mg Oral Q6H   [MAR Hold] metoprolol succinate  50 mg Oral Daily   [MAR Hold] mometasone-formoterol  2 puff Inhalation BID   [MAR Hold] multivitamin with minerals  1 tablet Oral Daily   [MAR Hold] pantoprazole  40 mg Oral Daily   [MAR Hold] polycarbophil  625 mg Oral BID   [MAR Hold] polyethylene glycol  17 g Oral Daily   [MAR Hold] QUEtiapine  12.5 mg Oral QHS   [MAR Hold] rosuvastatin  10 mg Oral Daily   [MAR Hold] saccharomyces boulardii  250 mg Oral  BID   [MAR Hold] sodium chloride flush  3 mL Intravenous Q12H   [MAR Hold] sodium chloride flush  5 mL Intracatheter Daily   Continuous Infusions:  sodium chloride 20 mL/hr at 02/24/21 1301   [MAR Hold] piperacillin-tazobactam (ZOSYN)  IV Stopped (02/24/21 1151)     LOS: 17 days   Rickey Barbara, MD [facility] Pager On Amion  If 7PM-7AM, please contact night-coverage 02/24/2021, 2:01 PM

## 2021-02-24 NOTE — Transfer of Care (Signed)
Immediate Anesthesia Transfer of Care Note  Patient: Laurie [name]  Procedure(s) Performed: TRANSESOPHAGEAL ECHOCARDIOGRAM (TEE) BUBBLE STUDY  Patient Location: PACU and Endoscopy Unit  Anesthesia Type:MAC  Level of Consciousness: awake, oriented and patient cooperative  Airway & Oxygen Therapy: Patient Spontanous Breathing and Patient connected to nasal cannula oxygen  Post-op Assessment: Report given to RN and Post -op Vital signs reviewed and stable  Post vital signs: Reviewed and stable  Last Vitals: see PACU flowsheet for data Vitals Value Taken Time  BP    Temp    Pulse    Resp    SpO2      Last Pain:  Vitals:   02/24/21 1249  TempSrc: Temporal  PainSc: 10-Worst pain ever      Patients Stated Pain Goal: 0 (64/68/03 2122)  Complications: No notable events documented.

## 2021-02-24 NOTE — Anesthesia Procedure Notes (Signed)
Procedure Name: MAC Date/Time: 02/24/2021 1:20 PM Performed by: Lowella Dell, CRNA Pre-anesthesia Checklist: Patient identified, Emergency Drugs available, Suction available, Patient being monitored and Timeout performed Patient Re-evaluated:Patient Re-evaluated prior to induction Oxygen Delivery Method: Nasal cannula Placement Confirmation: positive ETCO2 Dental Injury: Teeth and Oropharynx as per pre-operative assessment

## 2021-02-24 NOTE — Anesthesia Preprocedure Evaluation (Addendum)
Anesthesia Evaluation  Patient identified by MRN, date of birth, ID band Patient awake    Reviewed: Allergy & Precautions, NPO status , Patient's Chart, lab work & pertinent test results, reviewed documented beta blocker date and time   Airway Mallampati: III  TM Distance: >3 FB Neck ROM: Full    Dental  (+) Edentulous Upper, Edentulous Lower   Pulmonary asthma , former smoker,    Pulmonary exam normal breath sounds clear to auscultation       Cardiovascular hypertension, Pt. on medications and Pt. on home beta blockers + CAD  Normal cardiovascular exam Rhythm:Regular Rate:Normal     Neuro/Psych negative neurological ROS  negative psych ROS   GI/Hepatic GERD  Medicated and Controlled,(+) Hepatitis -, C  Endo/Other  diabetes, Well Controlled, Type 2, Oral Hypoglycemic Agents, Insulin DependentObesity BMI 34 a1c 6.2  Renal/GU ARFRenal diseaseCr 4.19  negative genitourinary   Musculoskeletal negative musculoskeletal ROS (+)   Abdominal (+) + obese,   Peds  Hematology  (+) Blood dyscrasia, anemia ,   Anesthesia Other Findings Chronic LBP, abdominal pain  Reproductive/Obstetrics negative OB ROS                             Anesthesia Physical  Anesthesia Plan  ASA: 4  Anesthesia Plan: MAC   Post-op Pain Management: Minimal or no pain anticipated   Induction: Intravenous  PONV Risk Score and Plan: 4 or greater and Treatment may vary due to age or medical condition and Propofol infusion  Airway Management Planned: Nasal Cannula, Natural Airway and Simple Face Mask  Additional Equipment: None  Intra-op Plan:   Post-operative Plan:   Informed Consent: I have reviewed the patients History and Physical, chart, labs and discussed the procedure including the risks, benefits and alternatives for the proposed anesthesia with the patient or authorized representative who has indicated his/her  understanding and acceptance.     Dental advisory given  Plan Discussed with: CRNA and Anesthesiologist  Anesthesia Plan Comments: (Allergies to all pain meds- per pt does ok with pain meds if she has benadryl simultaneously )       Anesthesia Quick Evaluation

## 2021-02-24 NOTE — Anesthesia Postprocedure Evaluation (Signed)
Anesthesia Post Note  Patient: Laurie [name]  Procedure(s) Performed: TRANSESOPHAGEAL ECHOCARDIOGRAM (TEE) BUBBLE STUDY     Patient location during evaluation: PACU Anesthesia Type: MAC Level of consciousness: awake and alert Pain management: pain level controlled Vital Signs Assessment: post-procedure vital signs reviewed and stable Respiratory status: spontaneous breathing, nonlabored ventilation, respiratory function stable and patient connected to nasal cannula oxygen Cardiovascular status: stable and blood pressure returned to baseline Postop Assessment: no apparent nausea or vomiting Anesthetic complications: no   No notable events documented.  Last Vitals:  Vitals:   02/24/21 1344 02/24/21 1354  BP: 133/60 (!) 132/59  Pulse: 76 61  Resp: 17 16  Temp: (!) 35.7 C   SpO2: 100% 98%    Last Pain:  Vitals:   02/24/21 1344  TempSrc: Temporal  PainSc: 10-Worst pain ever                 Scotty Weigelt

## 2021-02-25 LAB — COMPREHENSIVE METABOLIC PANEL
ALT: 43 U/L (ref 0–44)
AST: 32 U/L (ref 15–41)
Albumin: 3.2 g/dL — ABNORMAL LOW (ref 3.5–5.0)
Alkaline Phosphatase: 80 U/L (ref 38–126)
Anion gap: 9 (ref 5–15)
BUN: 17 mg/dL (ref 8–23)
CO2: 16 mmol/L — ABNORMAL LOW (ref 22–32)
Calcium: 9.2 mg/dL (ref 8.9–10.3)
Chloride: 116 mmol/L — ABNORMAL HIGH (ref 98–111)
Creatinine, Ser: 1.17 mg/dL — ABNORMAL HIGH (ref 0.44–1.00)
GFR, Estimated: 51 mL/min — ABNORMAL LOW (ref >60)
Glucose, Bld: 159 mg/dL — ABNORMAL HIGH (ref 70–99)
Potassium: 3.3 mmol/L — ABNORMAL LOW (ref 3.5–5.1)
Sodium: 141 mmol/L (ref 135–145)
Total Bilirubin: 0.7 mg/dL (ref 0.3–1.2)
Total Protein: 7.2 g/dL (ref 6.5–8.1)

## 2021-02-25 LAB — CBC WITH DIFFERENTIAL/PLATELET
Abs Immature Granulocytes: 0.07 10*3/uL (ref 0.00–0.07)
Basophils Absolute: 0 10*3/uL (ref 0.0–0.1)
Basophils Relative: 1 %
Eosinophils Absolute: 0.3 10*3/uL (ref 0.0–0.5)
Eosinophils Relative: 4 %
HCT: 37 % (ref 36.0–46.0)
Hemoglobin: 11.9 g/dL — ABNORMAL LOW (ref 12.0–15.0)
Immature Granulocytes: 1 %
Lymphocytes Relative: 23 %
Lymphs Abs: 1.7 10*3/uL (ref 0.7–4.0)
MCH: 31.6 pg (ref 26.0–34.0)
MCHC: 32.2 g/dL (ref 30.0–36.0)
MCV: 98.1 fL (ref 80.0–100.0)
Monocytes Absolute: 0.9 10*3/uL (ref 0.1–1.0)
Monocytes Relative: 12 %
Neutro Abs: 4.4 10*3/uL (ref 1.7–7.7)
Neutrophils Relative %: 59 %
Platelets: 598 10*3/uL — ABNORMAL HIGH (ref 150–400)
RBC: 3.77 MIL/uL — ABNORMAL LOW (ref 3.87–5.11)
RDW: 15.7 % — ABNORMAL HIGH (ref 11.5–15.5)
WBC: 7.3 10*3/uL (ref 4.0–10.5)
nRBC: 0 % (ref 0.0–0.2)

## 2021-02-25 LAB — GLUCOSE, CAPILLARY
Glucose-Capillary: 152 mg/dL — ABNORMAL HIGH (ref 70–99)
Glucose-Capillary: 144 mg/dL — ABNORMAL HIGH (ref 70–99)
Glucose-Capillary: 132 mg/dL — ABNORMAL HIGH (ref 70–99)
Glucose-Capillary: 168 mg/dL — ABNORMAL HIGH (ref 70–99)

## 2021-02-25 MED ORDER — POTASSIUM CHLORIDE CRYS ER 20 MEQ PO TBCR
60.0000 meq | EXTENDED_RELEASE_TABLET | Freq: Once | ORAL | Status: AC
  Administered 2021-02-25: 60 meq via ORAL
  Filled 2021-02-25: qty 3

## 2021-02-25 NOTE — Progress Notes (Signed)
1 Day Post-Op   Subjective/Chief Complaint: Doing well with some and soreness Tol Po  Objective: Vital signs in last 24 hours: Temp:  [96.3 F (35.7 C)-98.5 F (36.9 C)] 98.5 F (36.9 C) (11/19 0550) Pulse Rate:  [58-77] 70 (11/19 0550) Resp:  [12-20] 16 (11/19 0550) BP: (130-164)/(59-82) 130/73 (11/19 0550) SpO2:  [96 %-100 %] 96 % (11/19 0811) Weight:  [90.7 kg] 90.7 kg (11/18 1249) Last BM Date: 02/22/21  Intake/Output from previous day: 11/18 0701 - 11/19 0700 In: 550 [P.O.:236; I.V.:153; IV Piggyback:161] Out: -  Intake/Output this shift: No intake/output data recorded.  PE: Gen:  Alert, NAD, pleasant Pulm: Rate and effort normal Abd: Vac to midline with good seal.  Abd soft, nondistended, with right sided tenderness without rigidity or guarding. Drain site with dressing in place, c/d/i  Lab Results:  Recent Labs    02/24/21 0438 02/25/21 0530  WBC 7.4 7.3  HGB 11.8* 11.9*  HCT 36.2 37.0  PLT 636* 598*   BMET Recent Labs    02/24/21 0438 02/25/21 0530  NA 143 141  K 3.9 3.3*  CL 115* 116*  CO2 18* 16*  GLUCOSE 163* 159*  BUN 15 17  CREATININE 1.14* 1.17*  CALCIUM 9.7 9.2   PT/INR No results for input(s): LABPROT, INR in the last 72 hours. ABG No results for input(s): PHART, HCO3 in the last 72 hours.  Invalid input(s): PCO2, PO2  Studies/Results: IR Sinus/Fist Tube Chk-Non GI  Result Date: 02/23/2021 CLINICAL DATA:  65 year old female with history of recent abdominal wall hernia repair complicated postoperatively by anterior abdominal wall fluid collection status post percutaneous drain placement on 02/11/2021. There has been trace output from the drain over the past several days and recent repeat CT demonstrates no significant residual fluid collection. EXAM: SINUS TRACT INJECTION/FISTULOGRAM COMPARISON:  02/16/2021, 02/11/2021, 02/10/2021 CONTRAST:  5 mL-administered via the existing percutaneous drain. FLUOROSCOPY TIME:  14 seconds, 4 mGy  TECHNIQUE: The patient was positioned supine on the fluoroscopy table. A preprocedural spot fluoroscopic image was obtained of the right mid abdomen and the existing percutaneous drainage catheter. Multiple spot fluoroscopic and radiographic images were obtained following the injection of a small amount of contrast via the existing percutaneous drainage catheter. The external portion of the drain was cut to release the pigtail and the drain was removed without complication. Sterile bandage was applied. FINDINGS: Trace residual fluid collection without evidence of enteric fistula. IMPRESSION: Trace residual fluid collection without evidence of enteric fistula. The drain was removed successfully. Marliss Coots, MD [facility] Electronically Signed   By: Marliss Coots M.D.   On: 02/23/2021 11:18   ECHO TEE  Result Date: 02/24/2021    TRANSESOPHOGEAL ECHO REPORT   Patient Name:   Laurie [name] Alverson Date of Exam: 02/24/2021 Medical Rec #:  [id number]           Height:       64.0 in Accession #:    [id number]          Weight:       200.0 lb Date of Birth:  Sep 02, 1955           BSA:          1.956 m Patient Age:    65 years            BP:           133/60 mmHg Patient Gender: F  HR:           82 bpm. Exam Location:  Inpatient Procedure: Transesophageal Echo, Color Doppler and Saline Contrast Bubble Study Indications:     Stroke  History:         Patient has prior history of Echocardiogram examinations, most                  recent 02/19/2021. CAD; Risk Factors:Hypertension, Diabetes and                  Dyslipidemia.  Sonographer:     Darlina Sicilian RDCS Referring Phys:  [id number] RHONDA G BARRETT Diagnosing Phys: Kirk Ruths MD PROCEDURE: After discussion of the risks and benefits of a TEE, an informed consent was obtained from the patient. TEE procedure time was 5 minutes. The transesophogeal probe was passed without difficulty through the  esophogus of the patient. Imaged were  obtained with the patient in a left lateral decubitus position. Local oropharyngeal anesthetic was provided with Cetacaine. Sedation performed by different physician. The patient was monitored while under deep sedation. Anesthestetic sedation was provided intravenously by Anesthesiology: 192.97mg  of Propofol, 50mg  of Lidocaine. Image quality was good. The patient's vital signs; including heart rate, blood pressure, and oxygen saturation; remained stable throughout the procedure. The patient developed no complications during the procedure. IMPRESSIONS  1. Left ventricular ejection fraction, by estimation, is 55 to 60%. The left ventricle has normal function. The left ventricle has no regional wall motion abnormalities.  2. Right ventricular systolic function is normal. The right ventricular size is normal.  3. No left atrial/left atrial appendage thrombus was detected.  4. The mitral valve is normal in structure. Trivial mitral valve regurgitation.  5. The aortic valve is tricuspid. Aortic valve regurgitation is not visualized.  6. There is mild (Grade II) plaque involving the descending aorta.  7. Agitated saline contrast bubble study was negative, with no evidence of any interatrial shunt. FINDINGS  Left Ventricle: Left ventricular ejection fraction, by estimation, is 55 to 60%. The left ventricle has normal function. The left ventricle has no regional wall motion abnormalities. The left ventricular internal cavity size was normal in size. Right Ventricle: The right ventricular size is normal. Right ventricular systolic function is normal. Left Atrium: Left atrial size was normal in size. No left atrial/left atrial appendage thrombus was detected. Right Atrium: Right atrial size was normal in size. Pericardium: There is no evidence of pericardial effusion. Mitral Valve: The mitral valve is normal in structure. Trivial mitral valve regurgitation. Tricuspid Valve: The tricuspid  valve is normal in structure. Tricuspid valve regurgitation is not demonstrated. Aortic Valve: The aortic valve is tricuspid. Aortic valve regurgitation is not visualized. Pulmonic Valve: The pulmonic valve was normal in structure. Pulmonic valve regurgitation is trivial. Aorta: The aortic root is normal in size and structure. There is mild (Grade II) plaque involving the descending aorta. IAS/Shunts: No atrial level shunt detected by color flow Doppler. Agitated saline contrast was given intravenously to evaluate for intracardiac shunting. Agitated saline contrast bubble study was negative, with no evidence of any interatrial shunt.   AORTA Ao Root diam: 2.90 cm Ao Asc diam:  2.70 cm Kirk Ruths MD Electronically signed by Kirk Ruths MD Signature Date/Time: 02/24/2021/2:44:01 PM    Final     Anti-infectives: Anti-infectives (From admission, onward)    Start     Dose/Rate Route Frequency Ordered Stop   02/12/21 1200  piperacillin-tazobactam (ZOSYN) IVPB 3.375 g  3.375 g 12.5 mL/hr over 240 Minutes Intravenous Every 8 hours 02/12/21 0950     02/06/21 2003  ceFAZolin (ANCEF) 2-4 GM/100ML-% IVPB       Note to Pharmacy: Cleda Daub   : cabinet override      02/06/21 2003 02/06/21 2046   02/06/21 1945  ceFAZolin (ANCEF) IVPB 2g/100 mL premix        2 g 200 mL/hr over 30 Minutes Intravenous On call to O.R. 02/06/21 1914 02/06/21 2028       Assessment/Plan: Ventral hernia with SBO POD#19 S/P ventral hernia repair with small bowel resection, LOA 10/31 Dr. Kieth Brightly - s/p IR drain 11/5, culture with somewhat resistant E coli but sensitive to zosyn - continue for now. She will complete 14days of abx 11/19  - CT 11/10 showed resolution of drained fluid collection with drain in place and a few dots of residual air, no new concerning findings. No definite enterocutaneous fistula but is high risk. - Drain study yesterday without enteric fistula. Drain removed. - Wound vac MWF - I have asked  bedside RN to let me know when she plans to change this so I can look at it with her - PT/OT, encourage mobilization - still recommending HH PT/OT - Encourage PO intake and continue bowel regimen, dietician following - Some increased pain overnight. Repeat labs in AM and monitor.   FEN: HH diet VTE: lovenox, ASA ID: Ancef pre-op, zosyn 11/6 - 11/19. WBC wnl, afebrile.   Per [facility] - Neurology following and working up Hx of cystectomy with urostomy in place - large stone noted in ileal conduit on CT, OP follow up with [facility] recommended  CKD T2DM with gastroparesis  HTN Chronic candidal esophagitis  GERD Hx of Hep C Glaucoma  B12 deficiency and iron deficiency anemia  CAD Hx of asthma   LOS: 18 days    Ralene Ok 02/25/2021

## 2021-02-25 NOTE — Progress Notes (Signed)
TEE reviewed, no embolic source. I would recommend prolonged outpatient cardiac monitoring. Could sconsider outpatient  loop recorder once discharged.   No further recommendations from neurology at this time, please call with further questions or concerns.   Ritta Slot, MD [facility] [phone]  If 7pm- 7am, please page neurology on call as listed in AMION.

## 2021-02-25 NOTE — Progress Notes (Signed)
PROGRESS NOTE    Laurie [name]  [id number] DOB: [birth year] DOA: 02/06/2021 PCP: Cindra Eves, MD    Brief NarrativePH:[id number] w/ a hx of DM2, HTN, HLD, anxiety/depression, GERD, obesity, and multiple abdomino-pelvic surgeries who presented to the ED with 5 days of worsening lower abdominal pain associated with loose stools and vomiting. In the ED CT revealed an incarcerated ventral hernia. EDP attempted reduction though symptoms did not improve. Labs notable for acute renal failure in the setting of dehydration. She was transferred to [facility] ED for surgical consult, and repeat CT abdomen showed persistent hernia with bowel obstruction for which she was taken to the OR.  Assessment & Plan:   Active Problems:   CAD (coronary artery disease)   Essential hypertension   Acute renal failure (ARF) (HCC)   Diabetes mellitus without complication (HCC)   Incarcerated ventral hernia   SBO (small bowel obstruction) (HCC)   Obesity   Malnutrition of moderate degree  Incarcerated ventral hernia with SBO Anterior peirtoneal abscess Currently afebrile with resolved leukocytosis Status post repair with small bowel resection and LOA 10/31 Persisting abdominal discomfort and climbing WBC raised concern leading to CT abdomen pelvis 11/4 which noted questionable fluid collection within the abdomen which was drained by IR on 11/5 Plan to complete IV Zosyn today or total 14 days Drain removed per Surgery -Cont wound vac MWF per General Surgery   Acute renal failure- improved Metabolic acidosis Acute urinary retention- resolved (pt self cath) Cr peaked to 1.34, showing improvement, currently 1.17 In and out prn (pt reports self cath about 3 times a day due to urinary diversion)   E.coli UTI Urine culture >100,000 E.coli, 40,000 klebsiella pneumoniae  Continued on IV zosyn per above Afebrile   Hypokalemia Remains low Will replace Repeat bmet in AM   Acute CVA 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 Recommend continued diet and lifestyle modification   DVT prophylaxis: Lovenox subq Code Status: Full Family Communication: Pt in room, family not at bedside  Status is: Inpatient  Remains inpatient appropriate because: Severity of illness   Consultants:  General Surgery  IR  Procedures:  10/31 ventral hernia repair with small bowel resection and LOA 11/5 CT-guided drainage of anterior peritoneal abscess in IR -10 French drain left in place none 02/24/21 TEE  Antimicrobials: Anti-infectives (From admission, onward)  Start     Dose/Rate Route Frequency Ordered Stop   02/12/21 1200  piperacillin-tazobactam (ZOSYN) IVPB 3.375  g        3.375 g 12.5 mL/hr over 240 Minutes Intravenous Every 8 hours 02/12/21 0950     02/06/21 2003  ceFAZolin (ANCEF) 2-4 GM/100ML-% IVPB       Note to Pharmacy: Ponciano Ort   : cabinet override      02/06/21 2003 02/06/21 2046   02/06/21 1945  ceFAZolin (ANCEF) IVPB 2g/100 mL premix        2 g 200 mL/hr over 30 Minutes Intravenous On call to O.R. 02/06/21 1914 02/06/21 2028       Subjective:  No complaints this AM  Objective: Vitals:   02/24/21 2133 02/25/21 0550 02/25/21 0811 02/25/21 1313  BP: 133/82 130/73  125/87  Pulse: 77 70  66  Resp: 16 16  20   Temp: (!) 97.5 F (36.4 C) 98.5 F (36.9 C)  97.6 F (36.4 C)  TempSrc: Oral Oral    SpO2: 100% 100% 96% 97%  Weight:      Height:        Intake/Output Summary (Last 24 hours) at 02/25/2021 1355 Last data filed at 02/25/2021 0400 Gross per 24 hour  Intake 400.02 ml  Output --  Net 400.02 ml    Filed Weights   02/06/21 1053 02/24/21 1249  Weight: 90.7 kg 90.7 kg    Examination: General exam: Conversant, in no acute distress Respiratory system: normal chest rise, clear, no audible wheezing Cardiovascular system: regular rhythm, s1-s2 Gastrointestinal system: Nondistended, nontender, pos BS Central nervous system: No seizures, no tremors Extremities: No cyanosis, no joint deformities Skin: No rashes, no pallor Psychiatry: Affect normal // no auditory hallucinations   Data Reviewed: I have personally reviewed following labs and imaging studies  CBC: Recent Labs  Lab 02/21/21 0502 02/22/21 0517 02/23/21 0446 02/24/21 0438 02/25/21 0530  WBC 8.0 9.1 9.4 7.4 7.3  NEUTROABS 5.6 6.2 6.2 5.1 4.4  HGB 10.9* 11.5* 12.0 11.8* 11.9*  HCT 33.7* 35.1* 36.6 36.2 37.0  MCV 99.7 98.0 98.7 99.2 98.1  PLT 579* 627* 551* 636* 598*    Basic Metabolic Panel: Recent Labs  Lab 02/21/21 0502 02/22/21 0517 02/23/21 0446 02/24/21 0438 02/25/21 0530  NA 143 142 140 143 141  K 3.1* 3.2* 3.6 3.9 3.3*  CL 117*  113* 115* 115* 116*  CO2 16* 19* 15* 18* 16*  GLUCOSE 135* 139* 133* 163* 159*  BUN 12 10 12 15 17   CREATININE 0.91 1.03* 1.11* 1.14* 1.17*  CALCIUM 9.1 9.1 9.2 9.7 9.2  MG 1.8  --   --   --   --     GFR: Estimated Creatinine Clearance: 51.6 mL/min (A) (by C-G formula based on SCr of 1.17 mg/dL (H)). Liver Function Tests: Recent Labs  Lab 02/21/21 0502 02/22/21 0517 02/23/21 0446 02/24/21 0438 02/25/21 0530  AST 57* 45* 37 31 32  ALT 67* 63* 56* 48* 43  ALKPHOS 78 82 76 78 80  BILITOT 0.6 0.6 0.4 0.3 0.7  PROT 7.2 7.5 7.2 7.3 7.2  ALBUMIN 2.9* 3.1* 3.0* 3.0* 3.2*    No results for input(s): LIPASE, AMYLASE in the last 168 hours. No results for input(s): AMMONIA in the last 168 hours. Coagulation Profile: No results for input(s): INR, PROTIME in the last 168 hours. Cardiac Enzymes: No results for input(s): CKTOTAL, CKMB, CKMBINDEX, TROPONINI in the last 168 hours. BNP (last 3 results) No results  for input(s): PROBNP in the last 8760 hours. HbA1C: No results for input(s): HGBA1C in the last 72 hours. CBG: Recent Labs  Lab 02/24/21 1145 02/24/21 1705 02/24/21 2157 02/25/21 0728 02/25/21 1129  GLUCAP 147* 121* 167* 152* 144*    Lipid Profile: No results for input(s): CHOL, HDL, LDLCALC, TRIG, CHOLHDL, LDLDIRECT in the last 72 hours. Thyroid Function Tests: No results for input(s): TSH, T4TOTAL, FREET4, T3FREE, THYROIDAB in the last 72 hours. Anemia Panel: No results for input(s): VITAMINB12, FOLATE, FERRITIN, TIBC, IRON, RETICCTPCT in the last 72 hours. Sepsis Labs: No results for input(s): PROCALCITON, LATICACIDVEN in the last 168 hours.  Recent Results (from the past 240 hour(s))  Culture, blood (routine x 2)     Status: None   Collection Time: 02/18/21 12:04 PM   Specimen: BLOOD  Result Value Ref Range Status   Specimen Description   Final    BLOOD RIGHT ANTECUBITAL Performed at [facility] [address], [place], [place]  [address]    Special Requests   Final    BOTTLES DRAWN AEROBIC ONLY Blood Culture adequate volume Performed at [facility] [address] [place], [place] [address]    Culture   Final    NO GROWTH 5 DAYS Performed at [facility], [place] [address], [place], [place] [address]    Report Status 02/23/2021 FINAL  Final  Culture, blood (routine x 2)     Status: None   Collection Time: 02/18/21 12:04 PM   Specimen: BLOOD LEFT HAND  Result Value Ref Range Status   Specimen Description   Final    BLOOD LEFT HAND Performed at [facility] [address] [place], [place] [address]    Special Requests   Final    BOTTLES DRAWN AEROBIC ONLY Blood Culture results may not be optimal due to an inadequate volume of blood received in culture bottles Performed at [facility] [address], [place], [place] [address]    Culture   Final    NO GROWTH 5 DAYS Performed at [facility], [place] [address], [place], [place] [address]    Report Status 02/23/2021 FINAL  Final  Urine Culture     Status: Abnormal   Collection Time: 02/18/21 12:48 PM   Specimen: In/Out Cath Urine  Result Value Ref Range Status   Specimen Description   Final    IN/OUT CATH URINE Performed at [facility] [address], [place], [place] [address]    Special Requests   Final    NONE Performed at [facility], [place] [address], [place],  [address]    Culture (A)  Final    40,000 COLONIES/mL KLEBSIELLA PNEUMONIAE >=100,000 COLONIES/mL ESCHERICHIA COLI    Report Status 02/21/2021 FINAL  Final   Organism ID, Bacteria KLEBSIELLA PNEUMONIAE (A)  Final   Organism ID, Bacteria ESCHERICHIA COLI (A)  Final      Susceptibility   Escherichia coli - MIC*    AMPICILLIN 8 SENSITIVE Sensitive     CEFAZOLIN <=4 SENSITIVE Sensitive     CEFEPIME <=0.12 SENSITIVE Sensitive     CEFTRIAXONE <=0.25 SENSITIVE Sensitive      CIPROFLOXACIN <=0.25 SENSITIVE Sensitive     GENTAMICIN <=1 SENSITIVE Sensitive     IMIPENEM <=0.25 SENSITIVE Sensitive     NITROFURANTOIN <=16 SENSITIVE Sensitive     TRIMETH/SULFA <=20 SENSITIVE Sensitive     AMPICILLIN/SULBACTAM <=2 SENSITIVE Sensitive  PIP/TAZO <=4 SENSITIVE Sensitive     * >=100,000 COLONIES/mL ESCHERICHIA COLI   Klebsiella pneumoniae - MIC*    AMPICILLIN >=32 RESISTANT Resistant     CEFAZOLIN <=4 SENSITIVE Sensitive     CEFEPIME <=0.12 SENSITIVE Sensitive     CEFTRIAXONE <=0.25 SENSITIVE Sensitive     CIPROFLOXACIN <=0.25 SENSITIVE Sensitive     GENTAMICIN <=1 SENSITIVE Sensitive     IMIPENEM <=0.25 SENSITIVE Sensitive     NITROFURANTOIN 64 INTERMEDIATE Intermediate     TRIMETH/SULFA <=20 SENSITIVE Sensitive     AMPICILLIN/SULBACTAM 8 SENSITIVE Sensitive     PIP/TAZO <=4 SENSITIVE Sensitive     * 40,000 COLONIES/mL KLEBSIELLA PNEUMONIAE      Radiology Studies: ECHO TEE  Result Date: 02/24/2021    TRANSESOPHOGEAL ECHO REPORT   Patient Name:   KAMEREN REPINSKI Mosher Date of Exam: 02/24/2021 Medical Rec #:  [id number]           Height:       64.0 in Accession #:    [id number]          Weight:       200.0 lb Date of Birth:  17-Nov-1955           BSA:          1.956 m Patient Age:    30 years            BP:           133/60 mmHg Patient Gender: F                   HR:           82 bpm. Exam Location:  Inpatient Procedure: Transesophageal Echo, Color Doppler and Saline Contrast Bubble Study Indications:     Stroke  History:         Patient has prior history of Echocardiogram examinations, most                  recent 02/19/2021. CAD; Risk Factors:Hypertension, Diabetes and                  Dyslipidemia.  Sonographer:     Darlina Sicilian RDCS Referring Phys:  [id number] RHONDA G BARRETT Diagnosing Phys: Kirk Ruths MD PROCEDURE: After discussion of the risks and benefits of a TEE, an informed consent was obtained from the patient. TEE procedure time was 5 minutes. The  transesophogeal probe was passed without difficulty through the esophogus of the patient. Imaged were  obtained with the patient in a left lateral decubitus position. Local oropharyngeal anesthetic was provided with Cetacaine. Sedation performed by different physician. The patient was monitored while under deep sedation. Anesthestetic sedation was provided intravenously by Anesthesiology: 192.97mg  of Propofol, 50mg  of Lidocaine. Image quality was good. The patient's vital signs; including heart rate, blood pressure, and oxygen saturation; remained stable throughout the procedure. The patient developed no complications during the procedure. IMPRESSIONS  1. Left ventricular ejection fraction, by estimation, is 55 to 60%. The left ventricle has normal function. The left ventricle has no regional wall motion abnormalities.  2. Right ventricular systolic function is normal. The right ventricular size is normal.  3. No left atrial/left atrial appendage thrombus was detected.  4. The mitral valve is normal in structure. Trivial mitral valve regurgitation.  5. The aortic valve is tricuspid. Aortic valve regurgitation is not visualized.  6. There is mild (Grade II) plaque involving the descending aorta.  7. Agitated saline contrast bubble study was  negative, with no evidence of any interatrial shunt. FINDINGS  Left Ventricle: Left ventricular ejection fraction, by estimation, is 55 to 60%. The left ventricle has normal function. The left ventricle has no regional wall motion abnormalities. The left ventricular internal cavity size was normal in size. Right Ventricle: The right ventricular size is normal. Right ventricular systolic function is normal. Left Atrium: Left atrial size was normal in size. No left atrial/left atrial appendage thrombus was detected. Right Atrium: Right atrial size was normal in size. Pericardium: There is no evidence of pericardial effusion. Mitral Valve: The mitral valve is normal in structure.  Trivial mitral valve regurgitation. Tricuspid Valve: The tricuspid valve is normal in structure. Tricuspid valve regurgitation is not demonstrated. Aortic Valve: The aortic valve is tricuspid. Aortic valve regurgitation is not visualized. Pulmonic Valve: The pulmonic valve was normal in structure. Pulmonic valve regurgitation is trivial. Aorta: The aortic root is normal in size and structure. There is mild (Grade II) plaque involving the descending aorta. IAS/Shunts: No atrial level shunt detected by color flow Doppler. Agitated saline contrast was given intravenously to evaluate for intracardiac shunting. Agitated saline contrast bubble study was negative, with no evidence of any interatrial shunt.   AORTA Ao Root diam: 2.90 cm Ao Asc diam:  2.70 cm Kirk Ruths MD Electronically signed by Kirk Ruths MD Signature Date/Time: 02/24/2021/2:44:01 PM    Final     Scheduled Meds:  (feeding supplement) PROSource Plus  30 mL Oral BID BM   aspirin EC  81 mg Oral Daily   enoxaparin (LOVENOX) injection  40 mg Subcutaneous Q24H   feeding supplement  237 mL Oral BID BM   insulin aspart  0-5 Units Subcutaneous QHS   insulin aspart  0-9 Units Subcutaneous TID WC   melatonin  3 mg Oral QHS   methocarbamol  500 mg Oral Q6H   metoprolol succinate  50 mg Oral Daily   mometasone-formoterol  2 puff Inhalation BID   multivitamin with minerals  1 tablet Oral Daily   pantoprazole  40 mg Oral Daily   polycarbophil  625 mg Oral BID   polyethylene glycol  17 g Oral Daily   QUEtiapine  12.5 mg Oral QHS   rosuvastatin  10 mg Oral Daily   saccharomyces boulardii  250 mg Oral BID   sodium chloride flush  3 mL Intravenous Q12H   sodium chloride flush  5 mL Intracatheter Daily   Continuous Infusions:  piperacillin-tazobactam (ZOSYN)  IV 3.375 g (02/25/21 1143)     LOS: 18 days   Marylu Lund, MD [facility] Pager On Amion  If 7PM-7AM, please contact night-coverage 02/25/2021, 1:55 PM

## 2021-02-26 LAB — COMPREHENSIVE METABOLIC PANEL
ALT: 35 U/L (ref 0–44)
AST: 27 U/L (ref 15–41)
Albumin: 3.1 g/dL — ABNORMAL LOW (ref 3.5–5.0)
Alkaline Phosphatase: 78 U/L (ref 38–126)
Anion gap: 8 (ref 5–15)
BUN: 18 mg/dL (ref 8–23)
CO2: 15 mmol/L — ABNORMAL LOW (ref 22–32)
Calcium: 9.1 mg/dL (ref 8.9–10.3)
Chloride: 117 mmol/L — ABNORMAL HIGH (ref 98–111)
Creatinine, Ser: 1.22 mg/dL — ABNORMAL HIGH (ref 0.44–1.00)
GFR, Estimated: 49 mL/min — ABNORMAL LOW (ref >60)
Glucose, Bld: 141 mg/dL — ABNORMAL HIGH (ref 70–99)
Potassium: 3.8 mmol/L (ref 3.5–5.1)
Sodium: 140 mmol/L (ref 135–145)
Total Bilirubin: 0.5 mg/dL (ref 0.3–1.2)
Total Protein: 6.9 g/dL (ref 6.5–8.1)

## 2021-02-26 LAB — CBC WITH DIFFERENTIAL/PLATELET
Abs Immature Granulocytes: 0.03 10*3/uL (ref 0.00–0.07)
Basophils Absolute: 0 10*3/uL (ref 0.0–0.1)
Basophils Relative: 0 %
Eosinophils Absolute: 0.3 10*3/uL (ref 0.0–0.5)
Eosinophils Relative: 3 %
HCT: 36.8 % (ref 36.0–46.0)
Hemoglobin: 11.9 g/dL — ABNORMAL LOW (ref 12.0–15.0)
Immature Granulocytes: 0 %
Lymphocytes Relative: 17 %
Lymphs Abs: 1.6 10*3/uL (ref 0.7–4.0)
MCH: 32.1 pg (ref 26.0–34.0)
MCHC: 32.3 g/dL (ref 30.0–36.0)
MCV: 99.2 fL (ref 80.0–100.0)
Monocytes Absolute: 1.1 10*3/uL — ABNORMAL HIGH (ref 0.1–1.0)
Monocytes Relative: 12 %
Neutro Abs: 6.1 10*3/uL (ref 1.7–7.7)
Neutrophils Relative %: 68 %
Platelets: 490 10*3/uL — ABNORMAL HIGH (ref 150–400)
RBC: 3.71 MIL/uL — ABNORMAL LOW (ref 3.87–5.11)
RDW: 15.6 % — ABNORMAL HIGH (ref 11.5–15.5)
WBC: 9.1 10*3/uL (ref 4.0–10.5)
nRBC: 0 % (ref 0.0–0.2)

## 2021-02-26 LAB — GLUCOSE, CAPILLARY
Glucose-Capillary: 144 mg/dL — ABNORMAL HIGH (ref 70–99)
Glucose-Capillary: 194 mg/dL — ABNORMAL HIGH (ref 70–99)
Glucose-Capillary: 171 mg/dL — ABNORMAL HIGH (ref 70–99)
Glucose-Capillary: 141 mg/dL — ABNORMAL HIGH (ref 70–99)

## 2021-02-26 NOTE — Progress Notes (Signed)
Urine catheterization completed twice during shift.   output @ 1011 from straight cath.   output @ 1753 from straight cath.

## 2021-02-26 NOTE — Progress Notes (Signed)
Pt refused assistance with self cath stated "I dont feel full" asked if she would like to attempt before she gets too full and it hurts, and still refused. Asked throughout shift with med passes if she felt like she needed to urinate and each time pt denied. Continue to monitor.

## 2021-02-26 NOTE — Progress Notes (Signed)
PROGRESS NOTE    [name] RecordsLavita Vincent [name]  JXB:[id number] DOB: [birth year] DOA: 02/06/2021 PCP: Margit HanksBoinapally, Vaidehi, MD    Brief Narrative:  12YQ:  65yo w/ a hx of DM2, HTN, HLD, anxiety/depression, GERD, obesity, and multiple abdomino-pelvic surgeries who presented to the ED with 5 days of worsening lower abdominal pain associated with loose stools and vomiting. In the ED CT revealed an incarcerated ventral hernia. EDP attempted reduction though symptoms did not improve. Labs notable for acute renal failure in the setting of dehydration. She [facility] for surgical consult, and repeat CT abdomen showed persistent hernia with bowel obstruction for which she was taken to the OR.  Assessment & Plan:   Active Problems:   CAD (coronary artery disease)   Essential hypertension   Acute renal failure (ARF) (HCC)   Diabetes mellitus without complication (HCC)   Incarcerated ventral hernia   SBO (small bowel obstruction) (HCC)   Obesity   Malnutrition of moderate degree  Incarcerated ventral hernia with SBO Anterior peirtoneal abscess Currently afebrile with resolved leukocytosis Status post repair with small bowel resection and LOA 10/31 Persisting abdominal discomfort and climbing WBC raised concern leading to CT abdomen pelvis 11/4 which noted questionable fluid collection within the abdomen which was drained by IR on 11/5 Completed 14 days of IV Zosyn as of 11/19 Drain removed per Surgery -Cont wound vac MWF per General Surgery   Acute renal failure- improved Metabolic acidosis Acute urinary retention- resolved (pt self cath) Cr peaked to 1.34, showing improvement, currently 1.22 In and out prn (pt reports self cath about 3 times a day due to urinary diversion) Pt has not been receiving adequate frequency of I/O caths, thus yielding over 1L per I/o cath per chart review. Likely contributes to rising Cr today Recheck bmet in AM   E.coli UTI Urine culture >100,000 E.coli, 40,000  klebsiella pneumoniae  Continued on IV zosyn per above Afebrile   Hypokalemia Remains low Will replace Repeat bmet in AM   Acute CVA MRI showed acute ischemic nonhemorrhagic cortical infarct cortical infarct, evolving late subacute right PCA territory infarct involving the right occipital lobe, associated petechial blood products without frank intraparenchymal hematoma or significant mass-effect MRA head and neck no emergent findings Echo with EF 65 to 70%, grade 1 diastolic dysfunction, bubble study negative with no evidence of inter-atrial shunt LDL, 97, A1c--> 6.2 Start ASA, continue home crestor PT/OT following Neurology consulted- suspicious of embolic in nature, rec further work up TEE performed 11/18, reviewed. 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 - history of cystectomy and right colon pouch for interstitial cystitis 1990s - pouch stone is found to be nonobstructing   DM2 Continue with SSI, accuchecks   HTN BP stable   COPD Stable Continue usual Dulera and as needed albuterol   GERD PPI   Obesity - Body mass index is 34.33 kg/m. Recommend continued diet and lifestyle modification   DVT prophylaxis: Lovenox subq Code Status: Full Family Communication: Pt in room, family not at bedside  Status is: Inpatient  Remains inpatient appropriate because: Severity of illness   Consultants:  General Surgery  IR  Procedures:  10/31 ventral hernia repair  with small bowel resection and LOA 11/5 CT-guided drainage of anterior peritoneal abscess in IR -10 French drain left in place none 02/24/21  TEE  Antimicrobials: Anti-infectives (From admission, onward)    Start     Dose/Rate Route Frequency Ordered Stop   02/12/21 1200  piperacillin-tazobactam (ZOSYN) IVPB 3.375 g        3.375 g 12.5 mL/hr over 240 Minutes Intravenous Every 8 hours 02/12/21 0950 02/26/21 1600   02/06/21 2003  ceFAZolin (ANCEF) 2-4 GM/100ML-% IVPB       Note to Pharmacy: Ponciano Ort   : cabinet override      02/06/21 2003 02/06/21 2046   02/06/21 1945  ceFAZolin (ANCEF) IVPB 2g/100 mL premix        2 g 200 mL/hr over 30 Minutes Intravenous On call to O.R. 02/06/21 1914 02/06/21 2028       Subjective:  Without complaints this AM  Objective: Vitals:   02/26/21 0631 02/26/21 0856 02/26/21 0915 02/26/21 1323  BP: (!) 141/67  124/86 (!) 133/56  Pulse: 60  69 66  Resp: 16   19  Temp: 98.1 F (36.7 C)   98.2 F (36.8 C)  TempSrc: Oral   Oral  SpO2: 100% 96%  100%  Weight:      Height:        Intake/Output Summary (Last 24 hours) at 02/26/2021 1553 Last data filed at 02/26/2021 1300 Gross per 24 hour  Intake 240 ml  Output 1000 ml  Net -760 ml    Filed Weights   02/06/21 1053 02/24/21 1249  Weight: 90.7 kg 90.7 kg    Examination: General exam: Awake, laying in bed, in nad Respiratory system: Normal respiratory effort, no wheezing Cardiovascular system: regular rate, s1, s2 Gastrointestinal system: Soft, nondistended, positive BS Central nervous system: CN2-12 grossly intact, strength intact Extremities: Perfused, no clubbing Skin: Normal skin turgor, no notable skin lesions seen Psychiatry: Mood normal // no visual hallucinations    Data Reviewed: I have personally reviewed following labs and imaging studies  CBC: Recent Labs  Lab 02/22/21 0517 02/23/21 0446 02/24/21 0438 02/25/21 0530 02/26/21 0540  WBC 9.1 9.4 7.4 7.3 9.1  NEUTROABS 6.2 6.2 5.1 4.4 6.1  HGB 11.5* 12.0 11.8* 11.9* 11.9*  HCT 35.1* 36.6 36.2 37.0 36.8  MCV 98.0 98.7 99.2 98.1 99.2  PLT 627* 551* 636*  598* 490*    Basic Metabolic Panel: Recent Labs  Lab 02/21/21 0502 02/22/21 0517 02/23/21 0446 02/24/21 0438 02/25/21 0530 02/26/21 0540  NA 143 142 140 143 141 140  K 3.1* 3.2* 3.6 3.9 3.3* 3.8  CL 117* 113* 115* 115* 116* 117*  CO2 16* 19* 15* 18* 16* 15*  GLUCOSE 135* 139* 133* 163* 159* 141*  BUN 12 10 12 15 17 18   CREATININE 0.91 1.03* 1.11* 1.14* 1.17* 1.22*  CALCIUM 9.1 9.1 9.2 9.7 9.2 9.1  MG 1.8  --   --   --   --   --     GFR: Estimated Creatinine Clearance: 49.5 mL/min (A) (by C-G formula based on SCr of 1.22 mg/dL (H)). Liver Function Tests: Recent Labs  Lab 02/22/21 0517 02/23/21 0446 02/24/21 0438 02/25/21 0530 02/26/21 0540  AST 45* 37 31 32 27  ALT 63* 56* 48* 43 35  ALKPHOS 82 76 78 80 78  BILITOT 0.6 0.4 0.3 0.7 0.5  PROT 7.5 7.2 7.3 7.2 6.9  ALBUMIN 3.1* 3.0* 3.0* 3.2* 3.1*    No results for input(s): LIPASE, AMYLASE  in the last 168 hours. No results for input(s): AMMONIA in the last 168 hours. Coagulation Profile: No results for input(s): INR, PROTIME in the last 168 hours. Cardiac Enzymes: No results for input(s): CKTOTAL, CKMB, CKMBINDEX, TROPONINI in the last 168 hours. BNP (last 3 results) No results for input(s): PROBNP in the last 8760 hours. HbA1C: No results for input(s): HGBA1C in the last 72 hours. CBG: Recent Labs  Lab 02/25/21 1129 02/25/21 1710 02/25/21 1953 02/26/21 0727 02/26/21 1126  GLUCAP 144* 132* 168* 144* 194*    Lipid Profile: No results for input(s): CHOL, HDL, LDLCALC, TRIG, CHOLHDL, LDLDIRECT in the last 72 hours. Thyroid Function Tests: No results for input(s): TSH, T4TOTAL, FREET4, T3FREE, THYROIDAB in the last 72 hours. Anemia Panel: No results for input(s): VITAMINB12, FOLATE, FERRITIN, TIBC, IRON, RETICCTPCT in the last 72 hours. Sepsis Labs: No results for input(s): PROCALCITON, LATICACIDVEN in the last 168 hours.  Recent Results (from the past 240 hour(s))  Culture, blood (routine x 2)      Status: None   Collection Time: 02/18/21 12:04 PM   Specimen: BLOOD  Result Value Ref Range Status   Specimen Description   Final    BLOOD RIGHT ANTECUBITAL Performed at [facility] [address], [place], [place] [address]    Special Requests   Final    BOTTLES DRAWN AEROBIC ONLY Blood Culture adequate volume Performed at [facility] [address], [place], [place] [address]    Culture   Final    NO GROWTH 5 DAYS Performed at [facility], [place] [address] [place], [place] [address]    Report Status 02/23/2021 FINAL  Final  Culture, blood (routine x 2)     Status: None   Collection Time: 02/18/21 12:04 PM   Specimen: BLOOD LEFT HAND  Result Value Ref Range Status   Specimen Description   Final    BLOOD LEFT HAND Performed at [facility] [address] [place], [place] [address]    Special Requests   Final    BOTTLES DRAWN AEROBIC ONLY Blood Culture results may not be optimal due to an inadequate volume of blood received in culture bottles Performed at [facility] [address], [place], [place] [address]    Culture   Final    NO GROWTH 5 DAYS Performed at [facility], [place] [address], [place], [place] [address]    Report Status 02/23/2021 FINAL  Final  Urine Culture     Status: Abnormal   Collection Time: 02/18/21 12:48 PM   Specimen: In/Out Cath Urine  Result Value Ref Range Status   Specimen Description   Final    IN/OUT CATH URINE Performed at [facility] [address] [place], [place] [address]    Special Requests   Final    NONE Performed at [facility], [place] [address], [place],  [address]    Culture (A)  Final    40,000 COLONIES/mL KLEBSIELLA PNEUMONIAE >=100,000 COLONIES/mL ESCHERICHIA COLI    Report Status 02/21/2021 FINAL  Final   Organism ID, Bacteria KLEBSIELLA PNEUMONIAE (A)  Final   Organism ID, Bacteria  ESCHERICHIA COLI (A)  Final      Susceptibility   Escherichia coli - MIC*    AMPICILLIN 8 SENSITIVE Sensitive     CEFAZOLIN <=4 SENSITIVE Sensitive     CEFEPIME <=0.12 SENSITIVE Sensitive     CEFTRIAXONE <=0.25 SENSITIVE Sensitive  CIPROFLOXACIN <=0.25 SENSITIVE Sensitive     GENTAMICIN <=1 SENSITIVE Sensitive     IMIPENEM <=0.25 SENSITIVE Sensitive     NITROFURANTOIN <=16 SENSITIVE Sensitive     TRIMETH/SULFA <=20 SENSITIVE Sensitive     AMPICILLIN/SULBACTAM <=2 SENSITIVE Sensitive     PIP/TAZO <=4 SENSITIVE Sensitive     * >=100,000 COLONIES/mL ESCHERICHIA COLI   Klebsiella pneumoniae - MIC*    AMPICILLIN >=32 RESISTANT Resistant     CEFAZOLIN <=4 SENSITIVE Sensitive     CEFEPIME <=0.12 SENSITIVE Sensitive     CEFTRIAXONE <=0.25 SENSITIVE Sensitive     CIPROFLOXACIN <=0.25 SENSITIVE Sensitive     GENTAMICIN <=1 SENSITIVE Sensitive     IMIPENEM <=0.25 SENSITIVE Sensitive     NITROFURANTOIN 64 INTERMEDIATE Intermediate     TRIMETH/SULFA <=20 SENSITIVE Sensitive     AMPICILLIN/SULBACTAM 8 SENSITIVE Sensitive     PIP/TAZO <=4 SENSITIVE Sensitive     * 40,000 COLONIES/mL KLEBSIELLA PNEUMONIAE      Radiology Studies: No results found.  Scheduled Meds:  (feeding supplement) PROSource Plus  30 mL Oral BID BM   aspirin EC  81 mg Oral Daily   enoxaparin (LOVENOX) injection  40 mg Subcutaneous Q24H   feeding supplement  237 mL Oral BID BM   insulin aspart  0-5 Units Subcutaneous QHS   insulin aspart  0-9 Units Subcutaneous TID WC   melatonin  3 mg Oral QHS   methocarbamol  500 mg Oral Q6H   metoprolol succinate  50 mg Oral Daily   mometasone-formoterol  2 puff Inhalation BID   multivitamin with minerals  1 tablet Oral Daily   pantoprazole  40 mg Oral Daily   polycarbophil  625 mg Oral BID   polyethylene glycol  17 g Oral Daily   QUEtiapine  12.5 mg Oral QHS   rosuvastatin  10 mg Oral Daily   saccharomyces boulardii  250 mg Oral BID   sodium chloride flush  3 mL  Intravenous Q12H   sodium chloride flush  5 mL Intracatheter Daily   Continuous Infusions:  piperacillin-tazobactam (ZOSYN)  IV 3.375 g (02/26/21 1328)     LOS: 19 days   Marylu Lund, MD [facility] Pager On Amion  If 7PM-7AM, please contact night-coverage 02/26/2021, 3:53 PM

## 2021-02-27 ENCOUNTER — Encounter (HOSPITAL_COMMUNITY): Payer: Self-pay | Admitting: Cardiology

## 2021-02-27 LAB — GLUCOSE, CAPILLARY
Glucose-Capillary: 134 mg/dL — ABNORMAL HIGH (ref 70–99)
Glucose-Capillary: 162 mg/dL — ABNORMAL HIGH (ref 70–99)

## 2021-02-27 LAB — CBC WITH DIFFERENTIAL/PLATELET
Abs Immature Granulocytes: 0.03 10*3/uL (ref 0.00–0.07)
Basophils Absolute: 0 10*3/uL (ref 0.0–0.1)
Basophils Relative: 0 %
Eosinophils Absolute: 0.3 10*3/uL (ref 0.0–0.5)
Eosinophils Relative: 3 %
HCT: 32.5 % — ABNORMAL LOW (ref 36.0–46.0)
Hemoglobin: 10.7 g/dL — ABNORMAL LOW (ref 12.0–15.0)
Immature Granulocytes: 0 %
Lymphocytes Relative: 22 %
Lymphs Abs: 2 10*3/uL (ref 0.7–4.0)
MCH: 32.5 pg (ref 26.0–34.0)
MCHC: 32.9 g/dL (ref 30.0–36.0)
MCV: 98.8 fL (ref 80.0–100.0)
Monocytes Absolute: 1.1 10*3/uL — ABNORMAL HIGH (ref 0.1–1.0)
Monocytes Relative: 13 %
Neutro Abs: 5.5 10*3/uL (ref 1.7–7.7)
Neutrophils Relative %: 62 %
Platelets: 491 10*3/uL — ABNORMAL HIGH (ref 150–400)
RBC: 3.29 MIL/uL — ABNORMAL LOW (ref 3.87–5.11)
RDW: 15.2 % (ref 11.5–15.5)
WBC: 8.9 10*3/uL (ref 4.0–10.5)
nRBC: 0 % (ref 0.0–0.2)

## 2021-02-27 LAB — COMPREHENSIVE METABOLIC PANEL
ALT: 28 U/L (ref 0–44)
AST: 19 U/L (ref 15–41)
Albumin: 2.9 g/dL — ABNORMAL LOW (ref 3.5–5.0)
Alkaline Phosphatase: 75 U/L (ref 38–126)
Anion gap: 8 (ref 5–15)
BUN: 15 mg/dL (ref 8–23)
CO2: 16 mmol/L — ABNORMAL LOW (ref 22–32)
Calcium: 9 mg/dL (ref 8.9–10.3)
Chloride: 115 mmol/L — ABNORMAL HIGH (ref 98–111)
Creatinine, Ser: 1.08 mg/dL — ABNORMAL HIGH (ref 0.44–1.00)
GFR, Estimated: 57 mL/min — ABNORMAL LOW (ref >60)
Glucose, Bld: 150 mg/dL — ABNORMAL HIGH (ref 70–99)
Potassium: 3.3 mmol/L — ABNORMAL LOW (ref 3.5–5.1)
Sodium: 139 mmol/L (ref 135–145)
Total Bilirubin: 0.4 mg/dL (ref 0.3–1.2)
Total Protein: 6.7 g/dL (ref 6.5–8.1)

## 2021-02-27 MED ORDER — SACCHAROMYCES BOULARDII 250 MG PO CAPS: 250.0000 mg | ORAL_CAPSULE | Freq: Two times a day (BID) | ORAL | 0 refills | Status: AC

## 2021-02-27 MED ORDER — POLYETHYLENE GLYCOL 3350 17 G PO PACK: 17.0000 g | PACK | Freq: Every day | ORAL | 0 refills | Status: AC

## 2021-02-27 MED ORDER — CALCIUM POLYCARBOPHIL 625 MG PO TABS: 625.0000 mg | ORAL_TABLET | Freq: Two times a day (BID) | ORAL | 0 refills | Status: AC

## 2021-02-27 MED ORDER — INSULIN GLARGINE 100 UNIT/ML ~~LOC~~ SOLN: 15.0000 [IU] | Freq: Every day | SUBCUTANEOUS | 11 refills | Status: AC

## 2021-02-27 MED ORDER — METHOCARBAMOL 500 MG PO TABS
500.0000 mg | ORAL_TABLET | Freq: Three times a day (TID) | ORAL | 0 refills | Status: DC | PRN
Start: 2021-02-27 — End: 2023-03-15

## 2021-02-27 MED ORDER — POTASSIUM CHLORIDE CRYS ER 20 MEQ PO TBCR
60.0000 meq | EXTENDED_RELEASE_TABLET | Freq: Once | ORAL | Status: AC
  Administered 2021-02-27: 60 meq via ORAL
  Filled 2021-02-27: qty 3

## 2021-02-27 NOTE — Progress Notes (Addendum)
Inpatient Diabetes Program Recommendations  AACE/ADA: New Consensus Statement on Inpatient Glycemic Control (2015)  Target Ranges:  Prepandial:   less than 140 mg/dL      Peak postprandial:   less than 180 mg/dL (1-2 hours)      Critically ill patients:  140 - 180 mg/dL    Latest Reference Range & Units 02/25/21 07:28 02/25/21 11:29 02/25/21 17:10 02/25/21 19:53  Glucose-Capillary 70 - 99 mg/dL 409 (H)  2 units Novolog  144 (H)  1 unit Novolog  132 (H)  1 unit Novolog  168 (H)    Latest Reference Range & Units 02/26/21 07:27 02/26/21 11:26 02/26/21 15:59 02/26/21 20:58  Glucose-Capillary 70 - 99 mg/dL 811 (H)  1 unit Novolog  194 (H)  2 units Novolog  171 (H)  2 units Novolog  141 (H)    Latest Reference Range & Units 02/27/21 07:39 02/27/21 12:26  Glucose-Capillary 70 - 99 mg/dL 914 (H)  1 units Novolog  162 (H)  2 units Novolog     Latest Reference Range & Units 02/07/21 05:46  Hemoglobin A1C 4.8 - 5.6 % 6.2 (H)     History: DM  Home DM Meds: Lantus 50 units QHS       Victoza 1.8 mg daily       Metformin 500 mg daily  Current Orders: Novolog Sensitive Correction Scale/ SSI (0-9 units) TID AC + HS   Endocrinologist: Dr. Everette Rank with [facility] Last seen 11/16/2020 No changes made to medication regimen at that visit Was told to contact ENDO when Victoza ran out to start new med Ozempic once weekly and stop the Victoza    MD- Looks like pt's PO intake has been pretty poor once PO diet restarted   Has been requiring very little Insulin in the hospital--No basal insulin has been given since admission  Recommend the following for discharge:  1. Have pt resume Lantus at 15 units QHS (1/3 total home dose) May need to increase back to home dose once PO intake improves Make sure pt calls Endocrinologist with questions/concerns  2. Restart Metformin at home dose  3. Stop Victoza for now--Looks like pt was supposed to switch to Ozempic once weekly per ENDO  notes--She should call her ENDO to see when they want her to resume her Ozempic (is she even started it)   --Will follow patient during hospitalization--  Ambrose Finland RN, MSN, CDE Diabetes Coordinator Inpatient Glycemic Control Team Team Pager: [phone] (8a-5p)

## 2021-02-27 NOTE — Progress Notes (Signed)
Patient ID: Laurie [name], female   DOB: 1955/12/18, 65 y.o.   MRN: [id number] [facility] Surgery Progress Note  3 Days Post-Op  Subjective: CC-  Doing well. Abdomen feels about the same. Still has some soreness but no worsening pain. Denies n/v. BM yesterday. Tolerating diet.  WBC 8.9, VSS   Objective: Vital signs in last 24 hours: Temp:  [97.7 F (36.5 C)-98.2 F (36.8 C)] 97.9 F (36.6 C) (11/21 0624) Pulse Rate:  [60-66] 63 (11/21 1102) Resp:  [16-19] 18 (11/21 0624) BP: (123-147)/(56-71) 123/67 (11/21 1102) SpO2:  [96 %-100 %] 98 % (11/21 0836) Weight:  [85.9 kg] 85.9 kg (11/21 0500) Last BM Date: 02/25/21  Intake/Output from previous day: 11/20 0701 - 11/21 0700 In: 240 [P.O.:240] Out: 1975 [Urine:1975] Intake/Output this shift: No intake/output data recorded.  PE: Gen:  Alert, NAD, pleasant Pulm: Rate and effort normal Abd: Vac to midline with good seal.  Abd soft, nondistended, nontender. Drain site with granulation tissue and no active drainage  Lab Results:  Recent Labs    02/26/21 0540 02/27/21 0441  WBC 9.1 8.9  HGB 11.9* 10.7*  HCT 36.8 32.5*  PLT 490* 491*   BMET Recent Labs    02/26/21 0540 02/27/21 0441  NA 140 139  K 3.8 3.3*  CL 117* 115*  CO2 15* 16*  GLUCOSE 141* 150*  BUN 18 15  CREATININE 1.22* 1.08*  CALCIUM 9.1 9.0   PT/INR No results for input(s): LABPROT, INR in the last 72 hours. CMP     Component Value Date/Time   NA 139 02/27/2021 0441   K 3.3 (L) 02/27/2021 0441   CL 115 (H) 02/27/2021 0441   CO2 16 (L) 02/27/2021 0441   GLUCOSE 150 (H) 02/27/2021 0441   BUN 15 02/27/2021 0441   CREATININE 1.08 (H) 02/27/2021 0441   CALCIUM 9.0 02/27/2021 0441   PROT 6.7 02/27/2021 0441   ALBUMIN 2.9 (L) 02/27/2021 0441   AST 19 02/27/2021 0441   ALT 28 02/27/2021 0441   ALKPHOS 75 02/27/2021 0441   BILITOT 0.4 02/27/2021 0441   GFRNONAA 57 (L) 02/27/2021 0441   GFRAA 33 (L) 07/01/2015 0352   Lipase      Component Value Date/Time   LIPASE 34 02/06/2021 1315       Studies/Results: No results found.  Anti-infectives: Anti-infectives (From admission, onward)    Start     Dose/Rate Route Frequency Ordered Stop   02/12/21 1200  piperacillin-tazobactam (ZOSYN) IVPB 3.375 g        3.375 g 12.5 mL/hr over 240 Minutes Intravenous Every 8 hours 02/12/21 0950 02/26/21 1600   02/06/21 2003  ceFAZolin (ANCEF) 2-4 GM/100ML-% IVPB       Note to Pharmacy: Ponciano Ort   : cabinet override      02/06/21 2003 02/06/21 2046   02/06/21 1945  ceFAZolin (ANCEF) IVPB 2g/100 mL premix        2 g 200 mL/hr over 30 Minutes Intravenous On call to O.R. 02/06/21 1914 02/06/21 2028        Assessment/Plan Ventral hernia with SBO POD#21 S/P ventral hernia repair with small bowel resection, LOA 10/31 Dr. Sheliah Hatch - s/p IR drain 11/5, culture with somewhat resistant E coli but sensitive to zosyn - completed 14 days zosyn on 11/19 - CT 11/10 showed resolution of drained fluid collection with drain in place and a few dots of residual air, no new concerning findings. No definite enterocutaneous fistula but is high risk. - Drain  study 11/17 without enteric fistula. Drain removed. - Wound vac MWF - I have asked bedside RN to let me know when she plans to change this so I can look at it with her - PT/OT, encourage mobilization - still recommending HH PT/OT - Encourage PO intake and continue bowel regimen, dietician following - stable for discharge from surgical standpoint.   FEN: reg diet VTE: lovenox, ASA ID: Ancef pre-op, zosyn 11/6 - 11/19. WBC wnl, afebrile.   Per [facility] New CVA - Neurology following and working up Hx of cystectomy with urostomy in place - large stone noted in ileal conduit on CT, OP follow up with [facility] recommended  CKD T2DM with gastroparesis  HTN Chronic candidal esophagitis  GERD Hx of Hep C Glaucoma  B12 deficiency and iron deficiency anemia  CAD Hx of asthma    LOS:  20 days    Franne Forts, [facility] Surgery 02/27/2021, 11:26 AM Please see Amion for pager number during day hours 7:00am-4:30pm

## 2021-02-27 NOTE — Discharge Summary (Signed)
Physician Discharge Summary  Laurie [name] [id number] DOB: May 29, 1955 DOA: 02/06/2021  PCP: Cindra Eves, MD  Admit date: 02/06/2021 Discharge date: 02/27/2021  Admitted From: Home Disposition:  Home  Recommendations for Outpatient Follow-up:  Follow up with PCP in 1-2 weeks Follow up with General Surgery as scheduled  Home Health:PT, OT, RN, Aide   Discharge Condition:Stable CODE STATUS:Full Diet recommendation: Diabetic   Brief/Interim Summary: [name] w/ a hx of DM2, HTN, HLD, anxiety/depression, GERD, obesity, and multiple abdomino-pelvic surgeries who presented to the ED with 5 days of worsening lower abdominal pain associated with loose stools and vomiting. In the ED CT revealed an incarcerated ventral hernia. EDP attempted reduction though symptoms did not improve. Labs notable for acute renal failure in the setting of dehydration. She was transferred to [facility] ED for surgical consult, and repeat CT abdomen showed persistent hernia with bowel obstruction for which she was taken to the OR.  Discharge Diagnoses:  Active Problems:   CAD (coronary artery disease)   Essential hypertension   Acute renal failure (ARF) (HCC)   Diabetes mellitus without complication (HCC)   Incarcerated ventral hernia   SBO (small bowel obstruction) (HCC)   Obesity   Malnutrition of moderate degree  Incarcerated ventral hernia with SBO Anterior peirtoneal abscess Now afebrile with resolved leukocytosis Status post repair with small bowel resection and LOA 10/31 Persisting abdominal discomfort and climbing WBC raised concern leading to CT abdomen pelvis 11/4 which noted questionable fluid collection within the abdomen which was drained by IR on 11/5 Completed 14 days of IV Zosyn as of 11/19 Drain removed per Surgery -Cont wound vac MWF per General Surgery -Discussed with general surgery, okay for discharge today   Acute renal failure- improved Metabolic acidosis Acute urinary  retention- resolved (pt self cath) Cr peaked to 1.34, showing improvement, now improved In and out prn (pt reports self cath about 3 times a day due to urinary diversion) Pt has not been receiving adequate frequency of I/O caths, thus yielding over 1L per I/o cath per chart review. 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 Terance Hart at [facility] - history of cystectomy and right colon pouch for interstitial cystitis 1990s - pouch stone is found to be nonobstructing   DM2 Continue with SSI, accuchecks while in hospital Of note, patient has required much less insulin than her normal insulin requirements prior to admission given decreased p.o. intake Discussed with diabetic coordinator, recommendation for decreasing  long-acting  insulin to 15 units daily, continue metformin, hold Victoza Patient to follow-up with endocrinology   HTN BP stable   COPD Stable Continue usual Dulera and as needed albuterol   GERD PPI   Obesity - Body mass index is 34.33 kg/m. Recommend continued diet and lifestyle modification    Discharge Instructions   Allergies as of 02/27/2021       Reactions   Buchu-cornsilk-ch Grass-hydran Other (See Comments)   Dehydration. Elevated creatini Other reaction(s): Other (See Comments) Dehydration, increase in creatinine   Celebrex [celecoxib] Itching   Ciprofloxacin Other (See Comments)   No history of rash or SOB. Had kidney problems when she took cipro but was not told she had interstitial nephritis. Other reaction(s): Other (See Comments), Unknown Kidney problems   Cymbalta [duloxetine Hcl]    GI upset / constipation.    Dilaudid [hydromorphone Hcl] Itching   Fentanyl And Related Itching   Glucophage [metformin Hcl] Other (See Comments)   Stopped due to increase creat   Oxycodone Itching   Pregabalin Other (See Comments)   CNS disorder. Headache Other reaction(s): Other (See Comments) Headache, CNS disorder   Hydrocodone-acetaminophen Rash   Morphine And Related Rash, Itching   Sulfa Antibiotics Rash   Vicodin [hydrocodone-acetaminophen] Rash        Medication List     STOP taking these medications    amLODipine 10 MG tablet Commonly known as: NORVASC   dicyclomine 20 MG tablet Commonly known as: BENTYL   gabapentin 300 MG capsule Commonly known as: NEURONTIN   isosorbide mononitrate 60 MG 24 hr tablet Commonly known as: IMDUR   LIRAGLUTIDE [place]   lisinopril 5 MG tablet Commonly known as: ZESTRIL   loperamide 2 MG capsule Commonly known as: IMODIUM   magnesium oxide 400 MG tablet Commonly known as: MAG-OX       TAKE these medications    albuterol 108 (90 Base) MCG/ACT inhaler Commonly known as: VENTOLIN HFA Inhale 2 puffs into the lungs  every 4 (four) hours as needed for wheezing or shortness of breath.   aspirin EC 81 MG tablet Take 81 mg by mouth daily.   budesonide-formoterol 80-4.5 MCG/ACT inhaler Commonly known as: SYMBICORT Inhale 2 puffs into the lungs 2 (two) times daily.   cholecalciferol 1000 units tablet Commonly known as: VITAMIN D Take 1,000 Units by mouth daily.   cyanocobalamin 1000 MCG tablet Take 1,000 mcg by mouth daily.   FLUoxetine 20 MG capsule Commonly known as: PROZAC Take 20 mg by mouth daily.   fluticasone 50 MCG/ACT nasal spray Commonly known as: FLONASE Place 2 sprays into both nostrils daily as needed for allergies or rhinitis.   glucagon 1 MG injection Inject 1 mg into the vein once as needed (severe insulin reaction).   hydrocortisone 2.5 % rectal cream Commonly known as: ANUSOL-HC Place 1 application rectally 3 (three) times daily as needed for hemorrhoids.   insulin glargine 100 UNIT/ML injection Commonly known as: LANTUS Inject 0.15 mLs (15 Units total) into the skin at bedtime. What changed: how much to take   lidocaine 5 % ointment Commonly known as: XYLOCAINE Apply 1 application topically 4 (four) times daily as needed for moderate pain.   metFORMIN 500 MG 24 hr tablet Commonly known as: GLUCOPHAGE-XR Take 500 mg by mouth daily with breakfast.   methocarbamol 500 MG tablet Commonly known as: ROBAXIN Take 1 tablet (500 mg total) by mouth every 8 (eight) hours as needed for muscle spasms.   metoprolol succinate  100 MG 24 hr tablet Commonly known as: TOPROL-XL Take 100 mg by mouth daily. Take with or immediately following a meal.   multivitamin with minerals Tabs tablet Take 1 tablet by mouth daily.   Nitroglycerin 0.4 % Oint Place 1 application rectally 2 (two) times daily as needed (for chest pain.).   nitroGLYCERIN 0.4 MG SL tablet Commonly known as: NITROSTAT Place 0.4 mg under the tongue every 5 (five) minutes as needed for chest pain.   pantoprazole  20 MG tablet Commonly known as: PROTONIX Take 20 mg by mouth daily.   polycarbophil 625 MG tablet Commonly known as: FIBERCON Take 1 tablet (625 mg total) by mouth 2 (two) times daily.   polyethylene glycol 17 g packet Commonly known as: MIRALAX / GLYCOLAX Take 17 g by mouth daily. Start taking on: February 28, 2021   promethazine 25 MG tablet Commonly known as: PHENERGAN Take 25 mg by mouth every 6 (six) hours as needed for nausea or vomiting.   ranolazine 500 MG 12 hr tablet Commonly known as: RANEXA Take 500 mg by mouth 2 (two) times daily.   rosuvastatin 10 MG tablet Commonly known as: CRESTOR Take 10 mg by mouth daily.   saccharomyces boulardii 250 MG capsule Commonly known as: FLORASTOR Take 1 capsule (250 mg total) by mouth 2 (two) times daily.   temazepam 7.5 MG capsule Commonly known as: RESTORIL Take 7.5 mg by mouth at bedtime as needed for sleep.   traMADol 50 MG tablet Commonly known as: ULTRAM Take 1 tablet (50 mg total) every 6 (six) hours as needed by mouth.   traZODone 50 MG tablet Commonly known as: DESYREL Take 50 mg by mouth at bedtime.   triamcinolone cream 0.5 % Commonly known as: KENALOG Apply 1 application topically 2 (two) times daily as needed (for skin irritation).               Durable Medical Equipment  (From admission, onward)           Start     Ordered   02/17/21 1337  For home use only DME Tub bench  Once        02/17/21 1336   02/17/21 1336  For home use only DME 3 n 1  Once        02/17/21 1336   02/17/21 1335  For home use only DME Walker  Once       Question:  Patient needs a walker to treat with the following condition  Answer:  Post-op pain   02/17/21 1336   02/13/21 1531  For home use only DME Negative pressure wound device  Once       Question Answer Comment  Frequency of dressing change 3 times per week   Length of need 3 Months   Dressing type Foam   Amount of suction 125 mm/Hg   Pressure application  Continuous pressure   Supplies 10 canisters and 15 dressings per month for duration of therapy      02/13/21 1530            Follow-up Information     Kinsinger, Arta Bruce, MD. Go on 03/16/2021.   Specialty: General Surgery Why: follow up on 12/8 at 9:30am. please arrive 30 minutes prior to appointment time to complete check in process and bring photo ID and insurance card if you have them Contact information: [place] [address] [phone]         [employer],  Vaidehi, MD Follow up in 1 week(s).   Specialty: Internal Medicine Why: Hospital follow up Contact information: [place] [address] [phone]         Lind Guest. Schedule an appointment as soon as possible for a visit.   Specialty: Internal Medicine Why: Hospital follow up Contact information: [place] [address] [phone]                Allergies  Allergen Reactions   Buchu-Cornsilk-Ch Grass-Hydran Other (See Comments)    Dehydration. Elevated creatini Other reaction(s): Other (See Comments) Dehydration, increase in creatinine   Celebrex [Celecoxib] Itching   Ciprofloxacin Other (See Comments)    No history of rash or SOB. Had kidney problems when she took cipro but was not told she had interstitial nephritis. Other reaction(s): Other (See Comments), Unknown Kidney problems   Cymbalta [Duloxetine Hcl]     GI upset / constipation.    Dilaudid [Hydromorphone Hcl] Itching   Fentanyl And Related Itching   Glucophage [Metformin Hcl] Other (See Comments)    Stopped due to increase creat   Oxycodone Itching   Pregabalin Other (See Comments)    CNS disorder. Headache Other reaction(s): Other (See Comments) Headache, CNS disorder   Hydrocodone-Acetaminophen Rash   Morphine And Related Rash and Itching   Sulfa Antibiotics Rash   Vicodin [Hydrocodone-Acetaminophen] Rash    Consultations: General  surgery Interventional radiology  Procedures/Studies: CT ABDOMEN PELVIS WO CONTRAST  Result Date: 02/06/2021 CLINICAL DATA:  Abdominal pain, hernia suspected. Status post manual attempted reduction. Vomiting and diarrhea history of urinary diversion. EXAM: CT ABDOMEN AND PELVIS WITHOUT CONTRAST TECHNIQUE: Multidetector CT imaging of the abdomen and pelvis was performed following the standard protocol without IV contrast. COMPARISON:  CT 2-1/2 hours ago. FINDINGS: Lower chest: Stable chronic right lung base scarring. No acute findings or change from earlier today. Hepatobiliary: No focal liver abnormality is seen. Status post cholecystectomy. No biliary dilatation. Pancreas: Mild parenchymal atrophy. No ductal dilatation or inflammation. Spleen: Normal in size without focal abnormality. Adrenals/Urinary Tract: No adrenal nodule. Stable mild left adrenal thickening. There is no hydronephrosis or renal calculi. Status post cystectomy with urinary diversion. No ureteral dilatation. Stable 15 mm stone in the ureteral confluence, series 2, image 78. The ostomy is nondilated. Stomach/Bowel: Small-bowel obstruction secondary to umbilical hernia without significant change in the interim. Prominently fluid-filled distended stomach. There is fluid distending the distal esophagus. Small bowel is dilated and fluid-filled to the level of umbilical hernia. The exiting small bowel is completely decompressed. The hernia neck spans 13 mm, measured on series 2, image 56. There is minimal mesenteric edema. No bowel pneumatosis. Small bowel distal to the hernia is decompressed. Ileal colonic anastomosis in the right upper quadrant there is small volume of colonic stool. Distal descending and sigmoid colonic diverticulosis without diverticulitis. Vascular/Lymphatic: Aortic atherosclerosis. No aortic aneurysm. No portal venous or mesenteric gas. No bulky abdominopelvic adenopathy. Reproductive: Hysterectomy.  No adnexal mass.  Other: No ascites or free air. Umbilical hernia containing a short segment of small bowel with associated small bowel obstruction. There is skin and soft tissue thickening of the left anterior abdominal wall, typical of medication injection site. Small fat containing inguinal hernias. Musculoskeletal: Stable from earlier today. IMPRESSION: 1. Persistent and unchanged small-bowel obstruction secondary to umbilical hernia. No evidence of perforation. 2. Status post cystectomy with urinary diversion. Unchanged 15 mm stone in the ureteral confluence. 3. Colonic diverticulosis without diverticulitis. Aortic Atherosclerosis (ICD10-I70.0). Electronically  Signed   By: Keith Rake M.D.   On: 02/06/2021 18:01   CT ABDOMEN PELVIS WO CONTRAST  Result Date: 02/06/2021 CLINICAL DATA:  Nausea, diarrhea and vomiting. Diverticulitis suspected. History of interstitial cystitis and status post cystectomy with urinary diversion. EXAM: CT ABDOMEN AND PELVIS WITHOUT CONTRAST TECHNIQUE: Multidetector CT imaging of the abdomen and pelvis was performed following the standard protocol without IV contrast. COMPARISON:  06/24/2015 and 12/28/2014 FINDINGS: Lower chest: Chronic volume loss or scarring in the medial right lung base. Otherwise, lung bases are clear. No pleural effusions. Hepatobiliary: Cholecystectomy.  Normal appearance of liver. Pancreas: Unremarkable. No pancreatic ductal dilatation or surrounding inflammatory changes. Spleen: Normal in size without focal abnormality. Adrenals/Urinary Tract: Normal appearance of the adrenal glands. Kidneys are slightly small for size. No hydronephrosis or ureter stones. Status post cystectomy with urinary diversion or ostomy in the right lower quadrant. Ileal conduit is not distended but there is a large stone within the conduit that measures 1.5 cm and this is new. No dilatation of the ureters. Stomach/Bowel: Stomach is distended with air-fluid level. Dilated loops of proximal small  bowel containing fluid. Transition point appears to be related to a supraumbilical ventral hernia. The small bowel distal to the hernia is decompressed. Evidence for an ileocolic anastomosis in the right abdomen. No colonic dilatation. Multiple colonic diverticula particularly in the sigmoid colon. No colonic inflammation. Vascular/Lymphatic: Atherosclerotic calcifications in the abdominal aorta and iliac arteries without aneurysm. No significant lymph node enlargement in the abdomen or pelvis. Reproductive: Status post hysterectomy. No adnexal masses. Other: Negative for free fluid. Negative for free air. Small left inguinal hernia containing fat. Musculoskeletal: Degenerative facet arthropathy in the lower lumbar spine. Chronic areas of sclerosis in the lower thoracic spine. Chronic skin and superficial soft tissue thickening in the left anterior abdomen. IMPRESSION: 1. Small bowel obstruction secondary to an incarcerated ventral hernia. Distended stomach and small bowel loops with a transition point associated with a ventral hernia. No evidence for free fluid. 2. Postsurgical changes associated with a cystectomy and ileal conduit. Negative for hydronephrosis. However, there is a large stone within the iliac conduit measuring up to 1.5 cm. 3.  Aortic Atherosclerosis (ICD10-I70.0). 4. Colonic diverticulosis but no evidence for acute diverticulitis. These results were called by telephone at the time of interpretation on 02/06/2021 at 3:04 pm to provider Sherwood Gambler , who verbally acknowledged these results. Electronically Signed   By: Markus Daft M.D.   On: 02/06/2021 15:05   CT HEAD WO CONTRAST (5MM)  Addendum Date: 02/18/2021   ADDENDUM REPORT: 02/18/2021 20:43 ADDENDUM: These results were called by telephone at the time of interpretation on 02/18/2021 at 8:42 pm to provider Lovey Newcomer, who verbally acknowledged these results. Electronically Signed   By: Valentino Saxon M.D.   On: 02/18/2021 20:43    Result Date: 02/18/2021 CLINICAL DATA:  Mental status change, unknown cause EXAM: CT HEAD WITHOUT CONTRAST TECHNIQUE: Contiguous axial images were obtained from the base of the skull through the vertex without intravenous contrast. COMPARISON:  None. FINDINGS: Brain: There is hypodensity involving the RIGHT posterior parieto-occipital lobe cortex (series 2, image 19). No acute hemorrhage. No midline shift. Vascular: Vascular calcifications involving the carotid siphons. Skull: Normal. Negative for fracture or focal lesion. Sinuses/Orbits: No acute finding. Other: None. IMPRESSION: 1. Hypodensity involving the RIGHT posterior parietooccipital lobe cortex is concerning for infarction. Recommend further dedicated evaluation with brain MRI. PRA system was activated on 02/18/2021 at 8:09 p.m. at time of interpretation. Addendum  will be issued upon telephonic communication of results. Electronically Signed: By: Valentino Saxon M.D. On: 02/18/2021 20:15   MR ANGIO HEAD WO CONTRAST  Result Date: 02/20/2021 CLINICAL DATA:  Stroke follow-up EXAM: MRA HEAD WITHOUT CONTRAST TECHNIQUE: Angiographic images of the Circle of Willis were acquired using MRA technique without intravenous contrast. COMPARISON:  Two days ago FINDINGS: Anterior circulation: Luminal irregularity of the carotid siphons with luminal narrowing at the anterior genu on the right that is moderate and likely accentuated by flow artifact and mild motion. Mild atheromatous irregularity of MCA branches. Negative for aneurysm or beading. Posterior circulation: Left dominant. No branch occlusion, beading, or aneurysm. IMPRESSION: 1. No emergent finding. 2. Irregularity of the carotid siphons attributed to atherosclerosis with up to moderate narrowing on the right. Electronically Signed   By: Jorje Guild M.D.   On: 02/20/2021 11:46   MR BRAIN WO CONTRAST  Result Date: 02/18/2021 CLINICAL DATA:  Follow-up examination for acute stroke. EXAM: MRI  HEAD WITHOUT CONTRAST TECHNIQUE: Multiplanar, multiecho pulse sequences of the brain and surrounding structures were obtained without intravenous contrast. COMPARISON:  Prior head CT from earlier the same day. FINDINGS: Brain: Cerebral volume within normal limits for age. Patchy T2/FLAIR hyperintensity involving the periventricular deep white matter both cerebral hemispheres as well as the pons, most consistent with chronic small vessel ischemic disease, mild to moderate in nature. Evolving right PCA territory infarct involving the parasagittal right occipital lobe is seen, late subacute in appearance with only mild residual diffusion abnormality seen. Associated petechial blood products without frank intraparenchymal hematoma or significant mass effect. Additional 7 mm acute ischemic cortical infarct seen involving the high right parietal lobe, postcentral gyrus (series 11, image 90). No associated edema or hemorrhage. Additional subtle 3 mm focus of diffusion abnormality noted at the high parasagittal left frontal lobe, likely an evolving subacute infarct (series 11, image 94). No associated hemorrhage or mass effect. No other evidence for acute or subacute ischemia. No other acute intracranial hemorrhage. No mass lesion, mass effect or midline shift. No hydrocephalus or extra-axial fluid collection. Pituitary gland suprasellar region within normal limits. Midline structures intact. Vascular: Major intracranial vascular flow voids are maintained. Skull and upper cervical spine: Of craniocervical junction within normal limits. Bone marrow signal intensity normal. No scalp soft tissue abnormality. Sinuses/Orbits: Globes and orbital soft tissues demonstrate no acute finding. Paranasal sinuses are largely clear. No mastoid effusion. Inner ear structures grossly normal. Other: None. IMPRESSION: 1. 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 2. Evolving  late subacute right PCA territory infarct involving the parasagittal right occipital lobe. Associated petechial blood products without frank intraparenchymal hematoma or significant mass effect. 3. Additional 3 mm focus of diffusion abnormality involving the high parasagittal left frontal lobe, likely an evolving subacute infarct. No associated hemorrhage or mass effect. 4. Underlying mild to moderate chronic microvascular ischemic disease. Electronically Signed   By: Jeannine Boga M.D.   On: 02/18/2021 23:25   CT ABDOMEN PELVIS W CONTRAST  Result Date: 02/16/2021 CLINICAL DATA:  Abdominal abscess, infection suspected EXAM: CT ABDOMEN AND PELVIS WITH CONTRAST TECHNIQUE: Multidetector CT imaging of the abdomen and pelvis was performed using the standard protocol following bolus administration of intravenous contrast. CONTRAST:  76mL OMNIPAQUE IOHEXOL 350 MG/ML SOLN COMPARISON:  CT abdomen and pelvis 02/10/2021 FINDINGS: Lower chest: Elevated right hemidiaphragm and subsegmental atelectatic changes in the right lung base. Hepatobiliary: No focal liver abnormality is seen. Status post cholecystectomy. No  biliary dilatation. Pancreas: Unremarkable. No pancreatic ductal dilatation or surrounding inflammatory changes. Spleen: Normal in size without focal abnormality. Adrenals/Urinary Tract: Bilateral adrenal thickening and an 11 mm nodular density in the left adrenal gland, unchanged. Kidneys appear stable and within normal limits. Urinary bladder is surgically absent. 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 Associated right abdominal urinary ostomy. Stomach/Bowel: No bowel obstruction, free air or pneumatosis identified. Colonic diverticulosis without evidence of acute diverticulitis. Mild wall thickening of loops of small bowel in the anterior mid abdomen adjacent to previous surgical changes.  Vascular/Lymphatic: Aortic atherosclerosis. No enlarged abdominal or pelvic lymph nodes. Reproductive: Status post hysterectomy. No adnexal masses. Other: Redemonstration of surgical changes from recent umbilical hernia repair. Interval placement of a percutaneous drainage catheter in the anterior abdomen with essentially complete resolution of the previous fluid collection. A few small foci of residual air identified in the region. Similar appearing mild fat stranding about the umbilicus no new fluid collection identified. No frank ascites. Musculoskeletal: No suspicious bony lesions identified. IMPRESSION: 1. Recent umbilical hernia surgical repair changes again seen. Interval resolution of the previous fluid collection in the anterior abdomen status post percutaneous drainage catheter placement. A few small foci of residual air in the region. Stable appearing soft tissue fat stranding about the umbilicus with no new collection identified. 2. Previous cystectomy and urinary diversion changes, unchanged. 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 3. Colonic diverticulosis. 4. Other findings as described. Electronically Signed   By: Ofilia Neas M.D.   On: 02/16/2021 13:57   CT ABDOMEN PELVIS W CONTRAST  Result Date: 02/10/2021 CLINICAL DATA:  Postoperative umbilical hernia repair. Abdominal pain and fever with leukocytosis. EXAM: CT ABDOMEN AND PELVIS WITH CONTRAST TECHNIQUE: Multidetector CT imaging of the abdomen and pelvis was performed using the standard protocol following bolus administration of intravenous contrast. CONTRAST:  85mL OMNIPAQUE IOHEXOL 350 MG/ML SOLN COMPARISON:  CT abdomen and pelvis 02/06/2021 FINDINGS: Lower chest: There is minimal atelectasis in the right lung base. Hepatobiliary: No focal liver abnormality is seen. Status post cholecystectomy. No biliary dilatation. Pancreas: Unremarkable. No pancreatic ductal dilatation or surrounding inflammatory changes. Spleen: Normal in size  without focal abnormality. Adrenals/Urinary Tract: There is bilateral adrenal thickening similar to the prior examination. The bilateral kidneys are within normal limits. Patient is status post cystectomy. There is a right lower quadrant urinary diversion which is distended with fluid. There is a stable 14 mm calcification within the urinary diversion. Stomach/Bowel: Partial right colectomy changes are again seen. There is sigmoid colon diverticulosis without evidence for diverticulitis. There are mildly dilated loops of small bowel proximally, but distal small bowel loops appear nondilated. Oral contrast is seen to reach distal small bowel. Patient is status post umbilical hernia repair. There is an abnormal loop of small bowel at the level of the umbilicus demonstrating wall thickening with a portion of this bowel approximating the anterior abdominal wall (images 2/48 through 54). There is no pneumatosis. There is mesenteric edema and fluid adjacent to small bowel loops at the surgical level. Nasogastric tube tip is in the distal stomach. The stomach is nondilated. Vascular/Lymphatic: Aortic atherosclerosis. No enlarged abdominal or pelvic lymph nodes. Reproductive: Status post hysterectomy. No adnexal masses. Other: Patient is status post umbilical hernia repair. There is no evidence for recurrent hernia. There is a small amount of subcutaneous fluid and air at the level of the umbilicus compatible with  recent surgery. There is also some skin thickening and edema of the left anterior abdominal wall, nonspecific. Subcutaneous air in the left abdominal wall may be related to medication injection sites. There are intraperitoneal fluid pockets containing a small amount of air approximating small-bowel loops in the anterior abdominal wall. The largest pocket is seen along the anterior and midline abdominal wall at the level of the umbilicus measuring 11 x 21 by 10 cm. There is no definitive wall enhancement. There  also a few small foci of free intraperitoneal air in the anterior upper abdomen. Musculoskeletal: No acute or significant osseous findings. IMPRESSION: 1. Status post umbilical hernia repair. There is a small amount of free air in the upper abdomen. 2. Short segment small bowel wall thickening with associated mesenteric edema in the anterior abdomen at the surgical level worrisome for nonspecific enteritis. 3. Intraperitoneal fluid collection containing air along the anterior abdominal wall approximating inflamed small bowel loops. Differential diagnosis includes abscess or other postoperative fluid collection. Small bowel perforation or fistula cannot be excluded given that there are inflamed small bowel loops at this level. 4. Nasogastric tube tip in the distal stomach. 5. Urinary diversion appears uncomplicated. No hydronephrosis. Stable calculus in the urinary diversion. 6. Sigmoid colon diverticulosis. 7.  Aortic Atherosclerosis (ICD10-I70.0). Electronically Signed   By: Ronney Asters M.D.   On: 02/10/2021 15:25   IR Sinus/Fist Tube Chk-Non GI  Result Date: 02/23/2021 CLINICAL DATA:  65 year old female with history of recent abdominal wall hernia repair complicated postoperatively by anterior abdominal wall fluid collection status post percutaneous drain placement on 02/11/2021. There has been trace output from the drain over the past several days and recent repeat CT demonstrates no significant residual fluid collection. EXAM: SINUS TRACT INJECTION/FISTULOGRAM COMPARISON:  02/16/2021, 02/11/2021, 02/10/2021 CONTRAST:  5 mL-administered via the existing percutaneous drain. FLUOROSCOPY TIME:  14 seconds, 4 mGy TECHNIQUE: The patient was positioned supine on the fluoroscopy table. A preprocedural spot fluoroscopic image was obtained of the right mid abdomen and the existing percutaneous drainage catheter. Multiple spot fluoroscopic and radiographic images were obtained following the injection of a small  amount of contrast via the existing percutaneous drainage catheter. The external portion of the drain was cut to release the pigtail and the drain was removed without complication. Sterile bandage was applied. FINDINGS: Trace residual fluid collection without evidence of enteric fistula. IMPRESSION: Trace residual fluid collection without evidence of enteric fistula. The drain was removed successfully. Ruthann Cancer, MD [facility] Rad[facility] Electronically Signed   By: Ruthann Cancer M.D.   On: 02/23/2021 11:18   DG Chest Port 1 View  Result Date: 02/18/2021 CLINICAL DATA:  Altered mental status, abdominal pain EXAM: PORTABLE CHEST 1 VIEW COMPARISON:  Radiograph 02/15/2017 FINDINGS: Unchanged cardiomediastinal silhouette. There is no focal airspace consolidation. There is no large pleural effusion or visible pneumothorax. There is no acute osseous abnormality. IMPRESSION: No evidence of acute cardiopulmonary disease. Electronically Signed   By: Maurine Simmering M.D.   On: 02/18/2021 12:11   DG Abd Portable 1V  Result Date: 02/18/2021 CLINICAL DATA:  Abdominal pain EXAM: PORTABLE ABDOMEN - 1 VIEW COMPARISON:  CT abdomen and pelvis dated February 16, 2021 FINDINGS: No gas-filled dilated loops of bowel. No evidence of free air, although supine position limits evaluation. Large rounded density is seen in the lower abdomen, measuring 21.4 x 18.0 cm, which displaces multiple loops of bowel. Surgical drain noted in the right hemiabdomen. IMPRESSION: Large rounded density is seen  in the lower abdomen, likely due to distended urinary conduit. Correlate with urine output. Electronically Signed   By: Yetta Glassman M.D.   On: 02/18/2021 12:22   DG Abd Portable 1V  Result Date: 02/07/2021 CLINICAL DATA:  NG tube placement. EXAM: PORTABLE ABDOMEN - 1 VIEW COMPARISON:  CT abdomen and pelvis 02/06/2021. FINDINGS: Nasogastric tube tip proximal stomach. Side hole is at the  level of the gastroesophageal junction. Distended small bowel loops are again noted. There are surgical clips and sutures in the right abdomen. Visualized lung bases are clear. IMPRESSION: 1. Enteric tube tip is in the proximal stomach with side hole at the level of the gastroesophageal junction. Recommend advancing tube. 2. Stable dilated small bowel concerning for small obstruction. Electronically Signed   By: Ronney Asters M.D.   On: 02/07/2021 19:44   ECHO TEE  Result Date: 02/24/2021    TRANSESOPHOGEAL ECHO REPORT   Patient Name:   Laurie [name] Date of Exam: 02/24/2021 Medical Rec #:  [id number]           Height:       64.0 in Accession #:    [id number]          Weight:       200.0 lb Date of Birth:  1955/05/04           BSA:          1.956 m Patient Age:    11 years            BP:           133/60 mmHg Patient Gender: F                   HR:           82 bpm. Exam Location:  Inpatient Procedure: Transesophageal Echo, Color Doppler and Saline Contrast Bubble Study Indications:     Stroke  History:         Patient has prior history of Echocardiogram examinations, most                  recent 02/19/2021. CAD; Risk Factors:Hypertension, Diabetes and                  Dyslipidemia.  Sonographer:     Darlina Sicilian RDCS Referring Phys:  [id number] RHONDA G BARRETT Diagnosing Phys: Kirk Ruths MD PROCEDURE: After discussion of the risks and benefits of a TEE, an informed consent was obtained from the patient. TEE procedure time was 5 minutes. The transesophogeal probe was passed without difficulty through the esophogus of the patient. Imaged were  obtained with the patient in a left lateral decubitus position. Local oropharyngeal anesthetic was provided with Cetacaine. Sedation performed by different physician. The patient was monitored while under deep sedation. Anesthestetic sedation was provided intravenously by Anesthesiology: 192.97mg  of Propofol, 50mg  of Lidocaine. Image quality was good. The patient's  vital signs; including heart rate, blood pressure, and oxygen saturation; remained stable throughout the procedure. The patient developed no complications during the procedure. IMPRESSIONS  1. Left ventricular ejection fraction, by estimation, is 55 to 60%. The left ventricle has normal function. The left ventricle has no regional wall motion abnormalities.  2. Right ventricular systolic function is normal. The right ventricular size is normal.  3. No left atrial/left atrial appendage thrombus was detected.  4. The mitral valve is normal in structure. Trivial mitral valve regurgitation.  5. The aortic valve is tricuspid. Aortic valve regurgitation is  not visualized.  6. There is mild (Grade II) plaque involving the descending aorta.  7. Agitated saline contrast bubble study was negative, with no evidence of any interatrial shunt. FINDINGS  Left Ventricle: Left ventricular ejection fraction, by estimation, is 55 to 60%. The left ventricle has normal function. The left ventricle has no regional wall motion abnormalities. The left ventricular internal cavity size was normal in size. Right Ventricle: The right ventricular size is normal. Right ventricular systolic function is normal. Left Atrium: Left atrial size was normal in size. No left atrial/left atrial appendage thrombus was detected. Right Atrium: Right atrial size was normal in size. Pericardium: There is no evidence of pericardial effusion. Mitral Valve: The mitral valve is normal in structure. Trivial mitral valve regurgitation. Tricuspid Valve: The tricuspid valve is normal in structure. Tricuspid valve regurgitation is not demonstrated. Aortic Valve: The aortic valve is tricuspid. Aortic valve regurgitation is not visualized. Pulmonic Valve: The pulmonic valve was normal in structure. Pulmonic valve regurgitation is trivial. Aorta: The aortic root is normal in size and structure. There is mild (Grade II) plaque involving the descending aorta. IAS/Shunts: No  atrial level shunt detected by color flow Doppler. Agitated saline contrast was given intravenously to evaluate for intracardiac shunting. Agitated saline contrast bubble study was negative, with no evidence of any interatrial shunt.   AORTA Ao Root diam: 2.90 cm Ao Asc diam:  2.70 cm Kirk Ruths MD Electronically signed by Kirk Ruths MD Signature Date/Time: 02/24/2021/2:44:01 PM    Final    ECHOCARDIOGRAM COMPLETE BUBBLE STUDY  Result Date: 02/19/2021    ECHOCARDIOGRAM REPORT   Patient Name:   Laurie [name] Date of Exam: 02/19/2021 Medical Rec #:  [id number]           Height:       64.0 in Accession #:    [id number]          Weight:       200.0 lb Date of Birth:  1955/10/07           BSA:          1.956 m Patient Age:    29 years            BP:           176/99 mmHg Patient Gender: F                   HR:           64 bpm. Exam Location:  Inpatient Procedure: 2D Echo, Cardiac Doppler, Color Doppler and Saline Contrast Bubble            Study Indications:    Stroke, bubble  History:        Patient has no prior history of Echocardiogram examinations.                 CAD; Risk Factors:Hypertension and Diabetes.  Sonographer:    Glo Herring Referring Phys: [id number] [place]  1. Left ventricular ejection fraction, by estimation, is 65 to 70%. The left ventricle has normal function. The left ventricle has no regional wall motion abnormalities. Left ventricular diastolic parameters are consistent with Grade I diastolic dysfunction (impaired relaxation).  2. Right ventricular systolic function is normal. The right ventricular size is normal.  3. The mitral valve is grossly normal. Trivial mitral valve regurgitation.  4. The aortic valve was not well visualized. Aortic valve regurgitation is not visualized. Aortic valve mean gradient measures 6.0  mmHg.  5. Agitated saline contrast bubble study was negative, with no evidence of any interatrial shunt. Comparison(s): No prior  Echocardiogram. FINDINGS  Left Ventricle: Left ventricular ejection fraction, by estimation, is 65 to 70%. The left ventricle has normal function. The left ventricle has no regional wall motion abnormalities. The left ventricular internal cavity size was normal in size. There is  no left ventricular hypertrophy. Left ventricular diastolic parameters are consistent with Grade I diastolic dysfunction (impaired relaxation). Indeterminate filling pressures. Right Ventricle: The right ventricular size is normal. No increase in right ventricular wall thickness. Right ventricular systolic function is normal. Left Atrium: Left atrial size was normal in size. Right Atrium: Right atrial size was normal in size. Pericardium: There is no evidence of pericardial effusion. Mitral Valve: The mitral valve is grossly normal. Trivial mitral valve regurgitation. Tricuspid Valve: The tricuspid valve is grossly normal. Tricuspid valve regurgitation is trivial. Aortic Valve: The aortic valve was not well visualized. Aortic valve regurgitation is not visualized. Aortic valve mean gradient measures 6.0 mmHg. Aortic valve peak gradient measures 10.9 mmHg. Aortic valve area, by VTI measures 1.96 cm. Pulmonic Valve: The pulmonic valve was normal in structure. Pulmonic valve regurgitation is not visualized. Aorta: The aortic root and ascending aorta are structurally normal, with no evidence of dilitation. IAS/Shunts: No atrial level shunt detected by color flow Doppler. Agitated saline contrast was given intravenously to evaluate for intracardiac shunting. Agitated saline contrast bubble study was negative, with no evidence of any interatrial shunt.  LEFT VENTRICLE PLAX 2D LVIDd:         4.00 cm   Diastology LVIDs:         2.50 cm   LV e' medial:    7.40 cm/s LV PW:         1.00 cm   LV E/e' medial:  8.9 LV IVS:        1.00 cm   LV e' lateral:   9.46 cm/s LVOT diam:     1.90 cm   LV E/e' lateral: 7.0 LV SV:         73 LV SV Index:   37 LVOT  Area:     2.84 cm  RIGHT VENTRICLE             IVC RV Basal diam:  3.25 cm     IVC diam: 1.60 cm RV S prime:     15.80 cm/s LEFT ATRIUM             Index        RIGHT ATRIUM           Index LA diam:        3.60 cm 1.84 cm/m   RA Area:     11.70 cm LA Vol (A2C):   48.8 ml 24.94 ml/m  RA Volume:   24.40 ml  12.47 ml/m LA Vol (A4C):   45.1 ml 23.05 ml/m LA Biplane Vol: 49.2 ml 25.15 ml/m  AORTIC VALVE                     PULMONIC VALVE AV Area (Vmax):    2.20 cm      PV Vmax:       1.18 m/s AV Area (Vmean):   1.95 cm      PV Peak grad:  5.6 mmHg AV Area (VTI):     1.96 cm AV Vmax:           165.00 cm/s AV Vmean:  111.000 cm/s AV VTI:            0.373 m AV Peak Grad:      10.9 mmHg AV Mean Grad:      6.0 mmHg LVOT Vmax:         128.00 cm/s LVOT Vmean:        76.200 cm/s LVOT VTI:          0.258 m LVOT/AV VTI ratio: 0.69  AORTA Ao Root diam: 2.90 cm MITRAL VALVE MV Area (PHT): 3.48 cm    SHUNTS MV Decel Time: 218 msec    Systemic VTI:  0.26 m MV E velocity: 66.00 cm/s  Systemic Diam: 1.90 cm MV A velocity: 96.80 cm/s MV E/A ratio:  0.68 Lyman Bishop MD Electronically signed by Lyman Bishop MD Signature Date/Time: 02/19/2021/1:28:02 PM    Final    CT IMAGE GUIDED DRAINAGE BY PERCUTANEOUS CATHETER  Result Date: 02/11/2021 CLINICAL DATA:  Status post repair of incarcerated ventral hernia with development of postoperative fluid collection of the anterior peritoneal cavity immediately deep to the abdominal wall. EXAM: CT GUIDED CATHETER DRAINAGE OF PERITONEAL ABSCESS ANESTHESIA/SEDATION: Moderate (conscious) sedation was employed during this procedure. A total of Versed 1.5 mg and Fentanyl 75 mcg was administered intravenously by radiology nursing under my supervision. Moderate Sedation Time: 42 minutes. The patient's level of consciousness and vital signs were monitored continuously by radiology nursing throughout the procedure under my direct supervision. PROCEDURE: The procedure, risks, benefits,  and alternatives were explained to the patient. Questions regarding the procedure were encouraged and answered. The patient understands and consents to the procedure. A time out was performed prior to initiating the procedure. The abdominal wall was prepped with chlorhexidine in a sterile fashion, and a sterile drape was applied covering the operative field. A sterile gown and sterile gloves were used for the procedure. Local anesthesia was provided with 1% Lidocaine. Under CT guidance, an 18 gauge trocar needle was advanced into an anterior peritoneal fluid collection from a right-sided anterior approach. After confirming needle tip position and aspiration of fluid via the needle, a guidewire was advanced and the needle removed. The tract was dilated and a 10 French percutaneous drainage catheter placed. The catheter was formed and attached to a suction bulb. Additional CT was performed. A fluid sample was sent for culture analysis. The catheter was secured at the skin with a Prolene retention suture and adhesive StatLock device. COMPLICATIONS: None FINDINGS: Aspiration at the level of the anterior peritoneal fluid collection just to the right of midline yielded purulent fluid. There was good return of fluid after drain placement. IMPRESSION: CT-guided percutaneous catheter drainage of anterior peritoneal abscess yielding purulent fluid. A fluid sample was sent for culture analysis. A 10 French drain was placed and attached to suction bulb drainage. Electronically Signed   By: Aletta Edouard M.D.   On: 02/11/2021 14:18   VAS US CAROTID  Result Date: 02/22/2021 Carotid Arterial Duplex Study Patient Name:  Laurie [name]  Date of Exam:   02/21/2021 Medical Rec #: [id number]            Accession #:    [id number] Date of Birth: 1955/08/29            Patient Gender: F Patient Age:   82 years Exam Location:  [facility] Procedure:      VAS US CAROTID Referring Phys: MCNEILL KIRKPATRICK  --------------------------------------------------------------------------------  Indications:       CVA. Risk Factors:  Hypertension. Limitations        Today's exam was limited due to the patient's respiratory                    variation and patient positioning. Comparison Study:  No prior studies. Performing Technologist: Oliver Hum RVT  Examination Guidelines: A complete evaluation includes B-mode imaging, spectral Doppler, color Doppler, and power Doppler as needed of all accessible portions of each vessel. Bilateral testing is considered an integral part of a complete examination. Limited examinations for reoccurring indications may be performed as noted.  Right Carotid Findings: +----------+--------+--------+--------+-----------------------+--------+           PSV cm/sEDV cm/sStenosisPlaque Description     Comments +----------+--------+--------+--------+-----------------------+--------+ CCA Prox  88      9               smooth and heterogenoustortuous +----------+--------+--------+--------+-----------------------+--------+ CCA Distal45      7               smooth and heterogenous         +----------+--------+--------+--------+-----------------------+--------+ ICA Prox  39      8               smooth and heterogenous         +----------+--------+--------+--------+-----------------------+--------+ ICA Distal49      12                                     tortuous +----------+--------+--------+--------+-----------------------+--------+ ECA       73      6                                               +----------+--------+--------+--------+-----------------------+--------+ +----------+--------+-------+--------+-------------------+           PSV cm/sEDV cmsDescribeArm Pressure (mmHG) +----------+--------+-------+--------+-------------------+ Subclavian115                                        +----------+--------+-------+--------+-------------------+  +---------+--------+--+--------+-+---------+ VertebralPSV cm/s48EDV cm/s7Antegrade +---------+--------+--+--------+-+---------+  Left Carotid Findings: +----------+--------+--------+--------+-----------------------+--------+           PSV cm/sEDV cm/sStenosisPlaque Description     Comments +----------+--------+--------+--------+-----------------------+--------+ CCA Prox  92      14              smooth and heterogenous         +----------+--------+--------+--------+-----------------------+--------+ CCA Distal47      7               smooth and heterogenous         +----------+--------+--------+--------+-----------------------+--------+ ICA Prox  37      8               smooth and heterogenous         +----------+--------+--------+--------+-----------------------+--------+ ICA Distal49      8                                      tortuous +----------+--------+--------+--------+-----------------------+--------+ ECA       53      6                                               +----------+--------+--------+--------+-----------------------+--------+ +----------+--------+--------+--------+-------------------+  PSV cm/sEDV cm/sDescribeArm Pressure (mmHG) +----------+--------+--------+--------+-------------------+ [id number]                                         +----------+--------+--------+--------+-------------------+ +---------+--------+--+--------+-+---------+ VertebralPSV cm/s44EDV cm/s8Antegrade +---------+--------+--+--------+-+---------+   Summary: Right Carotid: Velocities in the right ICA are consistent with a 1-39% stenosis. Left Carotid: Velocities in the left ICA are consistent with a 1-39% stenosis. Vertebrals: Bilateral vertebral arteries demonstrate antegrade flow. *See table(s) above for measurements and observations.  Electronically signed by Delia Heady MD on 02/22/2021 at 8:22:23 AM.    Final     Subjective: Eager to go  home  Discharge Exam: Vitals:   02/27/21 1102 02/27/21 1339  BP: 123/67 (!) 143/62  Pulse: 63 (!) 52  Resp:  19  Temp:  98 F (36.7 C)  SpO2:  100%   Vitals:   02/27/21 0624 02/27/21 0836 02/27/21 1102 02/27/21 1339  BP: (!) 144/70  123/67 (!) 143/62  Pulse: 60  63 (!) 52  Resp: 18   19  Temp: 97.9 F (36.6 C)   98 F (36.7 C)  TempSrc: Oral     SpO2: 100% 98%  100%  Weight:      Height:        General: Pt is alert, awake, not in acute distress Cardiovascular: RRR, S1/S2 + Respiratory: CTA bilaterally, no wheezing, no rhonchi Abdominal: Soft, NT, ND, bowel sounds + Extremities: no edema, no cyanosis   The results of significant diagnostics from this hospitalization (including imaging, microbiology, ancillary and laboratory) are listed below for reference.     Microbiology: Recent Results (from the past 240 hour(s))  Culture, blood (routine x 2)     Status: None   Collection Time: 02/18/21 12:04 PM   Specimen: BLOOD  Result Value Ref Range Status   Specimen Description   Final    BLOOD RIGHT ANTECUBITAL Performed at [facility], [address], [place], [place] [address]    Special Requests   Final    BOTTLES DRAWN AEROBIC ONLY Blood Culture adequate volume Performed at [facility], [address] [place], [place] [address]    Culture   Final    NO GROWTH 5 DAYS Performed at [facility], [address], [place], [place] [address]    Report Status 02/23/2021 FINAL  Final  Culture, blood (routine x 2)     Status: None   Collection Time: 02/18/21 12:04 PM   Specimen: BLOOD LEFT HAND  Result Value Ref Range Status   Specimen Description   Final    BLOOD LEFT HAND Performed at [facility], [address], [place], [place] [address]    Special Requests   Final    BOTTLES DRAWN AEROBIC ONLY Blood Culture results may not be optimal due to an inadequate volume of blood received in culture  bottles Performed at [facility], [address], [place], [place] [address]    Culture   Final    NO GROWTH 5 DAYS Performed at [facility], [address], [place], [place] [address]    Report Status 02/23/2021 FINAL  Final  Urine Culture     Status: Abnormal   Collection Time: 02/18/21 12:48 PM   Specimen: In/Out Cath Urine  Result Value Ref Range Status   Specimen Description   Final    IN/OUT CATH URINE Performed  at [facility], [place] [address], [place], [place] [address]    Special Requests   Final    NONE Performed at [facility], [place] [address], [place], [place] [address]    Culture (A)  Final    40,000 COLONIES/mL KLEBSIELLA PNEUMONIAE >=100,000 COLONIES/mL ESCHERICHIA COLI    Report Status 02/21/2021 FINAL  Final   Organism ID, Bacteria KLEBSIELLA PNEUMONIAE (A)  Final   Organism ID, Bacteria ESCHERICHIA COLI (A)  Final      Susceptibility   Escherichia coli - MIC*    AMPICILLIN 8 SENSITIVE Sensitive     CEFAZOLIN <=4 SENSITIVE Sensitive     CEFEPIME <=0.12 SENSITIVE Sensitive     CEFTRIAXONE <=0.25 SENSITIVE Sensitive     CIPROFLOXACIN <=0.25 SENSITIVE Sensitive     GENTAMICIN <=1 SENSITIVE Sensitive     IMIPENEM <=0.25 SENSITIVE Sensitive     NITROFURANTOIN <=16 SENSITIVE Sensitive     TRIMETH/SULFA <=20 SENSITIVE Sensitive     AMPICILLIN/SULBACTAM <=2 SENSITIVE Sensitive     PIP/TAZO <=4 SENSITIVE Sensitive     * >=100,000 COLONIES/mL ESCHERICHIA COLI   Klebsiella pneumoniae - MIC*    AMPICILLIN >=32 RESISTANT Resistant     CEFAZOLIN <=4 SENSITIVE Sensitive     CEFEPIME <=0.12 SENSITIVE Sensitive     CEFTRIAXONE <=0.25 SENSITIVE Sensitive     CIPROFLOXACIN <=0.25 SENSITIVE Sensitive     GENTAMICIN <=1 SENSITIVE Sensitive     IMIPENEM <=0.25 SENSITIVE Sensitive     NITROFURANTOIN 64 INTERMEDIATE Intermediate     TRIMETH/SULFA <=20 SENSITIVE Sensitive     AMPICILLIN/SULBACTAM 8 SENSITIVE  Sensitive     PIP/TAZO <=4 SENSITIVE Sensitive     * 40,000 COLONIES/mL KLEBSIELLA PNEUMONIAE     Labs: BNP (last 3 results) No results for input(s): BNP in the last 8760 hours. Basic Metabolic Panel: Recent Labs  Lab 02/21/21 0502 02/22/21 0517 02/23/21 0446 02/24/21 0438 02/25/21 0530 02/26/21 0540 02/27/21 0441  NA 143   < > 140 143 141 140 139  K 3.1*   < > 3.6 3.9 3.3* 3.8 3.3*  CL 117*   < > 115* 115* 116* 117* 115*  CO2 16*   < > 15* 18* 16* 15* 16*  GLUCOSE 135*   < > 133* 163* 159* 141* 150*  BUN 12   < > 12 15 17 18 15   CREATININE 0.91   < > 1.11* 1.14* 1.17* 1.22* 1.08*  CALCIUM 9.1   < > 9.2 9.7 9.2 9.1 9.0  MG 1.8  --   --   --   --   --   --    < > = values in this interval not displayed.   Liver Function Tests: Recent Labs  Lab 02/23/21 0446 02/24/21 0438 02/25/21 0530 02/26/21 0540 02/27/21 0441  AST 37 31 32 27 19  ALT 56* 48* 43 35 28  ALKPHOS 76 78 80 78 75  BILITOT 0.4 0.3 0.7 0.5 0.4  PROT 7.2 7.3 7.2 6.9 6.7  ALBUMIN 3.0* 3.0* 3.2* 3.1* 2.9*   No results for input(s): LIPASE, AMYLASE in the last 168 hours. No results for input(s): AMMONIA in the last 168 hours. CBC: Recent Labs  Lab 02/23/21 0446 02/24/21 0438 02/25/21 0530 02/26/21 0540 02/27/21 0441  WBC 9.4 7.4 7.3 9.1 8.9  NEUTROABS 6.2 5.1 4.4 6.1 5.5  HGB 12.0 11.8* 11.9* 11.9* 10.7*  HCT 36.6 36.2 37.0 36.8 32.5*  MCV 98.7 99.2 98.1 99.2 98.8  PLT 551*  636* 598* 490* 491*   Cardiac Enzymes: No results for input(s): CKTOTAL, CKMB, CKMBINDEX, TROPONINI in the last 168 hours. BNP: Invalid input(s): POCBNP CBG: Recent Labs  Lab 02/26/21 1126 02/26/21 1559 02/26/21 2058 02/27/21 0739 02/27/21 1226  GLUCAP 194* 171* 141* 134* 162*   D-Dimer No results for input(s): DDIMER in the last 72 hours. Hgb A1c No results for input(s): HGBA1C in the last 72 hours. Lipid Profile No results for input(s): CHOL, HDL, LDLCALC, TRIG, CHOLHDL, LDLDIRECT in the last 72  hours. Thyroid function studies No results for input(s): TSH, T4TOTAL, T3FREE, THYROIDAB in the last 72 hours.  Invalid input(s): FREET3 Anemia work up No results for input(s): VITAMINB12, FOLATE, FERRITIN, TIBC, IRON, RETICCTPCT in the last 72 hours. Urinalysis    Component Value Date/Time   COLORURINE YELLOW 02/18/2021 1248   APPEARANCEUR HAZY (A) 02/18/2021 1248   LABSPEC 1.021 02/18/2021 1248   PHURINE 6.0 02/18/2021 1248   GLUCOSEU NEGATIVE 02/18/2021 1248   HGBUR NEGATIVE 02/18/2021 1248   BILIRUBINUR NEGATIVE 02/18/2021 1248   KETONESUR NEGATIVE 02/18/2021 1248   PROTEINUR 30 (A) 02/18/2021 1248   UROBILINOGEN 0.2 12/28/2014 2013   NITRITE NEGATIVE 02/18/2021 1248   LEUKOCYTESUR NEGATIVE 02/18/2021 1248   Sepsis Labs Invalid input(s): PROCALCITONIN,  WBC,  LACTICIDVEN Microbiology Recent Results (from the past 240 hour(s))  Culture, blood (routine x 2)     Status: None   Collection Time: 02/18/21 12:04 PM   Specimen: BLOOD  Result Value Ref Range Status   Specimen Description   Final    BLOOD RIGHT ANTECUBITAL Performed at [facility], [place] [address] [place], [place] [address]    Special Requests   Final    BOTTLES DRAWN AEROBIC ONLY Blood Culture adequate volume Performed at [place] [address] [place], [place] [address]    Culture   Final    NO GROWTH 5 DAYS Performed at [facility], [place] [address] [place], [place] [address]    Report Status 02/23/2021 FINAL  Final  Culture, blood (routine x 2)     Status: None   Collection Time: 02/18/21 12:04 PM   Specimen: BLOOD LEFT HAND  Result Value Ref Range Status   Specimen Description   Final    BLOOD LEFT HAND Performed at [place] [address], [place], [place] [address]    Special Requests   Final    BOTTLES DRAWN AEROBIC ONLY Blood Culture results may not be optimal due to an inadequate volume of blood received in culture  bottles Performed at [facility] [address] [place], [place] [address]    Culture   Final    NO GROWTH 5 DAYS Performed at [facility], [place] [address], [place], [place] [address]    Report Status 02/23/2021 FINAL  Final  Urine Culture     Status: Abnormal   Collection Time: 02/18/21 12:48 PM   Specimen: In/Out Cath Urine  Result Value Ref Range Status   Specimen Description   Final    IN/OUT CATH URINE Performed at [place] [address] [place], [place] [address]    Special Requests   Final    NONE Performed at [facility], [place] [address] [place], [place] [address]    Culture (A)  Final    40,000 COLONIES/mL KLEBSIELLA PNEUMONIAE >=100,000 COLONIES/mL ESCHERICHIA COLI    Report Status 02/21/2021 FINAL  Final   Organism  ID, Bacteria KLEBSIELLA PNEUMONIAE (A)  Final   Organism ID, Bacteria ESCHERICHIA COLI (A)  Final      Susceptibility   Escherichia coli - MIC*    AMPICILLIN 8 SENSITIVE Sensitive     CEFAZOLIN <=4 SENSITIVE Sensitive     CEFEPIME <=0.12 SENSITIVE Sensitive     CEFTRIAXONE <=0.25 SENSITIVE Sensitive     CIPROFLOXACIN <=0.25 SENSITIVE Sensitive     GENTAMICIN <=1 SENSITIVE Sensitive     IMIPENEM <=0.25 SENSITIVE Sensitive     NITROFURANTOIN <=16 SENSITIVE Sensitive     TRIMETH/SULFA <=20 SENSITIVE Sensitive     AMPICILLIN/SULBACTAM <=2 SENSITIVE Sensitive     PIP/TAZO <=4 SENSITIVE Sensitive     * >=100,000 COLONIES/mL ESCHERICHIA COLI   Klebsiella pneumoniae - MIC*    AMPICILLIN >=32 RESISTANT Resistant     CEFAZOLIN <=4 SENSITIVE Sensitive     CEFEPIME <=0.12 SENSITIVE Sensitive     CEFTRIAXONE <=0.25 SENSITIVE Sensitive     CIPROFLOXACIN <=0.25 SENSITIVE Sensitive     GENTAMICIN <=1 SENSITIVE Sensitive     IMIPENEM <=0.25 SENSITIVE Sensitive     NITROFURANTOIN 64 INTERMEDIATE Intermediate     TRIMETH/SULFA <=20 SENSITIVE Sensitive     AMPICILLIN/SULBACTAM 8 SENSITIVE  Sensitive     PIP/TAZO <=4 SENSITIVE Sensitive     * 40,000 COLONIES/mL KLEBSIELLA PNEUMONIAE   Time spent: 30 min  SIGNED:   Marylu Lund, MD  [facility] 02/27/2021, 4:24 PM  If 7PM-7AM, please contact night-coverage

## 2021-02-27 NOTE — Progress Notes (Signed)
Urine craterization completed once during shift. output @ 1340. Wound vac changed to home wound vac at bedside with PA Brooke.

## 2021-02-27 NOTE — Progress Notes (Signed)
Urine catheterization completed twice during shift.   175 ML output @ 2334  from straight cath.    200 ML output @ 0556   from straight cath.

## 2021-02-27 NOTE — Progress Notes (Signed)
Pt discharging home. VSS. PIV removed. AVS printed and educational teaching completed with pt and pt sister at bedside.

## 2022-10-02 ENCOUNTER — Other Ambulatory Visit: Payer: Self-pay | Admitting: Student

## 2022-10-02 DIAGNOSIS — M4316 Spondylolisthesis, lumbar region: Secondary | ICD-10-CM

## 2022-10-23 ENCOUNTER — Encounter: Payer: Self-pay | Admitting: Student

## 2022-10-25 ENCOUNTER — Other Ambulatory Visit: Payer: Medicare PPO

## 2022-10-25 DIAGNOSIS — M4316 Spondylolisthesis, lumbar region: Secondary | ICD-10-CM

## 2022-12-17 ENCOUNTER — Other Ambulatory Visit: Payer: Self-pay

## 2022-12-17 ENCOUNTER — Emergency Department (HOSPITAL_BASED_OUTPATIENT_CLINIC_OR_DEPARTMENT_OTHER)
Admission: EM | Admit: 2022-12-17 | Discharge: 2022-12-17 | Disposition: A | Discharge status: Home / Self Care | Payer: Medicare PPO

## 2022-12-17 ENCOUNTER — Emergency Department (HOSPITAL_BASED_OUTPATIENT_CLINIC_OR_DEPARTMENT_OTHER): Payer: Medicare PPO

## 2022-12-17 DIAGNOSIS — Z794 Long term (current) use of insulin: Secondary | ICD-10-CM | POA: Diagnosis not present

## 2022-12-17 DIAGNOSIS — R197 Diarrhea, unspecified: Secondary | ICD-10-CM | POA: Insufficient documentation

## 2022-12-17 DIAGNOSIS — Z7982 Long term (current) use of aspirin: Secondary | ICD-10-CM | POA: Diagnosis not present

## 2022-12-17 DIAGNOSIS — Z79899 Other long term (current) drug therapy: Secondary | ICD-10-CM | POA: Insufficient documentation

## 2022-12-17 DIAGNOSIS — Z7984 Long term (current) use of oral hypoglycemic drugs: Secondary | ICD-10-CM | POA: Insufficient documentation

## 2022-12-17 DIAGNOSIS — R1030 Lower abdominal pain, unspecified: Secondary | ICD-10-CM

## 2022-12-17 LAB — URINALYSIS, MICROSCOPIC (REFLEX): WBC, UA: >50 WBC/hpf (ref 0–5)

## 2022-12-17 LAB — CBC
HCT: 37.8 % (ref 36.0–46.0)
Hemoglobin: 12.8 g/dL (ref 12.0–15.0)
MCH: 33.2 pg (ref 26.0–34.0)
MCHC: 33.9 g/dL (ref 30.0–36.0)
MCV: 97.9 fL (ref 80.0–100.0)
Platelets: 379 10*3/uL (ref 150–400)
RBC: 3.86 MIL/uL — ABNORMAL LOW (ref 3.87–5.11)
RDW: 13 % (ref 11.5–15.5)
WBC: 8.9 10*3/uL (ref 4.0–10.5)
nRBC: 0 % (ref 0.0–0.2)

## 2022-12-17 LAB — COMPREHENSIVE METABOLIC PANEL
ALT: 27 U/L (ref 0–44)
AST: 31 U/L (ref 15–41)
Albumin: 4.2 g/dL (ref 3.5–5.0)
Alkaline Phosphatase: 69 U/L (ref 38–126)
Anion gap: 11 (ref 5–15)
BUN: 14 mg/dL (ref 8–23)
CO2: 25 mmol/L (ref 22–32)
Calcium: 9.5 mg/dL (ref 8.9–10.3)
Chloride: 101 mmol/L (ref 98–111)
Creatinine, Ser: 2.1 mg/dL — ABNORMAL HIGH (ref 0.44–1.00)
GFR, Estimated: 25 mL/min — ABNORMAL LOW (ref >60)
Glucose, Bld: 284 mg/dL — ABNORMAL HIGH (ref 70–99)
Potassium: 3.3 mmol/L — ABNORMAL LOW (ref 3.5–5.1)
Sodium: 137 mmol/L (ref 135–145)
Total Bilirubin: 0.4 mg/dL (ref 0.3–1.2)
Total Protein: 7.4 g/dL (ref 6.5–8.1)

## 2022-12-17 LAB — URINALYSIS, ROUTINE W REFLEX MICROSCOPIC
Glucose, UA: NEGATIVE mg/dL
Ketones, ur: 15 mg/dL — AB
Nitrite: POSITIVE — AB
Protein, ur: >300 mg/dL — AB
Specific Gravity, Urine: 1.02 (ref 1.005–1.030)
pH: 7 (ref 5.0–8.0)

## 2022-12-17 LAB — LIPASE, BLOOD: Lipase: 16 U/L (ref 11–51)

## 2022-12-17 MED ORDER — ACETAMINOPHEN 325 MG PO TABS
650.0000 mg | ORAL_TABLET | Freq: Once | ORAL | Status: AC
  Administered 2022-12-17: 650 mg via ORAL
  Filled 2022-12-17: qty 2

## 2022-12-17 MED ORDER — ONDANSETRON 4 MG PO TBDP
4.0000 mg | ORAL_TABLET | Freq: Once | ORAL | Status: AC
  Administered 2022-12-17: 4 mg via ORAL
  Filled 2022-12-17: qty 1

## 2022-12-17 MED ORDER — ACETAMINOPHEN 325 MG PO TABS
ORAL_TABLET | ORAL | Status: AC
  Filled 2022-12-17: qty 1

## 2022-12-17 MED ORDER — ONDANSETRON HCL 4 MG PO TABS: 4.0000 mg | ORAL_TABLET | Freq: Four times a day (QID) | ORAL | 0 refills | Status: AC

## 2022-12-17 NOTE — ED Notes (Signed)
Report given to the next RN... 

## 2022-12-17 NOTE — ED Notes (Signed)
Pt verbalized understanding of d/c instructions, meds, and followup care. Denies questions. VSS, no distress noted. Steady gait to exit with all belongings.  ?

## 2022-12-17 NOTE — Discharge Instructions (Addendum)
Please return immediately if develop fevers, chills, worsening abdominal pain, inability to drink, any stop making urine, or he develop any new or worsening symptoms that are concerning to you.  As discussed, your kidney numbers are slightly elevated.  Please follow-up with your primary doctor and your urologist soon as possible.

## 2022-12-17 NOTE — ED Provider Notes (Signed)
[facility] EMERGENCY DEPARTMENT AT [facility] Provider Note   CSN: [id number] Arrival date & time: 12/17/22  1503     History  Chief Complaint  Patient presents with   Abdominal Pain    Laurie [name] is a 67 y.o. female.  Presented to the emergency department for intermittent lower abdominal pain since Saturday.  Has had some crampy sensation, intermittent diarrhea.  No nausea no vomiting.  No fevers or chills.  Still making urine out of her ostomy without issue.   Abdominal Pain      Home Medications Prior to Admission medications   Medication Sig Start Date End Date Taking? Authorizing Provider  albuterol (PROVENTIL HFA;VENTOLIN HFA) 108 (90 BASE) MCG/ACT inhaler Inhale 2 puffs into the lungs every 4 (four) hours as needed for wheezing or shortness of breath.    [provider]  aspirin EC 81 MG tablet Take 81 mg by mouth daily.    [provider]  budesonide-formoterol (SYMBICORT) 80-4.5 MCG/ACT inhaler Inhale 2 puffs into the lungs 2 (two) times daily.    [provider]  cholecalciferol (VITAMIN D) 1000 UNITS tablet Take 1,000 Units by mouth daily.    [provider]  cyanocobalamin 1000 MCG tablet Take 1,000 mcg by mouth daily.     [provider]  FLUoxetine (PROZAC) 20 MG capsule Take 20 mg by mouth daily.     [provider]  fluticasone (FLONASE) 50 MCG/ACT nasal spray Place 2 sprays into both nostrils daily as needed for allergies or rhinitis.    [provider]  glucagon 1 MG injection Inject 1 mg into the vein once as needed (severe insulin reaction).    [provider]  hydrocortisone (ANUSOL-HC) 2.5 % rectal cream Place 1 application rectally 3 (three) times daily as needed for hemorrhoids.     [provider]  insulin glargine (LANTUS) 100 UNIT/ML injection Inject 0.15 mLs (15 Units total) into the skin at bedtime. 02/27/21   Jerald Kief, MD  lidocaine (XYLOCAINE) 5  % ointment Apply 1 application topically 4 (four) times daily as needed for moderate pain.    [provider]  metFORMIN (GLUCOPHAGE-XR) 500 MG 24 hr tablet Take 500 mg by mouth daily with breakfast.    [provider]  methocarbamol (ROBAXIN) 500 MG tablet Take 1 tablet (500 mg total) by mouth every 8 (eight) hours as needed for muscle spasms. 02/27/21   Jerald Kief, MD  metoprolol succinate (TOPROL-XL) 100 MG 24 hr tablet Take 100 mg by mouth daily. Take with or immediately following a meal.    [provider]  Multiple Vitamin (MULTIVITAMIN WITH MINERALS) TABS tablet Take 1 tablet by mouth daily.    [provider]  nitroGLYCERIN (NITROSTAT) 0.4 MG SL tablet Place 0.4 mg under the tongue every 5 (five) minutes as needed for chest pain.    [provider]  Nitroglycerin 0.4 % OINT Place 1 application rectally 2 (two) times daily as needed (for chest pain.).     [provider]  pantoprazole (PROTONIX) 20 MG tablet Take 20 mg by mouth daily.    [provider]  promethazine (PHENERGAN) 25 MG tablet Take 25 mg by mouth every 6 (six) hours as needed for nausea or vomiting.    [provider]  ranolazine (RANEXA) 500 MG 12 hr tablet Take 500 mg by mouth 2 (two) times daily.    [provider]  rosuvastatin (CRESTOR) 10 MG tablet Take 10 mg  by mouth daily.    [provider]  temazepam (RESTORIL) 7.5 MG capsule Take 7.5 mg by mouth at bedtime as needed for sleep.    [provider]  traMADol (ULTRAM) 50 MG tablet Take 1 tablet (50 mg total) every 6 (six) hours as needed by mouth. 02/15/17   Bethel Born, PA-C  traZODone (DESYREL) 50 MG tablet Take 50 mg by mouth at bedtime.    [provider]  triamcinolone cream (KENALOG) 0.5 % Apply 1 application topically 2 (two) times daily as needed (for skin irritation).     [provider]      Allergies    Buchu-cornsilk-ch  grass-hydran, Celebrex [celecoxib], Ciprofloxacin, Cymbalta [duloxetine hcl], Dilaudid [hydromorphone hcl], Fentanyl and related, Glucophage [metformin hcl], Oxycodone, Pregabalin, Hydrocodone-acetaminophen, Morphine and codeine, Sulfa antibiotics, and Vicodin [hydrocodone-acetaminophen]    Review of Systems   Review of Systems  Gastrointestinal:  Positive for abdominal pain.    Physical Exam Updated Vital Signs BP (!) 140/73   Pulse 63   Temp 98 F (36.7 C) (Oral)   Resp 16   SpO2 97%  Physical Exam Vitals and nursing note reviewed.  Constitutional:      General: She is not in acute distress.    Appearance: She is obese. She is not toxic-appearing.  HENT:     Head: Normocephalic.  Cardiovascular:     Rate and Rhythm: Normal rate and regular rhythm.  Abdominal:     General: Abdomen is flat.     Palpations: Abdomen is soft.     Tenderness: There is no abdominal tenderness. There is no guarding or rebound.     Hernia: No hernia is present.     Comments: Right lower quadrant urine ostomy  Skin:    General: Skin is warm.     Capillary Refill: Capillary refill takes less than 2 seconds.  Neurological:     Mental Status: She is alert and oriented to person, place, and time.     ED Results / Procedures / Treatments   Labs (all labs ordered are listed, but only abnormal results are displayed) Labs Reviewed  COMPREHENSIVE METABOLIC PANEL - Abnormal; Notable for the following components:      Result Value   Potassium 3.3 (*)    Glucose, Bld 284 (*)    Creatinine, Ser 2.10 (*)    GFR, Estimated 25 (*)    All other components within normal limits  CBC - Abnormal; Notable for the following components:   RBC 3.86 (*)    All other components within normal limits  URINALYSIS, ROUTINE W REFLEX MICROSCOPIC - Abnormal; Notable for the following components:   APPearance TURBID (*)    Hgb urine dipstick MODERATE (*)    Bilirubin Urine MODERATE (*)    Ketones, ur 15 (*)    Protein,  ur >300 (*)    Nitrite POSITIVE (*)    Leukocytes,Ua TRACE (*)    All other components within normal limits  URINALYSIS, MICROSCOPIC (REFLEX) - Abnormal; Notable for the following components:   Bacteria, UA MANY (*)    Non Squamous Epithelial PRESENT (*)    All other components within normal limits  LIPASE, BLOOD    EKG None  Radiology CT ABDOMEN PELVIS WO CONTRAST  Result Date: 12/17/2022 CLINICAL DATA:  Abdominal pain. EXAM: CT ABDOMEN AND PELVIS WITHOUT CONTRAST TECHNIQUE: Multidetector CT imaging of the abdomen and pelvis was performed following the standard protocol without IV contrast. RADIATION DOSE REDUCTION: This exam was performed  according to the departmental dose-optimization program which includes automated exposure control, adjustment of the mA and/or kV according to patient size and/or use of iterative reconstruction technique. COMPARISON:  CT abdomen and pelvis 02/16/2021 FINDINGS: Lower chest: No acute abnormality. Hepatobiliary: No focal liver abnormality is seen. Status post cholecystectomy. No biliary dilatation. Pancreas: Unremarkable. No pancreatic ductal dilatation or surrounding inflammatory changes. Spleen: Normal in size without focal abnormality. Adrenals/Urinary Tract: Patient is status post cystectomy with right lower quadrant ostomy. There is no hydronephrosis or urinary tract calculi. The kidneys appear normal bilaterally. The adrenal glands are within normal limits. Stomach/Bowel: There are postsurgical changes in the right colon. There is no bowel obstruction, focal inflammation, pneumatosis or free air. Colonic diverticulosis is present. The stomach is within normal limits. Multiple small bowel loops approximate the anterior abdominal wall similar to the prior study likely related to adhesive disease. Vascular/Lymphatic: Aortic atherosclerosis. No enlarged abdominal or pelvic lymph nodes. Reproductive: Status post hysterectomy.  Adnexa are unremarkable. Other: There  is no focal abdominal wall hernia. There some scarring in the anterior abdominal wall. There are focal areas of subcutaneous density just beneath the skin surface in the anterior left abdominal wall, unchanged from 2022. There is no ascites. Musculoskeletal: No acute or significant osseous findings. IMPRESSION: 1. No acute localizing process in the abdomen or pelvis. 2. Status post cystectomy with right lower quadrant ostomy. No bowel obstruction. 3. Colonic diverticulosis without evidence for acute diverticulitis. 4. Aortic atherosclerosis. Aortic Atherosclerosis (ICD10-I70.0). Electronically Signed   By: Darliss Cheney M.D.   On: 12/17/2022 21:53    Procedures Procedures    Medications Ordered in ED Medications  acetaminophen (TYLENOL) 325 MG tablet (  Not Given 12/17/22 2006)  acetaminophen (TYLENOL) tablet 650 mg (650 mg Oral Given 12/17/22 2000)  ondansetron (ZOFRAN-ODT) disintegrating tablet 4 mg (4 mg Oral Given 12/17/22 2000)    ED Course/ Medical Decision Making/ A&P Clinical Course as of 12/17/22 2248  Mon Dec 17, 2022  2207 CT ABDOMEN PELVIS WO CONTRAST IMPRESSION: 1. No acute localizing process in the abdomen or pelvis. 2. Status post cystectomy with right lower quadrant ostomy. No bowel obstruction. 3. Colonic diverticulosis without evidence for acute diverticulitis. 4. Aortic atherosclerosis.  Aortic Atherosclerosis (ICD10-I70.0).   [TY]  2234 Comprehensive metabolic panel(!) Appears to have a baseline creatinine roughly one 1.5 or so as compared to last lab in July. [TY]  2234 Urinalysis, Routine w reflex microscopic -Urine, Clean Catch(!) Taken from diverting ostomy. [TY]    Clinical Course User Index [TY] Coral Spikes, DO                                 Medical Decision Making 67 year old female present emergency department for lower abdominal pain.  Afebrile vital signs reassuring.  Physical exam with soft benign abdomen.  Has extensive surgical history.  Therefore  CT scan ordered.  Negative for acute pathology.  Patient with largely reassuring labs with slight rise in her creatinine, but has some baseline chronic kidney disease and does not appear to be too far off from her baseline.  She is still making urine and her colon pouch stool putting out urine.  Will not treat for UTI as the urinalysis was taken from the colon pouch.  She has no fever tachycardia leukocytosis to suggest systemic infection.  She has no transaminitis to suggest hepatobiliary disease.  Lipase normal.  Pancreatitis unlikely.  Patient currently symptom-free,  tolerating p.o.  Discussed her kidney functions and states that she has a upcoming appointment with her primary doctors in several days.  She will follow-up with them.  Given negative workup stable vital signs and reassuring exam will discharge in stable condition.  Return precautions given.  Amount and/or Complexity of Data Reviewed External Data Reviewed: notes.    Details: Per urology note: "ALEXY HOOFNAGLE is a 67 y.o. Female who presents today for annual follow-up of right colon pouch. Patient had cystolitholapaxy 11/02/21. She states that she did well following this. She has had no Utis over the past year. She continues to catheterize ~7 times daily using a 16Fr coude tip catheter. She states that overall she is able to pass the catheter easily, but occasionally she will struggle with insertion and it will take her 3-4 tries to pass the catheter. She also reports intermittent leaking over the past few months. This happens about twice a week on average, of varying volumes. She states that leakage is not just when her pouch is full. She denies any fevers, chills, hematuria, flank pain or other bothersome symptoms.   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 Decision-making details documented in ED Course. Radiology: ordered. Decision-making details documented in ED Course.  Risk OTC drugs. Prescription drug  management.          Final Clinical Impression(s) / ED Diagnoses Final diagnoses:  None    Rx / DC Orders ED Discharge Orders     None         Coral Spikes, DO 12/17/22 2248

## 2022-12-17 NOTE — ED Notes (Signed)
Over ridden 3rd pill of 325mg  Tylenol due to the Pt dropping pill 2 of 2.Marland KitchenMarland Kitchen

## 2022-12-17 NOTE — ED Triage Notes (Signed)
Abd cramping. Points to lower abd- bilateral. Started Saturday. Reports intermittent diarrhea. No other symptoms such as body aches, fevers, chills. +N,-D, -CP, -SOB.  Has long hx of stomach upset-take bentyl daily and imodium as needed. Hx diabetes.

## 2023-03-15 ENCOUNTER — Encounter (HOSPITAL_COMMUNITY): Payer: Self-pay

## 2023-03-15 ENCOUNTER — Other Ambulatory Visit: Payer: Self-pay

## 2023-03-15 ENCOUNTER — Observation Stay (HOSPITAL_COMMUNITY)
Admission: EM | Admit: 2023-03-15 | Discharge: 2023-03-19 | Disposition: A | Discharge status: Home / Self Care | Payer: Medicare PPO | Attending: Internal Medicine | Admitting: Internal Medicine

## 2023-03-15 ENCOUNTER — Emergency Department (HOSPITAL_COMMUNITY): Payer: Medicare PPO

## 2023-03-15 DIAGNOSIS — Z794 Long term (current) use of insulin: Secondary | ICD-10-CM

## 2023-03-15 DIAGNOSIS — K222 Esophageal obstruction: Secondary | ICD-10-CM | POA: Diagnosis not present

## 2023-03-15 DIAGNOSIS — N1832 Chronic kidney disease, stage 3b: Secondary | ICD-10-CM | POA: Diagnosis not present

## 2023-03-15 DIAGNOSIS — K2100 Gastro-esophageal reflux disease with esophagitis, without bleeding: Secondary | ICD-10-CM | POA: Insufficient documentation

## 2023-03-15 DIAGNOSIS — J45909 Unspecified asthma, uncomplicated: Secondary | ICD-10-CM | POA: Insufficient documentation

## 2023-03-15 DIAGNOSIS — Z79899 Other long term (current) drug therapy: Secondary | ICD-10-CM | POA: Insufficient documentation

## 2023-03-15 DIAGNOSIS — Z7984 Long term (current) use of oral hypoglycemic drugs: Secondary | ICD-10-CM | POA: Diagnosis not present

## 2023-03-15 DIAGNOSIS — E119 Type 2 diabetes mellitus without complications: Secondary | ICD-10-CM | POA: Diagnosis not present

## 2023-03-15 DIAGNOSIS — Z87891 Personal history of nicotine dependence: Secondary | ICD-10-CM | POA: Diagnosis not present

## 2023-03-15 DIAGNOSIS — I2510 Atherosclerotic heart disease of native coronary artery without angina pectoris: Secondary | ICD-10-CM | POA: Diagnosis not present

## 2023-03-15 DIAGNOSIS — Z7982 Long term (current) use of aspirin: Secondary | ICD-10-CM | POA: Insufficient documentation

## 2023-03-15 DIAGNOSIS — I129 Hypertensive chronic kidney disease with stage 1 through stage 4 chronic kidney disease, or unspecified chronic kidney disease: Secondary | ICD-10-CM | POA: Insufficient documentation

## 2023-03-15 DIAGNOSIS — Z906 Acquired absence of other parts of urinary tract: Secondary | ICD-10-CM

## 2023-03-15 DIAGNOSIS — R1312 Dysphagia, oropharyngeal phase: Secondary | ICD-10-CM | POA: Diagnosis not present

## 2023-03-15 DIAGNOSIS — R1310 Dysphagia, unspecified: Principal | ICD-10-CM

## 2023-03-15 DIAGNOSIS — R1319 Other dysphagia: Principal | ICD-10-CM

## 2023-03-15 DIAGNOSIS — E1122 Type 2 diabetes mellitus with diabetic chronic kidney disease: Secondary | ICD-10-CM | POA: Insufficient documentation

## 2023-03-15 DIAGNOSIS — R1030 Lower abdominal pain, unspecified: Secondary | ICD-10-CM | POA: Diagnosis not present

## 2023-03-15 DIAGNOSIS — I10 Essential (primary) hypertension: Secondary | ICD-10-CM | POA: Diagnosis present

## 2023-03-15 LAB — CBC WITH DIFFERENTIAL/PLATELET
Abs Immature Granulocytes: 0.04 10*3/uL (ref 0.00–0.07)
Basophils Absolute: 0 10*3/uL (ref 0.0–0.1)
Basophils Relative: 0 %
Eosinophils Absolute: 0.3 10*3/uL (ref 0.0–0.5)
Eosinophils Relative: 3 %
HCT: 39.3 % (ref 36.0–46.0)
Hemoglobin: 12.9 g/dL (ref 12.0–15.0)
Immature Granulocytes: 0 %
Lymphocytes Relative: 20 %
Lymphs Abs: 1.8 10*3/uL (ref 0.7–4.0)
MCH: 32.6 pg (ref 26.0–34.0)
MCHC: 32.8 g/dL (ref 30.0–36.0)
MCV: 99.2 fL (ref 80.0–100.0)
Monocytes Absolute: 0.9 10*3/uL (ref 0.1–1.0)
Monocytes Relative: 10 %
Neutro Abs: 6.2 10*3/uL (ref 1.7–7.7)
Neutrophils Relative %: 67 %
Platelets: 349 10*3/uL (ref 150–400)
RBC: 3.96 MIL/uL (ref 3.87–5.11)
RDW: 13.1 % (ref 11.5–15.5)
WBC: 9.3 10*3/uL (ref 4.0–10.5)
nRBC: 0 % (ref 0.0–0.2)

## 2023-03-15 LAB — URINALYSIS, ROUTINE W REFLEX MICROSCOPIC
Bacteria, UA: NONE SEEN
Bilirubin Urine: NEGATIVE
Glucose, UA: NEGATIVE mg/dL
Hgb urine dipstick: NEGATIVE
Ketones, ur: NEGATIVE mg/dL
Nitrite: NEGATIVE
Protein, ur: 100 mg/dL — AB
Specific Gravity, Urine: 1.013 (ref 1.005–1.030)
pH: 6 (ref 5.0–8.0)

## 2023-03-15 LAB — COMPREHENSIVE METABOLIC PANEL
ALT: 27 U/L (ref 0–44)
AST: 33 U/L (ref 15–41)
Albumin: 3.8 g/dL (ref 3.5–5.0)
Alkaline Phosphatase: 70 U/L (ref 38–126)
Anion gap: 12 (ref 5–15)
BUN: 21 mg/dL (ref 8–23)
CO2: 21 mmol/L — ABNORMAL LOW (ref 22–32)
Calcium: 9.3 mg/dL (ref 8.9–10.3)
Chloride: 104 mmol/L (ref 98–111)
Creatinine, Ser: 1.39 mg/dL — ABNORMAL HIGH (ref 0.44–1.00)
GFR, Estimated: 41 mL/min — ABNORMAL LOW (ref >60)
Glucose, Bld: 175 mg/dL — ABNORMAL HIGH (ref 70–99)
Potassium: 3.5 mmol/L (ref 3.5–5.1)
Sodium: 137 mmol/L (ref 135–145)
Total Bilirubin: 0.7 mg/dL (ref <1.2)
Total Protein: 7.8 g/dL (ref 6.5–8.1)

## 2023-03-15 LAB — GLUCOSE, CAPILLARY: Glucose-Capillary: 156 mg/dL — ABNORMAL HIGH (ref 70–99)

## 2023-03-15 MED ORDER — FLUOXETINE HCL 20 MG PO CAPS
20.0000 mg | ORAL_CAPSULE | Freq: Every day | ORAL | Status: DC
  Administered 2023-03-16 – 2023-03-19 (×4): 20 mg via ORAL
  Filled 2023-03-15 (×4): qty 1

## 2023-03-15 MED ORDER — METOPROLOL SUCCINATE ER 50 MG PO TB24
100.0000 mg | ORAL_TABLET | Freq: Every day | ORAL | Status: DC
  Administered 2023-03-16 – 2023-03-19 (×4): 100 mg via ORAL
  Filled 2023-03-15 (×4): qty 2

## 2023-03-15 MED ORDER — ROSUVASTATIN CALCIUM 20 MG PO TABS
20.0000 mg | ORAL_TABLET | Freq: Every day | ORAL | Status: DC
  Administered 2023-03-16 – 2023-03-19 (×4): 20 mg via ORAL
  Filled 2023-03-15 (×4): qty 1

## 2023-03-15 MED ORDER — ONDANSETRON HCL 4 MG PO TABS: 4.0000 mg | ORAL_TABLET | Freq: Four times a day (QID) | ORAL | Status: DC | PRN

## 2023-03-15 MED ORDER — IOHEXOL 300 MG/ML  SOLN
100.0000 mL | Freq: Once | INTRAMUSCULAR | Status: AC | PRN
  Administered 2023-03-15: 100 mL via INTRAVENOUS

## 2023-03-15 MED ORDER — FENTANYL CITRATE PF 50 MCG/ML IJ SOSY
50.0000 ug | PREFILLED_SYRINGE | Freq: Once | INTRAMUSCULAR | Status: AC
  Administered 2023-03-15: 50 ug via INTRAVENOUS
  Filled 2023-03-15: qty 1

## 2023-03-15 MED ORDER — INSULIN GLARGINE-YFGN 100 UNIT/ML ~~LOC~~ SOLN
25.0000 [IU] | Freq: Every day | SUBCUTANEOUS | Status: DC
  Administered 2023-03-16 – 2023-03-19 (×4): 25 [IU] via SUBCUTANEOUS
  Filled 2023-03-15 (×4): qty 0.25

## 2023-03-15 MED ORDER — LISINOPRIL 5 MG PO TABS
5.0000 mg | ORAL_TABLET | Freq: Every day | ORAL | Status: DC
  Administered 2023-03-16 – 2023-03-19 (×4): 5 mg via ORAL
  Filled 2023-03-15 (×4): qty 1

## 2023-03-15 MED ORDER — RANOLAZINE ER 500 MG PO TB12
500.0000 mg | ORAL_TABLET | Freq: Two times a day (BID) | ORAL | Status: DC
  Administered 2023-03-15 – 2023-03-19 (×8): 500 mg via ORAL
  Filled 2023-03-15 (×8): qty 1

## 2023-03-15 MED ORDER — HYDROCHLOROTHIAZIDE 12.5 MG PO TABS
12.5000 mg | ORAL_TABLET | Freq: Every day | ORAL | Status: DC
  Administered 2023-03-16 – 2023-03-19 (×4): 12.5 mg via ORAL
  Filled 2023-03-15 (×4): qty 1

## 2023-03-15 MED ORDER — ACETAMINOPHEN 325 MG PO TABS
650.0000 mg | ORAL_TABLET | Freq: Four times a day (QID) | ORAL | Status: DC | PRN
  Administered 2023-03-16 – 2023-03-17 (×3): 650 mg via ORAL
  Filled 2023-03-15 (×4): qty 2

## 2023-03-15 MED ORDER — ONDANSETRON HCL 4 MG/2ML IJ SOLN: 4.0000 mg | Freq: Four times a day (QID) | INTRAMUSCULAR | Status: DC | PRN

## 2023-03-15 MED ORDER — TRAZODONE HCL 50 MG PO TABS
50.0000 mg | ORAL_TABLET | Freq: Every day | ORAL | Status: DC
  Administered 2023-03-15 – 2023-03-18 (×4): 50 mg via ORAL
  Filled 2023-03-15 (×4): qty 1

## 2023-03-15 MED ORDER — INSULIN ASPART 100 UNIT/ML IJ SOLN: 0.0000 [IU] | Freq: Every day | INTRAMUSCULAR | Status: DC

## 2023-03-15 MED ORDER — ACETAMINOPHEN 650 MG RE SUPP: 650.0000 mg | Freq: Four times a day (QID) | RECTAL | Status: DC | PRN

## 2023-03-15 MED ORDER — ENOXAPARIN SODIUM 40 MG/0.4ML IJ SOSY
40.0000 mg | PREFILLED_SYRINGE | Freq: Every day | INTRAMUSCULAR | Status: DC
  Administered 2023-03-15 – 2023-03-19 (×5): 40 mg via SUBCUTANEOUS
  Filled 2023-03-15 (×5): qty 0.4

## 2023-03-15 MED ORDER — AMLODIPINE BESYLATE 10 MG PO TABS
10.0000 mg | ORAL_TABLET | Freq: Every day | ORAL | Status: DC
  Administered 2023-03-16 – 2023-03-19 (×4): 10 mg via ORAL
  Filled 2023-03-15 (×4): qty 1

## 2023-03-15 MED ORDER — ISOSORBIDE MONONITRATE ER 60 MG PO TB24
60.0000 mg | ORAL_TABLET | Freq: Every day | ORAL | Status: DC
  Administered 2023-03-16 – 2023-03-19 (×4): 60 mg via ORAL
  Filled 2023-03-15 (×4): qty 1

## 2023-03-15 MED ORDER — PANTOPRAZOLE SODIUM 40 MG IV SOLR
40.0000 mg | Freq: Two times a day (BID) | INTRAVENOUS | Status: DC
  Administered 2023-03-15 – 2023-03-19 (×8): 40 mg via INTRAVENOUS
  Filled 2023-03-15 (×8): qty 10

## 2023-03-15 MED ORDER — INSULIN ASPART 100 UNIT/ML IJ SOLN
0.0000 [IU] | Freq: Three times a day (TID) | INTRAMUSCULAR | Status: DC
  Administered 2023-03-16: 3 [IU] via SUBCUTANEOUS
  Administered 2023-03-17 – 2023-03-19 (×4): 2 [IU] via SUBCUTANEOUS

## 2023-03-15 NOTE — ED Provider Notes (Signed)
[place] EMERGENCY DEPARTMENT AT [facility] Provider Note   CSN: [id number] Arrival date & time: 03/15/23  1247     History {Add pertinent medical, surgical, social history, OB history to HPI:1} Chief Complaint  Patient presents with   Flank Pain   Dysphagia    Laurie [name] is a 67 y.o. female.  HPI    Patient comes in Home Medications Prior to Admission medications   Medication Sig Start Date End Date Taking? Authorizing Provider  albuterol (PROVENTIL HFA;VENTOLIN HFA) 108 (90 BASE) MCG/ACT inhaler Inhale 2 puffs into the lungs every 4 (four) hours as needed for wheezing or shortness of breath.    [provider]  aspirin EC 81 MG tablet Take 81 mg by mouth daily.    [provider]  budesonide-formoterol (SYMBICORT) 80-4.5 MCG/ACT inhaler Inhale 2 puffs into the lungs 2 (two) times daily.    [provider]  cholecalciferol (VITAMIN D) 1000 UNITS tablet Take 1,000 Units by mouth daily.    [provider]  cyanocobalamin 1000 MCG tablet Take 1,000 mcg by mouth daily.     [provider]  FLUoxetine (PROZAC) 20 MG capsule Take 20 mg by mouth daily.     [provider]  fluticasone (FLONASE) 50 MCG/ACT nasal spray Place 2 sprays into both nostrils daily as needed for allergies or rhinitis.    [provider]  glucagon 1 MG injection Inject 1 mg into the vein once as needed (severe insulin reaction).    [provider]  hydrocortisone (ANUSOL-HC) 2.5 % rectal cream Place 1 application rectally 3 (three) times daily as needed for hemorrhoids.     [provider]  insulin glargine (LANTUS) 100 UNIT/ML injection Inject 0.15 mLs (15 Units total) into the skin at bedtime. 02/27/21   Jerald Kief, MD  lidocaine (XYLOCAINE) 5 % ointment Apply 1 application topically 4 (four) times daily as needed for moderate pain.    [provider]  metFORMIN (GLUCOPHAGE-XR) 500 MG 24 hr  tablet Take 500 mg by mouth daily with breakfast.    [provider]  methocarbamol (ROBAXIN) 500 MG tablet Take 1 tablet (500 mg total) by mouth every 8 (eight) hours as needed for muscle spasms. 02/27/21   Jerald Kief, MD  metoprolol succinate (TOPROL-XL) 100 MG 24 hr tablet Take 100 mg by mouth daily. Take with or immediately following a meal.    [provider]  Multiple Vitamin (MULTIVITAMIN WITH MINERALS) TABS tablet Take 1 tablet by mouth daily.    [provider]  nitroGLYCERIN (NITROSTAT) 0.4 MG SL tablet Place 0.4 mg under the tongue every 5 (five) minutes as needed for chest pain.    [provider]  Nitroglycerin 0.4 % OINT Place 1 application rectally 2 (two) times daily as needed (for chest pain.).     [provider]  pantoprazole (PROTONIX) 20 MG tablet Take 20 mg by mouth daily.    [provider]  promethazine (PHENERGAN) 25 MG tablet Take 25 mg by mouth every 6 (six) hours as needed for nausea or vomiting.    [provider]  ranolazine (RANEXA) 500 MG 12 hr tablet Take 500 mg by mouth 2 (two) times daily.    [provider]  rosuvastatin (CRESTOR) 10 MG tablet Take 10 mg by mouth daily.    [provider]  temazepam (RESTORIL) 7.5 MG capsule Take 7.5 mg by mouth at bedtime as needed for sleep.  [provider]  traMADol (ULTRAM) 50 MG tablet Take 1 tablet (50 mg total) every 6 (six) hours as needed by mouth. 02/15/17   Bethel Born, PA-C  traZODone (DESYREL) 50 MG tablet Take 50 mg by mouth at bedtime.    [provider]  triamcinolone cream (KENALOG) 0.5 % Apply 1 application topically 2 (two) times daily as needed (for skin irritation).     [provider]      Allergies    Buchu-cornsilk-ch grass-hydran, Celebrex [celecoxib], Ciprofloxacin, Cymbalta [duloxetine hcl], Dilaudid [hydromorphone hcl], Fentanyl and related, Glucophage [metformin hcl], Oxycodone,  Pregabalin, Hydrocodone-acetaminophen, Morphine and codeine, Sulfa antibiotics, and Vicodin [hydrocodone-acetaminophen]    Review of Systems   Review of Systems  Physical Exam Updated Vital Signs BP 132/69   Pulse 86   Temp 98.4 F (36.9 C) (Oral)   Resp 18   Ht 5\' 4"  (1.626 m)   Wt 90.7 kg   SpO2 100%   BMI 34.33 kg/m  Physical Exam  ED Results / Procedures / Treatments   Labs (all labs ordered are listed, but only abnormal results are displayed) Labs Reviewed  COMPREHENSIVE METABOLIC PANEL - Abnormal; Notable for the following components:      Result Value   CO2 21 (*)    Glucose, Bld 175 (*)    Creatinine, Ser 1.39 (*)    GFR, Estimated 41 (*)    All other components within normal limits  URINALYSIS, ROUTINE W REFLEX MICROSCOPIC - Abnormal; Notable for the following components:   APPearance CLOUDY (*)    Protein, ur 100 (*)    Leukocytes,Ua TRACE (*)    All other components within normal limits  CBC WITH DIFFERENTIAL/PLATELET    EKG None  Radiology DG Chest Portable 1 View  Result Date: 03/15/2023 CLINICAL DATA:  Dysphagia.  Right-sided flank pain. EXAM: PORTABLE CHEST 1 VIEW COMPARISON:  02/18/2021. FINDINGS: Bilateral lung fields are clear. No acute consolidation or lung collapse. No pulmonary edema. Redemonstration of elevated right hemidiaphragm. Bilateral costophrenic angles are clear. Normal cardio-mediastinal silhouette. No acute osseous abnormalities. The soft tissues are within normal limits. IMPRESSION: No active disease. Electronically Signed   By: Jules Schick M.D.   On: 03/15/2023 14:52    Procedures Procedures  {Document cardiac monitor, telemetry assessment procedure when appropriate:1}  Medications Ordered in ED Medications  iohexol (OMNIPAQUE) 300 MG/ML solution 100 mL (has no administration in time range)  fentaNYL (SUBLIMAZE) injection 50 mcg (50 mcg Intravenous Given 03/15/23 1438)    ED Course/ Medical Decision Making/ A&P   {    Click here for ABCD2, HEART and other calculatorsREFRESH Note before signing :1}                              Medical Decision Making Amount and/or Complexity of Data Reviewed Radiology: ordered.  Risk Prescription drug management.   ***  {Document critical care time when appropriate:1} {Document review of labs and clinical decision tools ie heart score, Chads2Vasc2 etc:1}  {Document your independent review of radiology images, and any outside records:1} {Document your discussion with family members, caretakers, and with consultants:1} {Document social determinants of health affecting pt's care:1} {Document your decision making why or why not admission, treatments were needed:1} Final Clinical Impression(s) / ED Diagnoses Final diagnoses:  None    Rx / DC Orders ED Discharge Orders     None

## 2023-03-15 NOTE — H&P (Signed)
History and Physical    Laurie [name] NFA:[id number] DOB: 1956-03-30 DOA: 03/15/2023  PCP: Margit Hanks, MD Patient coming from: Home.  Independently ambulates at baseline.  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  Patient reports worsening dysphagia that she describes as pain and food getting stuck in her throat when she tries to swallow.  Progressive over the last 2 weeks to the point that it hurts to even drink fluid.  She also reports pain across medial abdomen.  She describes the pain as sharp.  Pain is on and off.  Ongoing for about 2 weeks.  No clear alleviating or aggravating factor.  She has associated nausea without emesis.  She does self-catheterization to new bladder through abdominal wall hole.  Denies urine color change.  Denies fever, chills, cough, chest pain, shortness of breath, diarrhea and constipation.   Patient denies smoking cigarette, drinking alcohol recreational drug use.  Interested in cardiopulmonary resuscitation in event of sudden cardiopulmonary arrest.  In ED, stable vitals. Cr 1.4 (1.4 on 11/5).  BUN 21.  GFR 41.  CBC, UA and CXR without significant finding.  CT abdomen and pelvis with contrast showed RLQ urinary diversion with tubular density within the neobladder which was previously visualized within subcutaneous soft tissue of the right abdominal ostomy, right hemicolectomy with ileocolic anastomosis and diverticulosis in sigmoid colon.  Per EDP, GI planning on doing endoscopy and possibly dilation (Dr. Dulce Sellar).  Urology consulted as well.  Admission requested.  ROS All review of system negative except for pertinent positives and negatives as history of present illness above.  PMH Past Medical History:   Diagnosis Date   Anal fissure    Asthma    B12 deficiency    Candidal esophagitis (HCC)    Carpal tunnel syndrome    Chronic abdominal pain    Chronic UTI    CKD (chronic kidney disease)    Coronary artery disease    Diabetes mellitus without complication (HCC)    Diabetic gastropathy (HCC)    Episodic low back pain    GERD (gastroesophageal reflux disease)    GI bleed    Glaucoma    Hepatitis C    Hypertension    IBS (irritable bowel syndrome)    Interstitial cystitis    Iron deficiency anemia    Myofascial pain syndrome    Obesity (BMI 35.0-39.9 without comorbidity)    Plantar fasciitis    PSH Past Surgical History:  Procedure Laterality Date   ABDOMINAL HYSTERECTOMY     BILATERAL CARPAL TUNNEL RELEASE     BREAST SURGERY     BUBBLE STUDY  02/24/2021   Procedure: BUBBLE STUDY;  Surgeon: Lewayne Bunting, MD;  Location: [facility] ENDOSCOPY;  Service: Cardiovascular;;   CHOLECYSTECTOMY     COLONOSCOPY     ESOPHAGOGASTRODUODENOSCOPY     ESOPHAGOGASTRODUODENOSCOPY (EGD) WITH PROPOFOL N/A 08/22/2016   Procedure: ESOPHAGOGASTRODUODENOSCOPY (EGD) WITH PROPOFOL;  Surgeon: Rachael Fee, MD;  Location: [facility] ENDOSCOPY;  Service: Endoscopy;  Laterality: N/A;   ESOPHAGOGASTRODUODENOSCOPY (EGD) WITH PROPOFOL N/A 02/11/2017   Procedure: ESOPHAGOGASTRODUODENOSCOPY (EGD) WITH PROPOFOL;  Surgeon: Beverley Fiedler, MD;  Location: [facility] ENDOSCOPY;  Service: Gastroenterology;  Laterality: N/A;   INCISION AND DRAINAGE ABSCESS Left 06/25/2015   Procedure: INCISION AND DRAINAGE ABSCESS;  Surgeon: Romie Levee, MD;  Location: [facility] ORS;  Service: General;  Laterality: Left;   IR SINUS/FIST TUBE CHK-NON GI  02/23/2021   LAPAROSCOPY N/A 02/06/2021   Procedure: EXPLORATORY LAPAROTOMY; LYSIS OF ADHESIONS; INCISIONAL HERNIA REPAIR,SMALL BOWEL RESECTION;  Surgeon: Kinsinger, De Blanch, MD;  Location: [facility] ORS;  Service: General;  Laterality: N/A;   REVISION UROSTOMY CUTANEOUS     TEE WITHOUT CARDIOVERSION N/A  02/24/2021   Procedure: TRANSESOPHAGEAL ECHOCARDIOGRAM (TEE);  Surgeon: Lewayne Bunting, MD;  Location: [facility] ENDOSCOPY;  Service: Cardiovascular;  Laterality: N/A;   Fam HX Family History  Problem Relation Age of Onset   CAD Mother    Cancer Father    Stroke Brother     Social Hx  reports that she has quit smoking. She has never used smokeless tobacco. She reports that she does not drink alcohol and does not use drugs.  Allergy Allergies  Allergen Reactions   Buchu-Cornsilk-Ch Grass-Hydran Other (See Comments)    Dehydration. Elevated creatini Other reaction(s): Other (See Comments) Dehydration, increase in creatinine   Celebrex [Celecoxib] Itching   Ciprofloxacin Other (See Comments)    No history of rash or SOB. Had kidney problems when she took cipro but was not told she had interstitial nephritis. Other reaction(s): Other (See Comments), Unknown Kidney problems   Cymbalta [Duloxetine Hcl]     GI upset / constipation.    Dilaudid [Hydromorphone Hcl] Itching   Fentanyl And Related Itching   Glucophage [Metformin Hcl] Other (See Comments)    Stopped due to increase creat   Oxycodone Itching   Pregabalin Other (See Comments)    CNS disorder. Headache Other reaction(s): Other (See Comments) Headache, CNS disorder   Hydrocodone-Acetaminophen Rash   Morphine And Codeine Rash and Itching   Sulfa Antibiotics Rash   Vicodin [Hydrocodone-Acetaminophen] Rash   Home Meds Prior to Admission medications   Medication Sig Start Date End Date Taking? Authorizing Provider  albuterol (PROVENTIL HFA;VENTOLIN HFA) 108 (90 BASE) MCG/ACT inhaler Inhale 2 puffs into the lungs every 4 (four) hours as needed for wheezing or shortness of breath.   Yes [provider]  amLODipine (NORVASC) 5 MG tablet Take 10 mg by mouth daily.   Yes [provider]  aspirin EC 81 MG tablet Take 81 mg by mouth daily.   Yes [provider]  cholecalciferol (VITAMIN D) 1000 UNITS  tablet Take 1,000 Units by mouth daily.   Yes [provider]  Continuous Glucose Receiver (DEXCOM G7 RECEIVER) DEVI Inject 1 Device into the skin continuous. 03/22/22  Yes [provider]  Continuous Glucose Sensor (DEXCOM G7 SENSOR) MISC Inject 1 Device into the skin See admin instructions. Every 10 days 03/22/22 03/17/23 Yes [provider]  cyanocobalamin 1000 MCG tablet Take 1,000 mcg by mouth daily.    Yes [provider]  FLUoxetine (PROZAC) 20 MG capsule Take 20 mg by mouth daily.    Yes [provider]  fluticasone (FLONASE) 50 MCG/ACT nasal spray Place 2 sprays into both nostrils daily as needed for allergies or rhinitis.   Yes [provider]  glucagon 1 MG injection Inject 1 mg into the vein once as needed (severe insulin reaction).   Yes [provider]  hydrochlorothiazide (HYDRODIURIL) 12.5 MG tablet Take 12.5 mg by mouth daily.   Yes [provider]  hydrocortisone (ANUSOL-HC) 2.5 % rectal cream Place 1 application rectally 3 (three) times daily as needed for hemorrhoids.    Yes [provider]  insulin glargine (LANTUS) 100 UNIT/ML injection Inject 0.15 mLs (15 Units  total) into the skin at bedtime. Patient taking differently: Inject 45 Units into the skin daily. 02/27/21  Yes Jerald Kief, MD  isosorbide mononitrate (IMDUR) 60 MG 24 hr tablet Take 1 tablet by mouth daily. 09/24/22  Yes [provider]  lidocaine (XYLOCAINE) 5 % ointment Apply 1 application topically 4 (four) times daily as needed for moderate pain.   Yes [provider]  lisinopril (ZESTRIL) 5 MG tablet Take 5 mg by mouth daily.   Yes [provider]  loperamide (IMODIUM) 2 MG capsule Take 2 mg by mouth every 6 (six) hours as needed for diarrhea or loose stools.   Yes [provider]  Magnesium 400 MG TABS Take 1 tablet by mouth daily.   Yes [provider]  metFORMIN (GLUCOPHAGE-XR) 500 MG  24 hr tablet Take 500 mg by mouth daily with breakfast.   Yes [provider]  metoprolol succinate (TOPROL-XL) 100 MG 24 hr tablet Take 100 mg by mouth daily. Take with or immediately following a meal.   Yes [provider]  Multiple Vitamin (MULTIVITAMIN WITH MINERALS) TABS tablet Take 1 tablet by mouth daily.   Yes [provider]  nitroGLYCERIN (NITROSTAT) 0.4 MG SL tablet Place 0.4 mg under the tongue every 5 (five) minutes as needed for chest pain.   Yes [provider]  Nitroglycerin 0.4 % OINT Place 1 application rectally 2 (two) times daily as needed (for chest pain.).    Yes [provider]  OZEMPIC, 1 MG/DOSE, 4 MG/3ML SOPN Inject 1 mg into the skin once a week. 02/12/23 05/07/23 Yes [provider]  pantoprazole (PROTONIX) 20 MG tablet Take 20 mg by mouth daily.   Yes [provider]  promethazine (PHENERGAN) 25 MG tablet Take 25 mg by mouth every 6 (six) hours as needed for nausea or vomiting.   Yes [provider]  ranolazine (RANEXA) 500 MG 12 hr tablet Take 500 mg by mouth 2 (two) times daily.   Yes [provider]  rosuvastatin (CRESTOR) 20 MG tablet Take 20 mg by mouth daily.   Yes [provider]  traZODone (DESYREL) 50 MG tablet Take 50 mg by mouth at bedtime.   Yes [provider]  triamcinolone cream (KENALOG) 0.5 % Apply 1 application topically 2 (two) times daily as needed (for skin irritation).    Yes [provider]  traMADol (ULTRAM) 50 MG tablet Take 1 tablet (50 mg total) every 6 (six) hours as needed by mouth. Patient not taking: Reported on 03/15/2023 02/15/17   Bethel Born, PA-C    Physical Exam: Vitals:   03/15/23 1254 03/15/23 1559  BP: 132/69 (!) 168/80  Pulse: 86 66  Resp: 18 17  Temp: 98.4 F (36.9 C) 98.1 F (36.7 C)  TempSrc: Oral Oral  SpO2: 100% 100%  Weight: 90.7 kg   Height: 5\' 4"  (1.626 m)     GENERAL: No acute distress.  Appears  well.  HEENT: MMM.  No oropharyngeal lesion but limited exam.  Vision and hearing grossly intact.  NECK: Supple.  No apparent JVD.  Some tenderness over right anterior cervical angle but no LAD. RESP:  No IWOB. Good air movement bilaterally. CVS:  RRR. Heart sounds normal.  ABD/GI/GU: Bowel sounds present. Soft. Non tender.  Previous abdominal scar.  Neobladder outlet MSK/EXT:  Moves extremities. No apparent deformity or edema.  SKIN: no apparent skin lesion or wound NEURO: Awake, alert and oriented appropriately.  No gross deficit.  PSYCH:  Calm. Normal affect.   Personally Reviewed Radiological Exams See HPI   Personally Reviewed Labs: CBC: Recent Labs  Lab 03/15/23 1341  WBC 9.3  NEUTROABS 6.2  HGB 12.9  HCT 39.3  MCV 99.2  PLT 349   Basic Metabolic Panel: Recent Labs  Lab 03/15/23 1341  NA 137  K 3.5  CL 104  CO2 21*  GLUCOSE 175*  BUN 21  CREATININE 1.39*  CALCIUM 9.3   GFR: Estimated Creatinine Clearance: 42.3 mL/min (A) (by C-G formula based on SCr of 1.39 mg/dL (H)). Liver Function Tests: Recent Labs  Lab 03/15/23 1341  AST 33  ALT 27  ALKPHOS 70  BILITOT 0.7  PROT 7.8  ALBUMIN 3.8   No results for input(s): "LIPASE", "AMYLASE" in the last 168 hours. No results for input(s): "AMMONIA" in the last 168 hours. Coagulation Profile: No results for input(s): "INR", "PROTIME" in the last 168 hours. Cardiac Enzymes: No results for input(s): "CKTOTAL", "CKMB", "CKMBINDEX", "TROPONINI" in the last 168 hours. BNP (last 3 results) No results for input(s): "PROBNP" in the last 8760 hours. HbA1C: No results for input(s): "HGBA1C" in the last 72 hours. CBG: No results for input(s): "GLUCAP" in the last 168 hours. Lipid Profile: No results for input(s): "CHOL", "HDL", "LDLCALC", "TRIG", "CHOLHDL", "LDLDIRECT" in the last 72 hours. Thyroid Function Tests: No results for input(s): "TSH", "T4TOTAL", "FREET4", "T3FREE", "THYROIDAB" in the last 72 hours. Anemia  Panel: No results for input(s): "VITAMINB12", "FOLATE", "FERRITIN", "TIBC", "IRON", "RETICCTPCT" in the last 72 hours. Urine analysis:    Component Value Date/Time   COLORURINE YELLOW 03/15/2023 1358   APPEARANCEUR CLOUDY (A) 03/15/2023 1358   LABSPEC 1.013 03/15/2023 1358   PHURINE 6.0 03/15/2023 1358   GLUCOSEU NEGATIVE 03/15/2023 1358   HGBUR NEGATIVE 03/15/2023 1358   BILIRUBINUR NEGATIVE 03/15/2023 1358   KETONESUR NEGATIVE 03/15/2023 1358   PROTEINUR 100 (A) 03/15/2023 1358   UROBILINOGEN 0.2 12/28/2014 2013   NITRITE NEGATIVE 03/15/2023 1358   LEUKOCYTESUR TRACE (A) 03/15/2023 1358     Assessment and plan: Dysphagia/odynophagia: Patient with chronic dysphagia.  EGD in 2018 showed LA grade D esophagitis with some ulceration thought to be due to food impaction.  However, she localizes her pain to her throat concerning for oropharyngeal dysphagia.  She has some tenderness over right anterior cervical triangle but no LAD.  No oropharyngeal lesion but limited exam.  -Magic mouthwash with lidocaine -IV Protonix 40 mg twice daily -Clear liquid diet -SLP eval -GI consulted in ED  Intra-abdominal urostomy/urinary diversion-CT abdomen and pelvis showed RLQ urinary diversion with tubular density within the neobladder which was previously visualized within subcutaneous soft tissue of the right abdominal ostomy.  Patient does self-catheterization.  UA without significant finding.  She has generalized abdominal pain.  Evaluated by urology who recommended outpatient follow-up at the [facility] and continuing self-catheterization 7 times a day  Diffuse abdominal pain: Unclear etiology of this.  She has been abdominal surgery.  She has nausea but no emesis.  Denies diarrhea as well.  Abdominal exam is benign.  She has no fever or leukocytosis either.   IDDM-2 with CKD-3B: A1c 6.7 in 11/2022.  Reports using Lantus 45 units daily.  Also on metformin and Ozempic. -CBG monitoring -Semglee 25 units daily  starting tomorrow -SSI-moderate with night coverage.  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  GFR 41.  At baseline -Monitor  History of CAD: No cardiopulmonary symptoms -Continue home meds.   Mild intermittent asthma: Stable  Essential hypertension: BP slightly elevated. -Resume home  meds except HCTZ  Obesity: BMI 34.33.  Already on Ozempic. -Encourage lifestyle change to lose weight  Anxiety and depression -Continue home Prozac  DVT prophylaxis: Subcu Lovenox  Code Status: Full code Family Communication: None at bedside  Consults called: GI, urology Admission status: Observation   Almon Hercules MD [facility]  If 7PM-7AM, please contact night-coverage [email or web address]  03/15/2023, 6:41 PM

## 2023-03-15 NOTE — ED Provider Notes (Signed)
Signout from Dr. Rhunette Croft.  67 year old female with history of esophagitis and dysphagia, history of urostomy.  Here with worsening dysphagia symptoms and also abdominal pain.  GI planning on doing endoscopy and possibly dilation (Dr Dulce Sellar.)  Will need admission to the hospital.  She is pending CT abdomen and pelvis.  Plan is medical admission if no surgical contraindications. Physical Exam  BP 132/69   Pulse 86   Temp 98.4 F (36.9 C) (Oral)   Resp 18   Ht 5\' 4"  (1.626 m)   Wt 90.7 kg   SpO2 100%   BMI 34.33 kg/m   Physical Exam  Procedures  Procedures  ED Course / MDM    Medical Decision Making Amount and/or Complexity of Data Reviewed Radiology: ordered.  Risk Prescription drug management. Decision regarding hospitalization.   Patient CT showing possible tubular fragment in her neobladder.  I asked the patient about this.  She said she does in and out catheterizations.  I asked her if she thought there could be anything inside there and she does not know.  She does endorse abdominal pain but it is more generalized.  Have placed a call into hospitalist for admission.  1740.  Discussed with [facility] hospitalist Dr. Alanda Slim.  He asked if I could put in a consult to urology regarding the CT finding.  6:10 PM.  Discussed with urology Dr. Dalbert Mayotte.  He said he did not think anything emergently needed to be done but urology can consult on her.       Terrilee Files, MD 03/16/23 1058

## 2023-03-15 NOTE — ED Provider Triage Note (Signed)
Emergency Medicine Provider Triage Evaluation Note  Kerington Steurer , a 67 y.o. female  was evaluated in triage.  Pt complains of worsening dysphagia over the past 2 weeks.  She has chronic dysphagia but reports worsening.   She also complains of right-sided abdominal pain that radiates to right side for the past 3 weeks.  Last BM was this morning  Review of Systems  Positive: Dysphagia, abdominal pain Negative: Fevers  Physical Exam  BP 132/69   Pulse 86   Temp 98.4 F (36.9 C) (Oral)   Resp 18   Ht 5\' 4"  (1.626 m)   Wt 90.7 kg   SpO2 100%   BMI 34.33 kg/m  Gen:   Awake, no distress   Resp:  Normal effort  MSK:   Moves extremities without difficulty  Other:    Medical Decision Making  Medically screening exam initiated at 1:02 PM.  Appropriate orders placed.  Rateel Tracina Pinet was informed that the remainder of the evaluation will be completed by another provider, this initial triage assessment does not replace that evaluation, and the importance of remaining in the ED until their evaluation is complete.  Labs and imaging ordered   Judithann Sheen, [place] 03/15/23 1610

## 2023-03-15 NOTE — Consult Note (Signed)
I have been asked to see the patient by Dr. Meridee Score, for evaluation and management of Laurie [name] foreign body.  History of present illness: 67 year old female who presented to the ER with throat pain as well as slightly worsening abdominal pain over the past week.  Patient states that she has had abdominal pain for many years she states that only recently it is increased slightly.  She describes the pain as a cramping pain that last for about 10 to 15 minutes and happens about 5 times a day.  CT scan was done which demonstrates a tubelike structure in the in the a ferret limb of the coke pouch.  When I talk with the patient she is mainly here for throat pain.  Patient denies any fevers chills or vomiting.  She states she has nausea but this has been present for many years and is unchanged.  She states she has had no trouble catheter herself and her urine is draining normally she continues to catheter self 7 times a day with a 16 [place] coud catheter.  Her urologist history is significant for a coke pouch done for interstitial cystitis in 1990 with revision in 1997.  She catheterizes herself 7 times a day using a coud catheter.  Her most recent surgery on her Coke pouch was a percutaneous cystolithotomy in 2023.  CT scan demonstrates bilateral decompressed kidneys no concerns for pyelonephritis no fluid structures around the coke pouch the tubular structure appears to be in the afferent limb.  Patient's labs are within normal limits there are no signs of infection.  Creatinine is normal.   Review of systems: A 12 point comprehensive review of systems was obtained and is negative unless otherwise stated in the history of present illness.  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   Current Outpatient Medications on File Prior to Encounter  Medication Sig Dispense Refill   albuterol (PROVENTIL HFA;VENTOLIN HFA) 108 (90 BASE) MCG/ACT inhaler Inhale 2 puffs into the lungs every 4 (four) hours as needed for wheezing or shortness of breath.     amLODipine (NORVASC) 5 MG tablet Take 10 mg by mouth daily.     aspirin EC 81 MG tablet Take 81 mg by mouth daily.     cholecalciferol (VITAMIN D) 1000 UNITS tablet Take 1,000 Units by mouth daily.     Continuous Glucose Receiver (DEXCOM G7 RECEIVER) DEVI Inject 1 Device into the skin continuous.     Continuous Glucose Sensor (DEXCOM G7 SENSOR) MISC Inject 1 Device into the skin See admin instructions. Every 10 days     cyanocobalamin 1000 MCG tablet Take 1,000  mcg by mouth daily.      FLUoxetine (PROZAC) 20 MG capsule Take 20 mg by mouth daily.      fluticasone (FLONASE) 50 MCG/ACT nasal spray Place 2 sprays into both nostrils daily as needed for allergies or rhinitis.     glucagon 1 MG injection Inject 1 mg into the vein once as needed (severe insulin reaction).     hydrochlorothiazide (HYDRODIURIL) 12.5 MG tablet Take 12.5 mg by mouth daily.     hydrocortisone (ANUSOL-HC) 2.5 % rectal cream Place 1 application rectally 3 (three) times daily as needed for hemorrhoids.      insulin glargine (LANTUS) 100 UNIT/ML  injection Inject 0.15 mLs (15 Units total) into the skin at bedtime. (Patient taking differently: Inject 45 Units into the skin daily.) 10 mL 11   isosorbide mononitrate (IMDUR) 60 MG 24 hr tablet Take 1 tablet by mouth daily.     lidocaine (XYLOCAINE) 5 % ointment Apply 1 application topically 4 (four) times daily as needed for moderate pain.     lisinopril (ZESTRIL) 5 MG tablet Take 5 mg by mouth daily.     loperamide (IMODIUM) 2 MG capsule Take 2 mg by mouth every 6 (six) hours as needed for diarrhea or loose stools.     Magnesium 400 MG TABS Take 1 tablet by mouth daily.     metFORMIN (GLUCOPHAGE-XR) 500 MG 24 hr tablet Take 500 mg by mouth daily with breakfast.     metoprolol succinate (TOPROL-XL) 100 MG 24 hr tablet Take 100 mg by mouth daily. Take with or immediately following a meal.     Multiple Vitamin (MULTIVITAMIN WITH MINERALS) TABS tablet Take 1 tablet by mouth daily.     nitroGLYCERIN (NITROSTAT) 0.4 MG SL tablet Place 0.4 mg under the tongue every 5 (five) minutes as needed for chest pain.     Nitroglycerin 0.4 % OINT Place 1 application rectally 2 (two) times daily as needed (for chest pain.).      OZEMPIC, 1 MG/DOSE, 4 MG/3ML SOPN Inject 1 mg into the skin once a week.     pantoprazole (PROTONIX) 20 MG tablet Take 20 mg by mouth daily.     promethazine (PHENERGAN) 25 MG tablet Take 25 mg by mouth every 6 (six) hours as needed for nausea or vomiting.     ranolazine (RANEXA) 500 MG 12 hr tablet Take 500 mg by mouth 2 (two) times daily.     rosuvastatin (CRESTOR) 20 MG tablet Take 20 mg by mouth daily.     traZODone (DESYREL) 50 MG tablet Take 50 mg by mouth at bedtime.     triamcinolone cream (KENALOG) 0.5 % Apply 1 application topically 2 (two) times daily as needed (for skin irritation).      traMADol (ULTRAM) 50 MG tablet Take 1 tablet (50 mg total) every 6 (six) hours as needed by mouth. (Patient not taking: Reported on 03/15/2023) 15 tablet 0    Past Medical History:   Diagnosis Date   Anal fissure    Asthma    B12 deficiency    Candidal esophagitis (HCC)    Carpal tunnel syndrome    Chronic abdominal pain    Chronic UTI    CKD (chronic kidney disease)    Coronary artery disease    Diabetes mellitus without complication (HCC)    Diabetic gastropathy (HCC)    Episodic low back pain    GERD (gastroesophageal reflux disease)    GI bleed    Glaucoma  Hepatitis C    Hypertension    IBS (irritable bowel syndrome)    Interstitial cystitis    Iron deficiency anemia    Myofascial pain syndrome    Obesity (BMI 35.0-39.9 without comorbidity)    Plantar fasciitis     Past Surgical History:  Procedure Laterality Date   ABDOMINAL HYSTERECTOMY     BILATERAL CARPAL TUNNEL RELEASE     BREAST SURGERY     BUBBLE STUDY  02/24/2021   Procedure: BUBBLE STUDY;  Surgeon: Lewayne Bunting, MD;  Location: [facility];  Service: Cardiovascular;;   CHOLECYSTECTOMY     COLONOSCOPY     ESOPHAGOGASTRODUODENOSCOPY     ESOPHAGOGASTRODUODENOSCOPY (EGD) WITH PROPOFOL N/A 08/22/2016   Procedure: ESOPHAGOGASTRODUODENOSCOPY (EGD) WITH PROPOFOL;  Surgeon: Rachael Fee, MD;  Location: [facility] ENDOSCOPY;  Service: Endoscopy;  Laterality: N/A;   ESOPHAGOGASTRODUODENOSCOPY (EGD) WITH PROPOFOL N/A 02/11/2017   Procedure: ESOPHAGOGASTRODUODENOSCOPY (EGD) WITH PROPOFOL;  Surgeon: Beverley Fiedler, MD;  Location: [facility] ENDOSCOPY;  Service: Gastroenterology;  Laterality: N/A;   INCISION AND DRAINAGE ABSCESS Left 06/25/2015   Procedure: INCISION AND DRAINAGE ABSCESS;  Surgeon: Romie Levee, MD;  Location: [facility] ORS;  Service: General;  Laterality: Left;   IR SINUS/FIST TUBE CHK-NON GI  02/23/2021   LAPAROSCOPY N/A 02/06/2021   Procedure: EXPLORATORY LAPAROTOMY; LYSIS OF ADHESIONS; INCISIONAL HERNIA REPAIR,SMALL BOWEL RESECTION;  Surgeon: Sheliah Hatch De Blanch, MD;  Location: [facility] ORS;  Service: General;  Laterality: N/A;   REVISION UROSTOMY CUTANEOUS     TEE WITHOUT CARDIOVERSION N/A  02/24/2021   Procedure: TRANSESOPHAGEAL ECHOCARDIOGRAM (TEE);  Surgeon: Lewayne Bunting, MD;  Location: [facility] ENDOSCOPY;  Service: Cardiovascular;  Laterality: N/A;    Social History   Tobacco Use   Smoking status: Former   Smokeless tobacco: Never  Substance Use Topics   Alcohol use: No   Drug use: No    Family History  Problem Relation Age of Onset   CAD Mother    Cancer Father    Stroke Brother     PE: Vitals:   03/15/23 1254 03/15/23 1559  BP: 132/69 (!) 168/80  Pulse: 86 66  Resp: 18 17  Temp: 98.4 F (36.9 C) 98.1 F (36.7 C)  TempSrc: Oral Oral  SpO2: 100% 100%  Weight: 90.7 kg   Height: 5\' 4"  (1.626 m)    Patient appears to be in no acute distress  patient is alert and oriented x3 Atraumatic normocephalic head No cervical or supraclavicular lymphadenopathy appreciated No increased work of breathing, no audible wheezes/rhonchi Regular sinus rhythm/rate Abdomen is soft, nontender, nondistended, her ostomy is pink, minimally retracted.  She has previous scars from her ostomy and coke pouch.  There are well-healed. Grossly neurologically intact No identifiable skin lesions  Recent Labs    03/15/23 1341  WBC 9.3  HGB 12.9  HCT 39.3   Recent Labs    03/15/23 1341  NA 137  K 3.5  CL 104  CO2 21*  GLUCOSE 175*  BUN 21  CREATININE 1.39*  CALCIUM 9.3   No results for input(s): "LABPT", "INR" in the last 72 hours. No results for input(s): "LABURIN" in the last 72 hours. Results for orders placed or performed during the hospital encounter of 02/06/21  Resp Panel by RT-PCR (Flu A&B, Covid) Nasopharyngeal Swab     Status: None   Collection Time: 02/06/21 11:55 AM   Specimen: Nasopharyngeal Swab; Nasopharyngeal(NP) swabs in vial transport medium  Result Value Ref Range Status   SARS Coronavirus 2 by RT  PCR NEGATIVE NEGATIVE Final    Comment: (NOTE) SARS-CoV-2 target nucleic acids are NOT DETECTED.  The SARS-CoV-2 RNA is generally detectable in upper  respiratory specimens during the acute phase of infection. The lowest concentration of SARS-CoV-2 viral copies this assay can detect is 138 copies/mL. A negative result does not preclude SARS-Cov-2 infection and should not be used as the sole basis for treatment or other patient management decisions. A negative result may occur with  improper specimen collection/handling, submission of specimen other than nasopharyngeal swab, presence of viral mutation(s) within the areas targeted by this assay, and inadequate number of viral copies(<138 copies/mL). A negative result must be combined with clinical observations, patient history, and epidemiological information. The expected result is Negative.  Fact Sheet for Patients:  BloggerCourse.com  Fact Sheet for Healthcare Providers:  SeriousBroker.it  This test is no t yet approved or cleared by the [place] FDA and  has been authorized for detection and/or diagnosis of SARS-CoV-2 by FDA under an Emergency Use Authorization (EUA). This EUA will remain  in effect (meaning this test can be used) for the duration of the COVID-19 declaration under Section 564(b)(1) of the Act, 21 U.S.C.section 360bbb-3(b)(1), unless the authorization is terminated  or revoked sooner.       Influenza A by PCR NEGATIVE NEGATIVE Final   Influenza B by PCR NEGATIVE NEGATIVE Final    Comment: (NOTE) The Xpert Xpress SARS-CoV-2/FLU/RSV plus assay is intended as an aid in the diagnosis of influenza from Nasopharyngeal swab specimens and should not be used as a sole basis for treatment. Nasal washings and aspirates are unacceptable for Xpert Xpress SARS-CoV-2/FLU/RSV testing.  Fact Sheet for Patients: BloggerCourse.com  Fact Sheet for Healthcare Providers: SeriousBroker.it  This test is not yet approved or cleared by the [place] FDA and has been  authorized for detection and/or diagnosis of SARS-CoV-2 by FDA under an Emergency Use Authorization (EUA). This EUA will remain in effect (meaning this test can be used) for the duration of the COVID-19 declaration under Section 564(b)(1) of the Act, 21 U.S.C. section 360bbb-3(b)(1), unless the authorization is terminated or revoked.  Performed at [facility], [address], [place], [place] [address]   MRSA Next Gen by PCR, Nasal     Status: None   Collection Time: 02/09/21  8:29 AM   Specimen: Nasal Mucosa; Nasal Swab  Result Value Ref Range Status   MRSA by PCR Next Gen NOT DETECTED NOT DETECTED Final    Comment: (NOTE) The GeneXpert MRSA Assay (FDA approved for NASAL specimens only), is one component of a comprehensive MRSA colonization surveillance program. It is not intended to diagnose MRSA infection nor to guide or monitor treatment for MRSA infections. Test performance is not FDA approved in patients less than 56 years old. Performed at [facility], [address], [place], [place] [address]   Aerobic/Anaerobic Culture w Gram Stain (surgical/deep wound)     Status: None   Collection Time: 02/11/21  1:56 PM   Specimen: Abscess  Result Value Ref Range Status   Specimen Description   Final    ABSCESS Performed at [facility], [address] [place], [place] [address]    Special Requests   Final    Normal Performed at [facility], [address], [place], [place] [address]    Gram Stain   Final    NO SQUAMOUS EPITHELIAL CELLS SEEN ABUNDANT WBC SEEN MODERATE GRAM NEGATIVE RODS MODERATE GRAM POSITIVE  COCCI    Culture   Final    ABUNDANT ESCHERICHIA COLI NO ANAEROBES ISOLATED Performed at [facility], [address] [place], [place] [address]    Report Status 02/15/2021 FINAL  Final   Organism ID, Bacteria ESCHERICHIA COLI  Final      Susceptibility   Escherichia coli - MIC*     AMPICILLIN >=32 RESISTANT Resistant     CEFAZOLIN >=64 RESISTANT Resistant     CEFEPIME <=0.12 SENSITIVE Sensitive     CEFTAZIDIME 16 INTERMEDIATE Intermediate     CEFTRIAXONE 32 RESISTANT Resistant     CIPROFLOXACIN <=0.25 SENSITIVE Sensitive     GENTAMICIN <=1 SENSITIVE Sensitive     IMIPENEM <=0.25 SENSITIVE Sensitive     TRIMETH/SULFA <=20 SENSITIVE Sensitive     AMPICILLIN/SULBACTAM >=32 RESISTANT Resistant     PIP/TAZO <=4 SENSITIVE Sensitive     * ABUNDANT ESCHERICHIA COLI  Culture, blood (routine x 2)     Status: None   Collection Time: 02/18/21 12:04 PM   Specimen: BLOOD  Result Value Ref Range Status   Specimen Description   Final    BLOOD RIGHT ANTECUBITAL Performed at [facility], [address], [place], [place] [address]    Special Requests   Final    BOTTLES DRAWN AEROBIC ONLY Blood Culture adequate volume Performed at [facility], [address], [place], [place] [address]    Culture   Final    NO GROWTH 5 DAYS Performed at [facility], [address], [place], [place] [address]    Report Status 02/23/2021 FINAL  Final  Culture, blood (routine x 2)     Status: None   Collection Time: 02/18/21 12:04 PM   Specimen: BLOOD LEFT HAND  Result Value Ref Range Status   Specimen Description   Final    BLOOD LEFT HAND Performed at [facility], [address] [place], [place] [address]    Special Requests   Final    BOTTLES DRAWN AEROBIC ONLY Blood Culture results may not be optimal due to an inadequate volume of blood received in culture bottles Performed at [facility], [address], [place], [place] [address]    Culture   Final    NO GROWTH 5 DAYS Performed at [facility], [address] [place], [place] [address]    Report Status 02/23/2021 FINAL  Final  Urine Culture     Status: Abnormal   Collection Time: 02/18/21 12:48 PM   Specimen: In/Out Cath Urine   Result Value Ref Range Status   Specimen Description   Final    IN/OUT CATH URINE Performed at [facility], [address], [place], [place] [address]    Special Requests   Final    NONE Performed at [facility], [address], [place], [place] [address]    Culture (A)  Final    40,000 COLONIES/mL KLEBSIELLA PNEUMONIAE >=100,000 COLONIES/mL ESCHERICHIA COLI    Report Status 02/21/2021 FINAL  Final   Organism ID, Bacteria KLEBSIELLA PNEUMONIAE (A)  Final   Organism ID, Bacteria ESCHERICHIA COLI (A)  Final      Susceptibility   Escherichia coli - MIC*    AMPICILLIN 8 SENSITIVE Sensitive     CEFAZOLIN <=4 SENSITIVE Sensitive     CEFEPIME <=0.12 SENSITIVE Sensitive     CEFTRIAXONE <=0.25 SENSITIVE Sensitive     CIPROFLOXACIN <=0.25 SENSITIVE Sensitive  GENTAMICIN <=1 SENSITIVE Sensitive     IMIPENEM <=0.25 SENSITIVE Sensitive     NITROFURANTOIN <=16 SENSITIVE Sensitive     TRIMETH/SULFA <=20 SENSITIVE Sensitive     AMPICILLIN/SULBACTAM <=2 SENSITIVE Sensitive     PIP/TAZO <=4 SENSITIVE Sensitive     * >=100,000 COLONIES/mL ESCHERICHIA COLI   Klebsiella pneumoniae - MIC*    AMPICILLIN >=32 RESISTANT Resistant     CEFAZOLIN <=4 SENSITIVE Sensitive     CEFEPIME <=0.12 SENSITIVE Sensitive     CEFTRIAXONE <=0.25 SENSITIVE Sensitive     CIPROFLOXACIN <=0.25 SENSITIVE Sensitive     GENTAMICIN <=1 SENSITIVE Sensitive     IMIPENEM <=0.25 SENSITIVE Sensitive     NITROFURANTOIN 64 INTERMEDIATE Intermediate     TRIMETH/SULFA <=20 SENSITIVE Sensitive     AMPICILLIN/SULBACTAM 8 SENSITIVE Sensitive     PIP/TAZO <=4 SENSITIVE Sensitive     * 40,000 COLONIES/mL KLEBSIELLA PNEUMONIAE    Imaging: IMPRESSION: 1. Status post cystectomy with right lower quadrant urinary diversion. There is a tubular density within the neobladder, previously this was visualized within the subcutaneous soft tissues of the right abdominal ostomy, this may represent a  catheter fragment or stent. Correlate with the patient's surgical/procedural history. 2. Status post right hemicolectomy with ileocolic anastomosis in the right upper quadrant. No obstruction. 3. Diverticular disease of the sigmoid colon without acute inflammatory changes. 4. Aortic atherosclerosis.  Imp: 67 year old female with a history of interstitial cystitis status post Cook pouch in the 90s, see GU history above, who presented to the ER mainly for throat pain but also has some abdominal pain that has been present for many years but is slightly worsened.  CT demonstrated a retained tubular structure in the aferrent limb of the Coch pouch.  Urology was consulted for this.  Patient examined labs there is no concerns for infection the CT shows no concerning findings such as fluid around the neobladder or concerns of the foreign body will cause issues.  There is no hydronephrosis or indications for intervention at this time by urology.  Recommendations: - Recommended the patient follow-up with [facility] for removal of the foreign body no intervention urgently as patient is stable. - Recommend continuing to self cath with her coud catheter 7 times a day as inpatient -Urology will follow peripherally please page with any questions   Thank you for involving me in this patient's care. Please page with any further questions or concerns. Adonis Brook

## 2023-03-15 NOTE — ED Notes (Signed)
ED TO INPATIENT HANDOFF REPORT  Name/Age/Gender Laurie [name] 67 y.o. female  Code Status    Code Status Orders  (From admission, onward)           Start     Ordered   03/15/23 1840  Full code  Continuous       Question:  By:  Answer:  Consent: discussion documented in EHR   03/15/23 1840           Code Status History     Date Active Date Inactive Code Status Order ID Comments User Context   02/06/2021 2350 02/27/2021 2217 Full Code [id number]  Tyrone Nine, MD Inpatient   06/24/2015 2355 07/01/2015 1648 Full Code [id number]  Therisa Doyne, MD Inpatient       Home/SNF/Other Home  Chief Complaint Dysphagia [R13.10]  Level of Care/Admitting Diagnosis ED Disposition     ED Disposition  Admit   Condition  --   Comment  Hospital Area: [facility] [[id number]]  Level of Care: Med-Surg [16]  May place patient in observation at [facility] or [facility] if equivalent level of care is available:: No  Covid Evaluation: Asymptomatic - no recent exposure (last 10 days) testing not required  Diagnosis: Dysphagia [086578]  Admitting Physician: Almon Hercules [[id number]]  Attending Physician: Almon Hercules [phone]          Medical History Past Medical History:  Diagnosis Date   Anal fissure    Asthma    B12 deficiency    Candidal esophagitis (HCC)    Carpal tunnel syndrome    Chronic abdominal pain    Chronic UTI    CKD (chronic kidney disease)    Coronary artery disease    Diabetes mellitus without complication (HCC)    Diabetic gastropathy (HCC)    Episodic low back pain    GERD (gastroesophageal reflux disease)    GI bleed    Glaucoma    Hepatitis C    Hypertension    IBS (irritable bowel syndrome)    Interstitial cystitis    Iron deficiency anemia    Myofascial pain syndrome    Obesity (BMI 35.0-39.9 without comorbidity)    Plantar fasciitis     Allergies Allergies  Allergen Reactions   Buchu-Cornsilk-Ch  Grass-Hydran Other (See Comments)    Dehydration. Elevated creatini Other reaction(s): Other (See Comments) Dehydration, increase in creatinine   Celebrex [Celecoxib] Itching   Ciprofloxacin Other (See Comments)    No history of rash or SOB. Had kidney problems when she took cipro but was not told she had interstitial nephritis. Other reaction(s): Other (See Comments), Unknown Kidney problems   Cymbalta [Duloxetine Hcl]     GI upset / constipation.    Dilaudid [Hydromorphone Hcl] Itching   Fentanyl And Related Itching   Glucophage [Metformin Hcl] Other (See Comments)    Stopped due to increase creat   Oxycodone Itching   Pregabalin Other (See Comments)    CNS disorder. Headache Other reaction(s): Other (See Comments) Headache, CNS disorder   Hydrocodone-Acetaminophen Rash   Morphine And Codeine Rash and Itching   Sulfa Antibiotics Rash   Vicodin [Hydrocodone-Acetaminophen] Rash    IV Location/Drains/Wounds Patient Lines/Drains/Airways Status     Active Line/Drains/Airways     Name Placement date Placement time Site Days   Peripheral IV 03/15/23 22 G 1" Anterior;Left Forearm 03/15/23  1438  Forearm  less than 1   Negative Pressure Wound Therapy Abdomen Mid 02/10/21  1015  --  763   Urostomy Continent RLQ --  --  RLQ  --            Labs/Imaging Results for orders placed or performed during the hospital encounter of 03/15/23 (from the past 48 hour(s))  CBC with Differential     Status: None   Collection Time: 03/15/23  1:41 PM  Result Value Ref Range   WBC 9.3 4.0 - 10.5 K/uL   RBC 3.96 3.87 - 5.11 MIL/uL   Hemoglobin 12.9 12.0 - 15.0 g/dL   HCT 16.1 09.6 - 04.5 %   MCV 99.2 80.0 - 100.0 fL   MCH 32.6 26.0 - 34.0 pg   MCHC 32.8 30.0 - 36.0 g/dL   RDW 40.9 81.1 - 91.4 %   Platelets 349 150 - 400 K/uL   nRBC 0.0 0.0 - 0.2 %   Neutrophils Relative % 67 %   Neutro Abs 6.2 1.7 - 7.7 K/uL   Lymphocytes Relative 20 %   Lymphs Abs 1.8 0.7 - 4.0 K/uL   Monocytes  Relative 10 %   Monocytes Absolute 0.9 0.1 - 1.0 K/uL   Eosinophils Relative 3 %   Eosinophils Absolute 0.3 0.0 - 0.5 K/uL   Basophils Relative 0 %   Basophils Absolute 0.0 0.0 - 0.1 K/uL   Immature Granulocytes 0 %   Abs Immature Granulocytes 0.04 0.00 - 0.07 K/uL    Comment: Performed at [facility], [address] [place], [place] [address]  Comprehensive metabolic panel     Status: Abnormal   Collection Time: 03/15/23  1:41 PM  Result Value Ref Range   Sodium 137 135 - 145 mmol/L   Potassium 3.5 3.5 - 5.1 mmol/L   Chloride 104 98 - 111 mmol/L   CO2 21 (L) 22 - 32 mmol/L   Glucose, Bld 175 (H) 70 - 99 mg/dL    Comment: Glucose reference range applies only to samples taken after fasting for at least 8 hours.   BUN 21 8 - 23 mg/dL   Creatinine, Ser 6.21 (H) 0.44 - 1.00 mg/dL   Calcium 9.3 8.9 - 30.8 mg/dL   Total Protein 7.8 6.5 - 8.1 g/dL   Albumin 3.8 3.5 - 5.0 g/dL   AST 33 15 - 41 U/L   ALT 27 0 - 44 U/L   Alkaline Phosphatase 70 38 - 126 U/L   Total Bilirubin 0.7 <1.2 mg/dL   GFR, Estimated 41 (L) >60 mL/min    Comment: (NOTE) Calculated using the CKD-EPI Creatinine Equation (2021)    Anion gap 12 5 - 15    Comment: Performed at [facility], [address], [place], [place] [address]  Urinalysis, Routine w reflex microscopic -Urine, Clean Catch     Status: Abnormal   Collection Time: 03/15/23  1:58 PM  Result Value Ref Range   Color, Urine YELLOW YELLOW   APPearance CLOUDY (A) CLEAR   Specific Gravity, Urine 1.013 1.005 - 1.030   pH 6.0 5.0 - 8.0   Glucose, UA NEGATIVE NEGATIVE mg/dL   Hgb urine dipstick NEGATIVE NEGATIVE   Bilirubin Urine NEGATIVE NEGATIVE   Ketones, ur NEGATIVE NEGATIVE mg/dL   Protein, ur 696 (A) NEGATIVE mg/dL   Nitrite NEGATIVE NEGATIVE   Leukocytes,Ua TRACE (A) NEGATIVE   RBC / HPF 11-20 0 - 5 RBC/hpf   WBC, UA 21-50 0 - 5 WBC/hpf   Bacteria, UA NONE SEEN NONE SEEN   Squamous Epithelial / HPF 0-5 0  -  5 /HPF   Mucus PRESENT    Hyaline Casts, UA PRESENT    Amorphous Crystal PRESENT     Comment: Performed at [facility], [address] [place], [place] [address]   CT ABDOMEN PELVIS W CONTRAST  Result Date: 03/15/2023 CLINICAL DATA:  Lower quadrant pain previous bladder resection EXAM: CT ABDOMEN AND PELVIS WITH CONTRAST TECHNIQUE: Multidetector CT imaging of the abdomen and pelvis was performed using the standard protocol following bolus administration of intravenous contrast. RADIATION DOSE REDUCTION: This exam was performed according to the departmental dose-optimization program which includes automated exposure control, adjustment of the mA and/or kV according to patient size and/or use of iterative reconstruction technique. CONTRAST:  OMNIPAQUE IOHEXOL 300 MG/ML  SOLN COMPARISON:  CT 12/17/2022, 02/16/2021 FINDINGS: Lower chest: Lung bases demonstrate mild dependent atelectasis and minimal subpleural fibrosis. No acute airspace disease. Hepatobiliary: Cholecystectomy. No biliary dilatation or focal hepatic abnormality Pancreas: Unremarkable. No pancreatic ductal dilatation or surrounding inflammatory changes. Spleen: Normal in size without focal abnormality. Adrenals/Urinary Tract: Thickened adrenal glands without dominant nodule, no change. Kidneys show no hydronephrosis. Status post cystectomy with right lower quadrant urinary diversion. There is a tubular density within the neobladder, previously this was visualized within the subcutaneous soft tissues of the right abdominal ostomy. Stomach/Bowel: Decompressed stomach. No dilated small bowel. Status post right hemicolectomy with ileal colic anastomosis in the right upper quadrant. No obstruction. No acute bowel wall thickening. Diverticular disease of the sigmoid colon without acute inflammatory changes. Multiple small bowel loops abutting the anterior peritoneal cavity possibly due to adhesive disease but no obstructive  changes. Vascular/Lymphatic: Moderate aortic atherosclerosis. No aneurysm. No suspicious lymph nodes. Reproductive: Hysterectomy.  No adnexal mass. Other: Negative for pelvic effusion or free air. Skin thickening and subcutaneous infiltration of the left abdominal wall, no change. Evidence of prior umbilical hernia repair. Musculoskeletal: No acute or suspicious osseous abnormality. IMPRESSION: 1. Status post cystectomy with right lower quadrant urinary diversion. There is a tubular density within the neobladder, previously this was visualized within the subcutaneous soft tissues of the right abdominal ostomy, this may represent a catheter fragment or stent. Correlate with the patient's surgical/procedural history. 2. Status post right hemicolectomy with ileocolic anastomosis in the right upper quadrant. No obstruction. 3. Diverticular disease of the sigmoid colon without acute inflammatory changes. 4. Aortic atherosclerosis. Aortic Atherosclerosis (ICD10-I70.0). Electronically Signed   By: Jasmine Pang M.D.   On: 03/15/2023 16:50   DG Chest Portable 1 View  Result Date: 03/15/2023 CLINICAL DATA:  Dysphagia.  Right-sided flank pain. EXAM: PORTABLE CHEST 1 VIEW COMPARISON:  02/18/2021. FINDINGS: Bilateral lung fields are clear. No acute consolidation or lung collapse. No pulmonary edema. Redemonstration of elevated right hemidiaphragm. Bilateral costophrenic angles are clear. Normal cardio-mediastinal silhouette. No acute osseous abnormalities. The soft tissues are within normal limits. IMPRESSION: No active disease. Electronically Signed   By: Jules Schick M.D.   On: 03/15/2023 14:52    Pending Labs Unresulted Labs (From admission, onward)     Start     Ordered   03/22/23 0500  Creatinine, serum  (enoxaparin (LOVENOX)    CrCl >/= 30 ml/min)  Weekly,   R     Comments: while on enoxaparin therapy    03/15/23 1840   03/16/23 0500  Basic metabolic panel  Tomorrow morning,   R        03/15/23 1840    03/16/23 0500  CBC  Tomorrow morning,   R  03/15/23 1840   03/15/23 1841  Hemoglobin A1c  Once,   R       Comments: To assess prior glycemic control    03/15/23 1841   03/15/23 1840  HIV Antibody (routine testing w rflx)  (HIV Antibody (Routine testing w reflex) panel)  Once,   R        03/15/23 1840            Vitals/Pain Today's Vitals   03/15/23 1254 03/15/23 1559 03/15/23 1844  BP: 132/69 (!) 168/80   Pulse: 86 66   Resp: 18 17   Temp: 98.4 F (36.9 C) 98.1 F (36.7 C)   TempSrc: Oral Oral   SpO2: 100% 100%   Weight: 90.7 kg    Height: 5\' 4"  (1.626 m)    PainSc: 9   9     Isolation Precautions No active isolations  Medications Medications  amLODipine (NORVASC) tablet 10 mg (has no administration in time range)  isosorbide mononitrate (IMDUR) 24 hr tablet 60 mg (has no administration in time range)  lisinopril (ZESTRIL) tablet 5 mg (has no administration in time range)  hydrochlorothiazide (HYDRODIURIL) tablet 12.5 mg (has no administration in time range)  metoprolol succinate (TOPROL-XL) 24 hr tablet 100 mg (has no administration in time range)  rosuvastatin (CRESTOR) tablet 20 mg (has no administration in time range)  ranolazine (RANEXA) 12 hr tablet 500 mg (has no administration in time range)  FLUoxetine (PROZAC) capsule 20 mg (has no administration in time range)  traZODone (DESYREL) tablet 50 mg (has no administration in time range)  enoxaparin (LOVENOX) injection 40 mg (has no administration in time range)  acetaminophen (TYLENOL) tablet 650 mg (has no administration in time range)    Or  acetaminophen (TYLENOL) suppository 650 mg (has no administration in time range)  ondansetron (ZOFRAN) tablet 4 mg (has no administration in time range)    Or  ondansetron (ZOFRAN) injection 4 mg (has no administration in time range)  insulin aspart (novoLOG) injection 0-15 Units (has no administration in time range)  insulin aspart (novoLOG) injection 0-5 Units  (has no administration in time range)  insulin glargine-yfgn (SEMGLEE) injection 25 Units (has no administration in time range)  pantoprazole (PROTONIX) injection 40 mg (has no administration in time range)  fentaNYL (SUBLIMAZE) injection 50 mcg (50 mcg Intravenous Given 03/15/23 1438)  iohexol (OMNIPAQUE) 300 MG/ML solution 100 mL (100 mLs Intravenous Contrast Given 03/15/23 1532)    Mobility walks with person assist

## 2023-03-15 NOTE — ED Triage Notes (Signed)
Patient reports dysphagia worsening over the last 2 weeks. Has had chronic dysphagia for "a while," but this is worse than normal.  Patient also reports right sided flank pain x 3 weeks.

## 2023-03-16 ENCOUNTER — Encounter (HOSPITAL_COMMUNITY): Payer: Self-pay | Admitting: Student

## 2023-03-16 DIAGNOSIS — Z906 Acquired absence of other parts of urinary tract: Secondary | ICD-10-CM

## 2023-03-16 DIAGNOSIS — R1312 Dysphagia, oropharyngeal phase: Secondary | ICD-10-CM

## 2023-03-16 LAB — BASIC METABOLIC PANEL
Anion gap: 10 (ref 5–15)
BUN: 19 mg/dL (ref 8–23)
CO2: 24 mmol/L (ref 22–32)
Calcium: 9 mg/dL (ref 8.9–10.3)
Chloride: 103 mmol/L (ref 98–111)
Creatinine, Ser: 1.28 mg/dL — ABNORMAL HIGH (ref 0.44–1.00)
GFR, Estimated: 46 mL/min — ABNORMAL LOW (ref >60)
Glucose, Bld: 84 mg/dL (ref 70–99)
Potassium: 3.2 mmol/L — ABNORMAL LOW (ref 3.5–5.1)
Sodium: 137 mmol/L (ref 135–145)

## 2023-03-16 LAB — CBC
HCT: 39.2 % (ref 36.0–46.0)
Hemoglobin: 12.9 g/dL (ref 12.0–15.0)
MCH: 32.9 pg (ref 26.0–34.0)
MCHC: 32.9 g/dL (ref 30.0–36.0)
MCV: 100 fL (ref 80.0–100.0)
Platelets: 350 10*3/uL (ref 150–400)
RBC: 3.92 MIL/uL (ref 3.87–5.11)
RDW: 13.3 % (ref 11.5–15.5)
WBC: 8.9 10*3/uL (ref 4.0–10.5)
nRBC: 0 % (ref 0.0–0.2)

## 2023-03-16 LAB — GLUCOSE, CAPILLARY
Glucose-Capillary: 95 mg/dL (ref 70–99)
Glucose-Capillary: 171 mg/dL — ABNORMAL HIGH (ref 70–99)
Glucose-Capillary: 91 mg/dL (ref 70–99)
Glucose-Capillary: 162 mg/dL — ABNORMAL HIGH (ref 70–99)

## 2023-03-16 LAB — HIV ANTIBODY (ROUTINE TESTING W REFLEX): HIV Screen 4th Generation wRfx: NONREACTIVE

## 2023-03-16 LAB — HEMOGLOBIN A1C
Hgb A1c MFr Bld: 8.1 % — ABNORMAL HIGH (ref 4.8–5.6)
Mean Plasma Glucose: 185.77 mg/dL

## 2023-03-16 MED ORDER — NYSTATIN 100000 UNIT/ML MT SUSP
5.0000 mL | Freq: Four times a day (QID) | OROMUCOSAL | Status: DC
  Administered 2023-03-16 – 2023-03-19 (×13): 500000 [IU] via ORAL
  Filled 2023-03-16 (×13): qty 5

## 2023-03-16 MED ORDER — POTASSIUM CHLORIDE 10 MEQ/100ML IV SOLN
10.0000 meq | INTRAVENOUS | Status: AC
  Administered 2023-03-16 (×4): 10 meq via INTRAVENOUS
  Filled 2023-03-16 (×4): qty 100

## 2023-03-16 NOTE — Progress Notes (Signed)
Progress Note    Clarinda Yalda   ZOX:[id number]  DOB: 1955-08-05  DOA: 03/15/2023     0 PCP: Margit Hanks, MD  Initial CC: dysphagia  Hospital Course: Ms. Malloch is a 67 yo female with extensive PMH including recurrent candidal esophagitis, multiple prior surgeries for chronic abdomino-pelvic pain/interstitial cystitis (including prior cystectomy with cystoplasty 1985, later take-down of cystoplasty with ureteral diversion 1991), TAH/SPO in 1996, cholecystectomy, lysis of adhesions.  Patient had cystolitholapaxy 11/02/21. She continues to catheterize ~7 times daily using a 16Fr coude tip catheter.  IC s/p hemi-kock pouch in 1990 and revision in 1997. Other PMH includes DMII, anxiety/depression, GERD, obesity, HCV s/p treatment.  She presents to the hospital due to progressively worsening dysphagia to the point of not tolerating liquids. She was recently seen by ID on 11/05/2022 and was recommended to contact GI to repeat EGD if dysphagia recurred.  This appears to be her first recurrence of dysphagia since that appointment. Last documented episode of candidal esophagitis was 10/04/2021 which was her sixth episode.  She underwent CT abdomen/pelvis for further workup.  Prior right hemicolectomy with ileocolonic anastomosis noted with no signs of obstruction.  Prior cystectomy noted with tubular density within the neobladder.  She was admitted for further workup of her dysphagia and GI was consulted on admission.  Interval History:  No issues overnight.  Sitting on edge of bed when seen this morning and comfortable.  Biggest complaint is inability to swallow solids or liquids.  Confirms she is able to drain her bladder well with her coud catheters.  Assessment and Plan: * Dysphagia - Given prior history of recurrent Candida esophagitis, this is certainly higher on the differential.  Cannot rule out stenosis as well and/or combination -Last ID note from July 2024 recommends for  her to follow-up with GI if she develops recurrent dysphagia.  This appears to be first episode since that visit -Would prefer endoscopic evidence of candidal esophagitis before implementing prolonged course of fluconazole again. She is on trial of PPI and nystatin - GI to re-eval Sunday. If needed, we could also ask for ID input if not wanting to pursue inpatient EGD - CLD diet for now but patient already unable to tolerate liquids and solids prior to admission   History of total cystectomy - hx of IC s/p hemi-kock pouch in 1990 and revision in 1997 - Patient had cystolitholapaxy 11/02/21. She continues to catheterize ~7 times daily using a 16Fr coude tip catheter.  -She continues to drain neobladder well with self-catheterization - CT mentions tubular density within the neobladder.  She was evaluated by urology on admission and is recommended to follow-up with [facility] for foreign body removal but she continues to drain bladder well with coud catheter in the meantime   DMII with CKD-3B: A1c 6.7 in 11/2022.  Reports using Lantus 45 units daily.  Also on metformin and Ozempic. -CBG monitoring -Semglee 25 units daily -SSI-moderate with night coverage.   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  GFR 41.  At baseline -Monitor   History of CAD: No cardiopulmonary symptoms -Continue home meds.    Mild intermittent asthma: Stable   Essential hypertension: BP slightly elevated. -Resume home meds except HCTZ   Obesity: BMI 34.33.  Already on Ozempic. -Encourage lifestyle change to lose weight   Anxiety and depression -Continue home Prozac    Old records reviewed in assessment of this patient  Antimicrobials:   DVT prophylaxis:  enoxaparin (LOVENOX) injection 40 mg Start: 03/15/23 2000   Code  Status:   Code Status: Full Code  Mobility Assessment (Last 72 Hours)     Mobility Assessment     Row Name 03/16/23 0830 03/15/23 2002 03/15/23 1841 03/15/23 1800     Does patient have an order for bedrest or is  patient medically unstable No - Continue assessment No - Continue assessment No - Continue assessment No - Continue assessment    What is the highest level of mobility based on the progressive mobility assessment? Level 6 (Walks independently in room and hall) - Balance while walking in room without assist - Complete Level 6 (Walks independently in room and hall) - Balance while walking in room without assist - Complete Level 6 (Walks independently in room and hall) - Balance while walking in room without assist - Complete Level 6 (Walks independently in room and hall) - Balance while walking in room without assist - Complete             Barriers to discharge: none Disposition Plan:  Home Status is: Obs  Objective: Blood pressure 106/61, pulse 70, temperature 97.8 F (36.6 C), temperature source Oral, resp. rate 18, height 5\' 4"  (1.626 m), weight 90.7 kg, SpO2 97%.  Examination:  Physical Exam Constitutional:      Appearance: Normal appearance.  HENT:     Head: Normocephalic and atraumatic.     Mouth/Throat:     Mouth: Mucous membranes are moist.  Eyes:     Extraocular Movements: Extraocular movements intact.  Cardiovascular:     Rate and Rhythm: Normal rate and regular rhythm.  Pulmonary:     Effort: Pulmonary effort is normal. No respiratory distress.     Breath sounds: Normal breath sounds. No wheezing.  Abdominal:     General: Bowel sounds are normal. There is no distension.     Palpations: Abdomen is soft.     Tenderness: There is no abdominal tenderness.     Comments: Stoma noted in right quadrant for neobladder access  Musculoskeletal:        General: Normal range of motion.     Cervical back: Normal range of motion and neck supple.  Skin:    General: Skin is warm and dry.  Neurological:     General: No focal deficit present.     Mental Status: She is alert.  Psychiatric:        Mood and Affect: Mood normal.      Consultants:  GI  Procedures:    Data  Reviewed: Results for orders placed or performed during the hospital encounter of 03/15/23 (from the past 24 hour(s))  Glucose, capillary     Status: Abnormal   Collection Time: 03/15/23  9:09 PM  Result Value Ref Range   Glucose-Capillary 156 (H) 70 - 99 mg/dL  Basic metabolic panel     Status: Abnormal   Collection Time: 03/16/23  5:38 AM  Result Value Ref Range   Sodium 137 135 - 145 mmol/L   Potassium 3.2 (L) 3.5 - 5.1 mmol/L   Chloride 103 98 - 111 mmol/L   CO2 24 22 - 32 mmol/L   Glucose, Bld 84 70 - 99 mg/dL   BUN 19 8 - 23 mg/dL   Creatinine, Ser 6.21 (H) 0.44 - 1.00 mg/dL   Calcium 9.0 8.9 - 30.8 mg/dL   GFR, Estimated 46 (L) >60 mL/min   Anion gap 10 5 - 15  CBC     Status: None   Collection Time: 03/16/23  5:38 AM  Result  Value Ref Range   WBC 8.9 4.0 - 10.5 K/uL   RBC 3.92 3.87 - 5.11 MIL/uL   Hemoglobin 12.9 12.0 - 15.0 g/dL   HCT 02.7 25.3 - 66.4 %   MCV 100.0 80.0 - 100.0 fL   MCH 32.9 26.0 - 34.0 pg   MCHC 32.9 30.0 - 36.0 g/dL   RDW 40.3 47.4 - 25.9 %   Platelets 350 150 - 400 K/uL   nRBC 0.0 0.0 - 0.2 %  HIV Antibody (routine testing w rflx)     Status: None   Collection Time: 03/16/23  5:38 AM  Result Value Ref Range   HIV Screen 4th Generation wRfx Non Reactive Non Reactive  Hemoglobin A1c     Status: Abnormal   Collection Time: 03/16/23  5:38 AM  Result Value Ref Range   Hgb A1c MFr Bld 8.1 (H) 4.8 - 5.6 %   Mean Plasma Glucose 185.77 mg/dL  Glucose, capillary     Status: None   Collection Time: 03/16/23  7:31 AM  Result Value Ref Range   Glucose-Capillary 95 70 - 99 mg/dL  Glucose, capillary     Status: Abnormal   Collection Time: 03/16/23 12:18 PM  Result Value Ref Range   Glucose-Capillary 171 (H) 70 - 99 mg/dL    I have reviewed pertinent nursing notes, vitals, labs, and images as necessary. I have ordered labwork to follow up on as indicated.  I have reviewed the last notes from staff over past 24 hours. I have discussed patient's care  plan and test results with nursing staff, CM/SW, and other staff as appropriate.  Time spent: Greater than 50% of the 55 minute visit was spent in counseling/coordination of care for the patient as laid out in the A&P.   LOS: 0 days   Lewie Chamber, MD [facility] 03/16/2023, 4:04 PM

## 2023-03-16 NOTE — Consult Note (Signed)
Reason for Consult: A dyne aphasia dysphagia history of Candida Referring Physician: Hospital team  Laurie [name] is an 67 y.o. female.  HPI: Patient seen and examined and her hospital computer chart reviewed including the last endoscopy by [facility] a year and a half ago which showed Candida and she has been having some swallowing problems for a few weeks and her abdomen is a little sore but nothing significant and she sees her [facility] doctors for all her medical issues and her previous endoscopies from 2018 were reviewed as well and she is tolerating liquids and has not been on reflux medicines at home and has had significant esophagitis from reflux in the past  Past Medical History:  Diagnosis Date   Anal fissure    Asthma    B12 deficiency    Candidal esophagitis (HCC)    Carpal tunnel syndrome    Chronic abdominal pain    Chronic UTI    CKD (chronic kidney disease)    Coronary artery disease    Diabetes mellitus without complication (HCC)    Diabetic gastropathy (HCC)    Episodic low back pain    GERD (gastroesophageal reflux disease)    GI bleed    Glaucoma    Hepatitis C    Hypertension    IBS (irritable bowel syndrome)    Interstitial cystitis    Iron deficiency anemia    Myofascial pain syndrome    Obesity (BMI 35.0-39.9 without comorbidity)    Plantar fasciitis     Past Surgical History:  Procedure Laterality Date   ABDOMINAL HYSTERECTOMY     BILATERAL CARPAL TUNNEL RELEASE     BREAST SURGERY     BUBBLE STUDY  02/24/2021   Procedure: BUBBLE STUDY;  Surgeon: Lewayne Bunting, MD;  Location: [facility] ENDOSCOPY;  Service: Cardiovascular;;   CHOLECYSTECTOMY     COLONOSCOPY     ESOPHAGOGASTRODUODENOSCOPY     ESOPHAGOGASTRODUODENOSCOPY (EGD) WITH PROPOFOL N/A 08/22/2016   Procedure: ESOPHAGOGASTRODUODENOSCOPY (EGD) WITH PROPOFOL;  Surgeon: Rachael Fee, MD;  Location: [facility] ENDOSCOPY;  Service: Endoscopy;  Laterality: N/A;   ESOPHAGOGASTRODUODENOSCOPY (EGD) WITH PROPOFOL N/A  02/11/2017   Procedure: ESOPHAGOGASTRODUODENOSCOPY (EGD) WITH PROPOFOL;  Surgeon: Beverley Fiedler, MD;  Location: [facility] ENDOSCOPY;  Service: Gastroenterology;  Laterality: N/A;   INCISION AND DRAINAGE ABSCESS Left 06/25/2015   Procedure: INCISION AND DRAINAGE ABSCESS;  Surgeon: Romie Levee, MD;  Location: [facility] ORS;  Service: General;  Laterality: Left;   IR SINUS/FIST TUBE CHK-NON GI  02/23/2021   LAPAROSCOPY N/A 02/06/2021   Procedure: EXPLORATORY LAPAROTOMY; LYSIS OF ADHESIONS; INCISIONAL HERNIA REPAIR,SMALL BOWEL RESECTION;  Surgeon: Sheliah Hatch De Blanch, MD;  Location: [facility] ORS;  Service: General;  Laterality: N/A;   REVISION UROSTOMY CUTANEOUS     TEE WITHOUT CARDIOVERSION N/A 02/24/2021   Procedure: TRANSESOPHAGEAL ECHOCARDIOGRAM (TEE);  Surgeon: Lewayne Bunting, MD;  Location: [facility] ENDOSCOPY;  Service: Cardiovascular;  Laterality: N/A;    Family History  Problem Relation Age of Onset   CAD Mother    Cancer Father    Stroke Brother     Social History:  reports that she has quit smoking. She has never used smokeless tobacco. She reports that she does not drink alcohol and does not use drugs.  Allergies:  Allergies  Allergen Reactions   Buchu-Cornsilk-Ch Grass-Hydran Other (See Comments)    Dehydration. Elevated creatini Other reaction(s): Other (See Comments) Dehydration, increase in creatinine   Celebrex [Celecoxib] Itching   Ciprofloxacin Other (See Comments)    No history  of rash or SOB. Had kidney problems when she took cipro but was not told she had interstitial nephritis. Other reaction(s): Other (See Comments), Unknown Kidney problems   Cymbalta [Duloxetine Hcl]     GI upset / constipation.    Dilaudid [Hydromorphone Hcl] Itching   Fentanyl And Related Itching   Glucophage [Metformin Hcl] Other (See Comments)    Stopped due to increase creat   Oxycodone Itching   Pregabalin Other (See Comments)    CNS disorder. Headache Other reaction(s): Other (See Comments) Headache,  CNS disorder   Hydrocodone-Acetaminophen Rash   Morphine And Codeine Rash and Itching   Sulfa Antibiotics Rash   Vicodin [Hydrocodone-Acetaminophen] Rash    Medications: I have reviewed the patient's current medications.  Results for orders placed or performed during the hospital encounter of 03/15/23 (from the past 48 hour(s))  CBC with Differential     Status: None   Collection Time: 03/15/23  1:41 PM  Result Value Ref Range   WBC 9.3 4.0 - 10.5 K/uL   RBC 3.96 3.87 - 5.11 MIL/uL   Hemoglobin 12.9 12.0 - 15.0 g/dL   HCT 42.5 95.6 - 38.7 %   MCV 99.2 80.0 - 100.0 fL   MCH 32.6 26.0 - 34.0 pg   MCHC 32.8 30.0 - 36.0 g/dL   RDW 56.4 33.2 - 95.1 %   Platelets 349 150 - 400 K/uL   nRBC 0.0 0.0 - 0.2 %   Neutrophils Relative % 67 %   Neutro Abs 6.2 1.7 - 7.7 K/uL   Lymphocytes Relative 20 %   Lymphs Abs 1.8 0.7 - 4.0 K/uL   Monocytes Relative 10 %   Monocytes Absolute 0.9 0.1 - 1.0 K/uL   Eosinophils Relative 3 %   Eosinophils Absolute 0.3 0.0 - 0.5 K/uL   Basophils Relative 0 %   Basophils Absolute 0.0 0.0 - 0.1 K/uL   Immature Granulocytes 0 %   Abs Immature Granulocytes 0.04 0.00 - 0.07 K/uL    Comment: Performed at [facility], [address] [place], [place] [address]  Comprehensive metabolic panel     Status: Abnormal   Collection Time: 03/15/23  1:41 PM  Result Value Ref Range   Sodium 137 135 - 145 mmol/L   Potassium 3.5 3.5 - 5.1 mmol/L   Chloride 104 98 - 111 mmol/L   CO2 21 (L) 22 - 32 mmol/L   Glucose, Bld 175 (H) 70 - 99 mg/dL    Comment: Glucose reference range applies only to samples taken after fasting for at least 8 hours.   BUN 21 8 - 23 mg/dL   Creatinine, Ser 6.06 (H) 0.44 - 1.00 mg/dL   Calcium 9.3 8.9 - 30.1 mg/dL   Total Protein 7.8 6.5 - 8.1 g/dL   Albumin 3.8 3.5 - 5.0 g/dL   AST 33 15 - 41 U/L   ALT 27 0 - 44 U/L   Alkaline Phosphatase 70 38 - 126 U/L   Total Bilirubin 0.7 <1.2 mg/dL   GFR, Estimated 41 (L) >60 mL/min     Comment: (NOTE) Calculated using the CKD-EPI Creatinine Equation (2021)    Anion gap 12 5 - 15    Comment: Performed at [facility], [address], [place], [place] [address]  Urinalysis, Routine w reflex microscopic -Urine, Clean Catch     Status: Abnormal   Collection Time: 03/15/23  1:58 PM  Result Value Ref Range   Color, Urine YELLOW YELLOW  APPearance CLOUDY (A) CLEAR   Specific Gravity, Urine 1.013 1.005 - 1.030   pH 6.0 5.0 - 8.0   Glucose, UA NEGATIVE NEGATIVE mg/dL   Hgb urine dipstick NEGATIVE NEGATIVE   Bilirubin Urine NEGATIVE NEGATIVE   Ketones, ur NEGATIVE NEGATIVE mg/dL   Protein, ur 952 (A) NEGATIVE mg/dL   Nitrite NEGATIVE NEGATIVE   Leukocytes,Ua TRACE (A) NEGATIVE   RBC / HPF 11-20 0 - 5 RBC/hpf   WBC, UA 21-50 0 - 5 WBC/hpf   Bacteria, UA NONE SEEN NONE SEEN   Squamous Epithelial / HPF 0-5 0 - 5 /HPF   Mucus PRESENT    Hyaline Casts, UA PRESENT    Amorphous Crystal PRESENT     Comment: Performed at [facility], [address] [place], [place] [address]  Glucose, capillary     Status: Abnormal   Collection Time: 03/15/23  9:09 PM  Result Value Ref Range   Glucose-Capillary 156 (H) 70 - 99 mg/dL    Comment: Glucose reference range applies only to samples taken after fasting for at least 8 hours.  Basic metabolic panel     Status: Abnormal   Collection Time: 03/16/23  5:38 AM  Result Value Ref Range   Sodium 137 135 - 145 mmol/L   Potassium 3.2 (L) 3.5 - 5.1 mmol/L   Chloride 103 98 - 111 mmol/L   CO2 24 22 - 32 mmol/L   Glucose, Bld 84 70 - 99 mg/dL    Comment: Glucose reference range applies only to samples taken after fasting for at least 8 hours.   BUN 19 8 - 23 mg/dL   Creatinine, Ser 4.40 (H) 0.44 - 1.00 mg/dL   Calcium 9.0 8.9 - 10.2 mg/dL   GFR, Estimated 46 (L) >60 mL/min    Comment: (NOTE) Calculated using the CKD-EPI Creatinine Equation (2021)    Anion gap 10 5 - 15    Comment: Performed at  [facility], [address] [place], [place] [address]  CBC     Status: None   Collection Time: 03/16/23  5:38 AM  Result Value Ref Range   WBC 8.9 4.0 - 10.5 K/uL   RBC 3.92 3.87 - 5.11 MIL/uL   Hemoglobin 12.9 12.0 - 15.0 g/dL   HCT 64.4 03.4 - 74.2 %   MCV 100.0 80.0 - 100.0 fL   MCH 32.9 26.0 - 34.0 pg   MCHC 32.9 30.0 - 36.0 g/dL   RDW 59.5 63.8 - 75.6 %   Platelets 350 150 - 400 K/uL   nRBC 0.0 0.0 - 0.2 %    Comment: Performed at [facility], [address] [place], [place] [address]  Hemoglobin A1c     Status: Abnormal   Collection Time: 03/16/23  5:38 AM  Result Value Ref Range   Hgb A1c MFr Bld 8.1 (H) 4.8 - 5.6 %    Comment: (NOTE) Pre diabetes:          5.7%-6.4%  Diabetes:              >6.4%  Glycemic control for   <7.0% adults with diabetes    Mean Plasma Glucose 185.77 mg/dL    Comment: Performed at [facility], [address], [place], [place] [address]  Glucose, capillary     Status: None   Collection Time: 03/16/23  7:31 AM  Result Value Ref Range   Glucose-Capillary 95 70 - 99 mg/dL    Comment:  Glucose reference range applies only to samples taken after fasting for at least 8 hours.    CT ABDOMEN PELVIS W CONTRAST  Result Date: 03/15/2023 CLINICAL DATA:  Lower quadrant pain previous bladder resection EXAM: CT ABDOMEN AND PELVIS WITH CONTRAST TECHNIQUE: Multidetector CT imaging of the abdomen and pelvis was performed using the standard protocol following bolus administration of intravenous contrast. RADIATION DOSE REDUCTION: This exam was performed according to the departmental dose-optimization program which includes automated exposure control, adjustment of the mA and/or kV according to patient size and/or use of iterative reconstruction technique. CONTRAST:  OMNIPAQUE IOHEXOL 300 MG/ML  SOLN COMPARISON:  CT 12/17/2022, 02/16/2021 FINDINGS: Lower chest: Lung bases demonstrate mild dependent atelectasis  and minimal subpleural fibrosis. No acute airspace disease. Hepatobiliary: Cholecystectomy. No biliary dilatation or focal hepatic abnormality Pancreas: Unremarkable. No pancreatic ductal dilatation or surrounding inflammatory changes. Spleen: Normal in size without focal abnormality. Adrenals/Urinary Tract: Thickened adrenal glands without dominant nodule, no change. Kidneys show no hydronephrosis. Status post cystectomy with right lower quadrant urinary diversion. There is a tubular density within the neobladder, previously this was visualized within the subcutaneous soft tissues of the right abdominal ostomy. Stomach/Bowel: Decompressed stomach. No dilated small bowel. Status post right hemicolectomy with ileal colic anastomosis in the right upper quadrant. No obstruction. No acute bowel wall thickening. Diverticular disease of the sigmoid colon without acute inflammatory changes. Multiple small bowel loops abutting the anterior peritoneal cavity possibly due to adhesive disease but no obstructive changes. Vascular/Lymphatic: Moderate aortic atherosclerosis. No aneurysm. No suspicious lymph nodes. Reproductive: Hysterectomy.  No adnexal mass. Other: Negative for pelvic effusion or free air. Skin thickening and subcutaneous infiltration of the left abdominal wall, no change. Evidence of prior umbilical hernia repair. Musculoskeletal: No acute or suspicious osseous abnormality. IMPRESSION: 1. Status post cystectomy with right lower quadrant urinary diversion. There is a tubular density within the neobladder, previously this was visualized within the subcutaneous soft tissues of the right abdominal ostomy, this may represent a catheter fragment or stent. Correlate with the patient's surgical/procedural history. 2. Status post right hemicolectomy with ileocolic anastomosis in the right upper quadrant. No obstruction. 3. Diverticular disease of the sigmoid colon without acute inflammatory changes. 4. Aortic  atherosclerosis. Aortic Atherosclerosis (ICD10-I70.0). Electronically Signed   By: Jasmine Pang M.D.   On: 03/15/2023 16:50   DG Chest Portable 1 View  Result Date: 03/15/2023 CLINICAL DATA:  Dysphagia.  Right-sided flank pain. EXAM: PORTABLE CHEST 1 VIEW COMPARISON:  02/18/2021. FINDINGS: Bilateral lung fields are clear. No acute consolidation or lung collapse. No pulmonary edema. Redemonstration of elevated right hemidiaphragm. Bilateral costophrenic angles are clear. Normal cardio-mediastinal silhouette. No acute osseous abnormalities. The soft tissues are within normal limits. IMPRESSION: No active disease. Electronically Signed   By: Jules Schick M.D.   On: 03/15/2023 14:52    ROS negative except above Blood pressure 127/78, pulse 72, temperature 98.4 F (36.9 C), temperature source Oral, resp. rate 18, height 5\' 4"  (1.626 m), weight 90.7 kg, SpO2 98%. Physical Exam vital signs stable afebrile no acute distress abdomen is soft nontender CT reviewed labs okay  Assessment/Plan: Patient with multiple medical problems including odynophagia and dysphagia Plan: Will allow clear liquids and treat medically for now and will begin Protonix and nystatin swish and swallow and you can consider fluconazole use as well and we will check on tomorrow and decide how to proceed please call me sooner if any GI question or problem  Karlos Scadden E 03/16/2023, 10:46 AM

## 2023-03-16 NOTE — Assessment & Plan Note (Signed)
-   Given prior history of recurrent Candida esophagitis, this is certainly higher on the differential.  Cannot rule out stenosis as well and/or combination -Last ID note from July 2024 recommends for her to follow-up with GI if she develops recurrent dysphagia.  This appears to be first episode since that visit -Would prefer endoscopic evidence of candidal esophagitis before implementing prolonged course of fluconazole again. She is on trial of PPI and nystatin -EGD performed 03/19/2023 revealing intrinsic mild stenosis and underwent dilation with improvement in dysphagia after procedure -Continued on Protonix at discharge - Fluconazole was discontinued as no evidence of candida during EGD

## 2023-03-16 NOTE — Plan of Care (Signed)
  Problem: Education: Goal: Knowledge of General Education information will improve Description: Including pain rating scale, medication(s)/side effects and non-pharmacologic comfort measures Outcome: Progressing   Problem: Health Behavior/Discharge Planning: Goal: Ability to manage health-related needs will improve Outcome: Progressing   Problem: Clinical Measurements: Goal: Ability to maintain clinical measurements within normal limits will improve Outcome: Progressing Goal: Will remain free from infection Outcome: Progressing Goal: Diagnostic test results will improve Outcome: Progressing Goal: Respiratory complications will improve Outcome: Progressing Goal: Cardiovascular complication will be avoided Outcome: Progressing   Problem: Activity: Goal: Risk for activity intolerance will decrease Outcome: Progressing   Problem: Nutrition: Goal: Adequate nutrition will be maintained Outcome: Progressing   Problem: Elimination: Goal: Will not experience complications related to bowel motility Outcome: Progressing Goal: Will not experience complications related to urinary retention Outcome: Progressing   Problem: Pain Management: Goal: General experience of comfort will improve Outcome: Progressing   Problem: Safety: Goal: Ability to remain free from injury will improve Outcome: Progressing   Problem: Skin Integrity: Goal: Risk for impaired skin integrity will decrease Outcome: Progressing   Problem: Education: Goal: Ability to describe self-care measures that may prevent or decrease complications (Diabetes Survival Skills Education) will improve Outcome: Progressing   Problem: Fluid Volume: Goal: Ability to maintain a balanced intake and output will improve Outcome: Progressing   Problem: Health Behavior/Discharge Planning: Goal: Ability to identify and utilize available resources and services will improve Outcome: Progressing Goal: Ability to manage  health-related needs will improve Outcome: Progressing   Problem: Health Behavior/Discharge Planning: Goal: Ability to manage health-related needs will improve Outcome: Progressing   Problem: Skin Integrity: Goal: Risk for impaired skin integrity will decrease Outcome: Progressing   Problem: Tissue Perfusion: Goal: Adequacy of tissue perfusion will improve Outcome: Progressing

## 2023-03-16 NOTE — Assessment & Plan Note (Signed)
-   hx of IC s/p hemi-kock pouch in 1990 and revision in 1997 - Patient had cystolitholapaxy 11/02/21. She continues to catheterize ~7 times daily using a 16Fr coude tip catheter.  -She continues to drain neobladder well with self-catheterization - CT mentions tubular density within the neobladder.  She was evaluated by urology on admission and is recommended to follow-up with [facility] for foreign body removal but she continues to drain bladder well with coud catheter in the meantime

## 2023-03-16 NOTE — Hospital Course (Addendum)
Laurie [name] is a 67 yo female with extensive PMH including recurrent candidal esophagitis, multiple prior surgeries for chronic abdomino-pelvic pain/interstitial cystitis (including prior cystectomy with cystoplasty 1985, later take-down of cystoplasty with ureteral diversion 1991), TAH/SPO in 1996, cholecystectomy, lysis of adhesions.  Patient had cystolitholapaxy 11/02/21. She continues to catheterize ~7 times daily using a 16Fr coude tip catheter.  IC s/p hemi-kock pouch in 1990 and revision in 1997. Other PMH includes DMII, anxiety/depression, GERD, obesity, HCV s/p treatment.  She presents to the hospital due to progressively worsening dysphagia to the point of not tolerating liquids. She was recently seen by ID on 11/05/2022 and was recommended to contact GI to repeat EGD if dysphagia recurred.  This appears to be her first recurrence of dysphagia since that appointment. Last documented episode of candidal esophagitis was 10/04/2021 which was her sixth episode.  She underwent CT abdomen/pelvis for further workup.  Prior right hemicolectomy with ileocolonic anastomosis noted with no signs of obstruction.  Prior cystectomy noted with tubular density within the neobladder.  She was admitted for further workup of her dysphagia and GI was consulted on admission. She did not improve with supportive care after admission and underwent EGD with GI on 03/19/2023.  She was found to have benign-appearing intrinsic mild stenosis versus spastic GE junction and underwent dilation with balloon dilator.  She had improvement in her symptoms after dilation and diet was advanced.  She was continued on Protonix at discharge.  Due to no evidence of Candida, fluconazole was discontinued.

## 2023-03-17 DIAGNOSIS — Z906 Acquired absence of other parts of urinary tract: Secondary | ICD-10-CM | POA: Diagnosis not present

## 2023-03-17 DIAGNOSIS — R1312 Dysphagia, oropharyngeal phase: Secondary | ICD-10-CM | POA: Diagnosis not present

## 2023-03-17 LAB — GLUCOSE, CAPILLARY
Glucose-Capillary: 122 mg/dL — ABNORMAL HIGH (ref 70–99)
Glucose-Capillary: 129 mg/dL — ABNORMAL HIGH (ref 70–99)
Glucose-Capillary: 108 mg/dL — ABNORMAL HIGH (ref 70–99)
Glucose-Capillary: 101 mg/dL — ABNORMAL HIGH (ref 70–99)

## 2023-03-17 LAB — BASIC METABOLIC PANEL
Anion gap: 10 (ref 5–15)
BUN: 18 mg/dL (ref 8–23)
CO2: 23 mmol/L (ref 22–32)
Calcium: 9 mg/dL (ref 8.9–10.3)
Chloride: 104 mmol/L (ref 98–111)
Creatinine, Ser: 1.49 mg/dL — ABNORMAL HIGH (ref 0.44–1.00)
GFR, Estimated: 38 mL/min — ABNORMAL LOW (ref >60)
Glucose, Bld: 107 mg/dL — ABNORMAL HIGH (ref 70–99)
Potassium: 3.6 mmol/L (ref 3.5–5.1)
Sodium: 137 mmol/L (ref 135–145)

## 2023-03-17 LAB — MAGNESIUM: Magnesium: 2 mg/dL (ref 1.7–2.4)

## 2023-03-17 NOTE — Plan of Care (Signed)

## 2023-03-17 NOTE — Plan of Care (Signed)
  Problem: Education: Goal: Knowledge of General Education information will improve Description: Including pain rating scale, medication(s)/side effects and non-pharmacologic comfort measures Outcome: Progressing   Problem: Health Behavior/Discharge Planning: Goal: Ability to manage health-related needs will improve Outcome: Progressing   Problem: Clinical Measurements: Goal: Ability to maintain clinical measurements within normal limits will improve Outcome: Progressing Goal: Will remain free from infection Outcome: Progressing Goal: Diagnostic test results will improve Outcome: Progressing Goal: Respiratory complications will improve Outcome: Progressing Goal: Cardiovascular complication will be avoided Outcome: Progressing   Problem: Activity: Goal: Risk for activity intolerance will decrease Outcome: Progressing   Problem: Nutrition: Goal: Adequate nutrition will be maintained Outcome: Progressing   Problem: Coping: Goal: Level of anxiety will decrease Outcome: Progressing   Problem: Elimination: Goal: Will not experience complications related to bowel motility Outcome: Progressing Goal: Will not experience complications related to urinary retention Outcome: Progressing   Problem: Pain Management: Goal: General experience of comfort will improve Outcome: Progressing   Problem: Safety: Goal: Ability to remain free from injury will improve Outcome: Progressing   Problem: Skin Integrity: Goal: Risk for impaired skin integrity will decrease Outcome: Progressing   Problem: Education: Goal: Ability to describe self-care measures that may prevent or decrease complications (Diabetes Survival Skills Education) will improve Outcome: Progressing Goal: Individualized Educational Video(s) Outcome: Progressing   Problem: Coping: Goal: Ability to adjust to condition or change in health will improve Outcome: Progressing   Problem: Fluid Volume: Goal: Ability to  maintain a balanced intake and output will improve Outcome: Progressing   Problem: Health Behavior/Discharge Planning: Goal: Ability to identify and utilize available resources and services will improve Outcome: Progressing Goal: Ability to manage health-related needs will improve Outcome: Progressing   Problem: Nutritional: Goal: Maintenance of adequate nutrition will improve Outcome: Progressing Goal: Progress toward achieving an optimal weight will improve Outcome: Progressing   Problem: Skin Integrity: Goal: Risk for impaired skin integrity will decrease Outcome: Progressing   Problem: Tissue Perfusion: Goal: Adequacy of tissue perfusion will improve Outcome: Progressing

## 2023-03-17 NOTE — Progress Notes (Signed)
Laurie [name] 8:51 AM  Subjective: Patient doing about the same tolerating clear liquids but does agree to having full liquids and we discussed swallowing the nystatin swish and swallow as compared to swish and spit she has no new complaints  Objective: Vital signs stable afebrile no acute distress patient looks well not examined today chemistry is okay  Assessment: Problem swallowing history of significant esophagitis and Candida  Plan: Consider adding fluconazole continue present management last rounding partner to check on tomorrow and determine if another endoscopy is needed  [facility] E  office [phone] After 5PM or if no answer call [phone]

## 2023-03-17 NOTE — Evaluation (Signed)
Clinical/Bedside Swallow Evaluation Patient Details  Name: Laurie [name] MRN: [id number] Date of Birth: Sep 08, 1955  Today's Date: 03/17/2023 Time: SLP Start Time (ACUTE ONLY): 0827 SLP Stop Time (ACUTE ONLY): 0842 SLP Time Calculation (min) (ACUTE ONLY): 15 min  Past Medical History:  Past Medical History:  Diagnosis Date   Anal fissure    Asthma    B12 deficiency    Candidal esophagitis (HCC)    Carpal tunnel syndrome    Chronic abdominal pain    Chronic UTI    CKD (chronic kidney disease)    Coronary artery disease    Diabetes mellitus without complication (HCC)    Diabetic gastropathy (HCC)    Episodic low back pain    GERD (gastroesophageal reflux disease)    GI bleed    Glaucoma    Hepatitis C    Hypertension    IBS (irritable bowel syndrome)    Interstitial cystitis    Iron deficiency anemia    Myofascial pain syndrome    Obesity (BMI 35.0-39.9 without comorbidity)    Plantar fasciitis    Past Surgical History:  Past Surgical History:  Procedure Laterality Date   ABDOMINAL HYSTERECTOMY     BILATERAL CARPAL TUNNEL RELEASE     BREAST SURGERY     BUBBLE STUDY  02/24/2021   Procedure: BUBBLE STUDY;  Surgeon: Lewayne Bunting, MD;  Location: [facility] ENDOSCOPY;  Service: Cardiovascular;;   CHOLECYSTECTOMY     COLONOSCOPY     ESOPHAGOGASTRODUODENOSCOPY     ESOPHAGOGASTRODUODENOSCOPY (EGD) WITH PROPOFOL N/A 08/22/2016   Procedure: ESOPHAGOGASTRODUODENOSCOPY (EGD) WITH PROPOFOL;  Surgeon: Rachael Fee, MD;  Location: [facility] ENDOSCOPY;  Service: Endoscopy;  Laterality: N/A;   ESOPHAGOGASTRODUODENOSCOPY (EGD) WITH PROPOFOL N/A 02/11/2017   Procedure: ESOPHAGOGASTRODUODENOSCOPY (EGD) WITH PROPOFOL;  Surgeon: Beverley Fiedler, MD;  Location: [facility] ENDOSCOPY;  Service: Gastroenterology;  Laterality: N/A;   INCISION AND DRAINAGE ABSCESS Left 06/25/2015   Procedure: INCISION AND DRAINAGE ABSCESS;  Surgeon: Romie Levee, MD;  Location: [facility] ORS;  Service: General;  Laterality: Left;    IR SINUS/FIST TUBE CHK-NON GI  02/23/2021   LAPAROSCOPY N/A 02/06/2021   Procedure: EXPLORATORY LAPAROTOMY; LYSIS OF ADHESIONS; INCISIONAL HERNIA REPAIR,SMALL BOWEL RESECTION;  Surgeon: Sheliah Hatch De Blanch, MD;  Location: [facility] ORS;  Service: General;  Laterality: N/A;   REVISION UROSTOMY CUTANEOUS     TEE WITHOUT CARDIOVERSION N/A 02/24/2021   Procedure: TRANSESOPHAGEAL ECHOCARDIOGRAM (TEE);  Surgeon: Lewayne Bunting, MD;  Location: [facility] ENDOSCOPY;  Service: Cardiovascular;  Laterality: N/A;   HPI:  Laurie [name] is a 67 yo female presenting to ED 12/6 with worsening dysphagia and odynophagia as well as medial abdomen pain with associated n/v. Recently seen 11/05/22 with recommendations to contact GI for repeat EGD if dysphagia recurred. GI recommended clear liquids and initiation of Protonix with further f/u for decisions regarding EGD. Last candida esophagitis diagnosis 09/14/21, which was her sixth episode. 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 She has been unable to swallow thin liquids or solids. Oral motor exam WFL. Pt placed on clear liquid diet per GI so SLP assessed pt's oropharyngeal swallow with trials of thin liquids and jello without overt s/s of aspiration. Pt reports subjective difficulty swallowing due to a "lump in her throat". Pt's presentation this date may be representative of candida  and an esophageal component. Recommend continuing diet per MD discretion and upgrading to solids as tolerated. SLP will follow to further assess potential for diet upgrade and provide education as able.  SLP Visit Diagnosis: Dysphagia, unspecified (R13.10)    Aspiration Risk  Mild aspiration risk    Diet Recommendation Thin liquid     Liquid Administration via: Straw;Cup;Spoon Medication Administration: Crushed with puree Supervision: Patient able to self feed Compensations: Minimize environmental distractions;Slow rate;Small sips/bites Postural Changes: Seated upright at 90 degrees;Remain upright for at least 30 minutes after po intake    Other  Recommendations Oral Care Recommendations: Oral care BID    Recommendations for follow up therapy are one component of a multi-disciplinary discharge planning process, led by the attending physician.  Recommendations may be updated based on patient status, additional functional criteria and insurance authorization.  Follow up Recommendations No SLP follow up      Assistance Recommended at Discharge    Functional Status Assessment Patient has had a recent decline in their functional status and demonstrates the ability to make significant improvements in function in a reasonable and predictable amount of time.  Frequency and Duration min 2x/week  1 week       Prognosis Prognosis for improved oropharyngeal function: Good Barriers to Reach Goals: Time post onset      Swallow Study   General HPI: Laurie [name] is a 67 yo female presenting to ED 12/6 with worsening dysphagia and odynophagia as well as medial abdomen pain with associated n/v. Recently seen 11/05/22 with recommendations to contact GI for repeat EGD if dysphagia recurred. GI recommended clear liquids and initiation of Protonix with further f/u for decisions regarding EGD. Last candida esophagitis diagnosis 09/14/21, which was her sixth episode. 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, depression, GERD, recurrent candidal esophagitis, dysphagia Type of Study: Bedside Swallow Evaluation Previous Swallow Assessment: none in chart Diet Prior to this Study: Clear liquid diet Temperature Spikes Noted: No Respiratory Status: Room air History of  Recent Intubation: No Behavior/Cognition: Alert;Cooperative;Pleasant mood Oral Cavity Assessment: Within Functional Limits Oral Care Completed by SLP: No Oral Cavity - Dentition: Dentures, top;Dentures, bottom Vision: Functional for self-feeding Self-Feeding Abilities: Able to feed self Patient Positioning: Upright in bed Baseline Vocal Quality: Normal Volitional Cough: Strong Volitional Swallow: Able to elicit    Oral/Motor/Sensory Function Overall Oral Motor/Sensory Function: Within functional limits   Ice Chips Ice chips: Not tested   Thin Liquid Thin Liquid: Within functional limits Presentation: Straw;Self Fed    Nectar Thick Nectar Thick Liquid: Not tested   Honey Thick Honey Thick Liquid: Not tested   Puree Puree: Within functional limits Presentation: Spoon;Self Fed   Solid     Solid: Not tested      Gwynneth Aliment, M.A., CF-SLP Speech Language Pathology, Acute Rehabilitation Services  Secure Chat preferred [phone]  03/17/2023,8:51 AM

## 2023-03-17 NOTE — Assessment & Plan Note (Signed)
-   Continue amlodipine, Imdur, lisinopril-hct, Toprol

## 2023-03-17 NOTE — Assessment & Plan Note (Signed)
Resume home regimen. 

## 2023-03-17 NOTE — Progress Notes (Signed)
Progress Note    Laurie [name]   ZHY:[id number]  DOB: 03-Aug-1955  DOA: 03/15/2023     0 PCP: Margit Hanks, MD  Initial CC: dysphagia  Hospital Course: Laurie [name] is a 67 yo female with extensive PMH including recurrent candidal esophagitis, multiple prior surgeries for chronic abdomino-pelvic pain/interstitial cystitis (including prior cystectomy with cystoplasty 1985, later take-down of cystoplasty with ureteral diversion 1991), TAH/SPO in 1996, cholecystectomy, lysis of adhesions.  Patient had cystolitholapaxy 11/02/21. She continues to catheterize ~7 times daily using a 16Fr coude tip catheter.  IC s/p hemi-kock pouch in 1990 and revision in 1997. Other PMH includes DMII, anxiety/depression, GERD, obesity, HCV s/p treatment.  She presents to the hospital due to progressively worsening dysphagia to the point of not tolerating liquids. She was recently seen by ID on 11/05/2022 and was recommended to contact GI to repeat EGD if dysphagia recurred.  This appears to be her first recurrence of dysphagia since that appointment. Last documented episode of candidal esophagitis was 10/04/2021 which was her sixth episode.  She underwent CT abdomen/pelvis for further workup.  Prior right hemicolectomy with ileocolonic anastomosis noted with no signs of obstruction.  Prior cystectomy noted with tubular density within the neobladder.  She was admitted for further workup of her dysphagia and GI was consulted on admission.  Interval History:  No issues overnight. She seems to still desire an EGD in hopes that it helps her swallow better again.   Assessment and Plan: * Dysphagia - Given prior history of recurrent Candida esophagitis, this is certainly higher on the differential.  Cannot rule out stenosis as well and/or combination -Last ID note from July 2024 recommends for her to follow-up with GI if she develops recurrent dysphagia.  This appears to be first episode since that  visit -Would prefer endoscopic evidence of candidal esophagitis before implementing prolonged course of fluconazole again. She is on trial of PPI and nystatin - GI to re-eval Monday.  - FLD today then NPO at MN in case of procedure tomorrow  History of total cystectomy - hx of IC s/p hemi-kock pouch in 1990 and revision in 1997 - Patient had cystolitholapaxy 11/02/21. She continues to catheterize ~7 times daily using a 16Fr coude tip catheter.  -She continues to drain neobladder well with self-catheterization - CT mentions tubular density within the neobladder.  She was evaluated by urology on admission and is recommended to follow-up with [facility] for foreign body removal but she continues to drain bladder well with coud catheter in the meantime  Diabetes mellitus without complication (HCC) - Continue SSI and CBG monitoring  Essential hypertension - Continue amlodipine, Imdur, lisinopril-hct, Toprol  CAD (coronary artery disease) - Continue Crestor, lisinopril, Toprol    CKD-3B: Cr 1.4.  GFR 41.  At baseline -Monitor   Mild intermittent asthma: Stable   Obesity: BMI 34.33.  Already on Ozempic. -Encourage lifestyle change to lose weight   Anxiety and depression -Continue home Prozac    Old records reviewed in assessment of this patient  Antimicrobials:   DVT prophylaxis:  enoxaparin (LOVENOX) injection 40 mg Start: 03/15/23 2000   Code Status:   Code Status: Full Code  Mobility Assessment (Last 72 Hours)     Mobility Assessment     Row Name 03/17/23 1100 03/16/23 1953 03/16/23 0830 03/15/23 2002 03/15/23 1841   Does patient have an order for bedrest or is patient medically unstable No - Continue assessment No - Continue assessment No - Continue assessment No - Continue  assessment No - Continue assessment   What is the highest level of mobility based on the progressive mobility assessment? Level 6 (Walks independently in room and hall) - Balance while walking in room  without assist - Complete Level 6 (Walks independently in room and hall) - Balance while walking in room without assist - Complete Level 6 (Walks independently in room and hall) - Balance while walking in room without assist - Complete Level 6 (Walks independently in room and hall) - Balance while walking in room without assist - Complete Level 6 (Walks independently in room and hall) - Balance while walking in room without assist - Complete    Row Name 03/15/23 1800           Does patient have an order for bedrest or is patient medically unstable No - Continue assessment       What is the highest level of mobility based on the progressive mobility assessment? Level 6 (Walks independently in room and hall) - Balance while walking in room without assist - Complete                Barriers to discharge: none Disposition Plan:  Home Status is: Obs  Objective: Blood pressure 121/68, pulse 74, temperature 98.2 F (36.8 C), temperature source Oral, resp. rate 17, height 5\' 4"  (1.626 m), weight 90.7 kg, SpO2 99%.  Examination:  Physical Exam Constitutional:      Appearance: Normal appearance.  HENT:     Head: Normocephalic and atraumatic.     Mouth/Throat:     Mouth: Mucous membranes are moist.  Eyes:     Extraocular Movements: Extraocular movements intact.  Cardiovascular:     Rate and Rhythm: Normal rate and regular rhythm.  Pulmonary:     Effort: Pulmonary effort is normal. No respiratory distress.     Breath sounds: Normal breath sounds. No wheezing.  Abdominal:     General: Bowel sounds are normal. There is no distension.     Palpations: Abdomen is soft.     Tenderness: There is no abdominal tenderness.     Comments: Stoma noted in right quadrant for neobladder access  Musculoskeletal:        General: Normal range of motion.     Cervical back: Normal range of motion and neck supple.  Skin:    General: Skin is warm and dry.  Neurological:     General: No focal deficit  present.     Mental Status: She is alert.  Psychiatric:        Mood and Affect: Mood normal.      Consultants:  GI  Procedures:    Data Reviewed: Results for orders placed or performed during the hospital encounter of 03/15/23 (from the past 24 hour(s))  Glucose, capillary     Status: None   Collection Time: 03/16/23  4:08 PM  Result Value Ref Range   Glucose-Capillary 91 70 - 99 mg/dL  Glucose, capillary     Status: Abnormal   Collection Time: 03/16/23  9:18 PM  Result Value Ref Range   Glucose-Capillary 162 (H) 70 - 99 mg/dL  Basic metabolic panel     Status: Abnormal   Collection Time: 03/17/23  5:54 AM  Result Value Ref Range   Sodium 137 135 - 145 mmol/L   Potassium 3.6 3.5 - 5.1 mmol/L   Chloride 104 98 - 111 mmol/L   CO2 23 22 - 32 mmol/L   Glucose, Bld 107 (H) 70 - 99 mg/dL  BUN 18 8 - 23 mg/dL   Creatinine, Ser 4.09 (H) 0.44 - 1.00 mg/dL   Calcium 9.0 8.9 - 81.1 mg/dL   GFR, Estimated 38 (L) >60 mL/min   Anion gap 10 5 - 15  Magnesium     Status: None   Collection Time: 03/17/23  5:54 AM  Result Value Ref Range   Magnesium 2.0 1.7 - 2.4 mg/dL  Glucose, capillary     Status: Abnormal   Collection Time: 03/17/23  7:34 AM  Result Value Ref Range   Glucose-Capillary 122 (H) 70 - 99 mg/dL   Comment 1 Notify RN    Comment 2 Document in Chart   Glucose, capillary     Status: Abnormal   Collection Time: 03/17/23 11:27 AM  Result Value Ref Range   Glucose-Capillary 129 (H) 70 - 99 mg/dL   Comment 1 Notify RN    Comment 2 Document in Chart     I have reviewed pertinent nursing notes, vitals, labs, and images as necessary. I have ordered labwork to follow up on as indicated.  I have reviewed the last notes from staff over past 24 hours. I have discussed patient's care plan and test results with nursing staff, CM/SW, and other staff as appropriate.  Time spent: Greater than 50% of the 55 minute visit was spent in counseling/coordination of care for the patient  as laid out in the A&P.   LOS: 0 days   Lewie Chamber, MD [facility] 03/17/2023, 3:22 PM

## 2023-03-17 NOTE — Assessment & Plan Note (Signed)
-   Continue Crestor, lisinopril, Toprol

## 2023-03-18 DIAGNOSIS — R1312 Dysphagia, oropharyngeal phase: Secondary | ICD-10-CM | POA: Diagnosis not present

## 2023-03-18 LAB — BASIC METABOLIC PANEL
Anion gap: 10 (ref 5–15)
BUN: 17 mg/dL (ref 8–23)
CO2: 22 mmol/L (ref 22–32)
Calcium: 9.3 mg/dL (ref 8.9–10.3)
Chloride: 105 mmol/L (ref 98–111)
Creatinine, Ser: 1.47 mg/dL — ABNORMAL HIGH (ref 0.44–1.00)
GFR, Estimated: 39 mL/min — ABNORMAL LOW (ref >60)
Glucose, Bld: 75 mg/dL (ref 70–99)
Potassium: 3.6 mmol/L (ref 3.5–5.1)
Sodium: 137 mmol/L (ref 135–145)

## 2023-03-18 LAB — GLUCOSE, CAPILLARY
Glucose-Capillary: 78 mg/dL (ref 70–99)
Glucose-Capillary: 142 mg/dL — ABNORMAL HIGH (ref 70–99)
Glucose-Capillary: 90 mg/dL (ref 70–99)
Glucose-Capillary: 157 mg/dL — ABNORMAL HIGH (ref 70–99)

## 2023-03-18 LAB — MAGNESIUM: Magnesium: 2 mg/dL (ref 1.7–2.4)

## 2023-03-18 MED ORDER — FLUCONAZOLE 100 MG PO TABS
100.0000 mg | ORAL_TABLET | Freq: Every day | ORAL | Status: DC
  Administered 2023-03-18: 100 mg via ORAL
  Filled 2023-03-18: qty 1

## 2023-03-18 MED ORDER — FLUCONAZOLE 100 MG PO TABS
200.0000 mg | ORAL_TABLET | Freq: Every day | ORAL | Status: DC
Start: 2023-03-18 — End: 2023-03-18

## 2023-03-18 NOTE — Plan of Care (Signed)

## 2023-03-18 NOTE — Progress Notes (Signed)
Mobility Specialist - Progress Note   03/18/23 0954  Mobility  Activity Transferred from bed to chair  Level of Assistance Modified independent, requires aide device or extra time  Activity Response Tolerated well  Mobility Referral Yes  Mobility visit 1 Mobility  Mobility Specialist Start Time (ACUTE ONLY) 0944  Mobility Specialist Stop Time (ACUTE ONLY) 0954  Mobility Specialist Time Calculation (min) (ACUTE ONLY) 10 min   Pt received in bed declining mobility but agreeable to transfer to recliner. No complaints during session. Pt to recliner after session with all needs met.    [facility]

## 2023-03-18 NOTE — TOC CM/SW Note (Addendum)
Transition of Care [facility]) - Inpatient Brief Assessment   Patient Details  Name: Laurie [name] MRN: [id number] Date of Birth: June 12, 1955  Transition of [facility] CM/SW Contact:    Darleene Cleaver, LCSW Phone Number: 03/18/2023, 5:58 PM   Clinical Narrative:  Patient lives alone, does not have any SDOH needs.  Patient has insurance and also a PCP.  No anticipated TOC needs.  Transition of Care Asessment: Insurance and Status: Insurance coverage has been reviewed Patient has primary care physician: Yes Home environment has been reviewed: Single family home alone Prior level of function:: Independent Prior/Current Home Services: No current home services Social Determinants of Health Reivew: SDOH reviewed no interventions necessary Readmission risk has been reviewed: Yes Transition of care needs: no transition of care needs at this time

## 2023-03-18 NOTE — Progress Notes (Signed)
[facility] Progress Note  Laurie [name] 68 y.o. 08-01-1955   Subjective: Continues to have pain with swallowing and unable to tolerate any diet beyond full liquids. Denies abdominal pain.  Objective: Vital signs: Vitals:   03/18/23 0948 03/18/23 1316  BP: 136/70 110/63  Pulse:  68  Resp:  18  Temp:  97.8 F (36.6 C)  SpO2:  99%    Physical Exam: Gen: lethargic, elderly, obese, no acute distress, pleasant HEENT: anicteric sclera CV: RRR Chest: CTA B Abd: soft, nontender, nondistended, +BS Ext: no edema  Lab Results: Recent Labs    03/17/23 0554 03/18/23 0516  NA 137 137  K 3.6 3.6  CL 104 105  CO2 23 22  GLUCOSE 107* 75  BUN 18 17  CREATININE 1.49* 1.47*  CALCIUM 9.0 9.3  MG 2.0 2.0   No results for input(s): "AST", "ALT", "ALKPHOS", "BILITOT", "PROT", "ALBUMIN" in the last 72 hours. Recent Labs    03/16/23 0538  WBC 8.9  HGB 12.9  HCT 39.2  MCV 100.0  PLT 350      Assessment/Plan: Dysphagia with history of recurrent esophageal candidiasis. Fluconazole added today. EGD with possible dilation tomorrow morning by Dr. Ewing Schlein. Continue supportive care. NPO p MN.   [name] 03/18/2023, 4:03 PM  Questions please call [phone] ID: Laurie [name], female   DOB: 1955/05/29, 67 y.o.   MRN: [id number]

## 2023-03-18 NOTE — Progress Notes (Signed)
Progress Note    Laurie [name]   NWG:[id number]  DOB: [birth year]  DOA: 03/15/2023     0 PCP: Laurie Hanks, MD  Initial CC: dysphagia  Hospital Course: Ms. Laurie [name] is a 67 yo female with extensive PMH including recurrent candidal esophagitis, multiple prior surgeries for chronic abdomino-pelvic pain/interstitial cystitis (including prior cystectomy with cystoplasty 1985, later take-down of cystoplasty with ureteral diversion 1991), TAH/SPO in 1996, cholecystectomy, lysis of adhesions.  Patient had cystolitholapaxy 11/02/21. She continues to catheterize ~7 times daily using a 16Fr coude tip catheter.  IC s/p hemi-kock pouch in 1990 and revision in 1997. Other PMH includes DMII, anxiety/depression, GERD, obesity, HCV s/p treatment.  She presents to the hospital due to progressively worsening dysphagia to the point of not tolerating liquids. She was recently seen by ID on 11/05/2022 and was recommended to contact GI to repeat EGD if dysphagia recurred.  This appears to be her first recurrence of dysphagia since that appointment. Last documented episode of candidal esophagitis was 10/04/2021 which was her sixth episode.  She underwent CT abdomen/pelvis for further workup.  Prior right hemicolectomy with ileocolonic anastomosis noted with no signs of obstruction.  Prior cystectomy noted with tubular density within the neobladder.  She was admitted for further workup of her dysphagia and GI was consulted on admission.  Interval History:  No issues overnight. Seems to be tolerating FLD but still states it feels as though something "sticks" when she is swallowing. No pain with swallowing.   Assessment and Plan: * Dysphagia - Given prior history of recurrent Candida esophagitis, this is certainly higher on the differential.  Cannot rule out stenosis as well and/or combination -Last ID note from July 2024 recommends for her to follow-up with GI if she develops recurrent dysphagia.  This  appears to be first episode since that visit -Would prefer endoscopic evidence of candidal esophagitis before implementing prolonged course of fluconazole again. She is on trial of PPI and nystatin - adding fluconazole 12/9 after discussion with GI - FLD for now pending further evaluation and rec's with GI  History of total cystectomy - hx of IC s/p hemi-kock pouch in 1990 and revision in 1997 - Patient had cystolitholapaxy 11/02/21. She continues to catheterize ~7 times daily using a 16Fr coude tip catheter.  -She continues to drain neobladder well with self-catheterization - CT mentions tubular density within the neobladder.  She was evaluated by urology on admission and is recommended to follow-up with [facility] for foreign body removal but she continues to drain bladder well with coud catheter in the meantime  Diabetes mellitus without complication (HCC) - Continue SSI and CBG monitoring  Essential hypertension - Continue amlodipine, Imdur, lisinopril-hct, Toprol  CAD (coronary artery disease) - Continue Crestor, lisinopril, Toprol    CKD-3B: Cr 1.4.  GFR 41.  At baseline -Monitor   Mild intermittent asthma: Stable   Obesity: BMI 34.33.  Already on Ozempic. -Encourage lifestyle change to lose weight   Anxiety and depression -Continue home Prozac    Old records reviewed in assessment of this patient  Antimicrobials:   DVT prophylaxis:  enoxaparin (LOVENOX) injection 40 mg Start: 03/15/23 2000   Code Status:   Code Status: Full Code  Mobility Assessment (Last 72 Hours)     Mobility Assessment     Row Name 03/18/23 1000 03/17/23 2007 03/17/23 1100 03/16/23 1953 03/16/23 0830   Does patient have an order for bedrest or is patient medically unstable No - Continue assessment No - Continue assessment  No - Continue assessment No - Continue assessment No - Continue assessment   What is the highest level of mobility based on the progressive mobility assessment? Level 6 (Walks  independently in room and hall) - Balance while walking in room without assist - Complete Level 6 (Walks independently in room and hall) - Balance while walking in room without assist - Complete Level 6 (Walks independently in room and hall) - Balance while walking in room without assist - Complete Level 6 (Walks independently in room and hall) - Balance while walking in room without assist - Complete Level 6 (Walks independently in room and hall) - Balance while walking in room without assist - Complete    Row Name 03/15/23 2002 03/15/23 1841 03/15/23 1800       Does patient have an order for bedrest or is patient medically unstable No - Continue assessment No - Continue assessment No - Continue assessment     What is the highest level of mobility based on the progressive mobility assessment? Level 6 (Walks independently in room and hall) - Balance while walking in room without assist - Complete Level 6 (Walks independently in room and hall) - Balance while walking in room without assist - Complete Level 6 (Walks independently in room and hall) - Balance while walking in room without assist - Complete              Barriers to discharge: none Disposition Plan:  Home Status is: Obs  Objective: Blood pressure 110/63, pulse 68, temperature 97.8 F (36.6 C), temperature source Oral, resp. rate 18, height 5\' 4"  (1.626 m), weight 90.7 kg, SpO2 99%.  Examination:  Physical Exam Constitutional:      Appearance: Normal appearance.  HENT:     Head: Normocephalic and atraumatic.     Mouth/Throat:     Mouth: Mucous membranes are moist.  Eyes:     Extraocular Movements: Extraocular movements intact.  Cardiovascular:     Rate and Rhythm: Normal rate and regular rhythm.  Pulmonary:     Effort: Pulmonary effort is normal. No respiratory distress.     Breath sounds: Normal breath sounds. No wheezing.  Abdominal:     General: Bowel sounds are normal. There is no distension.     Palpations:  Abdomen is soft.     Tenderness: There is no abdominal tenderness.     Comments: Stoma noted in right quadrant for neobladder access  Musculoskeletal:        General: Normal range of motion.     Cervical back: Normal range of motion and neck supple.  Skin:    General: Skin is warm and dry.  Neurological:     General: No focal deficit present.     Mental Status: She is alert.  Psychiatric:        Mood and Affect: Mood normal.      Consultants:  GI  Procedures:    Data Reviewed: Results for orders placed or performed during the hospital encounter of 03/15/23 (from the past 24 hour(s))  Glucose, capillary     Status: Abnormal   Collection Time: 03/17/23  4:46 PM  Result Value Ref Range   Glucose-Capillary 108 (H) 70 - 99 mg/dL   Comment 1 Notify RN    Comment 2 Document in Chart   Glucose, capillary     Status: Abnormal   Collection Time: 03/17/23  9:14 PM  Result Value Ref Range   Glucose-Capillary 101 (H) 70 - 99 mg/dL  Basic  metabolic panel     Status: Abnormal   Collection Time: 03/18/23  5:16 AM  Result Value Ref Range   Sodium 137 135 - 145 mmol/L   Potassium 3.6 3.5 - 5.1 mmol/L   Chloride 105 98 - 111 mmol/L   CO2 22 22 - 32 mmol/L   Glucose, Bld 75 70 - 99 mg/dL   BUN 17 8 - 23 mg/dL   Creatinine, Ser 8.65 (H) 0.44 - 1.00 mg/dL   Calcium 9.3 8.9 - 78.4 mg/dL   GFR, Estimated 39 (L) >60 mL/min   Anion gap 10 5 - 15  Magnesium     Status: None   Collection Time: 03/18/23  5:16 AM  Result Value Ref Range   Magnesium 2.0 1.7 - 2.4 mg/dL  Glucose, capillary     Status: None   Collection Time: 03/18/23  7:38 AM  Result Value Ref Range   Glucose-Capillary 78 70 - 99 mg/dL  Glucose, capillary     Status: Abnormal   Collection Time: 03/18/23 11:11 AM  Result Value Ref Range   Glucose-Capillary 142 (H) 70 - 99 mg/dL    I have reviewed pertinent nursing notes, vitals, labs, and images as necessary. I have ordered labwork to follow up on as indicated.  I have  reviewed the last notes from staff over past 24 hours. I have discussed patient's care plan and test results with nursing staff, CM/SW, and other staff as appropriate.    LOS: 0 days   Lewie Chamber, MD [facility] 03/18/2023, 2:02 PM

## 2023-03-18 NOTE — Care Management Obs Status (Signed)
MEDICARE OBSERVATION STATUS NOTIFICATION   Patient Details  Name: Laurie [name] MRN: [id number] Date of Birth: May 19, 1955   Medicare Observation Status Notification Given:  Yes Patient did not want to discuss observation status.  Obs letter put on patient's chart.   Darleene Cleaver, LCSW 03/18/2023, 5:55 PM

## 2023-03-19 ENCOUNTER — Observation Stay (HOSPITAL_COMMUNITY): Payer: Medicare PPO | Admitting: Anesthesiology

## 2023-03-19 ENCOUNTER — Encounter (HOSPITAL_COMMUNITY): Payer: Self-pay | Admitting: Student

## 2023-03-19 ENCOUNTER — Encounter (HOSPITAL_COMMUNITY)
Admission: EM | Disposition: A | Discharge status: Home / Self Care | Payer: Self-pay | Source: Home / Self Care | Attending: Emergency Medicine

## 2023-03-19 DIAGNOSIS — Z906 Acquired absence of other parts of urinary tract: Secondary | ICD-10-CM | POA: Diagnosis not present

## 2023-03-19 DIAGNOSIS — R1310 Dysphagia, unspecified: Secondary | ICD-10-CM

## 2023-03-19 DIAGNOSIS — K222 Esophageal obstruction: Secondary | ICD-10-CM

## 2023-03-19 DIAGNOSIS — R1312 Dysphagia, oropharyngeal phase: Secondary | ICD-10-CM | POA: Diagnosis not present

## 2023-03-19 HISTORY — PX: ESOPHAGOGASTRODUODENOSCOPY (EGD) WITH PROPOFOL: SHX5813

## 2023-03-19 HISTORY — PX: BALLOON DILATION: SHX5330

## 2023-03-19 LAB — GLUCOSE, CAPILLARY
Glucose-Capillary: 94 mg/dL (ref 70–99)
Glucose-Capillary: 127 mg/dL — ABNORMAL HIGH (ref 70–99)

## 2023-03-19 SURGERY — ESOPHAGOGASTRODUODENOSCOPY (EGD) WITH PROPOFOL
Anesthesia: Monitor Anesthesia Care | Site: Esophagus

## 2023-03-19 MED ORDER — LIDOCAINE HCL (CARDIAC) PF 100 MG/5ML IV SOSY
PREFILLED_SYRINGE | INTRAVENOUS | Status: DC | PRN
  Administered 2023-03-19: 100 mg via INTRAVENOUS

## 2023-03-19 MED ORDER — PROPOFOL 1000 MG/100ML IV EMUL
INTRAVENOUS | Status: AC
  Filled 2023-03-19: qty 100

## 2023-03-19 MED ORDER — PROPOFOL 500 MG/50ML IV EMUL
INTRAVENOUS | Status: AC
  Filled 2023-03-19: qty 50

## 2023-03-19 MED ORDER — PANTOPRAZOLE SODIUM 40 MG PO TBEC: 40.0000 mg | DELAYED_RELEASE_TABLET | Freq: Every day | ORAL | 3 refills | Status: AC

## 2023-03-19 MED ORDER — SODIUM CHLORIDE 0.9 % IV SOLN
INTRAVENOUS | Status: DC
Start: 2023-03-19 — End: 2023-03-19

## 2023-03-19 MED ORDER — EPHEDRINE SULFATE-NACL 50-0.9 MG/10ML-% IV SOSY
PREFILLED_SYRINGE | INTRAVENOUS | Status: DC | PRN
Start: 2023-03-19 — End: 2023-03-19
  Administered 2023-03-19: 10 mg via INTRAVENOUS

## 2023-03-19 MED ORDER — PROPOFOL 10 MG/ML IV BOLUS
INTRAVENOUS | Status: DC | PRN
  Administered 2023-03-19: 30 mg via INTRAVENOUS
  Administered 2023-03-19: 20 mg via INTRAVENOUS
  Administered 2023-03-19: 2 mg via INTRAVENOUS
  Administered 2023-03-19: 80 mg via INTRAVENOUS
  Administered 2023-03-19: 20 mg via INTRAVENOUS

## 2023-03-19 SURGICAL SUPPLY — 14 items

## 2023-03-19 NOTE — Discharge Summary (Signed)
Physician Discharge Summary   Laurie [name] QIH:[id number] DOB: 09-12-1955 DOA: 03/15/2023  PCP: Margit Hanks, MD  Admit date: 03/15/2023 Discharge date: 03/19/2023   Admitted From: Home Disposition:  Home Discharging physician: Lewie Chamber, MD Barriers to discharge: none  Recommendations at discharge: Follow up with [facility]    Discharge Condition: stable CODE STATUS: Full Diet recommendation:  Diet Orders (From admission, onward)     Start     Ordered   03/19/23 1214  DIET SOFT Room service appropriate? Yes; Fluid consistency: Thin  Diet effective now       Question Answer Comment  Room service appropriate? Yes   Fluid consistency: Thin      03/19/23 1213            Hospital Course: Laurie [name] is a 67 yo female with extensive PMH including recurrent candidal esophagitis, multiple prior surgeries for chronic abdomino-pelvic pain/interstitial cystitis (including prior cystectomy with cystoplasty 1985, later take-down of cystoplasty with ureteral diversion 1991), TAH/SPO in 1996, cholecystectomy, lysis of adhesions.  Patient had cystolitholapaxy 11/02/21. She continues to catheterize ~7 times daily using a 16Fr coude tip catheter.  IC s/p hemi-kock pouch in 1990 and revision in 1997. Other PMH includes DMII, anxiety/depression, GERD, obesity, HCV s/p treatment.  She presents to the hospital due to progressively worsening dysphagia to the point of not tolerating liquids. She was recently seen by ID on 11/05/2022 and was recommended to contact GI to repeat EGD if dysphagia recurred.  This appears to be her first recurrence of dysphagia since that appointment. Last documented episode of candidal esophagitis was 10/04/2021 which was her sixth episode.  She underwent CT abdomen/pelvis for further workup.  Prior right hemicolectomy with ileocolonic anastomosis noted with no signs of obstruction.  Prior cystectomy noted with tubular density within the  neobladder.  She was admitted for further workup of her dysphagia and GI was consulted on admission. She did not improve with supportive care after admission and underwent EGD with GI on 03/19/2023.  She was found to have benign-appearing intrinsic mild stenosis versus spastic GE junction and underwent dilation with balloon dilator.  She had improvement in her symptoms after dilation and diet was advanced.  She was continued on Protonix at discharge.  Due to no evidence of Candida, fluconazole was discontinued.  Assessment and Plan: * Dysphagia - Given prior history of recurrent Candida esophagitis, this is certainly higher on the differential.  Cannot rule out stenosis as well and/or combination -Last ID note from July 2024 recommends for her to follow-up with GI if she develops recurrent dysphagia.  This appears to be first episode since that visit -Would prefer endoscopic evidence of candidal esophagitis before implementing prolonged course of fluconazole again. She is on trial of PPI and nystatin -EGD performed 03/19/2023 revealing intrinsic mild stenosis and underwent dilation with improvement in dysphagia after procedure -Continued on Protonix at discharge - Fluconazole was discontinued as no evidence of candida during EGD  History of total cystectomy - hx of IC s/p hemi-kock pouch in 1990 and revision in 1997 - Patient had cystolitholapaxy 11/02/21. She continues to catheterize ~7 times daily using a 16Fr coude tip catheter.  -She continues to drain neobladder well with self-catheterization - CT mentions tubular density within the neobladder.  She was evaluated by urology on admission and is recommended to follow-up with [facility] for foreign body removal but she continues to drain bladder well with coud catheter in the meantime  Diabetes mellitus without complication (HCC) -  Resume home regimen  Essential hypertension - Continue amlodipine, Imdur, lisinopril-hct, Toprol  CAD (coronary  artery disease) - Continue Crestor, lisinopril, Toprol   The patient's acute and chronic medical conditions were treated accordingly. On day of discharge, patient was felt deemed stable for discharge. Patient/family member advised to call PCP or come back to ER if needed.   Principal Diagnosis: Dysphagia  Discharge Diagnoses: Active Hospital Problems   Diagnosis Date Noted   Dysphagia 03/15/2023    Priority: 1.   History of total cystectomy 03/16/2023    Priority: 2.   Diabetes mellitus without complication (HCC)    Essential hypertension 06/24/2015   CAD (coronary artery disease) 06/24/2015    Resolved Hospital Problems  No resolved problems to display.     Discharge Instructions     Increase activity slowly   Complete by: As directed       Allergies as of 03/19/2023       Reactions   Buchu-cornsilk-ch Grass-hydran Other (See Comments)   Dehydration. Elevated creatini Other reaction(s): Other (See Comments) Dehydration, increase in creatinine   Celebrex [celecoxib] Itching   Ciprofloxacin Other (See Comments)   No history of rash or SOB. Had kidney problems when she took cipro but was not told she had interstitial nephritis. Other reaction(s): Other (See Comments), Unknown Kidney problems   Cymbalta [duloxetine Hcl]    GI upset / constipation.    Dilaudid [hydromorphone Hcl] Itching   Fentanyl And Related Itching   Glucophage [metformin Hcl] Other (See Comments)   Stopped due to increase creat   Oxycodone Itching   Pregabalin Other (See Comments)   CNS disorder. Headache Other reaction(s): Other (See Comments) Headache, CNS disorder   Hydrocodone-acetaminophen Rash   Morphine And Codeine Rash, Itching   Sulfa Antibiotics Rash   Vicodin [hydrocodone-acetaminophen] Rash        Medication List     STOP taking these medications    Dexcom G7 Sensor Misc       TAKE these medications    albuterol 108 (90 Base) MCG/ACT inhaler Commonly known as:  VENTOLIN HFA Inhale 2 puffs into the lungs every 4 (four) hours as needed for wheezing or shortness of breath.   amLODipine 5 MG tablet Commonly known as: NORVASC Take 10 mg by mouth daily.   aspirin EC 81 MG tablet Take 81 mg by mouth daily.   cholecalciferol 1000 units tablet Commonly known as: VITAMIN D Take 1,000 Units by mouth daily.   cyanocobalamin 1000 MCG tablet Take 1,000 mcg by mouth daily.   Dexcom G7 Receiver Devi Inject 1 Device into the skin continuous.   FLUoxetine 20 MG capsule Commonly known as: PROZAC Take 20 mg by mouth daily.   fluticasone 50 MCG/ACT nasal spray Commonly known as: FLONASE Place 2 sprays into both nostrils daily as needed for allergies or rhinitis.   glucagon 1 MG injection Inject 1 mg into the vein once as needed (severe insulin reaction).   hydrochlorothiazide 12.5 MG tablet Commonly known as: HYDRODIURIL Take 12.5 mg by mouth daily.   hydrocortisone 2.5 % rectal cream Commonly known as: ANUSOL-HC Place 1 application rectally 3 (three) times daily as needed for hemorrhoids.   insulin glargine 100 UNIT/ML injection Commonly known as: LANTUS Inject 0.15 mLs (15 Units total) into the skin at bedtime. What changed:  how much to take when to take this   isosorbide mononitrate 60 MG 24 hr tablet Commonly known as: IMDUR Take 1 tablet by mouth daily.   lidocaine  5 % ointment Commonly known as: XYLOCAINE Apply 1 application topically 4 (four) times daily as needed for moderate pain.   lisinopril 5 MG tablet Commonly known as: ZESTRIL Take 5 mg by mouth daily.   loperamide 2 MG capsule Commonly known as: IMODIUM Take 2 mg by mouth every 6 (six) hours as needed for diarrhea or loose stools.   Magnesium 400 MG Tabs Take 1 tablet by mouth daily.   metFORMIN 500 MG 24 hr tablet Commonly known as: GLUCOPHAGE-XR Take 500 mg by mouth daily with breakfast.   metoprolol succinate 100 MG 24 hr tablet Commonly known as:  TOPROL-XL Take 100 mg by mouth daily. Take with or immediately following a meal.   multivitamin with minerals Tabs tablet Take 1 tablet by mouth daily.   Nitroglycerin 0.4 % Oint Place 1 application rectally 2 (two) times daily as needed (for chest pain.).   nitroGLYCERIN 0.4 MG SL tablet Commonly known as: NITROSTAT Place 0.4 mg under the tongue every 5 (five) minutes as needed for chest pain.   Ozempic (1 MG/DOSE) 4 MG/3ML Sopn Generic drug: Semaglutide (1 MG/DOSE) Inject 1 mg into the skin once a week.   pantoprazole 40 MG tablet Commonly known as: PROTONIX Take 1 tablet (40 mg total) by mouth daily. What changed:  medication strength how much to take   promethazine 25 MG tablet Commonly known as: PHENERGAN Take 25 mg by mouth every 6 (six) hours as needed for nausea or vomiting.   ranolazine 500 MG 12 hr tablet Commonly known as: RANEXA Take 500 mg by mouth 2 (two) times daily.   rosuvastatin 20 MG tablet Commonly known as: CRESTOR Take 20 mg by mouth daily.   traMADol 50 MG tablet Commonly known as: ULTRAM Take 1 tablet (50 mg total) every 6 (six) hours as needed by mouth.   traZODone 50 MG tablet Commonly known as: DESYREL Take 50 mg by mouth at bedtime.   triamcinolone cream 0.5 % Commonly known as: KENALOG Apply 1 application topically 2 (two) times daily as needed (for skin irritation).        Allergies  Allergen Reactions   Buchu-Cornsilk-Ch Grass-Hydran Other (See Comments)    Dehydration. Elevated creatini Other reaction(s): Other (See Comments) Dehydration, increase in creatinine   Celebrex [Celecoxib] Itching   Ciprofloxacin Other (See Comments)    No history of rash or SOB. Had kidney problems when she took cipro but was not told she had interstitial nephritis. Other reaction(s): Other (See Comments), Unknown Kidney problems   Cymbalta [Duloxetine Hcl]     GI upset / constipation.    Dilaudid [Hydromorphone Hcl] Itching   Fentanyl And  Related Itching   Glucophage [Metformin Hcl] Other (See Comments)    Stopped due to increase creat   Oxycodone Itching   Pregabalin Other (See Comments)    CNS disorder. Headache Other reaction(s): Other (See Comments) Headache, CNS disorder   Hydrocodone-Acetaminophen Rash   Morphine And Codeine Rash and Itching   Sulfa Antibiotics Rash   Vicodin [Hydrocodone-Acetaminophen] Rash    Consultations: GI  Procedures: 12/10: EGD  Discharge Exam: BP 106/63 (BP Location: Right Arm)   Pulse (!) 58   Temp 98.2 F (36.8 C) (Oral)   Resp 16   Ht 5\' 4"  (1.626 m)   Wt 81.6 kg   SpO2 100%   BMI 30.90 kg/m  Physical Exam Constitutional:      Appearance: Normal appearance.  HENT:     Head: Normocephalic and atraumatic.  Mouth/Throat:     Mouth: Mucous membranes are moist.  Eyes:     Extraocular Movements: Extraocular movements intact.  Cardiovascular:     Rate and Rhythm: Normal rate and regular rhythm.  Pulmonary:     Effort: Pulmonary effort is normal. No respiratory distress.     Breath sounds: Normal breath sounds. No wheezing.  Abdominal:     General: Bowel sounds are normal. There is no distension.     Palpations: Abdomen is soft.     Tenderness: There is no abdominal tenderness.     Comments: Stoma noted in right quadrant for neobladder access  Musculoskeletal:        General: Normal range of motion.     Cervical back: Normal range of motion and neck supple.  Skin:    General: Skin is warm and dry.  Neurological:     General: No focal deficit present.     Mental Status: She is alert.  Psychiatric:        Mood and Affect: Mood normal.      The results of significant diagnostics from this hospitalization (including imaging, microbiology, ancillary and laboratory) are listed below for reference.   Microbiology: No results found for this or any previous visit (from the past 240 hour(s)).   Labs: BNP (last 3 results) No results for input(s): "BNP" in the last  8760 hours. Basic Metabolic Panel: Recent Labs  Lab 03/15/23 1341 03/16/23 0538 03/17/23 0554 03/18/23 0516  NA 137 137 137 137  K 3.5 3.2* 3.6 3.6  CL 104 103 104 105  CO2 21* 24 23 22   GLUCOSE 175* 84 107* 75  BUN 21 19 18 17   CREATININE 1.39* 1.28* 1.49* 1.47*  CALCIUM 9.3 9.0 9.0 9.3  MG  --   --  2.0 2.0   Liver Function Tests: Recent Labs  Lab 03/15/23 1341  AST 33  ALT 27  ALKPHOS 70  BILITOT 0.7  PROT 7.8  ALBUMIN 3.8   No results for input(s): "LIPASE", "AMYLASE" in the last 168 hours. No results for input(s): "AMMONIA" in the last 168 hours. CBC: Recent Labs  Lab 03/15/23 1341 03/16/23 0538  WBC 9.3 8.9  NEUTROABS 6.2  --   HGB 12.9 12.9  HCT 39.3 39.2  MCV 99.2 100.0  PLT 349 350   Cardiac Enzymes: No results for input(s): "CKTOTAL", "CKMB", "CKMBINDEX", "TROPONINI" in the last 168 hours. BNP: Invalid input(s): "POCBNP" CBG: Recent Labs  Lab 03/18/23 1111 03/18/23 1622 03/18/23 2128 03/19/23 0610 03/19/23 1114  GLUCAP 142* 90 157* 94 127*   D-Dimer No results for input(s): "DDIMER" in the last 72 hours. Hgb A1c No results for input(s): "HGBA1C" in the last 72 hours. Lipid Profile No results for input(s): "CHOL", "HDL", "LDLCALC", "TRIG", "CHOLHDL", "LDLDIRECT" in the last 72 hours. Thyroid function studies No results for input(s): "TSH", "T4TOTAL", "T3FREE", "THYROIDAB" in the last 72 hours.  Invalid input(s): "FREET3" Anemia work up No results for input(s): "VITAMINB12", "FOLATE", "FERRITIN", "TIBC", "IRON", "RETICCTPCT" in the last 72 hours. Urinalysis    Component Value Date/Time   COLORURINE YELLOW 03/15/2023 1358   APPEARANCEUR CLOUDY (A) 03/15/2023 1358   LABSPEC 1.013 03/15/2023 1358   PHURINE 6.0 03/15/2023 1358   GLUCOSEU NEGATIVE 03/15/2023 1358   HGBUR NEGATIVE 03/15/2023 1358   BILIRUBINUR NEGATIVE 03/15/2023 1358   KETONESUR NEGATIVE 03/15/2023 1358   PROTEINUR 100 (A) 03/15/2023 1358   UROBILINOGEN 0.2  12/28/2014 2013   NITRITE NEGATIVE 03/15/2023 1358   LEUKOCYTESUR TRACE (  A) 03/15/2023 1358   Sepsis Labs Recent Labs  Lab 03/15/23 1341 03/16/23 0538  WBC 9.3 8.9   Microbiology No results found for this or any previous visit (from the past 240 hour(s)).  Procedures/Studies: CT ABDOMEN PELVIS W CONTRAST  Result Date: 03/15/2023 CLINICAL DATA:  Lower quadrant pain previous bladder resection EXAM: CT ABDOMEN AND PELVIS WITH CONTRAST TECHNIQUE: Multidetector CT imaging of the abdomen and pelvis was performed using the standard protocol following bolus administration of intravenous contrast. RADIATION DOSE REDUCTION: This exam was performed according to the departmental dose-optimization program which includes automated exposure control, adjustment of the mA and/or kV according to patient size and/or use of iterative reconstruction technique. CONTRAST:  OMNIPAQUE IOHEXOL 300 MG/ML  SOLN COMPARISON:  CT 12/17/2022, 02/16/2021 FINDINGS: Lower chest: Lung bases demonstrate mild dependent atelectasis and minimal subpleural fibrosis. No acute airspace disease. Hepatobiliary: Cholecystectomy. No biliary dilatation or focal hepatic abnormality Pancreas: Unremarkable. No pancreatic ductal dilatation or surrounding inflammatory changes. Spleen: Normal in size without focal abnormality. Adrenals/Urinary Tract: Thickened adrenal glands without dominant nodule, no change. Kidneys show no hydronephrosis. Status post cystectomy with right lower quadrant urinary diversion. There is a tubular density within the neobladder, previously this was visualized within the subcutaneous soft tissues of the right abdominal ostomy. Stomach/Bowel: Decompressed stomach. No dilated small bowel. Status post right hemicolectomy with ileal colic anastomosis in the right upper quadrant. No obstruction. No acute bowel wall thickening. Diverticular disease of the sigmoid colon without acute inflammatory changes. Multiple small bowel  loops abutting the anterior peritoneal cavity possibly due to adhesive disease but no obstructive changes. Vascular/Lymphatic: Moderate aortic atherosclerosis. No aneurysm. No suspicious lymph nodes. Reproductive: Hysterectomy.  No adnexal mass. Other: Negative for pelvic effusion or free air. Skin thickening and subcutaneous infiltration of the left abdominal wall, no change. Evidence of prior umbilical hernia repair. Musculoskeletal: No acute or suspicious osseous abnormality. IMPRESSION: 1. Status post cystectomy with right lower quadrant urinary diversion. There is a tubular density within the neobladder, previously this was visualized within the subcutaneous soft tissues of the right abdominal ostomy, this may represent a catheter fragment or stent. Correlate with the patient's surgical/procedural history. 2. Status post right hemicolectomy with ileocolic anastomosis in the right upper quadrant. No obstruction. 3. Diverticular disease of the sigmoid colon without acute inflammatory changes. 4. Aortic atherosclerosis. Aortic Atherosclerosis (ICD10-I70.0). Electronically Signed   By: Jasmine Pang M.D.   On: 03/15/2023 16:50   DG Chest Portable 1 View  Result Date: 03/15/2023 CLINICAL DATA:  Dysphagia.  Right-sided flank pain. EXAM: PORTABLE CHEST 1 VIEW COMPARISON:  02/18/2021. FINDINGS: Bilateral lung fields are clear. No acute consolidation or lung collapse. No pulmonary edema. Redemonstration of elevated right hemidiaphragm. Bilateral costophrenic angles are clear. Normal cardio-mediastinal silhouette. No acute osseous abnormalities. The soft tissues are within normal limits. IMPRESSION: No active disease. Electronically Signed   By: Jules Schick M.D.   On: 03/15/2023 14:52     Time coordinating discharge: Over 30 minutes    Lewie Chamber, MD  [facility] 03/19/2023, 2:13 PM

## 2023-03-19 NOTE — Transfer of Care (Signed)
Immediate Anesthesia Transfer of Care Note  Patient: Laurie [name]  Procedure(s) Performed: ESOPHAGOGASTRODUODENOSCOPY (EGD) WITH PROPOFOL BALLOON DILATION (Esophagus)  Patient Location: Endoscopy Unit  Anesthesia Type:MAC  Level of Consciousness: awake  Airway & Oxygen Therapy: Patient Spontanous Breathing and Patient connected to face mask oxygen  Post-op Assessment: Report given to RN and Post -op Vital signs reviewed and stable  Post vital signs: Reviewed and stable  Last Vitals:  Vitals Value Taken Time  BP    Temp    Pulse 57 03/19/23 0803  Resp 18 03/19/23 0803  SpO2 100 % 03/19/23 0803  Vitals shown include unfiled device data.  Last Pain:  Vitals:   03/19/23 0700  TempSrc: Temporal  PainSc: 8       Patients Stated Pain Goal: 0 (03/17/23 2112)  Complications: No notable events documented.

## 2023-03-19 NOTE — Anesthesia Postprocedure Evaluation (Signed)
Anesthesia Post Note  Patient: Laurie [name]  Procedure(s) Performed: ESOPHAGOGASTRODUODENOSCOPY (EGD) WITH PROPOFOL BALLOON DILATION (Esophagus)     Patient location during evaluation: PACU Anesthesia Type: MAC Level of consciousness: awake and alert Pain management: pain level controlled Vital Signs Assessment: post-procedure vital signs reviewed and stable Respiratory status: spontaneous breathing Cardiovascular status: stable Anesthetic complications: no   No notable events documented.  Last Vitals:  Vitals:   03/19/23 0830 03/19/23 0848  BP: (!) 111/38 106/63  Pulse: (!) 58 (!) 58  Resp: 19 16  Temp:  36.8 C  SpO2: 97% 100%    Last Pain:  Vitals:   03/19/23 1100  TempSrc:   PainSc: 0-No pain                 Lewie Loron

## 2023-03-19 NOTE — Anesthesia Preprocedure Evaluation (Addendum)
Anesthesia Evaluation  Patient identified by MRN, date of birth, ID band Patient awake    Reviewed: Allergy & Precautions, NPO status , Patient's Chart, lab work & pertinent test results  Airway Mallampati: III  TM Distance: >3 FB Neck ROM: Full    Dental  (+) Edentulous Upper, Edentulous Lower, Upper Dentures   Pulmonary asthma , former smoker   breath sounds clear to auscultation       Cardiovascular hypertension, Pt. on medications and Pt. on home beta blockers + CAD  Normal cardiovascular exam Rhythm:Regular Rate:Normal  Echo 02/2021  1. Left ventricular ejection fraction, by estimation, is 55 to 60%. The left ventricle has normal function. The left ventricle has no regional wall motion abnormalities.   2. Right ventricular systolic function is normal. The right ventricular size is normal.   3. No left atrial/left atrial appendage thrombus was detected.   4. The mitral valve is normal in structure. Trivial mitral valve regurgitation.   5. The aortic valve is tricuspid. Aortic valve regurgitation is not visualized.   6. There is mild (Grade II) plaque involving the descending aorta.   7. Agitated saline contrast bubble study was negative, with no evidence of any interatrial shunt.     Neuro/Psych negative neurological ROS  negative psych ROS   GI/Hepatic ,GERD  Medicated and Controlled,,(+) Hepatitis -, C  Endo/Other  diabetes, Well Controlled, Type 2, Oral Hypoglycemic Agents, Insulin Dependent  Obesity BMI 34 a1c 6.2  Renal/GU ARFRenal diseaseCr 4.19     Musculoskeletal negative musculoskeletal ROS (+)    Abdominal   Peds  Hematology  (+) Blood dyscrasia, anemia   Anesthesia Other Findings Chronic LBP, abdominal pain  Reproductive/Obstetrics                             Anesthesia Physical Anesthesia Plan  ASA: 3  Anesthesia Plan: MAC   Post-op Pain Management: Minimal or no pain  anticipated   Induction: Intravenous  PONV Risk Score and Plan: 4 or greater and Treatment may vary due to age or medical condition, Propofol infusion and TIVA  Airway Management Planned: Nasal Cannula, Natural Airway and Simple Face Mask  Additional Equipment: None  Intra-op Plan:   Post-operative Plan:   Informed Consent: I have reviewed the patients History and Physical, chart, labs and discussed the procedure including the risks, benefits and alternatives for the proposed anesthesia with the patient or authorized representative who has indicated his/her understanding and acceptance.     Dental advisory given  Plan Discussed with: CRNA  Anesthesia Plan Comments:         Anesthesia Quick Evaluation

## 2023-03-19 NOTE — Progress Notes (Signed)
Laurie [name] 7:43 AM  Subjective: Patient familiar to me from this weekend not doing any better with empiric therapy has no new complaints  Objective: No signs stable afebrile no acute distress exam please see preassessment evaluation No new labs currently Assessment: Odynophagia dysphagia  Plan: Okay to proceed with endoscopy possible dilation with anesthesia assistance  [facility]  office [phone] After 5PM or if no answer call [phone]

## 2023-03-19 NOTE — Plan of Care (Signed)
  Problem: Clinical Measurements: Goal: Will remain free from infection Outcome: Progressing Goal: Diagnostic test results will improve Outcome: Progressing   Problem: Elimination: Goal: Will not experience complications related to bowel motility Outcome: Progressing

## 2023-03-19 NOTE — Op Note (Signed)
[facility] Patient Name: Laurie [name] Procedure Date: 03/19/2023 MRN: [id number] Attending MD: Vida Rigger , MD, [id number] Date of Birth: 04-16-1955 CSN: [id number] Age: 67 Admit Type: Inpatient Procedure:                Upper GI endoscopy Indications:              Dysphagia, Odynophagia Providers:                Vida Rigger, MD, Lorenza Evangelist, RN, Alan Ripper,                            Technician Referring MD:              Medicines:                Monitored Anesthesia Care Complications:            No immediate complications. Estimated Blood Loss:     Estimated blood loss: none. Procedure:                Pre-Anesthesia Assessment:                           - Prior to the procedure, a History and Physical                            was performed, and patient medications and                            allergies were reviewed. The patient's tolerance of                            previous anesthesia was also reviewed. The risks                            and benefits of the procedure and the sedation                            options and risks were discussed with the patient.                            All questions were answered, and informed consent                            was obtained. Prior Anticoagulants: The patient has                            taken no anticoagulant or antiplatelet agents. ASA                            Grade Assessment: III - A patient with severe                            systemic disease. After reviewing the risks and  benefits, the patient was deemed in satisfactory                            condition to undergo the procedure.                           After obtaining informed consent, the endoscope was                            passed under direct vision. Throughout the                            procedure, the patient's blood pressure, pulse, and                            oxygen saturations were  monitored continuously. The                            GIF-H190 ([id number] Olympus endoscope was introduced                            through the mouth, and advanced to the third part                            of duodenum. The upper GI endoscopy was                            accomplished without difficulty. The patient                            tolerated the procedure well. Scope In: Scope Out: Findings:      The larynx was normal.      LA Grade A (one or more mucosal breaks less than 5 mm, not extending       between tops of 2 mucosal folds) esophagitis with no bleeding was found.      One benign-appearing, intrinsic mild stenosis versus spastic GE junction       was found. The stenosis was traversed. A TTS dilator was passed through       the scope. Dilation with a 15-16.5-18 mm balloon dilator was performed       to 16.5 mm. The dilation site was examined and showed mild mucosal       disruption and mild improvement in luminal narrowing.      The entire examined stomach was normal.      The duodenal bulb, first portion of the duodenum, second portion of the       duodenum and third portion of the duodenum were normal.      The exam was otherwise without abnormality. Impression:               - Normal larynx.                           - LA Grade A reflux esophagitis with no bleeding.                           -  Benign-appearing esophageal stenosis versus GE                            junction spasm. Dilated.                           - Normal stomach.                           - Normal duodenal bulb, first portion of the                            duodenum, second portion of the duodenum and third                            portion of the duodenum.                           - The examination was otherwise normal.                           - No specimens collected. Moderate Sedation:      Not Applicable - Patient had care per Anesthesia. Recommendation:           - Full liquid  diet today. If does well with full                            liquids may have soft solids                           - Continue present medications. Make sure she stays                            on pump inhibitors long-term                           - Return to GI clinic PRN.                           - Telephone GI clinic if symptomatic PRN. Procedure Code(s):        --- Professional ---                           [phone], Esophagogastroduodenoscopy, flexible,                            transoral; with transendoscopic balloon dilation of                            esophagus (less than 30 mm diameter) Diagnosis Code(s):        --- Professional ---                           K21.00, Gastro-esophageal reflux disease with  esophagitis, without bleeding                           K22.2, Esophageal obstruction                           R13.10, Dysphagia, unspecified CPT copyright 2022 [employer]. All rights reserved. The codes documented in this report are preliminary and upon coder review may  be revised to meet current compliance requirements. Vida Rigger, MD 03/19/2023 8:12:31 AM This report has been signed electronically. Number of Addenda: 0

## 2023-03-20 ENCOUNTER — Encounter (HOSPITAL_COMMUNITY): Payer: Self-pay | Admitting: Gastroenterology

## 2023-07-21 ENCOUNTER — Other Ambulatory Visit: Payer: Self-pay

## 2023-07-21 ENCOUNTER — Encounter (HOSPITAL_COMMUNITY): Payer: Self-pay | Admitting: Emergency Medicine

## 2023-07-21 ENCOUNTER — Emergency Department (HOSPITAL_COMMUNITY)

## 2023-07-21 ENCOUNTER — Emergency Department (HOSPITAL_COMMUNITY)
Admission: EM | Admit: 2023-07-21 | Discharge: 2023-07-21 | Disposition: A | Discharge status: Other Acute Inpatient Hospital | Attending: Emergency Medicine | Admitting: Emergency Medicine

## 2023-07-21 DIAGNOSIS — N1330 Unspecified hydronephrosis: Secondary | ICD-10-CM | POA: Diagnosis not present

## 2023-07-21 DIAGNOSIS — Z79899 Other long term (current) drug therapy: Secondary | ICD-10-CM | POA: Diagnosis not present

## 2023-07-21 DIAGNOSIS — R739 Hyperglycemia, unspecified: Secondary | ICD-10-CM

## 2023-07-21 DIAGNOSIS — Z794 Long term (current) use of insulin: Secondary | ICD-10-CM | POA: Diagnosis not present

## 2023-07-21 DIAGNOSIS — Z7982 Long term (current) use of aspirin: Secondary | ICD-10-CM | POA: Diagnosis not present

## 2023-07-21 DIAGNOSIS — N1832 Chronic kidney disease, stage 3b: Secondary | ICD-10-CM | POA: Diagnosis not present

## 2023-07-21 DIAGNOSIS — Z7984 Long term (current) use of oral hypoglycemic drugs: Secondary | ICD-10-CM | POA: Diagnosis not present

## 2023-07-21 DIAGNOSIS — I2510 Atherosclerotic heart disease of native coronary artery without angina pectoris: Secondary | ICD-10-CM | POA: Diagnosis not present

## 2023-07-21 DIAGNOSIS — J4520 Mild intermittent asthma, uncomplicated: Secondary | ICD-10-CM | POA: Diagnosis not present

## 2023-07-21 DIAGNOSIS — D72829 Elevated white blood cell count, unspecified: Secondary | ICD-10-CM | POA: Diagnosis not present

## 2023-07-21 DIAGNOSIS — E1165 Type 2 diabetes mellitus with hyperglycemia: Secondary | ICD-10-CM | POA: Insufficient documentation

## 2023-07-21 DIAGNOSIS — R109 Unspecified abdominal pain: Secondary | ICD-10-CM | POA: Diagnosis present

## 2023-07-21 DIAGNOSIS — E1122 Type 2 diabetes mellitus with diabetic chronic kidney disease: Secondary | ICD-10-CM | POA: Diagnosis not present

## 2023-07-21 DIAGNOSIS — I129 Hypertensive chronic kidney disease with stage 1 through stage 4 chronic kidney disease, or unspecified chronic kidney disease: Secondary | ICD-10-CM | POA: Insufficient documentation

## 2023-07-21 DIAGNOSIS — N179 Acute kidney failure, unspecified: Secondary | ICD-10-CM | POA: Diagnosis not present

## 2023-07-21 LAB — CBC WITH DIFFERENTIAL/PLATELET
Abs Immature Granulocytes: 0.19 10*3/uL — ABNORMAL HIGH (ref 0.00–0.07)
Basophils Absolute: 0 10*3/uL (ref 0.0–0.1)
Basophils Relative: 0 %
Eosinophils Absolute: 0 10*3/uL (ref 0.0–0.5)
Eosinophils Relative: 0 %
HCT: 45.1 % (ref 36.0–46.0)
Hemoglobin: 14.5 g/dL (ref 12.0–15.0)
Immature Granulocytes: 1 %
Lymphocytes Relative: 5 %
Lymphs Abs: 1 10*3/uL (ref 0.7–4.0)
MCH: 31.9 pg (ref 26.0–34.0)
MCHC: 32.2 g/dL (ref 30.0–36.0)
MCV: 99.1 fL (ref 80.0–100.0)
Monocytes Absolute: 1.2 10*3/uL — ABNORMAL HIGH (ref 0.1–1.0)
Monocytes Relative: 6 %
Neutro Abs: 17.4 10*3/uL — ABNORMAL HIGH (ref 1.7–7.7)
Neutrophils Relative %: 88 %
Platelets: 479 10*3/uL — ABNORMAL HIGH (ref 150–400)
RBC: 4.55 MIL/uL (ref 3.87–5.11)
RDW: 14.5 % (ref 11.5–15.5)
WBC: 19.8 10*3/uL — ABNORMAL HIGH (ref 4.0–10.5)
nRBC: 0.3 % — ABNORMAL HIGH (ref 0.0–0.2)

## 2023-07-21 LAB — CBG MONITORING, ED
Glucose-Capillary: 389 mg/dL — ABNORMAL HIGH (ref 70–99)
Glucose-Capillary: 342 mg/dL — ABNORMAL HIGH (ref 70–99)

## 2023-07-21 LAB — COMPREHENSIVE METABOLIC PANEL WITH GFR
ALT: 41 U/L (ref 0–44)
AST: 44 U/L — ABNORMAL HIGH (ref 15–41)
Albumin: 4 g/dL (ref 3.5–5.0)
Alkaline Phosphatase: 80 U/L (ref 38–126)
Anion gap: 11 (ref 5–15)
BUN: 33 mg/dL — ABNORMAL HIGH (ref 8–23)
CO2: 14 mmol/L — ABNORMAL LOW (ref 22–32)
Calcium: 9.7 mg/dL (ref 8.9–10.3)
Chloride: 115 mmol/L — ABNORMAL HIGH (ref 98–111)
Creatinine, Ser: 2.21 mg/dL — ABNORMAL HIGH (ref 0.44–1.00)
GFR, Estimated: 24 mL/min — ABNORMAL LOW (ref >60)
Glucose, Bld: 419 mg/dL — ABNORMAL HIGH (ref 70–99)
Potassium: 3.6 mmol/L (ref 3.5–5.1)
Sodium: 140 mmol/L (ref 135–145)
Total Bilirubin: 0.6 mg/dL (ref 0.0–1.2)
Total Protein: 8.6 g/dL — ABNORMAL HIGH (ref 6.5–8.1)

## 2023-07-21 LAB — I-STAT CG4 LACTIC ACID, ED
Lactic Acid, Venous: 2.7 mmol/L (ref 0.5–1.9)
Lactic Acid, Venous: 2.1 mmol/L (ref 0.5–1.9)

## 2023-07-21 LAB — LIPASE, BLOOD: Lipase: 25 U/L (ref 11–51)

## 2023-07-21 LAB — BETA-HYDROXYBUTYRIC ACID: Beta-Hydroxybutyric Acid: 0.45 mmol/L — ABNORMAL HIGH (ref 0.05–0.27)

## 2023-07-21 LAB — PROTIME-INR
INR: 1.2 (ref 0.8–1.2)
Prothrombin Time: 15.2 s (ref 11.4–15.2)

## 2023-07-21 MED ORDER — FENTANYL CITRATE PF 50 MCG/ML IJ SOSY
50.0000 ug | PREFILLED_SYRINGE | Freq: Once | INTRAMUSCULAR | Status: AC
  Administered 2023-07-21: 50 ug via INTRAVENOUS
  Filled 2023-07-21: qty 1

## 2023-07-21 MED ORDER — DIPHENHYDRAMINE HCL 50 MG/ML IJ SOLN
25.0000 mg | Freq: Once | INTRAMUSCULAR | Status: AC
  Administered 2023-07-21: 25 mg via INTRAVENOUS
  Filled 2023-07-21: qty 1

## 2023-07-21 MED ORDER — LABETALOL HCL 5 MG/ML IV SOLN
5.0000 mg | Freq: Once | INTRAVENOUS | Status: AC
  Administered 2023-07-21: 5 mg via INTRAVENOUS
  Filled 2023-07-21: qty 4

## 2023-07-21 MED ORDER — LACTATED RINGERS IV BOLUS
1000.0000 mL | INTRAVENOUS | Status: AC
  Administered 2023-07-21 (×2): 1000 mL via INTRAVENOUS

## 2023-07-21 NOTE — ED Notes (Signed)
 Unsuccessful attempt at catheterizing pt- MD notified

## 2023-07-21 NOTE — ED Notes (Signed)
 [facility] notified of need for pt transportation

## 2023-07-21 NOTE — ED Provider Notes (Signed)
 [place] [facility] DE[facility]MENT AT [facility] Provider Note   CSN: [id number] Arrival date & time: 07/21/23  5621     History  Chief Complaint  Laurie [name] presents with   Altered Mental Status    Laurie [name] is a 68 y.o. female.  Laurie [name] is a 68 year old female with hx of interstitial cystitis s/p cystectomy with ileoconduit and Koch pouch in 1991, bladder stone s/p cystolitholapaxy 11/02/21 with recent scope of stoma with [facility] on 07/16/23, HTN, CAD , CVA, OSA , mild intermittent asthma, GERD, T2DM, CKD 3b that presents to the emergency department with abdominal pain and AMS.  Per EMS, the Laurie [name] was at her normal baseline when she was home from surgery.  Her roommate state that she seemed a little confused last night however she woke up this morning altered complaining of severe abdominal pain.  He states that she is oriented only to self and is repeatedly complaining about her pain.  They state that she was tachycardic and hypertensive and blood sugar was around 400 and route.  They state that family states that she is normally ANO x 4 and independent at baseline.  The history is provided by the EMS personnel. The history is limited by the condition of the Laurie [name].       Home Medications Prior to Admission medications   Medication Sig Start Date End Date Taking? Authorizing Provider  albuterol (PROVENTIL HFA;VENTOLIN HFA) 108 (90 BASE) MCG/ACT inhaler Inhale 2 puffs into the lungs every 4 (four) hours as needed for wheezing or shortness of breath.    [provider]  amLODipine (NORVASC) 5 MG tablet Take 10 mg by mouth daily.    [provider]  aspirin EC 81 MG tablet Take 81 mg by mouth daily.    [provider]  cholecalciferol (VITAMIN D) 1000 UNITS tablet Take 1,000 Units by mouth daily.    [provider]  Continuous Glucose Receiver (DEXCOM G7 RECEIVER) DEVI Inject 1 Device into the skin continuous. 03/22/22    [provider]  cyanocobalamin 1000 MCG tablet Take 1,000 mcg by mouth daily.     [provider]  FLUoxetine (PROZAC) 20 MG capsule Take 20 mg by mouth daily.     [provider]  fluticasone (FLONASE) 50 MCG/ACT nasal spray Place 2 sprays into both nostrils daily as needed for allergies or rhinitis.    [provider]  glucagon 1 MG injection Inject 1 mg into the vein once as needed (severe insulin reaction).    [provider]  hydrochlorothiazide (HYDRODIURIL) 12.5 MG tablet Take 12.5 mg by mouth daily.    [provider]  hydrocortisone (ANUSOL-HC) 2.5 % rectal cream Place 1 application rectally 3 (three) times daily as needed for hemorrhoids.     [provider]  insulin glargine (LANTUS) 100 UNIT/ML injection Inject 0.15 mLs (15 Units total) into the skin at bedtime. Laurie [name] taking differently: Inject 45 Units into the skin daily. 02/27/21   Oral Billings, MD  isosorbide mononitrate (IMDUR) 60 MG 24 hr tablet Take 1 tablet by mouth daily. 09/24/22   [provider]  lidocaine (XYLOCAINE) 5 % ointment Apply 1 application topically 4 (four) times daily as needed for moderate pain.    [provider]  lisinopril (ZESTRIL) 5 MG tablet Take 5 mg by mouth daily.    [provider]  loperamide (IMODIUM) 2 MG capsule Take 2 mg by mouth every 6 (six) hours as needed  for diarrhea or loose stools.    [provider]  Magnesium 400 MG TABS Take 1 tablet by mouth daily.    [provider]  metFORMIN (GLUCOPHAGE-XR) 500 MG 24 hr tablet Take 500 mg by mouth daily with breakfast.    [provider]  metoprolol succinate (TOPROL-XL) 100 MG 24 hr tablet Take 100 mg by mouth daily. Take with or immediately following a meal.    [provider]  Multiple Vitamin (MULTIVITAMIN WITH MINERALS) TABS tablet Take 1 tablet by mouth daily.    [provider]  nitroGLYCERIN (NITROSTAT)  0.4 MG SL tablet Place 0.4 mg under the tongue every 5 (five) minutes as needed for chest pain.    [provider]  Nitroglycerin 0.4 % OINT Place 1 application rectally 2 (two) times daily as needed (for chest pain.).     [provider]  pantoprazole (PROTONIX) 40 MG tablet Take 1 tablet (40 mg total) by mouth daily. 03/19/23   Faith Homes, MD  promethazine (PHENERGAN) 25 MG tablet Take 25 mg by mouth every 6 (six) hours as needed for nausea or vomiting.    [provider]  ranolazine (RANEXA) 500 MG 12 hr tablet Take 500 mg by mouth 2 (two) times daily.    [provider]  rosuvastatin (CRESTOR) 20 MG tablet Take 20 mg by mouth daily.    [provider]  traMADol (ULTRAM) 50 MG tablet Take 1 tablet (50 mg total) every 6 (six) hours as needed by mouth. Laurie [name] not taking: Reported on 03/15/2023 02/15/17   Maryanna Smart, PA-C  traZODone (DESYREL) 50 MG tablet Take 50 mg by mouth at bedtime.    [provider]  triamcinolone cream (KENALOG) 0.5 % Apply 1 application topically 2 (two) times daily as needed (for skin irritation).     [provider]      Allergies    Buchu-cornsilk-ch grass-hydran, Celebrex [celecoxib], Ciprofloxacin, Cymbalta [duloxetine hcl], Dilaudid [hydromorphone hcl], Fentanyl and related, Glucophage [metformin hcl], Oxycodone, Pregabalin, Hydrocodone-acetaminophen, Morphine and codeine, Sulfa antibiotics, and Vicodin [hydrocodone-acetaminophen]    Review of Systems   Review of Systems  Physical Exam Updated Vital Signs BP (!) 212/101   Pulse 96   Temp 98.8 F (37.1 C)   Resp (!) 21   Ht 5\' 4"  (1.626 m)   Wt 81.6 kg   SpO2 98%   BMI 30.88 kg/m  Physical Exam Vitals and nursing note reviewed.  Constitutional:      Appearance: Normal appearance. She is obese. She is ill-appearing.     Comments: Uncomfortable, writhing around in the bed holding her abdomen complaining of pain  HENT:     Head:  Normocephalic and atraumatic.     Nose: Nose normal.     Mouth/Throat:     Mouth: Mucous membranes are moist.     Pharynx: Oropharynx is clear.  Eyes:     Extraocular Movements: Extraocular movements intact.     Conjunctiva/sclera: Conjunctivae normal.  Cardiovascular:     Rate and Rhythm: Normal rate and regular rhythm.     Heart sounds: Normal heart sounds.  Pulmonary:     Effort: Pulmonary effort is normal.     Breath sounds: Normal breath sounds.  Abdominal:     General: Abdomen is flat.     Palpations: Abdomen is soft.     Tenderness: There is abdominal tenderness (Diffuse). There is no guarding or rebound.     Comments: Midline abdominal surgical scar well-healed appearing  Right lower quadrant stoma without any surrounding erythema, warmth or drainage  Musculoskeletal:        General: Normal range of motion.     Cervical back: Normal range of motion.  Skin:    General: Skin is warm and dry.  Neurological:     General: No focal deficit present.     Mental Status: She is alert.     Comments: Oriented to person only, unable to answer additional orientation questions, moving all 4 extremities spontaneously     ED Results / Procedures / Treatments   Labs (all labs ordered are listed, but only abnormal results are displayed) Labs Reviewed  COMPREHENSIVE METABOLIC PANEL WITH GFR - Abnormal; Notable for the following components:      Result Value   Chloride 115 (*)    CO2 14 (*)    Glucose, Bld 419 (*)    BUN 33 (*)    Creatinine, Ser 2.21 (*)    Total Protein 8.6 (*)    AST 44 (*)    GFR, Estimated 24 (*)    All other components within normal limits  CBC WITH DIFFERENTIAL/PLATELET - Abnormal; Notable for the following components:   WBC 19.8 (*)    Platelets 479 (*)    nRBC 0.3 (*)    Neutro Abs 17.4 (*)    Monocytes Absolute 1.2 (*)    Abs Immature Granulocytes 0.19 (*)    All other components within normal limits  BETA-HYDROXYBUTYRIC ACID - Abnormal; Notable for  the following components:   Beta-Hydroxybutyric Acid 0.45 (*)    All other components within normal limits  I-STAT CG4 LACTIC ACID, ED - Abnormal; Notable for the following components:   Lactic Acid, Venous 2.7 (*)    All other components within normal limits  CBG MONITORING, ED - Abnormal; Notable for the following components:   Glucose-Capillary 389 (*)    All other components within normal limits  CBG MONITORING, ED - Abnormal; Notable for the following components:   Glucose-Capillary 342 (*)    All other components within normal limits  CULTURE, BLOOD (ROUTINE X 2)  CULTURE, BLOOD (ROUTINE X 2)  PROTIME-INR  LIPASE, BLOOD  URINALYSIS, W/ REFLEX TO CULTURE (INFECTION SUSPECTED)  I-STAT CG4 LACTIC ACID, ED    EKG EKG Interpretation Date/Time:  Sunday July 21 2023 09:54:17 EDT Ventricular Rate:  115 PR Interval:  158 QRS Duration:  95 QT Interval:  309 QTC Calculation: 428 R Axis:   81  Text Interpretation: Sinus tachycardia Right atrial enlargement Borderline right axis deviation Low voltage, precordial leads Duplicate EKG Confirmed by Celesta Coke ([id number]) on 07/21/2023 11:23:17 AM  Radiology CT ABDOMEN PELVIS WO CONTRAST Result Date: 07/21/2023 CLINICAL DATA:  Retrieval of foreign body from ileal reservoir at outside institution on 07/16/2023. Now with abdominal pain. EXAM: CT ABDOMEN AND PELVIS WITHOUT CONTRAST TECHNIQUE: Multidetector CT imaging of the abdomen and pelvis was performed following the standard protocol without IV contrast. RADIATION DOSE REDUCTION: This exam was performed according to the departmental dose-optimization program which includes automated exposure control, adjustment of the mA and/or kV according to Laurie [name] size and/or use of iterative reconstruction technique. COMPARISON:  03/15/2023 FINDINGS: Markedly motion degraded study. Lower chest: Dependent atelectasis. Hepatobiliary: No suspicious focal abnormality in the liver on this study without  intravenous contrast. Cholecystectomy no evidence for extrahepatic biliary duct dilatation although assessment is limited by motion. Pancreas: Limited assessment demonstrates no definite main duct dilatation. Spleen: No splenomegaly. No suspicious focal mass lesion. Adrenals/Urinary Tract:  Bilateral adrenal gland thickening with assessment limited by motion degradation. Both kidneys are markedly motion degraded. Within this limitation there is evidence for mild left hydroureteronephrosis. Large fluid-filled structure in the central pelvis is presumably a markedly dilated ileal reservoir/Kock pouch. Fluid extends in the pouch through the rectus musculature but then abruptly narrows at the level of mid subcutaneous fat proximal to the stoma of the ileal diversion. Stomach/Bowel: Stomach is unremarkable. No gastric wall thickening. No evidence of outlet obstruction. Duodenum obscured by motion artifact. No small bowel dilatation although assessment is markedly degraded. Colon is not clearly visualized due to motion artifact although no colonic dilatation evident. Left colonic diverticulosis without diverticulitis. Vascular/Lymphatic: There is moderate atherosclerotic calcification of the abdominal aorta without aneurysm. Portions of the abdominal aorta are obscured. No abdominal lymphadenopathy. No pelvic lymphadenopathy. Reproductive: Hysterectomy.  There is no adnexal mass. Other: No intraperitoneal free fluid. Musculoskeletal: Small bilateral groin hernias contain only fat. No worrisome lytic or sclerotic osseous abnormality. IMPRESSION: 1. Markedly motion degraded study. 2. Large fluid-filled structure in the central pelvis compatible with a markedly dilated ileal reservoir/Kock pouch. Fluid distended ileal diversion extends through the rectus musculature but then abruptly narrows at the level of mid subcutaneous fat proximal to the stoma of the ileal diversion. As such, stricture/obstruction of the ileal diversion  is a distinct consideration. 3. Mild left hydroureteronephrosis. 4. Left colonic diverticulosis without diverticulitis. 5. Small bilateral groin hernias contain only fat. 6. Aortic atherosclerosis. Electronically Signed   By: Donnal Fusi M.D.   On: 07/21/2023 11:17   DG Chest Portable 1 View Result Date: 07/21/2023 CLINICAL DATA:  Altered mental status EXAM: PORTABLE CHEST 1 VIEW COMPARISON:  03/15/2023 FINDINGS: Normal cardiac silhouette. Chronic elevation RIGHT hemidiaphragm. No effusion, infiltrate, or pneumothorax. No acute osseous abnormality. IMPRESSION: 1. No acute cardiopulmonary process. 2. Chronic elevation RIGHT hemidiaphragm. Electronically Signed   By: Deboraha Fallow M.D.   On: 07/21/2023 11:06    Procedures Procedures    Medications Ordered in ED Medications  lactated ringers bolus 1,000 mL (0 mLs Intravenous Stopped 07/21/23 1302)  fentaNYL (SUBLIMAZE) injection 50 mcg (50 mcg Intravenous Given 07/21/23 1033)  diphenhydrAMINE (BENADRYL) injection 25 mg (25 mg Intravenous Given 07/21/23 1033)  fentaNYL (SUBLIMAZE) injection 50 mcg (50 mcg Intravenous Given 07/21/23 1257)  labetalol (NORMODYNE) injection 5 mg (5 mg Intravenous Given 07/21/23 1257)    ED Course/ Medical Decision Making/ A&P Clinical Course as of 07/21/23 1321  Sun Jul 21, 2023  1037 Labs with AKI, hyperglycemia but no AGAP making DKA unlikely. With low GFR will need CT without contrast. [VK]  1313 CTAP with ileal diversion stricture vs obstruction and large fluid filled Koch pouch. We were unable to pass catheter here. I spoke with [facility] and Laurie [name] was accepted by Dr. Tod Forward for ED to ED transfer for further evaluation. Laurie [name] having increased pain and BP elevated. NPO at this time and will be given additional pain control and IV BP meds. Will have glucose rechecked. [VK]    Clinical Course User Index [VK] Kingsley, Kore Madlock K, DO                                 Medical Decision Making This  Laurie [name] presents to the ED with chief complaint(s) of altered mental status, abdominal pain with pertinent past medical history of interstitial cystitis status post cystectomy with ileal conduit and Regena Cap pouch with recent scope of ileostomy,  hypertension, diabetes, CAD, OSA, asthma which further complicates the presenting complaint. The complaint involves an extensive differential diagnosis and also carries with it a high risk of complications and morbidity.    The differential diagnosis includes hyperglycemic crisis, DKA, HHS, obstruction, postop complication, perforation, infection, sepsis, electrolyte abnormality  Additional history obtained: Additional history obtained from EMS  Records reviewed Care Everywhere/External Records  ED Course and Reassessment: On Laurie [name]'s arrival she is hypertensive and tachycardic, writhing around in the bed in pain.  Laurie [name] will have workup performed to evaluate for etiology of her altered mental status and significant abdominal pain.  She was given pain control and was started on fluids for her hyperglycemia and will be closely reassessed.  Independent labs interpretation:  The following labs were independently interpreted: AKI on CKD, hyperglycemia without DKA, leukocytosis  Independent visualization of imaging: - I independently visualized the following imaging with scope of interpretation limited to determining acute life threatening conditions related to emergency care: CXR, CTAP, which revealed suspected obstruction vs stricture of ileal diversion with large fluid filled pouch  Consultation: - Consulted or discussed management/test interpretation w/ external professional: [facility]  Consideration for admission or further workup: Laurie [name] requires transfer for urologic evaluation Social Determinants of health: N/A    Amount and/or Complexity of Data Reviewed Labs: ordered. Radiology: ordered.  Risk Prescription drug  management.          Final Clinical Impression(s) / ED Diagnoses Final diagnoses:  AKI (acute kidney injury) (HCC)  Hydronephrosis, unspecified hydronephrosis type  Hyperglycemia    Rx / DC Orders ED Discharge Orders     None         Kingsley, Hephzibah Strehle K, DO 07/21/23 1321

## 2023-07-21 NOTE — ED Triage Notes (Signed)
 Patient BIB EMS from home c/o AMS this morning. Per report worsening AMS and Abdominal pain.  Patient recently DC last Friday at [facility] for Transurethral resection of bladder. Per family Stoma is more swollen and possible infected.   BP 160/104 HR 120 RR 30 O2sat 100% CBG 414

## 2023-07-21 NOTE — ED Notes (Signed)
 I gave critical I Stat Lactic results to MD Nora Beal

## 2023-07-26 LAB — CULTURE, BLOOD (ROUTINE X 2)
Culture: NO GROWTH
Culture: NO GROWTH
